# Patient Record
Sex: Female | Born: 1935 | Race: Black or African American | Hispanic: No | Smoking: Never smoker
Health system: Southern US, Community
[De-identification: ages and names within clinical notes are randomized; demographics above are authoritative.]

## PROBLEM LIST (undated history)

## (undated) ENCOUNTER — Encounter

## (undated) ENCOUNTER — Ambulatory Visit: Payer: MEDICARE

## (undated) ENCOUNTER — Telehealth: Attending: Critical Care Medicine | Primary: Critical Care Medicine

## (undated) ENCOUNTER — Ambulatory Visit: Payer: MEDICARE | Attending: Audiologist | Primary: Audiologist

## (undated) ENCOUNTER — Ambulatory Visit

## (undated) ENCOUNTER — Encounter: Attending: Family Medicine | Primary: Family Medicine

## (undated) ENCOUNTER — Telehealth: Attending: Hematology & Oncology | Primary: Hematology & Oncology

## (undated) ENCOUNTER — Encounter
Attending: Pharmacist Clinician (PhC)/ Clinical Pharmacy Specialist | Primary: Pharmacist Clinician (PhC)/ Clinical Pharmacy Specialist

## (undated) ENCOUNTER — Ambulatory Visit: Payer: MEDICARE | Attending: Hematology & Oncology | Primary: Hematology & Oncology

## (undated) ENCOUNTER — Ambulatory Visit: Payer: MEDICARE | Attending: Clinical | Primary: Clinical

## (undated) ENCOUNTER — Telehealth: Attending: Geriatric Medicine | Primary: Geriatric Medicine

## (undated) ENCOUNTER — Telehealth: Attending: Research Study | Primary: Research Study

## (undated) ENCOUNTER — Ambulatory Visit: Payer: MEDICARE | Attending: Geriatric Medicine | Primary: Geriatric Medicine

## (undated) ENCOUNTER — Telehealth

## (undated) ENCOUNTER — Ambulatory Visit: Payer: MEDICARE | Attending: Family Medicine | Primary: Family Medicine

## (undated) ENCOUNTER — Telehealth
Attending: Pharmacist Clinician (PhC)/ Clinical Pharmacy Specialist | Primary: Pharmacist Clinician (PhC)/ Clinical Pharmacy Specialist

## (undated) ENCOUNTER — Ambulatory Visit: Payer: MEDICARE | Attending: Speech-Language Pathologist | Primary: Speech-Language Pathologist

## (undated) ENCOUNTER — Encounter: Attending: Hematology & Oncology | Primary: Hematology & Oncology

## (undated) ENCOUNTER — Encounter: Attending: Pulmonary Disease | Primary: Pulmonary Disease

## (undated) ENCOUNTER — Encounter: Attending: Geriatric Medicine | Primary: Geriatric Medicine

## (undated) ENCOUNTER — Ambulatory Visit: Payer: MEDICARE | Attending: Otolaryngology | Primary: Otolaryngology

## (undated) ENCOUNTER — Ambulatory Visit: Payer: MEDICARE | Attending: Registered" | Primary: Registered"

## (undated) ENCOUNTER — Telehealth: Attending: Family Medicine | Primary: Family Medicine

## (undated) ENCOUNTER — Telehealth: Attending: Internal Medicine | Primary: Internal Medicine

## (undated) ENCOUNTER — Ambulatory Visit: Attending: Clinical | Primary: Clinical

## (undated) ENCOUNTER — Ambulatory Visit: Payer: MEDICARE | Attending: Nurse Practitioner | Primary: Nurse Practitioner

## (undated) ENCOUNTER — Telehealth
Attending: Student in an Organized Health Care Education/Training Program | Primary: Student in an Organized Health Care Education/Training Program

## (undated) ENCOUNTER — Encounter: Attending: Critical Care Medicine | Primary: Critical Care Medicine

## (undated) ENCOUNTER — Inpatient Hospital Stay

## (undated) ENCOUNTER — Ambulatory Visit
Payer: MEDICARE | Attending: Student in an Organized Health Care Education/Training Program | Primary: Student in an Organized Health Care Education/Training Program

## (undated) ENCOUNTER — Telehealth: Attending: Urology | Primary: Urology

## (undated) ENCOUNTER — Telehealth: Attending: Pulmonary Disease | Primary: Pulmonary Disease

## (undated) DIAGNOSIS — E119 Type 2 diabetes mellitus without complications: Secondary | ICD-10-CM

## (undated) DIAGNOSIS — I10 Essential (primary) hypertension: Secondary | ICD-10-CM

## (undated) DIAGNOSIS — N63 Unspecified lump in unspecified breast: Secondary | ICD-10-CM

## (undated) DIAGNOSIS — E538 Deficiency of other specified B group vitamins: Secondary | ICD-10-CM

## (undated) HISTORY — PX: CYST REMOVAL NECK: SHX6281

## (undated) HISTORY — PX: CHOLECYSTECTOMY: SHX55

## (undated) HISTORY — DX: Deficiency of other specified B group vitamins: E53.8

## (undated) HISTORY — DX: Unspecified lump in unspecified breast: N63.0

## (undated) HISTORY — DX: Essential (primary) hypertension: I10

## (undated) HISTORY — DX: Type 2 diabetes mellitus without complications: E11.9

## (undated) HISTORY — PX: ABDOMINAL HYSTERECTOMY: SHX81

---
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
## 2011-10-17 DIAGNOSIS — E119 Type 2 diabetes mellitus without complications: Secondary | ICD-10-CM | POA: Diagnosis not present

## 2011-10-17 DIAGNOSIS — I10 Essential (primary) hypertension: Secondary | ICD-10-CM | POA: Diagnosis not present

## 2011-11-14 DIAGNOSIS — E119 Type 2 diabetes mellitus without complications: Secondary | ICD-10-CM | POA: Diagnosis not present

## 2011-11-14 DIAGNOSIS — I10 Essential (primary) hypertension: Secondary | ICD-10-CM | POA: Diagnosis not present

## 2011-12-20 DIAGNOSIS — R05 Cough: Secondary | ICD-10-CM | POA: Diagnosis not present

## 2012-05-09 DIAGNOSIS — R002 Palpitations: Secondary | ICD-10-CM | POA: Diagnosis not present

## 2012-05-09 LAB — BASIC METABOLIC PANEL
BUN: 18 mg/dL (ref 4–21)
CREATININE: 0.7 mg/dL (ref <=1.1)

## 2012-08-05 ENCOUNTER — Emergency Department: Payer: Self-pay | Admitting: Emergency Medicine

## 2012-08-05 DIAGNOSIS — I10 Essential (primary) hypertension: Secondary | ICD-10-CM | POA: Diagnosis not present

## 2012-08-05 DIAGNOSIS — E119 Type 2 diabetes mellitus without complications: Secondary | ICD-10-CM | POA: Diagnosis not present

## 2012-08-05 DIAGNOSIS — S6980XA Other specified injuries of unspecified wrist, hand and finger(s), initial encounter: Secondary | ICD-10-CM | POA: Diagnosis not present

## 2012-08-05 DIAGNOSIS — L089 Local infection of the skin and subcutaneous tissue, unspecified: Secondary | ICD-10-CM | POA: Diagnosis not present

## 2012-08-05 DIAGNOSIS — M79609 Pain in unspecified limb: Secondary | ICD-10-CM | POA: Diagnosis not present

## 2012-12-05 DIAGNOSIS — R634 Abnormal weight loss: Secondary | ICD-10-CM | POA: Diagnosis not present

## 2012-12-05 DIAGNOSIS — E119 Type 2 diabetes mellitus without complications: Secondary | ICD-10-CM | POA: Diagnosis not present

## 2012-12-05 DIAGNOSIS — I10 Essential (primary) hypertension: Secondary | ICD-10-CM | POA: Diagnosis not present

## 2012-12-11 DIAGNOSIS — Z23 Encounter for immunization: Secondary | ICD-10-CM | POA: Diagnosis not present

## 2012-12-21 DIAGNOSIS — L509 Urticaria, unspecified: Secondary | ICD-10-CM | POA: Diagnosis not present

## 2013-01-30 DIAGNOSIS — T148 Other injury of unspecified body region: Secondary | ICD-10-CM | POA: Diagnosis not present

## 2013-04-01 DIAGNOSIS — IMO0001 Reserved for inherently not codable concepts without codable children: Secondary | ICD-10-CM | POA: Diagnosis not present

## 2013-07-08 DIAGNOSIS — I10 Essential (primary) hypertension: Secondary | ICD-10-CM | POA: Diagnosis not present

## 2013-07-08 DIAGNOSIS — R634 Abnormal weight loss: Secondary | ICD-10-CM | POA: Diagnosis not present

## 2013-07-08 DIAGNOSIS — IMO0001 Reserved for inherently not codable concepts without codable children: Secondary | ICD-10-CM | POA: Diagnosis not present

## 2013-07-08 LAB — HEMOGLOBIN A1C: Hgb A1c MFr Bld: 8.1 % — AB (ref 4.0–6.0)

## 2013-12-03 DIAGNOSIS — N63 Unspecified lump in breast: Secondary | ICD-10-CM | POA: Diagnosis not present

## 2013-12-03 DIAGNOSIS — D171 Benign lipomatous neoplasm of skin and subcutaneous tissue of trunk: Secondary | ICD-10-CM | POA: Diagnosis not present

## 2013-12-03 DIAGNOSIS — Z0000 Encounter for general adult medical examination without abnormal findings: Secondary | ICD-10-CM | POA: Diagnosis not present

## 2013-12-03 DIAGNOSIS — L729 Follicular cyst of the skin and subcutaneous tissue, unspecified: Secondary | ICD-10-CM | POA: Diagnosis not present

## 2013-12-03 DIAGNOSIS — D510 Vitamin B12 deficiency anemia due to intrinsic factor deficiency: Secondary | ICD-10-CM | POA: Diagnosis not present

## 2013-12-03 DIAGNOSIS — E1165 Type 2 diabetes mellitus with hyperglycemia: Secondary | ICD-10-CM | POA: Diagnosis not present

## 2013-12-03 DIAGNOSIS — I10 Essential (primary) hypertension: Secondary | ICD-10-CM | POA: Diagnosis not present

## 2013-12-03 DIAGNOSIS — Z79899 Other long term (current) drug therapy: Secondary | ICD-10-CM | POA: Diagnosis not present

## 2014-01-03 DIAGNOSIS — E1165 Type 2 diabetes mellitus with hyperglycemia: Secondary | ICD-10-CM | POA: Diagnosis not present

## 2014-01-03 DIAGNOSIS — R634 Abnormal weight loss: Secondary | ICD-10-CM | POA: Diagnosis not present

## 2014-01-03 DIAGNOSIS — E119 Type 2 diabetes mellitus without complications: Secondary | ICD-10-CM | POA: Insufficient documentation

## 2014-01-31 DIAGNOSIS — E119 Type 2 diabetes mellitus without complications: Secondary | ICD-10-CM | POA: Diagnosis not present

## 2014-01-31 DIAGNOSIS — E11649 Type 2 diabetes mellitus with hypoglycemia without coma: Secondary | ICD-10-CM | POA: Diagnosis not present

## 2014-01-31 DIAGNOSIS — E876 Hypokalemia: Secondary | ICD-10-CM | POA: Diagnosis not present

## 2014-01-31 DIAGNOSIS — R634 Abnormal weight loss: Secondary | ICD-10-CM | POA: Diagnosis not present

## 2014-03-27 DIAGNOSIS — Z794 Long term (current) use of insulin: Secondary | ICD-10-CM | POA: Diagnosis not present

## 2014-03-27 DIAGNOSIS — E119 Type 2 diabetes mellitus without complications: Secondary | ICD-10-CM | POA: Diagnosis not present

## 2014-04-04 DIAGNOSIS — E11649 Type 2 diabetes mellitus with hypoglycemia without coma: Secondary | ICD-10-CM | POA: Diagnosis not present

## 2014-04-04 DIAGNOSIS — R634 Abnormal weight loss: Secondary | ICD-10-CM | POA: Diagnosis not present

## 2014-04-04 DIAGNOSIS — E119 Type 2 diabetes mellitus without complications: Secondary | ICD-10-CM | POA: Diagnosis not present

## 2014-04-04 DIAGNOSIS — E876 Hypokalemia: Secondary | ICD-10-CM | POA: Diagnosis not present

## 2014-04-17 DIAGNOSIS — M25561 Pain in right knee: Secondary | ICD-10-CM | POA: Diagnosis not present

## 2014-04-17 DIAGNOSIS — M1711 Unilateral primary osteoarthritis, right knee: Secondary | ICD-10-CM | POA: Diagnosis not present

## 2014-05-05 ENCOUNTER — Ambulatory Visit: Payer: Self-pay | Admitting: Internal Medicine

## 2014-05-05 DIAGNOSIS — N63 Unspecified lump in breast: Secondary | ICD-10-CM | POA: Diagnosis not present

## 2014-05-08 DIAGNOSIS — Z794 Long term (current) use of insulin: Secondary | ICD-10-CM | POA: Diagnosis not present

## 2014-05-08 DIAGNOSIS — E876 Hypokalemia: Secondary | ICD-10-CM | POA: Diagnosis not present

## 2014-05-08 DIAGNOSIS — R634 Abnormal weight loss: Secondary | ICD-10-CM | POA: Diagnosis not present

## 2014-05-08 DIAGNOSIS — E1165 Type 2 diabetes mellitus with hyperglycemia: Secondary | ICD-10-CM | POA: Diagnosis not present

## 2014-06-12 DIAGNOSIS — E1165 Type 2 diabetes mellitus with hyperglycemia: Secondary | ICD-10-CM | POA: Diagnosis not present

## 2014-07-09 DIAGNOSIS — E1165 Type 2 diabetes mellitus with hyperglycemia: Secondary | ICD-10-CM | POA: Diagnosis not present

## 2014-07-09 DIAGNOSIS — Z794 Long term (current) use of insulin: Secondary | ICD-10-CM | POA: Diagnosis not present

## 2014-07-09 DIAGNOSIS — E876 Hypokalemia: Secondary | ICD-10-CM | POA: Diagnosis not present

## 2014-07-09 DIAGNOSIS — Z79899 Other long term (current) drug therapy: Secondary | ICD-10-CM | POA: Diagnosis not present

## 2014-09-29 ENCOUNTER — Other Ambulatory Visit: Payer: Self-pay | Admitting: Internal Medicine

## 2014-10-02 DIAGNOSIS — E1165 Type 2 diabetes mellitus with hyperglycemia: Secondary | ICD-10-CM | POA: Diagnosis not present

## 2014-10-02 DIAGNOSIS — R634 Abnormal weight loss: Secondary | ICD-10-CM | POA: Diagnosis not present

## 2014-10-07 DIAGNOSIS — I10 Essential (primary) hypertension: Secondary | ICD-10-CM | POA: Diagnosis not present

## 2014-10-07 DIAGNOSIS — R634 Abnormal weight loss: Secondary | ICD-10-CM | POA: Diagnosis not present

## 2014-10-07 DIAGNOSIS — E1165 Type 2 diabetes mellitus with hyperglycemia: Secondary | ICD-10-CM | POA: Diagnosis not present

## 2014-10-07 DIAGNOSIS — Z794 Long term (current) use of insulin: Secondary | ICD-10-CM | POA: Diagnosis not present

## 2014-11-25 DIAGNOSIS — I10 Essential (primary) hypertension: Secondary | ICD-10-CM | POA: Diagnosis not present

## 2014-11-25 DIAGNOSIS — E1165 Type 2 diabetes mellitus with hyperglycemia: Secondary | ICD-10-CM | POA: Diagnosis not present

## 2014-11-25 DIAGNOSIS — R634 Abnormal weight loss: Secondary | ICD-10-CM | POA: Diagnosis not present

## 2014-11-25 DIAGNOSIS — Z794 Long term (current) use of insulin: Secondary | ICD-10-CM | POA: Diagnosis not present

## 2014-11-29 IMAGING — CR RIGHT THUMB 2+V
1 series · 3 of 3 positions shown · non-contrast
Comparison: none

REASON FOR EXAM: pain in thumb; possible Kethellen Cocco in thumb 2 weeks
ago
COMMENTS:

PROCEDURE:     DXR - DXR THUMB RIGHT HAND (1ST DIGIT)  - August 05, 2012 [DATE]
RESULT:     Comparison:  None

[Series 1: x finger pa right · 0.14mm/px · 3 of 3 slices shown]
[im 1/3]
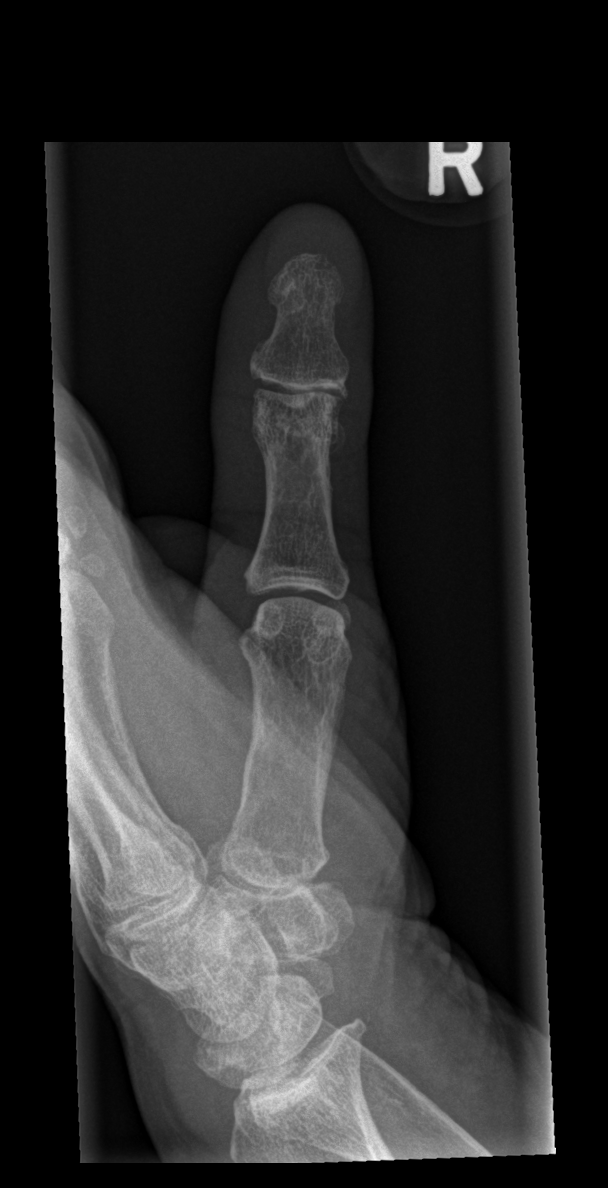
[im 2/3]
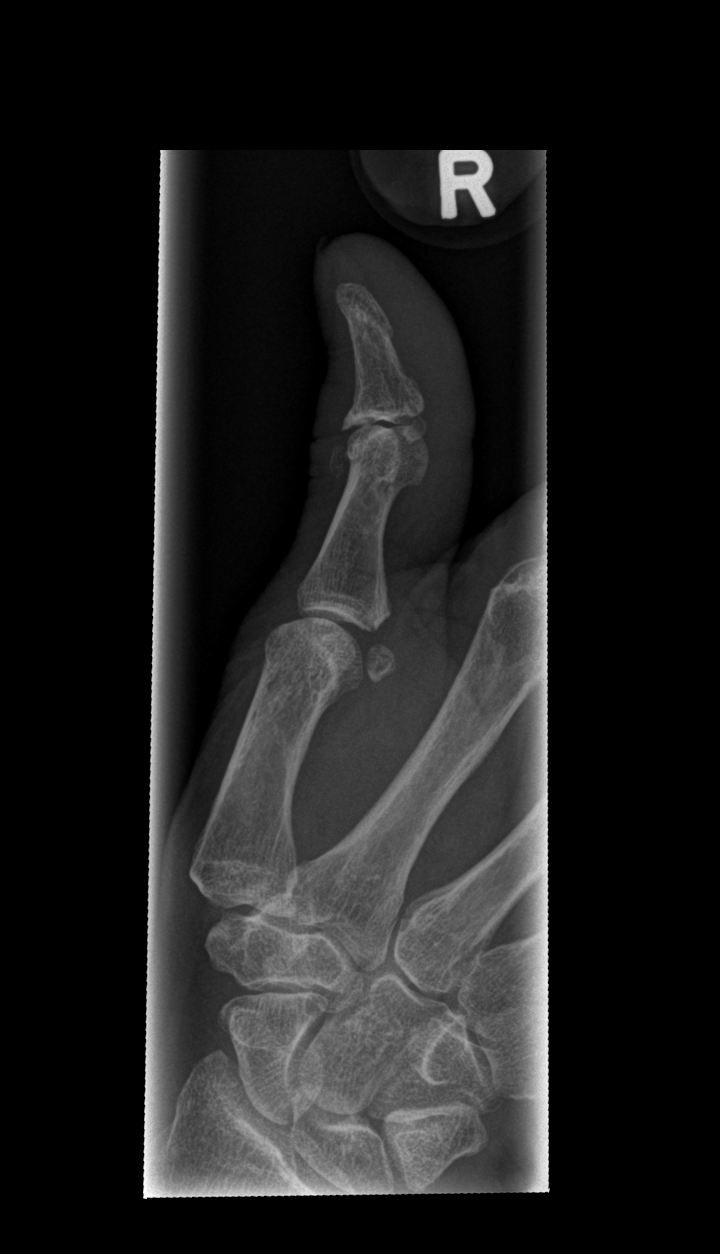
[im 3/3]
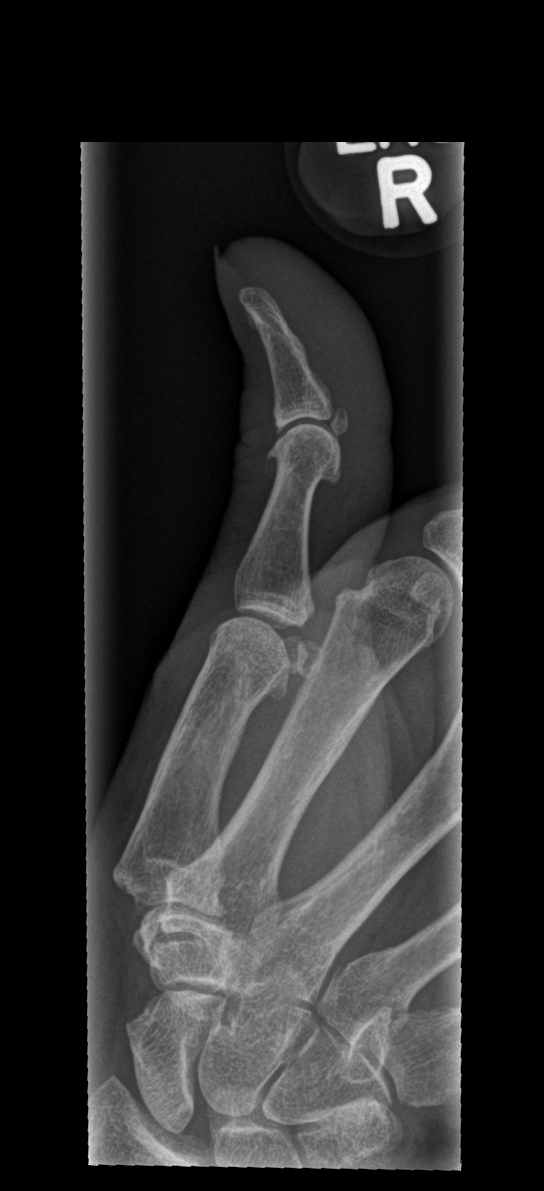

[3 of 3 positions shown; findings below may reference images not displayed]

FINDINGS: Three coned-down views of the right thumb demonstrates no fracture or
dislocation. The soft tissues are normal. There are degenerative changes of
the IP joint.
IMPRESSION: No acute osseous injury of the right.

[REDACTED]

## 2014-12-16 DIAGNOSIS — E1165 Type 2 diabetes mellitus with hyperglycemia: Secondary | ICD-10-CM | POA: Diagnosis not present

## 2014-12-16 DIAGNOSIS — I10 Essential (primary) hypertension: Secondary | ICD-10-CM | POA: Diagnosis not present

## 2014-12-16 DIAGNOSIS — E876 Hypokalemia: Secondary | ICD-10-CM | POA: Diagnosis not present

## 2014-12-16 DIAGNOSIS — Z794 Long term (current) use of insulin: Secondary | ICD-10-CM | POA: Diagnosis not present

## 2014-12-16 DIAGNOSIS — Z79899 Other long term (current) drug therapy: Secondary | ICD-10-CM | POA: Diagnosis not present

## 2014-12-16 DIAGNOSIS — R634 Abnormal weight loss: Secondary | ICD-10-CM | POA: Diagnosis not present

## 2014-12-22 ENCOUNTER — Other Ambulatory Visit: Payer: Self-pay | Admitting: Internal Medicine

## 2014-12-22 ENCOUNTER — Encounter: Payer: Self-pay | Admitting: Internal Medicine

## 2014-12-22 DIAGNOSIS — I10 Essential (primary) hypertension: Secondary | ICD-10-CM | POA: Insufficient documentation

## 2014-12-22 DIAGNOSIS — D510 Vitamin B12 deficiency anemia due to intrinsic factor deficiency: Secondary | ICD-10-CM | POA: Insufficient documentation

## 2014-12-22 DIAGNOSIS — D171 Benign lipomatous neoplasm of skin and subcutaneous tissue of trunk: Secondary | ICD-10-CM | POA: Insufficient documentation

## 2014-12-22 DIAGNOSIS — E1165 Type 2 diabetes mellitus with hyperglycemia: Secondary | ICD-10-CM | POA: Insufficient documentation

## 2014-12-22 DIAGNOSIS — N6310 Unspecified lump in the right breast, unspecified quadrant: Secondary | ICD-10-CM | POA: Insufficient documentation

## 2014-12-26 ENCOUNTER — Other Ambulatory Visit: Payer: Self-pay

## 2014-12-31 ENCOUNTER — Ambulatory Visit (INDEPENDENT_AMBULATORY_CARE_PROVIDER_SITE_OTHER): Payer: Medicare Other | Admitting: Internal Medicine

## 2014-12-31 ENCOUNTER — Encounter: Payer: Self-pay | Admitting: Internal Medicine

## 2014-12-31 ENCOUNTER — Other Ambulatory Visit: Payer: Self-pay

## 2014-12-31 VITALS — BP 158/80 | HR 80 | Ht 64.0 in | Wt 131.6 lb

## 2014-12-31 DIAGNOSIS — E1165 Type 2 diabetes mellitus with hyperglycemia: Secondary | ICD-10-CM | POA: Diagnosis not present

## 2014-12-31 DIAGNOSIS — Z794 Long term (current) use of insulin: Secondary | ICD-10-CM

## 2014-12-31 DIAGNOSIS — N63 Unspecified lump in unspecified breast: Secondary | ICD-10-CM

## 2014-12-31 DIAGNOSIS — I10 Essential (primary) hypertension: Secondary | ICD-10-CM

## 2014-12-31 MED ORDER — LOSARTAN POTASSIUM-HCTZ 100-25 MG PO TABS: 1.0000 | ORAL_TABLET | Freq: Every day | ORAL | Status: DC

## 2014-12-31 NOTE — Progress Notes (Signed)
Date:  12/31/2014   Name:  Victoria Alexander   DOB:  [birth year]   MRN:  [id number]   Chief Complaint: Diabetes and Hypertension Diabetes She presents for her follow-up (Patient is being followed by endocrinology Dr. Eddie Dibbles.) diabetic visit. She has type 2 diabetes mellitus. Her disease course has been fluctuating. Pertinent negatives for hypoglycemia include no confusion (family states that her mind is sharp) or headaches. Pertinent negatives for diabetes include no chest pain and no fatigue. There are no hypoglycemic complications. Current diabetic treatments: Insulin dose changed to 70/30 twice a day with no oral agents. She is compliant with treatment most of the time (Patient is slightly confused about her therapy. When asked if she takes insulin twice a day she says yes.). Her weight is stable. Her home blood glucose trend is increasing steadily. An ACE inhibitor/angiotensin II receptor blocker is being taken.  Hypertension This is a chronic problem. The current episode started more than 1 year ago. The problem is unchanged. The problem is controlled. Pertinent negatives include no chest pain, headaches, palpitations or shortness of breath. Risk factors for coronary artery disease include diabetes mellitus and dyslipidemia. Past treatments include angiotensin blockers, diuretics and calcium channel blockers. The current treatment provides moderate improvement. Compliance problems: Patient is uncertain what medication she takes. Several family members help monitor her medications so it's unclear if there is a consistent person following up.    Breast mass -Patient was noted to have a right breast mass last year. She finally had a mammogram in March 2016. At that time they will requested additional views with ultrasound and possible biopsy. However the patient left without having the procedure performed. She states that she is not worried about the mass. She states that the mass has not changed. She  denies pain, skin change or other problems in that area. She is adamant that she does not want further evaluation at this time.    Review of Systems  Constitutional: Negative for fever and fatigue.  HENT: Negative for hearing loss, tinnitus and trouble swallowing.   Eyes: Negative for visual disturbance.  Respiratory: Negative for cough, chest tightness and shortness of breath.   Cardiovascular: Negative for chest pain, palpitations and leg swelling.  Gastrointestinal: Negative for abdominal pain, diarrhea and constipation.  Genitourinary: Negative for dysuria and hematuria.       Persistent mass in the right upper breast reportedly unchanged  Musculoskeletal: Negative for arthralgias.  Neurological: Negative for headaches.  Hematological: Negative for adenopathy.  Psychiatric/Behavioral: Negative for confusion (family states that her mind is sharp) and dysphoric mood.    Patient Active Problem List   Diagnosis Date Noted  . Addison anemia 12/22/2014  . Breast lump 12/22/2014  . Type 2 diabetes mellitus with hyperglycemia ([place]) 12/22/2014  . Essential (primary) hypertension 12/22/2014  . Lipoma of back 12/22/2014    Prior to Admission medications   Medication Sig Start Date End Date Taking? Authorizing Provider  amLODipine (NORVASC) 2.5 MG tablet TAKE 1 TABLET (2.5 MG TOTAL) BY MOUTH ONCE DAILY. 12/17/14  Yes Historical Provider, MD  B-D ULTRAFINE III SHORT PEN 31G X 8 MM MISC USE AS DIRECTED. USE 4 NEEDLES A DAY WITH DIABETIC MEDICATION PENS AS DIRECTED 10/07/14  Yes Historical Provider, MD  glimepiride (AMARYL) 2 MG tablet Take 1 tablet by mouth 2 (two) times daily. 12/03/13  Yes Historical Provider, MD  GLUCOSE BLOOD VI  03/01/14  Yes Historical Provider, MD  Insulin Detemir (LEVEMIR FLEXTOUCH) 100 UNIT/ML Pen Inject 12  Units into the skin daily. 07/16/14  Yes Historical Provider, MD  aspirin 81 MG chewable tablet Chew 1 tablet by mouth daily.    Historical Provider, MD   losartan-hydrochlorothiazide (HYZAAR) 100-25 MG tablet TAKE 1 TABLET EVERY DAY 12/22/14   Glean Hess, MD  NOVOLOG MIX 70/30 FLEXPEN (70-30) 100 UNIT/ML FlexPen TAKE 10 UNITS 30 MINUTES BEFORE BREAKFAST AND 8 UNITS 30 MINUTES BEFORE SUPPER. 12/16/14   Historical Provider, MD  potassium chloride (K-DUR,KLOR-CON) 10 MEQ tablet Take 2 tablets by mouth daily. 05/09/12   Historical Provider, MD    Allergies  Allergen Reactions  . Sulfa Antibiotics Nausea Only    Past Surgical History  Procedure Laterality Date  . Abdominal hysterectomy    . Cholecystectomy      Social History  Substance Use Topics  . Smoking status: Never Smoker   . Smokeless tobacco: None  . Alcohol Use: No     Medication list has been reviewed and updated.   Physical Exam  Constitutional: She is oriented to person, place, and time. She appears well-developed and well-nourished.  Neck: Normal range of motion. Neck supple. No thyromegaly present.  Cardiovascular: Normal rate, regular rhythm and normal heart sounds.   Pulmonary/Chest: Effort normal and breath sounds normal. She has no wheezes. She has no rales.  Right breast mass at 11:00. Firm, well-circumscribed and nontender. Proximately 1.5 cm in diameter.   Abdominal: Soft. Bowel sounds are normal. She exhibits no mass. There is no tenderness.  Musculoskeletal: She exhibits no edema or tenderness.  Neurological: She is alert and oriented to person, place, and time.  Psychiatric: She has a normal mood and affect.  Nursing note and vitals reviewed.   BP 176/76 mmHg  Pulse 80  Ht '5\' 4"'$  (1.626 m)  Wt 131 lb 9.6 oz (59.693 kg)  BMI 22.58 kg/m2  Assessment and Plan: 1. Essential (primary) hypertension Only fair control but question medication compliance Enforced need to take both medications every day - TSH - CBC with Differential/Platelet  2. Breast lump Patient continues to decline further evaluation of her breast mass  3. Type 2 diabetes  mellitus with hyperglycemia, with long-term current use of insulin ([place]) Followed by endocrinology Continue insulin twice a day as recommended - Comprehensive metabolic panel   Halina Maidens, MD [facility]  12/31/2014

## 2015-01-01 ENCOUNTER — Other Ambulatory Visit: Payer: Self-pay | Admitting: Internal Medicine

## 2015-01-01 LAB — COMPREHENSIVE METABOLIC PANEL
A/G RATIO: 1.6 (ref 1.1–2.5)
ALT: 7 IU/L (ref 0–32)
AST: 14 IU/L (ref 0–40)
Albumin: 4.2 g/dL (ref 3.5–4.7)
Alkaline Phosphatase: 71 IU/L (ref 39–117)
BUN/Creatinine Ratio: 21 (ref 11–26)
BUN: 15 mg/dL (ref 8–27)
Bilirubin Total: 1.1 mg/dL (ref 0.0–1.2)
CALCIUM: 9.9 mg/dL (ref 8.7–10.3)
CO2: 32 mmol/L — AB (ref 18–29)
CREATININE: 0.72 mg/dL (ref 0.57–1.00)
Chloride: 95 mmol/L — ABNORMAL LOW (ref 97–106)
GFR, EST AFRICAN AMERICAN: 91 mL/min/{1.73_m2} (ref >59)
GFR, EST NON AFRICAN AMERICAN: 79 mL/min/{1.73_m2} (ref >59)
GLUCOSE: 347 mg/dL — AB (ref 65–99)
Globulin, Total: 2.6 g/dL (ref 1.5–4.5)
Potassium: 3.1 mmol/L — ABNORMAL LOW (ref 3.5–5.2)
Sodium: 140 mmol/L (ref 136–144)
TOTAL PROTEIN: 6.8 g/dL (ref 6.0–8.5)

## 2015-01-01 LAB — CBC WITH DIFFERENTIAL/PLATELET
BASOS: 1 %
Basophils Absolute: 0 10*3/uL (ref 0.0–0.2)
EOS (ABSOLUTE): 0.2 10*3/uL (ref 0.0–0.4)
Eos: 3 %
Hematocrit: 33.8 % — ABNORMAL LOW (ref 34.0–46.6)
Hemoglobin: 11.5 g/dL (ref 11.1–15.9)
IMMATURE GRANS (ABS): 0 10*3/uL (ref 0.0–0.1)
IMMATURE GRANULOCYTES: 0 %
LYMPHS: 28 %
Lymphocytes Absolute: 1.8 10*3/uL (ref 0.7–3.1)
MCH: 23.1 pg — ABNORMAL LOW (ref 26.6–33.0)
MCHC: 34 g/dL (ref 31.5–35.7)
MCV: 68 fL — AB (ref 79–97)
Monocytes Absolute: 0.1 10*3/uL (ref 0.1–0.9)
Monocytes: 1 %
NEUTROS PCT: 67 %
Neutrophils Absolute: 4.4 10*3/uL (ref 1.4–7.0)
PLATELETS: 165 10*3/uL (ref 150–379)
RBC: 4.97 x10E6/uL (ref 3.77–5.28)
RDW: 18.6 % — AB (ref 12.3–15.4)
WBC: 6.4 10*3/uL (ref 3.4–10.8)

## 2015-01-01 LAB — TSH: TSH: 3.65 u[IU]/mL (ref 0.450–4.500)

## 2015-01-01 MED ORDER — POTASSIUM CHLORIDE CRYS ER 20 MEQ PO TBCR: 20.0000 meq | EXTENDED_RELEASE_TABLET | Freq: Every day | ORAL | Status: DC

## 2015-01-12 ENCOUNTER — Other Ambulatory Visit: Payer: Self-pay | Admitting: Internal Medicine

## 2015-02-05 DIAGNOSIS — Z794 Long term (current) use of insulin: Secondary | ICD-10-CM | POA: Diagnosis not present

## 2015-02-05 DIAGNOSIS — E1165 Type 2 diabetes mellitus with hyperglycemia: Secondary | ICD-10-CM | POA: Diagnosis not present

## 2015-02-13 DIAGNOSIS — E119 Type 2 diabetes mellitus without complications: Secondary | ICD-10-CM | POA: Diagnosis not present

## 2015-02-24 ENCOUNTER — Encounter: Payer: Self-pay | Admitting: Internal Medicine

## 2015-04-02 DIAGNOSIS — E876 Hypokalemia: Secondary | ICD-10-CM | POA: Diagnosis not present

## 2015-04-02 DIAGNOSIS — E1165 Type 2 diabetes mellitus with hyperglycemia: Secondary | ICD-10-CM | POA: Diagnosis not present

## 2015-04-02 DIAGNOSIS — I10 Essential (primary) hypertension: Secondary | ICD-10-CM | POA: Diagnosis not present

## 2015-04-02 DIAGNOSIS — Z794 Long term (current) use of insulin: Secondary | ICD-10-CM | POA: Diagnosis not present

## 2015-04-02 DIAGNOSIS — R634 Abnormal weight loss: Secondary | ICD-10-CM | POA: Diagnosis not present

## 2015-04-10 DIAGNOSIS — E1165 Type 2 diabetes mellitus with hyperglycemia: Secondary | ICD-10-CM | POA: Diagnosis not present

## 2015-04-10 DIAGNOSIS — R634 Abnormal weight loss: Secondary | ICD-10-CM | POA: Diagnosis not present

## 2015-04-10 DIAGNOSIS — I10 Essential (primary) hypertension: Secondary | ICD-10-CM | POA: Diagnosis not present

## 2015-04-10 DIAGNOSIS — Z794 Long term (current) use of insulin: Secondary | ICD-10-CM | POA: Diagnosis not present

## 2015-04-14 ENCOUNTER — Encounter: Payer: Medicare Other | Admitting: Internal Medicine

## 2015-04-21 DIAGNOSIS — Z794 Long term (current) use of insulin: Secondary | ICD-10-CM | POA: Diagnosis not present

## 2015-04-21 DIAGNOSIS — E1165 Type 2 diabetes mellitus with hyperglycemia: Secondary | ICD-10-CM | POA: Diagnosis not present

## 2015-04-21 DIAGNOSIS — I10 Essential (primary) hypertension: Secondary | ICD-10-CM | POA: Diagnosis not present

## 2015-05-08 ENCOUNTER — Encounter: Payer: Self-pay | Admitting: *Deleted

## 2015-05-28 ENCOUNTER — Other Ambulatory Visit: Payer: Self-pay

## 2015-05-28 MED ORDER — POTASSIUM CHLORIDE CRYS ER 20 MEQ PO TBCR: 20.0000 meq | EXTENDED_RELEASE_TABLET | Freq: Every day | ORAL | Status: DC

## 2015-05-28 MED ORDER — LOSARTAN POTASSIUM-HCTZ 100-25 MG PO TABS: 1.0000 | ORAL_TABLET | Freq: Every day | ORAL | Status: DC

## 2015-05-28 NOTE — Telephone Encounter (Signed)
Received fax from pharmacy for 90 day supply.

## 2015-05-28 NOTE — Telephone Encounter (Signed)
Pts coming in on 7/5 for cpe

## 2015-06-03 ENCOUNTER — Other Ambulatory Visit: Payer: Self-pay

## 2015-06-03 MED ORDER — LOSARTAN POTASSIUM-HCTZ 100-25 MG PO TABS
1.0000 | ORAL_TABLET | Freq: Every day | ORAL | Status: DC
Start: 2015-06-03 — End: 2015-09-01

## 2015-06-03 MED ORDER — POTASSIUM CHLORIDE CRYS ER 20 MEQ PO TBCR: 20.0000 meq | EXTENDED_RELEASE_TABLET | Freq: Every day | ORAL | Status: DC

## 2015-06-03 NOTE — Telephone Encounter (Signed)
Received fax from pharmacy requesting medication for 90 day supply.

## 2015-07-14 DIAGNOSIS — Z794 Long term (current) use of insulin: Secondary | ICD-10-CM | POA: Diagnosis not present

## 2015-07-14 DIAGNOSIS — E1165 Type 2 diabetes mellitus with hyperglycemia: Secondary | ICD-10-CM | POA: Diagnosis not present

## 2015-07-21 DIAGNOSIS — Z794 Long term (current) use of insulin: Secondary | ICD-10-CM | POA: Diagnosis not present

## 2015-07-21 DIAGNOSIS — E1165 Type 2 diabetes mellitus with hyperglycemia: Secondary | ICD-10-CM | POA: Diagnosis not present

## 2015-07-21 DIAGNOSIS — I10 Essential (primary) hypertension: Secondary | ICD-10-CM | POA: Diagnosis not present

## 2015-07-21 DIAGNOSIS — E876 Hypokalemia: Secondary | ICD-10-CM | POA: Diagnosis not present

## 2015-07-21 DIAGNOSIS — R634 Abnormal weight loss: Secondary | ICD-10-CM | POA: Diagnosis not present

## 2015-07-28 DIAGNOSIS — E876 Hypokalemia: Secondary | ICD-10-CM | POA: Diagnosis not present

## 2015-07-28 DIAGNOSIS — E1165 Type 2 diabetes mellitus with hyperglycemia: Secondary | ICD-10-CM | POA: Diagnosis not present

## 2015-07-28 DIAGNOSIS — E11641 Type 2 diabetes mellitus with hypoglycemia with coma: Secondary | ICD-10-CM | POA: Diagnosis not present

## 2015-07-28 DIAGNOSIS — Z79899 Other long term (current) drug therapy: Secondary | ICD-10-CM | POA: Diagnosis not present

## 2015-07-28 DIAGNOSIS — Z794 Long term (current) use of insulin: Secondary | ICD-10-CM | POA: Diagnosis not present

## 2015-07-28 DIAGNOSIS — I10 Essential (primary) hypertension: Secondary | ICD-10-CM | POA: Diagnosis not present

## 2015-07-28 DIAGNOSIS — N390 Urinary tract infection, site not specified: Secondary | ICD-10-CM | POA: Diagnosis not present

## 2015-07-28 DIAGNOSIS — I4510 Unspecified right bundle-branch block: Secondary | ICD-10-CM | POA: Diagnosis not present

## 2015-07-28 DIAGNOSIS — E162 Hypoglycemia, unspecified: Secondary | ICD-10-CM | POA: Diagnosis not present

## 2015-08-18 DIAGNOSIS — E1165 Type 2 diabetes mellitus with hyperglycemia: Secondary | ICD-10-CM | POA: Diagnosis not present

## 2015-08-18 DIAGNOSIS — I10 Essential (primary) hypertension: Secondary | ICD-10-CM | POA: Diagnosis not present

## 2015-08-18 DIAGNOSIS — E876 Hypokalemia: Secondary | ICD-10-CM | POA: Diagnosis not present

## 2015-08-18 DIAGNOSIS — E11641 Type 2 diabetes mellitus with hypoglycemia with coma: Secondary | ICD-10-CM | POA: Diagnosis not present

## 2015-08-18 DIAGNOSIS — Z794 Long term (current) use of insulin: Secondary | ICD-10-CM | POA: Diagnosis not present

## 2015-09-01 ENCOUNTER — Other Ambulatory Visit: Payer: Self-pay | Admitting: Internal Medicine

## 2015-09-02 ENCOUNTER — Encounter: Payer: Medicare Other | Admitting: Internal Medicine

## 2015-09-08 DIAGNOSIS — E1165 Type 2 diabetes mellitus with hyperglycemia: Secondary | ICD-10-CM | POA: Diagnosis not present

## 2015-09-08 DIAGNOSIS — I10 Essential (primary) hypertension: Secondary | ICD-10-CM | POA: Diagnosis not present

## 2015-09-08 DIAGNOSIS — E876 Hypokalemia: Secondary | ICD-10-CM | POA: Diagnosis not present

## 2015-09-08 DIAGNOSIS — Z794 Long term (current) use of insulin: Secondary | ICD-10-CM | POA: Diagnosis not present

## 2015-09-08 DIAGNOSIS — R634 Abnormal weight loss: Secondary | ICD-10-CM | POA: Diagnosis not present

## 2015-09-08 DIAGNOSIS — E11641 Type 2 diabetes mellitus with hypoglycemia with coma: Secondary | ICD-10-CM | POA: Diagnosis not present

## 2015-09-08 DIAGNOSIS — E46 Unspecified protein-calorie malnutrition: Secondary | ICD-10-CM | POA: Diagnosis not present

## 2015-10-16 ENCOUNTER — Ambulatory Visit: Payer: Medicare Other | Admitting: Internal Medicine

## 2015-12-09 DIAGNOSIS — E1165 Type 2 diabetes mellitus with hyperglycemia: Secondary | ICD-10-CM | POA: Diagnosis not present

## 2015-12-09 DIAGNOSIS — Z794 Long term (current) use of insulin: Secondary | ICD-10-CM | POA: Diagnosis not present

## 2015-12-15 DIAGNOSIS — E876 Hypokalemia: Secondary | ICD-10-CM | POA: Diagnosis not present

## 2015-12-15 DIAGNOSIS — Z794 Long term (current) use of insulin: Secondary | ICD-10-CM | POA: Diagnosis not present

## 2015-12-15 DIAGNOSIS — R634 Abnormal weight loss: Secondary | ICD-10-CM | POA: Diagnosis not present

## 2015-12-15 DIAGNOSIS — I10 Essential (primary) hypertension: Secondary | ICD-10-CM | POA: Diagnosis not present

## 2015-12-15 DIAGNOSIS — E1165 Type 2 diabetes mellitus with hyperglycemia: Secondary | ICD-10-CM | POA: Diagnosis not present

## 2016-02-08 DIAGNOSIS — I10 Essential (primary) hypertension: Secondary | ICD-10-CM | POA: Diagnosis not present

## 2016-02-08 DIAGNOSIS — T18108A Unspecified foreign body in esophagus causing other injury, initial encounter: Secondary | ICD-10-CM | POA: Diagnosis not present

## 2016-02-08 DIAGNOSIS — R110 Nausea: Secondary | ICD-10-CM | POA: Diagnosis not present

## 2016-02-08 DIAGNOSIS — E1065 Type 1 diabetes mellitus with hyperglycemia: Secondary | ICD-10-CM | POA: Diagnosis not present

## 2016-02-08 DIAGNOSIS — T18128A Food in esophagus causing other injury, initial encounter: Secondary | ICD-10-CM | POA: Diagnosis not present

## 2016-03-07 ENCOUNTER — Ambulatory Visit (INDEPENDENT_AMBULATORY_CARE_PROVIDER_SITE_OTHER): Payer: Medicare Other | Admitting: Internal Medicine

## 2016-03-07 ENCOUNTER — Encounter: Payer: Self-pay | Admitting: Internal Medicine

## 2016-03-07 ENCOUNTER — Other Ambulatory Visit: Payer: Self-pay | Admitting: Internal Medicine

## 2016-03-07 VITALS — BP 138/88 | HR 76 | Temp 97.3°F | Ht 64.0 in | Wt 122.0 lb

## 2016-03-07 DIAGNOSIS — N63 Unspecified lump in unspecified breast: Secondary | ICD-10-CM

## 2016-03-07 DIAGNOSIS — Z0001 Encounter for general adult medical examination with abnormal findings: Secondary | ICD-10-CM | POA: Diagnosis not present

## 2016-03-07 DIAGNOSIS — I10 Essential (primary) hypertension: Secondary | ICD-10-CM

## 2016-03-07 DIAGNOSIS — N6310 Unspecified lump in the right breast, unspecified quadrant: Secondary | ICD-10-CM | POA: Diagnosis not present

## 2016-03-07 DIAGNOSIS — E119 Type 2 diabetes mellitus without complications: Secondary | ICD-10-CM

## 2016-03-07 DIAGNOSIS — D171 Benign lipomatous neoplasm of skin and subcutaneous tissue of trunk: Secondary | ICD-10-CM | POA: Diagnosis not present

## 2016-03-07 DIAGNOSIS — Z794 Long term (current) use of insulin: Secondary | ICD-10-CM

## 2016-03-07 DIAGNOSIS — Z0000 Encounter for general adult medical examination without abnormal findings: Secondary | ICD-10-CM

## 2016-03-07 LAB — POCT URINALYSIS DIPSTICK
Bilirubin, UA: NEGATIVE
Glucose, UA: 2000
Ketones, UA: 40
Leukocytes, UA: NEGATIVE
Nitrite, UA: NEGATIVE
PROTEIN UA: NEGATIVE
RBC UA: NEGATIVE
Urobilinogen, UA: 0.2
pH, UA: 5

## 2016-03-07 NOTE — Progress Notes (Signed)
Patient: Victoria Alexander, Female    DOB: 1930/10/30, 83 y.o.   MRN: [id number] Visit Date: 03/07/2016  Today's Provider: Halina Maidens, MD   Chief Complaint  Patient presents with  . Medicare Wellness   Subjective:    Annual wellness visit Victoria Alexander is a 83 y.o. female who presents today for her Subsequent Annual Wellness Visit. She feels well. She reports exercising none. She reports she is sleeping well. No breast problems.  Mammogram in 04/2014 showed a density on the right but she declined to have [place] and Bx.  ----------------------------------------------------------- Hypertension  This is a chronic problem. The current episode started more than 1 year ago. The problem is unchanged. Pertinent negatives include no chest pain, headaches, palpitations or shortness of breath.  Diabetes  She presents for her follow-up diabetic visit. She has type 2 diabetes mellitus. Her disease course has been improving. Pertinent negatives for hypoglycemia include no dizziness, headaches, nervousness/anxiousness or tremors. Pertinent negatives for diabetes include no chest pain, no fatigue, no polydipsia and no polyuria. Symptoms are stable. Current diabetic treatment includes insulin injections. She is compliant with treatment most of the time. Home blood sugar record trend: last A1C 7.8 at Endocrinology.  Lipoma/cyst of back - mass is getting larger and she is planning to try to see general surgery once her daughter moves to [facility] in February.  Breast lump on right - this was noted over 2 years ago.  Mammogram showed a density and US/bx was recommended but patient never went.  She does not think there is a problem but admits that the lump may be more prominent/larger than previously.  She wants to wait for her daughter to move here before she has it addressed.   Review of Systems  Constitutional: Negative for chills, fatigue and fever.  HENT: Negative for congestion, hearing loss, tinnitus,  trouble swallowing and voice change.   Eyes: Negative for visual disturbance.  Respiratory: Negative for cough, chest tightness, shortness of breath and wheezing.   Cardiovascular: Negative for chest pain, palpitations and leg swelling.  Gastrointestinal: Positive for constipation. Negative for abdominal pain, diarrhea and vomiting.  Endocrine: Negative for polydipsia and polyuria.  Genitourinary: Negative for dysuria, frequency, genital sores, vaginal bleeding and vaginal discharge.  Musculoskeletal: Negative for arthralgias, gait problem and joint swelling.  Skin: Negative for color change and rash.       Large cyst on upper back Hard mass in right breast  Neurological: Negative for dizziness, tremors, light-headedness and headaches.  Hematological: Negative for adenopathy. Does not bruise/bleed easily.  Psychiatric/Behavioral: Negative for dysphoric mood and sleep disturbance. The patient is not nervous/anxious.     Social History   Social History  . Marital status: Widowed    Spouse name: N/A  . Number of children: N/A  . Years of education: N/A   Occupational History  . Not on file.   Social History Main Topics  . Smoking status: Never Smoker  . Smokeless tobacco: Never Used  . Alcohol use No  . Drug use: No  . Sexual activity: Not on file   Other Topics Concern  . Not on file   Social History Narrative  . No narrative on file    Patient Active Problem List   Diagnosis Date Noted  . Addison anemia 12/22/2014  . Breast lump 12/22/2014  . Type 2 diabetes mellitus with hyperglycemia ([place]) 12/22/2014  . Essential (primary) hypertension 12/22/2014  . Lipoma of back 12/22/2014  . Type 2 diabetes mellitus (  [place]) 01/03/2014    Past Surgical History:  Procedure Laterality Date  . ABDOMINAL HYSTERECTOMY    . CHOLECYSTECTOMY      Her family history includes Diabetes in her mother and sister.     Previous Medications   AMLODIPINE (NORVASC) 10 MG TABLET    Take 1  tablet by mouth daily.   ASPIRIN 81 MG CHEWABLE TABLET    Chew 1 tablet by mouth daily.   B-D ULTRAFINE III SHORT PEN 31G X 8 MM MISC    USE AS DIRECTED. USE 4 NEEDLES A DAY WITH DIABETIC MEDICATION PENS AS DIRECTED   GLUCOSE BLOOD VI       LOSARTAN (COZAAR) 100 MG TABLET    Take 1 tablet by mouth daily.   NOVOLOG MIX 70/30 FLEXPEN (70-30) 100 UNIT/ML FLEXPEN    TAKE 10 UNITS 30 MINUTES BEFORE BREAKFAST AND 8 UNITS 30 MINUTES BEFORE SUPPER.    Patient Care Team: Glean Hess, MD as PCP - General (Family Medicine)      Objective:   Vitals: BP 138/88   Pulse 76   Temp 97.3 F (36.3 C)   Ht '5\' 4"'$  (1.626 m)   Wt 122 lb (55.3 kg)   SpO2 99%   BMI 20.94 kg/m   Physical Exam  Constitutional: She is oriented to person, place, and time. She appears well-developed and well-nourished. No distress.  HENT:  Head: Normocephalic and atraumatic.  Right Ear: Tympanic membrane and ear canal normal.  Left Ear: Tympanic membrane and ear canal normal.  Nose: Right sinus exhibits no maxillary sinus tenderness. Left sinus exhibits no maxillary sinus tenderness.  Mouth/Throat: Uvula is midline and oropharynx is clear and moist.  Eyes: Conjunctivae and EOM are normal. Right eye exhibits no discharge. Left eye exhibits no discharge. No scleral icterus.  Neck: Normal range of motion. Carotid bruit is not present. No erythema present. No thyromegaly present.  Cardiovascular: Normal rate, regular rhythm, normal heart sounds and normal pulses.   Pulmonary/Chest: Effort normal. No respiratory distress. She has no wheezes. Right breast exhibits no mass, no nipple discharge, no skin change and no tenderness. Left breast exhibits no mass, no nipple discharge, no skin change and no tenderness.  Abdominal: Soft. Bowel sounds are normal. There is no hepatosplenomegaly. There is no tenderness. There is no CVA tenderness.  Musculoskeletal: Normal range of motion.  Lymphadenopathy:    She has no cervical  adenopathy.    She has no axillary adenopathy.  Neurological: She is alert and oriented to person, place, and time. She has normal reflexes. No cranial nerve deficit or sensory deficit.  Skin: Skin is warm, dry and intact. No rash noted.     Psychiatric: She has a normal mood and affect. Her speech is normal and behavior is normal. Thought content normal. Cognition and memory are normal.  Nursing note and vitals reviewed.   Activities of Daily Living In your present state of health, do you have any difficulty performing the following activities: 03/07/2016  Hearing? N  Vision? N  Difficulty concentrating or making decisions? N  Walking or climbing stairs? N  Dressing or bathing? N  Doing errands, shopping? N  Preparing Food and eating ? Y  Using the Toilet? Y  In the past six months, have you accidently leaked urine? N  Do you have problems with loss of bowel control? N  Managing your Medications? Y  Managing your Finances? Y  Housekeeping or managing your Housekeeping? Y  Some recent data might  be hidden    Fall Risk Assessment Fall Risk  03/07/2016 12/31/2014  Falls in the past year? No No      Depression Screen PHQ 2/9 Scores 03/07/2016 12/31/2014  PHQ - 2 Score 0 0   6CIT Screen 03/07/2016  What Year? 0 points  What month? 0 points  What time? 0 points  Count back from 20 0 points  Months in reverse 0 points  Repeat phrase 0 points  Total Score 0     Medicare Annual Wellness Visit Summary:  Reviewed patient's Family Medical History Reviewed and updated list of patient's medical providers Assessment of cognitive impairment was done Assessed patient's functional ability Established a written schedule for health screening [place] Completed and Reviewed  Exercise Activities and Dietary recommendations Goals    None      Immunization History  Administered Date(s) Administered  . Pneumococcal Polysaccharide-23 12/29/2010    Health  Maintenance  Topic Date Due  . FOOT EXAM  01/12/1943  . OPHTHALMOLOGY EXAM  01/12/1943  . TETANUS/TDAP  01/12/1952  . ZOSTAVAX  01/11/1993  . DEXA SCAN  01/11/1998  . PNA vac Low Risk Adult (2 of 2 - PCV13) 12/29/2011  . INFLUENZA VACCINE  09/29/2015  . HEMOGLOBIN A1C  01/14/2016    Discussed health benefits of physical activity, and encouraged her to engage in regular exercise appropriate for her age and condition.    ------------------------------------------------------------------------------------------------------------  Assessment & Plan:  1. Medicare annual wellness visit, subsequent Measures satisfied Patient declines all vaccinations but might consider Tdap  2. Type 2 diabetes mellitus without complication, with long-term current use of insulin (H[facility]) Continue insulin bid and Endocrinology follow up - Comprehensive metabolic panel - Lipid panel - POCT urinalysis dipstick - Microalbumin / creatinine urine ratio  3. Breast lump Encourage follow up in the next few months  4. Lipoma of back Benign but large - consult General surgery if desired  5. Essential (primary) hypertension controlled - CBC with Differential/Platelet - TSH   Halina Maidens, MD [facility]  03/07/2016

## 2016-03-07 NOTE — Patient Instructions (Addendum)
Health Maintenance  Topic Date Due  . OPHTHALMOLOGY EXAM  01/12/1943  . TETANUS/TDAP  01/12/1952  . DEXA SCAN  01/11/1998  . HEMOGLOBIN A1C  01/14/2016  . INFLUENZA VACCINE  03/07/2017 (Originally 09/29/2015)  . PNA vac Low Risk Adult (2 of 2 - PCV13) 03/07/2017 (Originally 12/29/2011)  . ZOSTAVAX  03/07/2021 (Originally 01/11/1993)  . FOOT EXAM  03/07/2017   I will call you in February to discuss evaluation of right breast lump.  Get eye exam every year due to diabetes.  I recommend tetanus booster this spring.

## 2016-03-08 LAB — MICROALBUMIN / CREATININE URINE RATIO
Creatinine, Urine: 42.3 mg/dL
MICROALB/CREAT RATIO: 49.4 mg/g{creat} — AB (ref 0.0–30.0)
MICROALBUM., U, RANDOM: 20.9 ug/mL

## 2016-03-21 DIAGNOSIS — Z794 Long term (current) use of insulin: Secondary | ICD-10-CM | POA: Diagnosis not present

## 2016-03-21 DIAGNOSIS — E1165 Type 2 diabetes mellitus with hyperglycemia: Secondary | ICD-10-CM | POA: Diagnosis not present

## 2016-03-21 DIAGNOSIS — E876 Hypokalemia: Secondary | ICD-10-CM | POA: Diagnosis not present

## 2016-03-21 DIAGNOSIS — E11641 Type 2 diabetes mellitus with hypoglycemia with coma: Secondary | ICD-10-CM | POA: Diagnosis not present

## 2016-03-24 DIAGNOSIS — I10 Essential (primary) hypertension: Secondary | ICD-10-CM | POA: Diagnosis not present

## 2016-03-24 DIAGNOSIS — Z794 Long term (current) use of insulin: Secondary | ICD-10-CM | POA: Diagnosis not present

## 2016-03-24 DIAGNOSIS — E1165 Type 2 diabetes mellitus with hyperglycemia: Secondary | ICD-10-CM | POA: Diagnosis not present

## 2016-03-24 DIAGNOSIS — E46 Unspecified protein-calorie malnutrition: Secondary | ICD-10-CM | POA: Diagnosis not present

## 2016-03-24 DIAGNOSIS — R634 Abnormal weight loss: Secondary | ICD-10-CM | POA: Diagnosis not present

## 2016-04-14 DIAGNOSIS — Z79899 Other long term (current) drug therapy: Secondary | ICD-10-CM | POA: Diagnosis not present

## 2016-04-14 DIAGNOSIS — E11641 Type 2 diabetes mellitus with hypoglycemia with coma: Secondary | ICD-10-CM | POA: Diagnosis not present

## 2016-04-14 DIAGNOSIS — R634 Abnormal weight loss: Secondary | ICD-10-CM | POA: Diagnosis not present

## 2016-04-14 DIAGNOSIS — Z794 Long term (current) use of insulin: Secondary | ICD-10-CM | POA: Diagnosis not present

## 2016-04-14 DIAGNOSIS — E46 Unspecified protein-calorie malnutrition: Secondary | ICD-10-CM | POA: Diagnosis not present

## 2016-04-14 DIAGNOSIS — I10 Essential (primary) hypertension: Secondary | ICD-10-CM | POA: Diagnosis not present

## 2016-04-14 DIAGNOSIS — E1165 Type 2 diabetes mellitus with hyperglycemia: Secondary | ICD-10-CM | POA: Diagnosis not present

## 2016-04-25 ENCOUNTER — Telehealth: Payer: Self-pay | Admitting: Internal Medicine

## 2016-04-25 NOTE — Telephone Encounter (Signed)
-----   Message from Glean Hess, MD sent at 03/07/2016 11:56 AM EST ----- Regarding: breast mass Call patient and daughter to arrange follow up of right breast mass.  Patient requested delay until daughter moves to [place] around the end of February.

## 2016-04-25 NOTE — Telephone Encounter (Signed)
Called patient to see if her daughter has arrived in [place].  Her daughter, Tarri Glenn, has not yet moved here.  Victoria Alexander says that she has no one else she can ask to take her for mammogram or biopsy. She reports feeling fine and believes that her daughter will be here at the end of next month.  She requests that I call back at that time.

## 2016-06-02 ENCOUNTER — Other Ambulatory Visit: Payer: Self-pay | Admitting: Internal Medicine

## 2016-06-02 DIAGNOSIS — D493 Neoplasm of unspecified behavior of breast: Secondary | ICD-10-CM

## 2016-06-13 ENCOUNTER — Ambulatory Visit: Payer: Self-pay | Attending: Internal Medicine

## 2016-06-13 ENCOUNTER — Other Ambulatory Visit: Payer: Self-pay

## 2016-08-08 DIAGNOSIS — E1165 Type 2 diabetes mellitus with hyperglycemia: Secondary | ICD-10-CM | POA: Diagnosis not present

## 2016-08-08 DIAGNOSIS — Z794 Long term (current) use of insulin: Secondary | ICD-10-CM | POA: Diagnosis not present

## 2016-09-13 DIAGNOSIS — E1165 Type 2 diabetes mellitus with hyperglycemia: Secondary | ICD-10-CM | POA: Diagnosis not present

## 2016-09-13 DIAGNOSIS — I10 Essential (primary) hypertension: Secondary | ICD-10-CM | POA: Diagnosis not present

## 2016-09-13 DIAGNOSIS — R634 Abnormal weight loss: Secondary | ICD-10-CM | POA: Diagnosis not present

## 2016-09-13 DIAGNOSIS — Z794 Long term (current) use of insulin: Secondary | ICD-10-CM | POA: Diagnosis not present

## 2016-09-13 DIAGNOSIS — E46 Unspecified protein-calorie malnutrition: Secondary | ICD-10-CM | POA: Diagnosis not present

## 2016-10-05 ENCOUNTER — Ambulatory Visit: Payer: Medicare Other | Admitting: Internal Medicine

## 2016-11-01 ENCOUNTER — Ambulatory Visit
Admission: RE | Admit: 2016-11-01 | Discharge: 2016-11-01 | Payer: MEDICARE | Attending: Dermatology | Admitting: Dermatology

## 2016-11-01 DIAGNOSIS — D179 Benign lipomatous neoplasm, unspecified: Principal | ICD-10-CM

## 2016-11-15 DIAGNOSIS — I10 Essential (primary) hypertension: Secondary | ICD-10-CM | POA: Diagnosis not present

## 2016-11-15 DIAGNOSIS — H52223 Regular astigmatism, bilateral: Secondary | ICD-10-CM | POA: Diagnosis not present

## 2016-11-15 DIAGNOSIS — H5203 Hypermetropia, bilateral: Secondary | ICD-10-CM | POA: Diagnosis not present

## 2016-11-15 DIAGNOSIS — H25813 Combined forms of age-related cataract, bilateral: Secondary | ICD-10-CM | POA: Diagnosis not present

## 2016-11-15 DIAGNOSIS — E119 Type 2 diabetes mellitus without complications: Secondary | ICD-10-CM | POA: Diagnosis not present

## 2016-11-15 DIAGNOSIS — H524 Presbyopia: Secondary | ICD-10-CM | POA: Diagnosis not present

## 2016-11-22 ENCOUNTER — Ambulatory Visit
Admission: RE | Admit: 2016-11-22 | Discharge: 2016-11-22 | Payer: MEDICARE | Attending: Dermatology | Admitting: Dermatology

## 2016-11-22 DIAGNOSIS — D485 Neoplasm of uncertain behavior of skin: Principal | ICD-10-CM

## 2016-11-22 DIAGNOSIS — D2112 Benign neoplasm of connective and other soft tissue of left upper limb, including shoulder: Secondary | ICD-10-CM | POA: Diagnosis not present

## 2016-11-22 DIAGNOSIS — D1722 Benign lipomatous neoplasm of skin and subcutaneous tissue of left arm: Secondary | ICD-10-CM | POA: Diagnosis not present

## 2016-11-29 ENCOUNTER — Ambulatory Visit
Admission: RE | Admit: 2016-11-29 | Discharge: 2016-11-29 | Payer: MEDICARE | Attending: Dermatology | Admitting: Dermatology

## 2016-11-29 DIAGNOSIS — B999 Unspecified infectious disease: Principal | ICD-10-CM

## 2016-11-29 MED ORDER — CEPHALEXIN 500 MG CAPSULE
ORAL_CAPSULE | Freq: Three times a day (TID) | ORAL | 0 refills | 0.00000 days | Status: CP
Start: 2016-11-29 — End: 2017-05-01

## 2016-11-30 ENCOUNTER — Ambulatory Visit
Admission: RE | Admit: 2016-11-30 | Discharge: 2016-11-30 | Payer: MEDICARE | Attending: Dermatology | Admitting: Dermatology

## 2016-11-30 DIAGNOSIS — T148XXA Other injury of unspecified body region, initial encounter: Principal | ICD-10-CM

## 2016-12-07 ENCOUNTER — Ambulatory Visit
Admission: RE | Admit: 2016-12-07 | Discharge: 2016-12-07 | Payer: MEDICARE | Attending: Dermatology | Admitting: Dermatology

## 2016-12-07 DIAGNOSIS — T148XXA Other injury of unspecified body region, initial encounter: Principal | ICD-10-CM

## 2016-12-16 ENCOUNTER — Ambulatory Visit: Admission: RE | Admit: 2016-12-16 | Discharge: 2016-12-16 | Payer: MEDICARE | Attending: Dermatology

## 2016-12-16 DIAGNOSIS — Z4802 Encounter for removal of sutures: Principal | ICD-10-CM

## 2016-12-22 DIAGNOSIS — E1165 Type 2 diabetes mellitus with hyperglycemia: Secondary | ICD-10-CM | POA: Diagnosis not present

## 2016-12-22 DIAGNOSIS — Z794 Long term (current) use of insulin: Secondary | ICD-10-CM | POA: Diagnosis not present

## 2016-12-22 DIAGNOSIS — E46 Unspecified protein-calorie malnutrition: Secondary | ICD-10-CM | POA: Diagnosis not present

## 2016-12-22 DIAGNOSIS — R634 Abnormal weight loss: Secondary | ICD-10-CM | POA: Diagnosis not present

## 2016-12-22 DIAGNOSIS — E11641 Type 2 diabetes mellitus with hypoglycemia with coma: Secondary | ICD-10-CM | POA: Diagnosis not present

## 2016-12-27 ENCOUNTER — Ambulatory Visit (INDEPENDENT_AMBULATORY_CARE_PROVIDER_SITE_OTHER): Payer: Medicare Other

## 2016-12-27 DIAGNOSIS — Z23 Encounter for immunization: Secondary | ICD-10-CM

## 2017-03-09 ENCOUNTER — Ambulatory Visit: Payer: Medicare Other

## 2017-03-13 ENCOUNTER — Ambulatory Visit: Payer: Medicare Other

## 2017-03-15 ENCOUNTER — Ambulatory Visit (INDEPENDENT_AMBULATORY_CARE_PROVIDER_SITE_OTHER): Payer: Medicare Other

## 2017-03-15 VITALS — BP 128/60 | HR 94 | Temp 97.7°F | Resp 16 | Ht 64.0 in | Wt 119.8 lb

## 2017-03-15 DIAGNOSIS — Z0000 Encounter for general adult medical examination without abnormal findings: Secondary | ICD-10-CM | POA: Diagnosis not present

## 2017-03-15 DIAGNOSIS — Z23 Encounter for immunization: Secondary | ICD-10-CM | POA: Diagnosis not present

## 2017-03-15 DIAGNOSIS — S61214A Laceration without foreign body of right ring finger without damage to nail, initial encounter: Secondary | ICD-10-CM | POA: Diagnosis not present

## 2017-03-15 NOTE — Progress Notes (Signed)
Subjective:   Victoria Alexander is a 84 y.o. female who presents for Medicare Annual (Subsequent) preventive examination. Upon calling pt back to begin visit, pt son approached and reported pt to have a lac to R ring finger and asked for a band aid to cover the wound. Further states that his mother went to the bathroom outside the office and cut her finger on the door handle of the bathroom door. Pt was taken to exam room for further evaluation. Pt confirmed she was exiting the bathroom and when opening the exterior door, cut her finger on the door handle.  Noted 0.5cm lac to R ring finger near first joint. Minimal bleeding. Called Dr. Army Melia to exam room for further evaluation. Wound cleansed and a band aid was applied by Dr. Army Melia. Per Dr. Army Melia, no further treatment to R ring finger required at this time. Verbal order received to administer Td vaccine today. Pt advised to cleanse wound daily and cover with band aid until healed. Further advised, if wound becomes red, swollen or warm to touch, to call office for further instructions or proceed to ED for further treatment. Verbalized acceptance and understanding.  Review of Systems:  N/A Cardiac Risk Factors include: sedentary lifestyle;advanced age (>23men, >66 women);diabetes mellitus;hypertension     Objective:     Vitals: BP 128/60 (BP Location: Right Arm, Patient Position: Sitting, Cuff Size: Normal)   Pulse 94   Temp 97.7 F (36.5 C) (Oral)   Resp 16   Ht 5\' 4"  (1.626 m)   Wt 119 lb 12.8 oz (54.3 kg)   BMI 20.56 kg/m   Body mass index is 20.56 kg/m.  Advanced Directives 03/15/2017 03/07/2016  Does Patient Have a Medical Advance Directive? No No  Would patient like information on creating a medical advance directive? Yes (MAU/Ambulatory/Procedural Areas - Information given) No - Patient declined    Tobacco Social History   Tobacco Use  Smoking Status Never Smoker  Smokeless Tobacco Never Used  Tobacco Comment   smoking cessation materials not required     Counseling given: No Comment: smoking cessation materials not required   Clinical Intake:  Pre-visit preparation completed: Yes  Pain : No/denies pain     BMI - recorded: 20.56 Nutritional Status: BMI of 19-24  Normal Nutritional Risks: None Diabetes: Yes CBG done?: No Did pt. bring in CBG monitor from home?: No  How often do you need to have someone help you when you read instructions, pamphlets, or other written materials from your doctor or pharmacy?: 1 - Never  Interpreter Needed?: No  Information entered by :: AEversole, LPN  Past Medical History:  Diagnosis Date  . B12 deficiency   . Diabetes mellitus without complication ([place])   . Hypertension   . Lump, breast    Right   Past Surgical History:  Procedure Laterality Date  . ABDOMINAL HYSTERECTOMY    . CHOLECYSTECTOMY    . CYST REMOVAL NECK     Family History  Problem Relation Age of Onset  . Diabetes Mother   . Diabetes Sister   . Cancer Brother   . Cancer Sister    Social History   Socioeconomic History  . Marital status: Widowed    Spouse name: None  . Number of children: 6  . Years of education: None  . Highest education level: 9th grade  Social Needs  . Financial resource strain: Not hard at all  . Food insecurity - worry: Never true  . Food insecurity -  inability: Never true  . Transportation needs - medical: No  . Transportation needs - non-medical: No  Occupational History  . Occupation: Retired  Tobacco Use  . Smoking status: Never Smoker  . Smokeless tobacco: Never Used  . Tobacco comment: smoking cessation materials not required  Substance and Sexual Activity  . Alcohol use: No    Alcohol/week: 0.0 oz  . Drug use: No  . Sexual activity: No  Other Topics Concern  . None  Social History Narrative  . None    Outpatient Encounter Medications as of 03/15/2017  Medication Sig  . amLODipine (NORVASC) 10 MG tablet Take 1 tablet by  mouth daily.  Marland Kitchen aspirin 81 MG chewable tablet Chew 1 tablet by mouth daily.  . B-D ULTRAFINE III SHORT PEN 31G X 8 MM MISC USE AS DIRECTED. USE 4 NEEDLES A DAY WITH DIABETIC MEDICATION PENS AS DIRECTED  . GLUCOSE BLOOD VI   . NOVOLOG MIX 70/30 FLEXPEN (70-30) 100 UNIT/ML FlexPen TAKE 10 UNITS 30 MINUTES BEFORE BREAKFAST AND 8 UNITS 30 MINUTES BEFORE SUPPER.  Marland Kitchen losartan (COZAAR) 100 MG tablet Take 1 tablet by mouth daily.   No facility-administered encounter medications on file as of 03/15/2017.     Activities of Daily Living In your present state of health, do you have any difficulty performing the following activities: 03/15/2017  Hearing? N  Comment denies use of hearing aids  Vision? N  Comment wears eyeglasses  Difficulty concentrating or making decisions? N  Walking or climbing stairs? N  Dressing or bathing? N  Doing errands, shopping? Y  Comment son or daughter transports to [employer] and eating ? N  Comment full set of upper and lower dentures  Using the Toilet? N  In the past six months, have you accidently leaked urine? Y  Comment urgency, wears pads  Do you have problems with loss of bowel control? N  Managing your Medications? N  Managing your Finances? Y  Comment son Holiday representative or managing your Housekeeping? N  Some recent data might be hidden    Patient Care Team: Glean Hess, MD as PCP - General (Family Medicine)    Assessment:   This is a routine wellness examination for Victoria Alexander.  Exercise Activities and Dietary recommendations Current Exercise Habits: The patient does not participate in regular exercise at present, Exercise limited by: None identified  Goals    . DIET - INCREASE WATER INTAKE     Recommend to drink at least 6-8 8oz glasses of water per day.        Fall Risk Fall Risk  03/15/2017 03/07/2016 12/31/2014  Falls in the past year? Yes No No  Comment tripped over a scooter - -  Number falls in past yr: 1 -  -  Injury with Fall? No - -  Risk for fall due to : Medication side effect;Impaired vision - -  Risk for fall due to: Comment wears eyeglasses - -  Follow up Education provided;Falls evaluation completed;Falls prevention discussed - -   Is the patient's home free of loose throw rugs in walkways, pet beds, electrical cords, etc?   Yes Does the patient have any grab bars in the bathroom? Yes  Does the patient use a shower chair when bathing? No Does the patient have any stairs in or around the home? No If so, are there any handrails?  N/A Does the patient have adequate lighting?  Yes Does the patient use a cane, walker  or w/c? No Does the patient use of an elevated toilet seat? No  Timed Get Up and Go Performed: Yes/No. Pt ambulated 10 feet within 20 sec. Gait slow, steady and without the use of an assistive device. No intervention required at this time. Fall risk prevention has been discussed.  Depression Screen PHQ 2/9 Scores 03/15/2017 03/07/2016 12/31/2014  PHQ - 2 Score 0 0 0     Cognitive Function     6CIT Screen 03/15/2017 03/07/2016  What Year? 4 points 0 points  What month? 0 points 0 points  What time? 0 points 0 points  Count back from 20 0 points 0 points  Months in reverse 4 points 0 points  Repeat phrase 8 points 0 points  Total Score 16 0    Immunization History  Administered Date(s) Administered  . Influenza, High Dose Seasonal PF 12/27/2016  . Pneumococcal Conjugate-13 03/15/2017  . Pneumococcal Polysaccharide-23 12/29/2010  . Td 03/15/2017    Qualifies for Shingles Vaccine? Yes. Due for Zostavax or Shingrix vaccine. Education has been provided regarding the importance of this vaccine. Pt has been advised to call her insurance company to determine her out of pocket expense. Advised she may also receive this vaccine at her local pharmacy or Health Dept. Verbalized acceptance and understanding.  Due for Tdap vaccine. Declined my offer to administer today. Education  has been provided regarding the importance of this vaccine but still declined. Pt has been advised to call her insurance company to determine her out of pocket expense. Advised she may also receive this vaccine at her local pharmacy or Health Dept. Verbalized acceptance and understanding.  Screening Tests Health Maintenance  Topic Date Due  . OPHTHALMOLOGY EXAM  01/12/1941  . HEMOGLOBIN A1C  01/14/2016  . FOOT EXAM  03/07/2017  . URINE MICROALBUMIN  03/07/2017  . TETANUS/TDAP  03/16/2027  . INFLUENZA VACCINE  Completed  . PNA vac Low Risk Adult  Completed  . DEXA SCAN  Discontinued    Cancer Screenings:  Lung: Low Dose CT Chest recommended if Age 66-80 years, 30 pack-year currently smoking OR have quit w/in 15years. Patient does not qualify. NON-SMOKER Breast:  Up to date on Mammogram? No  Ordered 06/02/16. Pt states she no longer wants to have this screening done. States, even if the results of mammogram were to show breast cancer, she would not undergo treatment. Up to date of Bone Density/Dexa? No. No longer required Colorectal: no longer required  Additional Screenings: Hepatitis B/HIV/Syphillis: does not qualify Hepatitis C Screening: does not qualify     Plan:  I have personally reviewed and addressed the Medicare Annual Wellness questionnaire and have noted the following in the patient's chart:  A. Medical and social history B. Use of alcohol, tobacco or illicit drugs  C. Current medications and supplements D. Functional ability and status E.  Nutritional status F.  Physical activity G. Advance directives H. List of other physicians I.  Hospitalizations, surgeries, and ER visits in previous 12 months J.  [place] such as hearing and vision if needed, cognitive and depression L. Referrals and appointments - none  In addition, I have reviewed and discussed with patient certain preventive protocols, quality metrics, and best practice recommendations. A written  personalized care plan for preventive services as well as general preventive health recommendations were provided to patient.  Signed,  Aleatha Borer, LPN Nurse Health Advisor  MD Recommendations: Due for Zostavax or Shingrix vaccine. Education has been provided regarding the importance of  this vaccine. Pt has been advised to call her insurance company to determine her out of pocket expense. Advised she may also receive this vaccine at her local pharmacy or Health Dept. Verbalized acceptance and understanding.  Overdue for diabetic eye exam. Pt has been advised about the importance in completing this exam. Advised to schedule an appt with ophthalmologist to complete this exam. Once completed, reminded pt to provide the office with a copy of his/her diabetic eye exam. Verbalized acceptance and understanding.  Overdue for diabetic foot exam. Last performed on 03/07/16. Pt has been advised about the importance in completing this exam. Advised to schedule an appt with Dr. Army Melia or podiatrist to complete this exam. Verbalized acceptance and understanding.  Due for mammogram Ordered 06/02/16 but not completed. Declined my offer to reorder this exam. Pt states she no longer wants to have this screening done. States, even if the results of mammogram were to show breast cancer, she would not undergo treatment.

## 2017-03-15 NOTE — Patient Instructions (Signed)
Ms. Victoria Alexander , Thank you for taking time to come for your Medicare Wellness Visit. I appreciate your ongoing commitment to your health goals. Please review the following plan we discussed and let me know if I can assist you in the future.   Screening recommendations/referrals: Colonoscopy: No longer required Mammogram: No longer required Bone Density: No longer required Recommended yearly ophthalmology/optometry visit for glaucoma screening and checkup Recommended yearly dental visit for hygiene and checkup  Vaccinations: Influenza vaccine: Up to date Pneumococcal vaccine: Completed series Tdap vaccine: Up to date Shingles vaccine: Declined. Please call your insurance company to determine your out of pocket expense. You may also receive this vaccine at your local pharmacy or Health Dept.  Advanced directives: Advance directive discussed with you today. I have provided a copy for you to complete at home and have notarized. Once this is complete please bring a copy in to our office so we can scan it into your chart.  Conditions/risks identified: Recommend to drink at least 6-8 8oz glasses of water per day.  Next appointment: Please schedule an appointment with Dr. Army Melia within the next 30 days for follow up after today's Annual Wellness Visit.  Please schedule your Annual Wellness Visit with your Nurse Health Advisor in one year.  Preventive Care 82 Years and Older, Female Preventive care refers to lifestyle choices and visits with your health care provider that can promote health and wellness. What does preventive care include?  A yearly physical exam. This is also called an annual well check.  Dental exams once or twice a year.  Routine eye exams. Ask your health care provider how often you should have your eyes checked.  Personal lifestyle choices, including:  Daily care of your teeth and gums.  Regular physical activity.  Eating a healthy diet.  Avoiding tobacco and  drug use.  Limiting alcohol use.  Practicing safe sex.  Taking low-dose aspirin every day.  Taking vitamin and mineral supplements as recommended by your health care provider. What happens during an annual well check? The services and screenings done by your health care provider during your annual well check will depend on your age, overall health, lifestyle risk factors, and family history of disease. Counseling  Your health care provider may ask you questions about your:  Alcohol use.  Tobacco use.  Drug use.  Emotional well-being.  Home and relationship well-being.  Sexual activity.  Eating habits.  History of falls.  Memory and ability to understand (cognition).  Work and work Statistician.  Reproductive health. Screening  You may have the following tests or measurements:  Height, weight, and BMI.  Blood pressure.  Lipid and cholesterol levels. These may be checked every 5 years, or more frequently if you are over 52 years old.  Skin check.  Lung cancer screening. You may have this screening every year starting at age 91 if you have a 30-pack-year history of smoking and currently smoke or have quit within the past 15 years.  Fecal occult blood test (FOBT) of the stool. You may have this test every year starting at age 56.  Flexible sigmoidoscopy or colonoscopy. You may have a sigmoidoscopy every 5 years or a colonoscopy every 10 years starting at age 20.  Hepatitis C blood test.  Hepatitis B blood test.  Sexually transmitted disease (STD) testing.  Diabetes screening. This is done by checking your blood sugar (glucose) after you have not eaten for a while (fasting). You may have this done every 1-3 years.  Bone density scan. This is done to screen for osteoporosis. You may have this done starting at age 32.  Mammogram. This may be done every 1-2 years. Talk to your health care provider about how often you should have regular mammograms. Talk with your  health care provider about your test results, treatment options, and if necessary, the need for more tests. Vaccines  Your health care provider may recommend certain vaccines, such as:  Influenza vaccine. This is recommended every year.  Tetanus, diphtheria, and acellular pertussis (Tdap, Td) vaccine. You may need a Td booster every 10 years.  Zoster vaccine. You may need this after age 101.  Pneumococcal 13-valent conjugate (PCV13) vaccine. One dose is recommended after age 31.  Pneumococcal polysaccharide (PPSV23) vaccine. One dose is recommended after age 73. Talk to your health care provider about which screenings and vaccines you need and how often you need them. This information is not intended to replace advice given to you by your health care provider. Make sure you discuss any questions you have with your health care provider. Document Released: 03/13/2015 Document Revised: 11/04/2015 Document Reviewed: 12/16/2014 Elsevier Interactive Patient Education  2017 [place] Prevention in the Home Falls can cause injuries. They can happen to people of all ages. There are many things you can do to make your home safe and to help prevent falls. What can I do on the outside of my home?  Regularly fix the edges of walkways and driveways and fix any cracks.  Remove anything that might make you trip as you walk through a door, such as a raised step or threshold.  Trim any bushes or trees on the path to your home.  Use bright outdoor lighting.  Clear any walking paths of anything that might make someone trip, such as rocks or tools.  Regularly check to see if handrails are loose or broken. Make sure that both sides of any steps have handrails.  Any raised decks and porches should have guardrails on the edges.  Have any leaves, snow, or ice cleared regularly.  Use sand or salt on walking paths during winter.  Clean up any spills in your garage right away. This includes oil  or grease spills. What can I do in the bathroom?  Use night lights.  Install grab bars by the toilet and in the tub and shower. Do not use towel bars as grab bars.  Use non-skid mats or decals in the tub or shower.  If you need to sit down in the shower, use a plastic, non-slip stool.  Keep the floor dry. Clean up any water that spills on the floor as soon as it happens.  Remove soap buildup in the tub or shower regularly.  Attach bath mats securely with double-sided non-slip rug tape.  Do not have throw rugs and other things on the floor that can make you trip. What can I do in the bedroom?  Use night lights.  Make sure that you have a light by your bed that is easy to reach.  Do not use any sheets or blankets that are too big for your bed. They should not hang down onto the floor.  Have a firm chair that has side arms. You can use this for support while you get dressed.  Do not have throw rugs and other things on the floor that can make you trip. What can I do in the kitchen?  Clean up any spills right away.  Avoid walking  on wet floors.  Keep items that you use a lot in easy-to-reach places.  If you need to reach something above you, use a strong step stool that has a grab bar.  Keep electrical cords out of the way.  Do not use floor polish or wax that makes floors slippery. If you must use wax, use non-skid floor wax.  Do not have throw rugs and other things on the floor that can make you trip. What can I do with my stairs?  Do not leave any items on the stairs.  Make sure that there are handrails on both sides of the stairs and use them. Fix handrails that are broken or loose. Make sure that handrails are as long as the stairways.  Check any carpeting to make sure that it is firmly attached to the stairs. Fix any carpet that is loose or worn.  Avoid having throw rugs at the top or bottom of the stairs. If you do have throw rugs, attach them to the floor with  carpet tape.  Make sure that you have a light switch at the top of the stairs and the bottom of the stairs. If you do not have them, ask someone to add them for you. What else can I do to help prevent falls?  Wear shoes that:  Do not have high heels.  Have rubber bottoms.  Are comfortable and fit you well.  Are closed at the toe. Do not wear sandals.  If you use a stepladder:  Make sure that it is fully opened. Do not climb a closed stepladder.  Make sure that both sides of the stepladder are locked into place.  Ask someone to hold it for you, if possible.  Clearly mark and make sure that you can see:  Any grab bars or handrails.  First and last steps.  Where the edge of each step is.  Use tools that help you move around (mobility aids) if they are needed. These include:  Canes.  Walkers.  Scooters.  Crutches.  Turn on the lights when you go into a dark area. Replace any light bulbs as soon as they burn out.  Set up your furniture so you have a clear path. Avoid moving your furniture around.  If any of your floors are uneven, fix them.  If there are any pets around you, be aware of where they are.  Review your medicines with your doctor. Some medicines can make you feel dizzy. This can increase your chance of falling. Ask your doctor what other things that you can do to help prevent falls. This information is not intended to replace advice given to you by your health care provider. Make sure you discuss any questions you have with your health care provider. Document Released: 12/11/2008 Document Revised: 07/23/2015 Document Reviewed: 03/21/2014 Elsevier Interactive Patient Education  2017 [employer].

## 2017-03-28 DIAGNOSIS — E11641 Type 2 diabetes mellitus with hypoglycemia with coma: Secondary | ICD-10-CM | POA: Diagnosis not present

## 2017-03-28 DIAGNOSIS — R634 Abnormal weight loss: Secondary | ICD-10-CM | POA: Diagnosis not present

## 2017-03-28 DIAGNOSIS — E1165 Type 2 diabetes mellitus with hyperglycemia: Secondary | ICD-10-CM | POA: Diagnosis not present

## 2017-03-28 DIAGNOSIS — E46 Unspecified protein-calorie malnutrition: Secondary | ICD-10-CM | POA: Diagnosis not present

## 2017-03-28 DIAGNOSIS — Z794 Long term (current) use of insulin: Secondary | ICD-10-CM | POA: Diagnosis not present

## 2017-03-28 LAB — HEMOGLOBIN A1C: Hemoglobin A1C: 10.9

## 2017-04-26 ENCOUNTER — Ambulatory Visit (INDEPENDENT_AMBULATORY_CARE_PROVIDER_SITE_OTHER): Payer: Medicare Other | Admitting: Internal Medicine

## 2017-04-26 ENCOUNTER — Encounter: Payer: Self-pay | Admitting: Internal Medicine

## 2017-04-26 ENCOUNTER — Ambulatory Visit: Payer: Medicare Other | Admitting: Internal Medicine

## 2017-04-26 VITALS — BP 102/62 | HR 102 | Temp 98.2°F | Ht 64.0 in | Wt 119.0 lb

## 2017-04-26 DIAGNOSIS — I10 Essential (primary) hypertension: Secondary | ICD-10-CM | POA: Diagnosis not present

## 2017-04-26 DIAGNOSIS — E1165 Type 2 diabetes mellitus with hyperglycemia: Secondary | ICD-10-CM | POA: Diagnosis not present

## 2017-04-26 DIAGNOSIS — J069 Acute upper respiratory infection, unspecified: Secondary | ICD-10-CM | POA: Diagnosis not present

## 2017-04-26 DIAGNOSIS — R0789 Other chest pain: Secondary | ICD-10-CM

## 2017-04-26 DIAGNOSIS — Z794 Long term (current) use of insulin: Secondary | ICD-10-CM

## 2017-04-26 NOTE — Patient Instructions (Signed)
Take Xyzal 5 mg once a day until gone for cold symptoms.

## 2017-04-26 NOTE — Progress Notes (Signed)
Date:  04/26/2017   Name:  Victoria Alexander   DOB:  Sep 05, 1930   MRN:  [id number]   Chief Complaint: Cough (X 2 weeks. Coughing with clear production. Chest congestion. Tickle in throat causes cough to be worse. No body aches or fever. )  Cough  This is a new problem. The current episode started in the past 7 days. The problem has been unchanged. The problem occurs every few minutes. The cough is non-productive. Pertinent negatives include no chest pain, chills, ear pain, fever, headaches, postnasal drip, rash, sore throat, shortness of breath, sweats or wheezing. There is no history of environmental allergies.  Hypertension  This is a chronic problem. The problem is controlled. Pertinent negatives include no chest pain, headaches, palpitations, shortness of breath or sweats. Past treatments include calcium channel blockers. The current treatment provides significant improvement.  Diabetes  She has type 2 diabetes mellitus. Pertinent negatives for hypoglycemia include no dizziness, headaches or sweats. Pertinent negatives for diabetes include no chest pain. Symptoms are stable (A1C remain elevated - followed by endocrinology).  Chest wall pain - pt tripped over a toy at home and hit her sternum on a chair back.  It has been tender, but not bruised. It does not hurt to take a deep breath or cough.    Review of Systems  Constitutional: Negative for chills and fever.  HENT: Positive for voice change. Negative for ear pain, postnasal drip, sinus pain, sore throat and trouble swallowing.   Eyes: Negative for visual disturbance.  Respiratory: Positive for cough. Negative for chest tightness, shortness of breath and wheezing.   Cardiovascular: Negative for chest pain and palpitations.  Gastrointestinal: Negative for abdominal pain.  Genitourinary: Negative for frequency.  Musculoskeletal: Negative for arthralgias, gait problem and joint swelling.  Skin: Negative for color change and rash.    Allergic/Immunologic: Negative for environmental allergies.  Neurological: Negative for dizziness and headaches.  Psychiatric/Behavioral: Negative for dysphoric mood.    Patient Active Problem List   Diagnosis Date Noted  . [place] anemia 12/22/2014  . Lump of breast, right 12/22/2014  . Type 2 diabetes mellitus with hyperglycemia ([place]) 12/22/2014  . Essential (primary) hypertension 12/22/2014  . Lipoma of back 12/22/2014  . Type 2 diabetes mellitus ([place]) 01/03/2014    Prior to Admission medications   Medication Sig Start Date End Date Taking? Authorizing Provider  amLODipine (NORVASC) 10 MG tablet Take 1 tablet by mouth daily. 07/28/15  Yes [provider]  aspirin 81 MG chewable tablet Chew 1 tablet by mouth daily.   Yes [provider]  B-D ULTRAFINE III SHORT PEN 31G X 8 MM MISC USE AS DIRECTED. USE 4 NEEDLES A DAY WITH DIABETIC MEDICATION PENS AS DIRECTED 10/07/14  Yes [provider]  GLUCOSE BLOOD VI  03/01/14  Yes [provider]  losartan (COZAAR) 100 MG tablet Take 1 tablet by mouth daily. 07/21/15 04/26/17 Yes [provider]  NOVOLOG MIX 70/30 FLEXPEN (70-30) 100 UNIT/ML FlexPen TAKE 10 UNITS 30 MINUTES BEFORE BREAKFAST AND 8 UNITS 30 MINUTES BEFORE SUPPER. 12/16/14  Yes [provider]    Allergies  Allergen Reactions  . Sulfa Antibiotics Nausea Only    Past Surgical History:  Procedure Laterality Date  . ABDOMINAL HYSTERECTOMY    . CHOLECYSTECTOMY    . CYST REMOVAL NECK      Social History   Tobacco Use  . Smoking status: Never Smoker  . Smokeless tobacco: Never Used  . Tobacco comment: smoking  cessation materials not required  Substance Use Topics  . Alcohol use: No    Alcohol/week: 0.0 oz  . Drug use: No     Medication list has been reviewed and updated.  PHQ 2/9 Scores 03/15/2017 03/07/2016 12/31/2014  PHQ - 2 Score 0 0 0    Physical Exam  Constitutional: She is oriented to person, place, and time.  She appears well-developed. No distress.  HENT:  Head: Normocephalic and atraumatic.  Right Ear: Tympanic membrane and ear canal normal.  Left Ear: Tympanic membrane and ear canal normal.  Nose: Right sinus exhibits no maxillary sinus tenderness and no frontal sinus tenderness. Left sinus exhibits no maxillary sinus tenderness and no frontal sinus tenderness.  Mouth/Throat: No posterior oropharyngeal erythema.  Neck: Normal range of motion.  Cardiovascular: Normal rate, regular rhythm and normal heart sounds.  Pulmonary/Chest: Effort normal and breath sounds normal. No respiratory distress. She has no wheezes.    Musculoskeletal: Normal range of motion.  Neurological: She is alert and oriented to person, place, and time.  Skin: Skin is warm and dry. No rash noted.  Psychiatric: She has a normal mood and affect. Her speech is normal and behavior is normal. Thought content normal.  Nursing note and vitals reviewed.   BP 102/62   Pulse (!) 102   Temp 98.2 F (36.8 C) (Oral)   Ht 5\' 4"  (1.626 m)   Wt 119 lb (54 kg)   SpO2 96%   BMI 20.43 kg/m   Assessment and Plan: 1. Viral URI Take Xyzal for symptom relief (sample given)  2. Chest pain, muscular Resolving - pt reassured  3. Type 2 diabetes mellitus with hyperglycemia, with long-term current use of insulin ([place]) Followed by endocrinology  4. Essential (primary) hypertension controlled   No orders of the defined types were placed in this encounter.   Partially dictated using Editor, commissioning. Any errors are unintentional.  Halina Maidens, MD [facility]  04/26/2017

## 2017-04-30 ENCOUNTER — Ambulatory Visit
Admit: 2017-04-30 | Discharge: 2017-05-01 | Disposition: A | Discharge status: Home / Self Care | Payer: MEDICARE | Admitting: Hospitalist

## 2017-04-30 DIAGNOSIS — R0602 Shortness of breath: Principal | ICD-10-CM

## 2017-04-30 DIAGNOSIS — I517 Cardiomegaly: Secondary | ICD-10-CM | POA: Diagnosis not present

## 2017-04-30 DIAGNOSIS — I340 Nonrheumatic mitral (valve) insufficiency: Secondary | ICD-10-CM | POA: Diagnosis not present

## 2017-04-30 DIAGNOSIS — I361 Nonrheumatic tricuspid (valve) insufficiency: Secondary | ICD-10-CM | POA: Diagnosis not present

## 2017-04-30 DIAGNOSIS — K769 Liver disease, unspecified: Secondary | ICD-10-CM | POA: Diagnosis not present

## 2017-04-30 DIAGNOSIS — E119 Type 2 diabetes mellitus without complications: Secondary | ICD-10-CM | POA: Diagnosis not present

## 2017-04-30 DIAGNOSIS — Z8509 Personal history of malignant neoplasm of other digestive organs: Secondary | ICD-10-CM | POA: Diagnosis not present

## 2017-04-30 DIAGNOSIS — Z794 Long term (current) use of insulin: Secondary | ICD-10-CM | POA: Diagnosis not present

## 2017-04-30 DIAGNOSIS — I351 Nonrheumatic aortic (valve) insufficiency: Secondary | ICD-10-CM | POA: Diagnosis not present

## 2017-04-30 DIAGNOSIS — I10 Essential (primary) hypertension: Secondary | ICD-10-CM | POA: Diagnosis not present

## 2017-04-30 DIAGNOSIS — I4891 Unspecified atrial fibrillation: Secondary | ICD-10-CM | POA: Diagnosis not present

## 2017-04-30 DIAGNOSIS — Z882 Allergy status to sulfonamides status: Secondary | ICD-10-CM | POA: Diagnosis not present

## 2017-04-30 DIAGNOSIS — C3412 Malignant neoplasm of upper lobe, left bronchus or lung: Secondary | ICD-10-CM | POA: Diagnosis not present

## 2017-04-30 DIAGNOSIS — Z7982 Long term (current) use of aspirin: Secondary | ICD-10-CM | POA: Diagnosis not present

## 2017-04-30 DIAGNOSIS — R599 Enlarged lymph nodes, unspecified: Secondary | ICD-10-CM | POA: Diagnosis present

## 2017-04-30 DIAGNOSIS — C799 Secondary malignant neoplasm of unspecified site: Secondary | ICD-10-CM | POA: Diagnosis present

## 2017-04-30 DIAGNOSIS — R51 Headache: Secondary | ICD-10-CM | POA: Diagnosis not present

## 2017-04-30 DIAGNOSIS — R918 Other nonspecific abnormal finding of lung field: Secondary | ICD-10-CM | POA: Diagnosis not present

## 2017-04-30 DIAGNOSIS — I519 Heart disease, unspecified: Secondary | ICD-10-CM | POA: Diagnosis not present

## 2017-04-30 DIAGNOSIS — J90 Pleural effusion, not elsewhere classified: Secondary | ICD-10-CM | POA: Diagnosis not present

## 2017-04-30 DIAGNOSIS — Z90710 Acquired absence of both cervix and uterus: Secondary | ICD-10-CM | POA: Diagnosis not present

## 2017-04-30 DIAGNOSIS — I313 Pericardial effusion (noninflammatory): Secondary | ICD-10-CM | POA: Diagnosis not present

## 2017-04-30 DIAGNOSIS — M7989 Other specified soft tissue disorders: Secondary | ICD-10-CM | POA: Diagnosis not present

## 2017-04-30 DIAGNOSIS — C3490 Malignant neoplasm of unspecified part of unspecified bronchus or lung: Secondary | ICD-10-CM | POA: Diagnosis present

## 2017-04-30 DIAGNOSIS — Z9049 Acquired absence of other specified parts of digestive tract: Secondary | ICD-10-CM | POA: Diagnosis not present

## 2017-04-30 DIAGNOSIS — I2699 Other pulmonary embolism without acute cor pulmonale: Secondary | ICD-10-CM | POA: Diagnosis not present

## 2017-04-30 DIAGNOSIS — J910 Malignant pleural effusion: Secondary | ICD-10-CM | POA: Diagnosis not present

## 2017-05-01 DIAGNOSIS — R0602 Shortness of breath: Principal | ICD-10-CM

## 2017-05-01 MED ORDER — LOSARTAN POTASSIUM 100 MG PO TABS
100.00 mg | ORAL_TABLET | ORAL | Status: DC
Start: 2017-05-02 — End: 2017-05-01

## 2017-05-01 MED ORDER — INSULIN LISPRO 100 UNIT/ML ~~LOC~~ SOLN
0.00 | SUBCUTANEOUS | Status: DC
Start: 2017-05-01 — End: 2017-05-01

## 2017-05-01 MED ORDER — DEXTROSE 50 % IV SOLN
12.50 | INTRAVENOUS | Status: DC
Start: ? — End: 2017-05-01

## 2017-05-01 MED ORDER — INSULIN NPH ISOPHANE & REGULAR (70-30) 100 UNIT/ML ~~LOC~~ SUSP
6.00 | SUBCUTANEOUS | Status: DC
Start: 2017-05-02 — End: 2017-05-01

## 2017-05-01 MED ORDER — ALBUTEROL SULFATE (2.5 MG/3ML) 0.083% IN NEBU
2.50 mg | INHALATION_SOLUTION | RESPIRATORY_TRACT | Status: DC
Start: ? — End: 2017-05-01

## 2017-05-01 MED ORDER — IPRATROPIUM BROMIDE 0.02 % IN SOLN
500.00 | RESPIRATORY_TRACT | Status: DC
Start: ? — End: 2017-05-01

## 2017-05-01 MED ORDER — AMLODIPINE BESYLATE 10 MG PO TABS
10.00 mg | ORAL_TABLET | ORAL | Status: DC
Start: 2017-05-02 — End: 2017-05-01

## 2017-05-01 MED ORDER — ACETAMINOPHEN 325 MG PO TABS
650.00 mg | ORAL_TABLET | ORAL | Status: DC
Start: ? — End: 2017-05-01

## 2017-05-01 MED ORDER — APIXABAN 5 MG TABLET
ORAL_TABLET | 1 refills | 0 days | Status: CP
Start: 2017-05-01 — End: 2017-07-05

## 2017-05-10 ENCOUNTER — Ambulatory Visit
Admit: 2017-05-10 | Discharge: 2017-05-19 | Disposition: A | Discharge status: Home Health | Payer: MEDICARE | Admitting: Pulmonary Disease

## 2017-05-10 DIAGNOSIS — R0902 Hypoxemia: Principal | ICD-10-CM

## 2017-05-10 DIAGNOSIS — C787 Secondary malignant neoplasm of liver and intrahepatic bile duct: Secondary | ICD-10-CM | POA: Diagnosis not present

## 2017-05-10 DIAGNOSIS — R079 Chest pain, unspecified: Secondary | ICD-10-CM | POA: Diagnosis not present

## 2017-05-10 DIAGNOSIS — R05 Cough: Secondary | ICD-10-CM | POA: Diagnosis not present

## 2017-05-10 DIAGNOSIS — R0602 Shortness of breath: Secondary | ICD-10-CM | POA: Diagnosis not present

## 2017-05-10 DIAGNOSIS — R918 Other nonspecific abnormal finding of lung field: Secondary | ICD-10-CM | POA: Diagnosis not present

## 2017-05-10 DIAGNOSIS — R000 Tachycardia, unspecified: Secondary | ICD-10-CM | POA: Diagnosis not present

## 2017-05-10 DIAGNOSIS — Z794 Long term (current) use of insulin: Secondary | ICD-10-CM | POA: Diagnosis not present

## 2017-05-10 DIAGNOSIS — J90 Pleural effusion, not elsewhere classified: Secondary | ICD-10-CM | POA: Diagnosis not present

## 2017-05-10 DIAGNOSIS — E119 Type 2 diabetes mellitus without complications: Secondary | ICD-10-CM | POA: Diagnosis not present

## 2017-05-10 DIAGNOSIS — I2782 Chronic pulmonary embolism: Secondary | ICD-10-CM | POA: Diagnosis not present

## 2017-05-10 DIAGNOSIS — I4510 Unspecified right bundle-branch block: Secondary | ICD-10-CM | POA: Diagnosis not present

## 2017-05-10 DIAGNOSIS — C779 Secondary and unspecified malignant neoplasm of lymph node, unspecified: Secondary | ICD-10-CM | POA: Diagnosis not present

## 2017-05-10 DIAGNOSIS — I10 Essential (primary) hypertension: Secondary | ICD-10-CM | POA: Diagnosis not present

## 2017-05-10 DIAGNOSIS — Z86711 Personal history of pulmonary embolism: Secondary | ICD-10-CM | POA: Diagnosis not present

## 2017-05-10 DIAGNOSIS — E440 Moderate protein-calorie malnutrition: Secondary | ICD-10-CM | POA: Diagnosis not present

## 2017-05-10 DIAGNOSIS — C3411 Malignant neoplasm of upper lobe, right bronchus or lung: Secondary | ICD-10-CM | POA: Diagnosis not present

## 2017-05-10 DIAGNOSIS — I2699 Other pulmonary embolism without acute cor pulmonale: Secondary | ICD-10-CM | POA: Diagnosis not present

## 2017-05-11 DIAGNOSIS — R0902 Hypoxemia: Principal | ICD-10-CM

## 2017-05-11 DIAGNOSIS — E1165 Type 2 diabetes mellitus with hyperglycemia: Secondary | ICD-10-CM | POA: Diagnosis not present

## 2017-05-11 DIAGNOSIS — I2699 Other pulmonary embolism without acute cor pulmonale: Secondary | ICD-10-CM | POA: Diagnosis not present

## 2017-05-11 DIAGNOSIS — R0609 Other forms of dyspnea: Secondary | ICD-10-CM | POA: Diagnosis not present

## 2017-05-11 DIAGNOSIS — I313 Pericardial effusion (noninflammatory): Secondary | ICD-10-CM | POA: Diagnosis not present

## 2017-05-11 DIAGNOSIS — Z794 Long term (current) use of insulin: Secondary | ICD-10-CM | POA: Diagnosis not present

## 2017-05-11 DIAGNOSIS — I10 Essential (primary) hypertension: Secondary | ICD-10-CM | POA: Diagnosis not present

## 2017-05-11 DIAGNOSIS — J90 Pleural effusion, not elsewhere classified: Secondary | ICD-10-CM | POA: Diagnosis not present

## 2017-05-11 DIAGNOSIS — R591 Generalized enlarged lymph nodes: Secondary | ICD-10-CM | POA: Diagnosis not present

## 2017-05-11 DIAGNOSIS — D491 Neoplasm of unspecified behavior of respiratory system: Secondary | ICD-10-CM | POA: Diagnosis not present

## 2017-05-11 DIAGNOSIS — C3490 Malignant neoplasm of unspecified part of unspecified bronchus or lung: Secondary | ICD-10-CM | POA: Diagnosis not present

## 2017-05-12 DIAGNOSIS — R0902 Hypoxemia: Principal | ICD-10-CM

## 2017-05-12 DIAGNOSIS — Z4682 Encounter for fitting and adjustment of non-vascular catheter: Secondary | ICD-10-CM | POA: Diagnosis not present

## 2017-05-12 DIAGNOSIS — C3411 Malignant neoplasm of upper lobe, right bronchus or lung: Secondary | ICD-10-CM | POA: Diagnosis present

## 2017-05-12 DIAGNOSIS — E440 Moderate protein-calorie malnutrition: Secondary | ICD-10-CM | POA: Diagnosis present

## 2017-05-12 DIAGNOSIS — Z6821 Body mass index (BMI) 21.0-21.9, adult: Secondary | ICD-10-CM | POA: Diagnosis not present

## 2017-05-12 DIAGNOSIS — C787 Secondary malignant neoplasm of liver and intrahepatic bile duct: Secondary | ICD-10-CM | POA: Diagnosis present

## 2017-05-12 DIAGNOSIS — Z7901 Long term (current) use of anticoagulants: Secondary | ICD-10-CM | POA: Diagnosis not present

## 2017-05-12 DIAGNOSIS — I10 Essential (primary) hypertension: Secondary | ICD-10-CM | POA: Diagnosis not present

## 2017-05-12 DIAGNOSIS — Z9049 Acquired absence of other specified parts of digestive tract: Secondary | ICD-10-CM | POA: Diagnosis not present

## 2017-05-12 DIAGNOSIS — R160 Hepatomegaly, not elsewhere classified: Secondary | ICD-10-CM | POA: Diagnosis not present

## 2017-05-12 DIAGNOSIS — E1165 Type 2 diabetes mellitus with hyperglycemia: Secondary | ICD-10-CM | POA: Diagnosis not present

## 2017-05-12 DIAGNOSIS — C3491 Malignant neoplasm of unspecified part of right bronchus or lung: Secondary | ICD-10-CM | POA: Diagnosis not present

## 2017-05-12 DIAGNOSIS — R911 Solitary pulmonary nodule: Secondary | ICD-10-CM | POA: Diagnosis not present

## 2017-05-12 DIAGNOSIS — I2699 Other pulmonary embolism without acute cor pulmonale: Secondary | ICD-10-CM | POA: Diagnosis not present

## 2017-05-12 DIAGNOSIS — Z794 Long term (current) use of insulin: Secondary | ICD-10-CM | POA: Diagnosis not present

## 2017-05-12 DIAGNOSIS — R918 Other nonspecific abnormal finding of lung field: Secondary | ICD-10-CM | POA: Diagnosis not present

## 2017-05-12 DIAGNOSIS — J90 Pleural effusion, not elsewhere classified: Secondary | ICD-10-CM | POA: Diagnosis not present

## 2017-05-12 DIAGNOSIS — C801 Malignant (primary) neoplasm, unspecified: Secondary | ICD-10-CM | POA: Diagnosis not present

## 2017-05-12 DIAGNOSIS — J9811 Atelectasis: Secondary | ICD-10-CM | POA: Diagnosis not present

## 2017-05-12 DIAGNOSIS — I4891 Unspecified atrial fibrillation: Secondary | ICD-10-CM | POA: Diagnosis present

## 2017-05-12 DIAGNOSIS — R32 Unspecified urinary incontinence: Secondary | ICD-10-CM | POA: Diagnosis present

## 2017-05-12 DIAGNOSIS — F09 Unspecified mental disorder due to known physiological condition: Secondary | ICD-10-CM | POA: Diagnosis present

## 2017-05-12 DIAGNOSIS — C3492 Malignant neoplasm of unspecified part of left bronchus or lung: Secondary | ICD-10-CM | POA: Diagnosis not present

## 2017-05-12 DIAGNOSIS — I2729 Other secondary pulmonary hypertension: Secondary | ICD-10-CM | POA: Diagnosis present

## 2017-05-12 DIAGNOSIS — C779 Secondary and unspecified malignant neoplasm of lymph node, unspecified: Secondary | ICD-10-CM | POA: Diagnosis present

## 2017-05-12 DIAGNOSIS — D491 Neoplasm of unspecified behavior of respiratory system: Secondary | ICD-10-CM | POA: Diagnosis not present

## 2017-05-12 DIAGNOSIS — E119 Type 2 diabetes mellitus without complications: Secondary | ICD-10-CM | POA: Diagnosis present

## 2017-05-12 DIAGNOSIS — C3490 Malignant neoplasm of unspecified part of unspecified bronchus or lung: Secondary | ICD-10-CM | POA: Diagnosis not present

## 2017-05-12 DIAGNOSIS — I2782 Chronic pulmonary embolism: Secondary | ICD-10-CM | POA: Diagnosis present

## 2017-05-12 DIAGNOSIS — Z90710 Acquired absence of both cervix and uterus: Secondary | ICD-10-CM | POA: Diagnosis not present

## 2017-05-13 DIAGNOSIS — R0902 Hypoxemia: Principal | ICD-10-CM

## 2017-05-14 DIAGNOSIS — R0902 Hypoxemia: Principal | ICD-10-CM

## 2017-05-15 DIAGNOSIS — R0902 Hypoxemia: Principal | ICD-10-CM

## 2017-05-17 DIAGNOSIS — R0902 Hypoxemia: Principal | ICD-10-CM

## 2017-05-18 DIAGNOSIS — R0902 Hypoxemia: Principal | ICD-10-CM

## 2017-05-19 DIAGNOSIS — Z7901 Long term (current) use of anticoagulants: Secondary | ICD-10-CM

## 2017-05-19 DIAGNOSIS — R160 Hepatomegaly, not elsewhere classified: Secondary | ICD-10-CM

## 2017-05-19 DIAGNOSIS — I2699 Other pulmonary embolism without acute cor pulmonale: Principal | ICD-10-CM

## 2017-05-19 DIAGNOSIS — R0902 Hypoxemia: Principal | ICD-10-CM

## 2017-05-19 DIAGNOSIS — I4891 Unspecified atrial fibrillation: Secondary | ICD-10-CM

## 2017-05-19 DIAGNOSIS — E119 Type 2 diabetes mellitus without complications: Secondary | ICD-10-CM

## 2017-05-19 DIAGNOSIS — R918 Other nonspecific abnormal finding of lung field: Secondary | ICD-10-CM

## 2017-05-19 DIAGNOSIS — I10 Essential (primary) hypertension: Secondary | ICD-10-CM

## 2017-05-19 DIAGNOSIS — E440 Moderate protein-calorie malnutrition: Secondary | ICD-10-CM

## 2017-05-19 DIAGNOSIS — Z794 Long term (current) use of insulin: Secondary | ICD-10-CM

## 2017-05-19 MED ORDER — DEXTROSE 50 % IV SOLN
12.50 | INTRAVENOUS | Status: DC
Start: ? — End: 2017-05-19

## 2017-05-19 MED ORDER — LIDOCAINE 5 % EX PTCH
1.00 | MEDICATED_PATCH | CUTANEOUS | Status: DC
Start: 2017-05-20 — End: 2017-05-19

## 2017-05-19 MED ORDER — ACETAMINOPHEN 325 MG PO TABS
650.00 mg | ORAL_TABLET | ORAL | Status: DC
Start: ? — End: 2017-05-19

## 2017-05-19 MED ORDER — LOSARTAN POTASSIUM 100 MG PO TABS
100.00 mg | ORAL_TABLET | ORAL | Status: DC
Start: 2017-05-20 — End: 2017-05-19

## 2017-05-19 MED ORDER — INSULIN LISPRO 100 UNIT/ML ~~LOC~~ SOLN
.00 | SUBCUTANEOUS | Status: DC
Start: 2017-05-19 — End: 2017-05-19

## 2017-05-19 MED ORDER — INSULIN LISPRO 100 UNIT/ML ~~LOC~~ SOLN
5.00 | SUBCUTANEOUS | Status: DC
Start: 2017-05-20 — End: 2017-05-19

## 2017-05-19 MED ORDER — POLYETHYLENE GLYCOL 3350 17 G PO PACK
17.00 | PACK | ORAL | Status: DC
Start: 2017-05-19 — End: 2017-05-19

## 2017-05-19 MED ORDER — INSULIN NPH (HUMAN) (ISOPHANE) 100 UNIT/ML ~~LOC~~ SUSP
8.00 | SUBCUTANEOUS | Status: DC
Start: 2017-05-19 — End: 2017-05-19

## 2017-05-19 MED ORDER — SENNOSIDES 8.6 MG PO TABS
2.00 | ORAL_TABLET | ORAL | Status: DC
Start: 2017-05-19 — End: 2017-05-19

## 2017-05-19 MED ORDER — HEPARIN SODIUM (PORCINE) 1000 UNIT/ML IJ SOLN
5000.00 | INTRAMUSCULAR | Status: DC
Start: ? — End: 2017-05-19

## 2017-05-19 MED ORDER — MELATONIN 3 MG PO TABS
6.00 mg | ORAL_TABLET | ORAL | Status: DC
Start: ? — End: 2017-05-19

## 2017-05-19 MED ORDER — AMLODIPINE BESYLATE 10 MG PO TABS
10.00 mg | ORAL_TABLET | ORAL | Status: DC
Start: 2017-05-20 — End: 2017-05-19

## 2017-05-20 ENCOUNTER — Encounter: Admit: 2017-05-20 | Discharge: 2017-06-16 | Payer: MEDICARE

## 2017-05-20 ENCOUNTER — Inpatient Hospital Stay: Admit: 2017-05-20 | Discharge: 2017-06-16 | Payer: MEDICARE

## 2017-05-20 DIAGNOSIS — Z7901 Long term (current) use of anticoagulants: Secondary | ICD-10-CM

## 2017-05-20 DIAGNOSIS — I4891 Unspecified atrial fibrillation: Secondary | ICD-10-CM

## 2017-05-20 DIAGNOSIS — E440 Moderate protein-calorie malnutrition: Secondary | ICD-10-CM

## 2017-05-20 DIAGNOSIS — R160 Hepatomegaly, not elsewhere classified: Secondary | ICD-10-CM

## 2017-05-20 DIAGNOSIS — R918 Other nonspecific abnormal finding of lung field: Secondary | ICD-10-CM

## 2017-05-20 DIAGNOSIS — I2699 Other pulmonary embolism without acute cor pulmonale: Principal | ICD-10-CM

## 2017-05-20 DIAGNOSIS — E119 Type 2 diabetes mellitus without complications: Secondary | ICD-10-CM

## 2017-05-20 DIAGNOSIS — Z794 Long term (current) use of insulin: Secondary | ICD-10-CM

## 2017-05-20 DIAGNOSIS — I10 Essential (primary) hypertension: Secondary | ICD-10-CM

## 2017-05-20 MED ORDER — AMLODIPINE BESYLATE 10 MG PO TABS
10.00 mg | ORAL_TABLET | ORAL | Status: DC
Start: 2017-05-20 — End: 2017-05-20

## 2017-05-20 MED ORDER — DEXTROSE 50 % IV SOLN
12.50 | INTRAVENOUS | Status: DC
Start: ? — End: 2017-05-20

## 2017-05-20 MED ORDER — ACETAMINOPHEN 325 MG PO TABS
650.00 mg | ORAL_TABLET | ORAL | Status: DC
Start: ? — End: 2017-05-20

## 2017-05-20 MED ORDER — MELATONIN 3 MG PO TABS
6.00 mg | ORAL_TABLET | ORAL | Status: DC
Start: ? — End: 2017-05-20

## 2017-05-20 MED ORDER — LOSARTAN POTASSIUM 100 MG PO TABS
100.00 mg | ORAL_TABLET | ORAL | Status: DC
Start: 2017-05-20 — End: 2017-05-20

## 2017-05-22 DIAGNOSIS — I2699 Other pulmonary embolism without acute cor pulmonale: Principal | ICD-10-CM

## 2017-05-22 DIAGNOSIS — E440 Moderate protein-calorie malnutrition: Secondary | ICD-10-CM

## 2017-05-22 DIAGNOSIS — R160 Hepatomegaly, not elsewhere classified: Secondary | ICD-10-CM

## 2017-05-22 DIAGNOSIS — I4891 Unspecified atrial fibrillation: Secondary | ICD-10-CM

## 2017-05-22 DIAGNOSIS — E119 Type 2 diabetes mellitus without complications: Secondary | ICD-10-CM

## 2017-05-22 DIAGNOSIS — Z794 Long term (current) use of insulin: Secondary | ICD-10-CM

## 2017-05-22 DIAGNOSIS — Z7901 Long term (current) use of anticoagulants: Secondary | ICD-10-CM

## 2017-05-22 DIAGNOSIS — I10 Essential (primary) hypertension: Secondary | ICD-10-CM

## 2017-05-22 DIAGNOSIS — R918 Other nonspecific abnormal finding of lung field: Secondary | ICD-10-CM

## 2017-05-23 ENCOUNTER — Emergency Department: Admit: 2017-05-23 | Discharge: 2017-05-23 | Disposition: A | Discharge status: Home / Self Care | Payer: MEDICARE

## 2017-05-23 ENCOUNTER — Ambulatory Visit: Admit: 2017-05-23 | Discharge: 2017-05-23 | Disposition: A | Discharge status: Home / Self Care | Payer: MEDICARE

## 2017-05-23 DIAGNOSIS — R064 Hyperventilation: Secondary | ICD-10-CM

## 2017-05-23 DIAGNOSIS — R0789 Other chest pain: Principal | ICD-10-CM

## 2017-05-23 DIAGNOSIS — Z79899 Other long term (current) drug therapy: Secondary | ICD-10-CM | POA: Diagnosis not present

## 2017-05-23 DIAGNOSIS — Z794 Long term (current) use of insulin: Secondary | ICD-10-CM | POA: Diagnosis not present

## 2017-05-23 DIAGNOSIS — I4510 Unspecified right bundle-branch block: Secondary | ICD-10-CM | POA: Diagnosis not present

## 2017-05-23 DIAGNOSIS — I10 Essential (primary) hypertension: Secondary | ICD-10-CM | POA: Diagnosis not present

## 2017-05-23 DIAGNOSIS — Z9049 Acquired absence of other specified parts of digestive tract: Secondary | ICD-10-CM | POA: Diagnosis not present

## 2017-05-23 DIAGNOSIS — J90 Pleural effusion, not elsewhere classified: Secondary | ICD-10-CM | POA: Diagnosis not present

## 2017-05-23 DIAGNOSIS — M7989 Other specified soft tissue disorders: Secondary | ICD-10-CM | POA: Diagnosis not present

## 2017-05-23 DIAGNOSIS — R079 Chest pain, unspecified: Secondary | ICD-10-CM | POA: Diagnosis not present

## 2017-05-23 DIAGNOSIS — R9431 Abnormal electrocardiogram [ECG] [EKG]: Secondary | ICD-10-CM | POA: Diagnosis not present

## 2017-05-23 DIAGNOSIS — I4891 Unspecified atrial fibrillation: Secondary | ICD-10-CM | POA: Diagnosis not present

## 2017-05-23 DIAGNOSIS — R600 Localized edema: Secondary | ICD-10-CM | POA: Diagnosis not present

## 2017-05-23 DIAGNOSIS — Z7901 Long term (current) use of anticoagulants: Secondary | ICD-10-CM | POA: Diagnosis not present

## 2017-05-23 DIAGNOSIS — R918 Other nonspecific abnormal finding of lung field: Secondary | ICD-10-CM | POA: Diagnosis not present

## 2017-05-23 DIAGNOSIS — E119 Type 2 diabetes mellitus without complications: Secondary | ICD-10-CM | POA: Diagnosis not present

## 2017-05-23 DIAGNOSIS — Z882 Allergy status to sulfonamides status: Secondary | ICD-10-CM | POA: Diagnosis not present

## 2017-05-24 DIAGNOSIS — E440 Moderate protein-calorie malnutrition: Secondary | ICD-10-CM

## 2017-05-24 DIAGNOSIS — R918 Other nonspecific abnormal finding of lung field: Secondary | ICD-10-CM

## 2017-05-24 DIAGNOSIS — I2699 Other pulmonary embolism without acute cor pulmonale: Principal | ICD-10-CM

## 2017-05-24 DIAGNOSIS — Z794 Long term (current) use of insulin: Secondary | ICD-10-CM

## 2017-05-24 DIAGNOSIS — I4891 Unspecified atrial fibrillation: Secondary | ICD-10-CM

## 2017-05-24 DIAGNOSIS — I10 Essential (primary) hypertension: Secondary | ICD-10-CM

## 2017-05-24 DIAGNOSIS — Z7901 Long term (current) use of anticoagulants: Secondary | ICD-10-CM

## 2017-05-24 DIAGNOSIS — R160 Hepatomegaly, not elsewhere classified: Secondary | ICD-10-CM

## 2017-05-24 DIAGNOSIS — E119 Type 2 diabetes mellitus without complications: Secondary | ICD-10-CM

## 2017-05-25 DIAGNOSIS — Z794 Long term (current) use of insulin: Secondary | ICD-10-CM

## 2017-05-25 DIAGNOSIS — R918 Other nonspecific abnormal finding of lung field: Secondary | ICD-10-CM

## 2017-05-25 DIAGNOSIS — E440 Moderate protein-calorie malnutrition: Secondary | ICD-10-CM

## 2017-05-25 DIAGNOSIS — I4891 Unspecified atrial fibrillation: Secondary | ICD-10-CM

## 2017-05-25 DIAGNOSIS — R160 Hepatomegaly, not elsewhere classified: Secondary | ICD-10-CM

## 2017-05-25 DIAGNOSIS — Z7901 Long term (current) use of anticoagulants: Secondary | ICD-10-CM

## 2017-05-25 DIAGNOSIS — I10 Essential (primary) hypertension: Secondary | ICD-10-CM

## 2017-05-25 DIAGNOSIS — I2699 Other pulmonary embolism without acute cor pulmonale: Principal | ICD-10-CM

## 2017-05-25 DIAGNOSIS — E119 Type 2 diabetes mellitus without complications: Secondary | ICD-10-CM

## 2017-05-28 ENCOUNTER — Ambulatory Visit
Admit: 2017-05-28 | Discharge: 2017-05-28 | Disposition: A | Discharge status: Home / Self Care | Payer: MEDICARE | Attending: Emergency Medicine

## 2017-05-28 ENCOUNTER — Emergency Department
Admit: 2017-05-28 | Discharge: 2017-05-28 | Disposition: A | Discharge status: Home / Self Care | Payer: MEDICARE | Attending: Emergency Medicine

## 2017-05-28 DIAGNOSIS — R079 Chest pain, unspecified: Principal | ICD-10-CM

## 2017-05-28 DIAGNOSIS — R071 Chest pain on breathing: Secondary | ICD-10-CM | POA: Diagnosis not present

## 2017-05-28 DIAGNOSIS — I10 Essential (primary) hypertension: Secondary | ICD-10-CM | POA: Diagnosis not present

## 2017-05-28 DIAGNOSIS — Z9049 Acquired absence of other specified parts of digestive tract: Secondary | ICD-10-CM | POA: Diagnosis not present

## 2017-05-28 DIAGNOSIS — R918 Other nonspecific abnormal finding of lung field: Secondary | ICD-10-CM | POA: Diagnosis not present

## 2017-05-28 DIAGNOSIS — I4891 Unspecified atrial fibrillation: Secondary | ICD-10-CM | POA: Diagnosis not present

## 2017-05-28 DIAGNOSIS — Z86711 Personal history of pulmonary embolism: Secondary | ICD-10-CM | POA: Diagnosis not present

## 2017-05-28 DIAGNOSIS — J90 Pleural effusion, not elsewhere classified: Secondary | ICD-10-CM | POA: Diagnosis not present

## 2017-05-28 DIAGNOSIS — Z7901 Long term (current) use of anticoagulants: Secondary | ICD-10-CM | POA: Diagnosis not present

## 2017-05-28 DIAGNOSIS — E119 Type 2 diabetes mellitus without complications: Secondary | ICD-10-CM | POA: Diagnosis not present

## 2017-05-28 DIAGNOSIS — Z882 Allergy status to sulfonamides status: Secondary | ICD-10-CM | POA: Diagnosis not present

## 2017-05-28 DIAGNOSIS — R0602 Shortness of breath: Secondary | ICD-10-CM | POA: Diagnosis not present

## 2017-05-30 ENCOUNTER — Ambulatory Visit
Admit: 2017-05-30 | Discharge: 2017-05-31 | Payer: MEDICARE | Attending: Hematology & Oncology | Primary: Hematology & Oncology

## 2017-05-30 ENCOUNTER — Ambulatory Visit: Admit: 2017-05-30 | Discharge: 2017-05-31 | Payer: MEDICARE | Attending: Pulmonary Disease | Primary: Pulmonary Disease

## 2017-05-30 DIAGNOSIS — Z7901 Long term (current) use of anticoagulants: Secondary | ICD-10-CM

## 2017-05-30 DIAGNOSIS — E440 Moderate protein-calorie malnutrition: Secondary | ICD-10-CM

## 2017-05-30 DIAGNOSIS — I10 Essential (primary) hypertension: Secondary | ICD-10-CM

## 2017-05-30 DIAGNOSIS — I4891 Unspecified atrial fibrillation: Secondary | ICD-10-CM

## 2017-05-30 DIAGNOSIS — R160 Hepatomegaly, not elsewhere classified: Secondary | ICD-10-CM

## 2017-05-30 DIAGNOSIS — I2699 Other pulmonary embolism without acute cor pulmonale: Principal | ICD-10-CM

## 2017-05-30 DIAGNOSIS — Z794 Long term (current) use of insulin: Secondary | ICD-10-CM

## 2017-05-30 DIAGNOSIS — E119 Type 2 diabetes mellitus without complications: Secondary | ICD-10-CM

## 2017-05-30 DIAGNOSIS — R918 Other nonspecific abnormal finding of lung field: Secondary | ICD-10-CM

## 2017-05-30 DIAGNOSIS — Z09 Encounter for follow-up examination after completed treatment for conditions other than malignant neoplasm: Principal | ICD-10-CM

## 2017-05-30 DIAGNOSIS — C3490 Malignant neoplasm of unspecified part of unspecified bronchus or lung: Principal | ICD-10-CM

## 2017-05-30 MED ORDER — FUROSEMIDE 20 MG TABLET
ORAL_TABLET | Freq: Every day | ORAL | 0 refills | 0.00000 days | Status: CP
Start: 2017-05-30 — End: 2017-06-21

## 2017-06-01 DIAGNOSIS — R160 Hepatomegaly, not elsewhere classified: Secondary | ICD-10-CM

## 2017-06-01 DIAGNOSIS — R918 Other nonspecific abnormal finding of lung field: Secondary | ICD-10-CM

## 2017-06-01 DIAGNOSIS — E119 Type 2 diabetes mellitus without complications: Secondary | ICD-10-CM

## 2017-06-01 DIAGNOSIS — I10 Essential (primary) hypertension: Secondary | ICD-10-CM

## 2017-06-01 DIAGNOSIS — E440 Moderate protein-calorie malnutrition: Secondary | ICD-10-CM

## 2017-06-01 DIAGNOSIS — I4891 Unspecified atrial fibrillation: Secondary | ICD-10-CM

## 2017-06-01 DIAGNOSIS — Z7901 Long term (current) use of anticoagulants: Secondary | ICD-10-CM

## 2017-06-01 DIAGNOSIS — Z794 Long term (current) use of insulin: Secondary | ICD-10-CM

## 2017-06-01 DIAGNOSIS — I2699 Other pulmonary embolism without acute cor pulmonale: Principal | ICD-10-CM

## 2017-06-02 DIAGNOSIS — I10 Essential (primary) hypertension: Secondary | ICD-10-CM

## 2017-06-02 DIAGNOSIS — Z794 Long term (current) use of insulin: Secondary | ICD-10-CM

## 2017-06-02 DIAGNOSIS — I4891 Unspecified atrial fibrillation: Secondary | ICD-10-CM

## 2017-06-02 DIAGNOSIS — R918 Other nonspecific abnormal finding of lung field: Secondary | ICD-10-CM

## 2017-06-02 DIAGNOSIS — Z7901 Long term (current) use of anticoagulants: Secondary | ICD-10-CM

## 2017-06-02 DIAGNOSIS — I2699 Other pulmonary embolism without acute cor pulmonale: Principal | ICD-10-CM

## 2017-06-02 DIAGNOSIS — R160 Hepatomegaly, not elsewhere classified: Secondary | ICD-10-CM

## 2017-06-02 DIAGNOSIS — E440 Moderate protein-calorie malnutrition: Secondary | ICD-10-CM

## 2017-06-02 DIAGNOSIS — E119 Type 2 diabetes mellitus without complications: Secondary | ICD-10-CM

## 2017-06-03 ENCOUNTER — Ambulatory Visit: Admit: 2017-06-03 | Discharge: 2017-06-04 | Payer: MEDICARE

## 2017-06-03 DIAGNOSIS — R079 Chest pain, unspecified: Principal | ICD-10-CM

## 2017-06-03 DIAGNOSIS — R0602 Shortness of breath: Secondary | ICD-10-CM | POA: Diagnosis not present

## 2017-06-03 DIAGNOSIS — I2699 Other pulmonary embolism without acute cor pulmonale: Secondary | ICD-10-CM | POA: Diagnosis not present

## 2017-06-03 DIAGNOSIS — Z9049 Acquired absence of other specified parts of digestive tract: Secondary | ICD-10-CM | POA: Diagnosis not present

## 2017-06-03 DIAGNOSIS — E119 Type 2 diabetes mellitus without complications: Secondary | ICD-10-CM | POA: Diagnosis not present

## 2017-06-03 DIAGNOSIS — I4510 Unspecified right bundle-branch block: Secondary | ICD-10-CM | POA: Diagnosis not present

## 2017-06-03 DIAGNOSIS — R0609 Other forms of dyspnea: Secondary | ICD-10-CM | POA: Diagnosis not present

## 2017-06-03 DIAGNOSIS — Z794 Long term (current) use of insulin: Secondary | ICD-10-CM | POA: Diagnosis not present

## 2017-06-03 DIAGNOSIS — R000 Tachycardia, unspecified: Secondary | ICD-10-CM | POA: Diagnosis not present

## 2017-06-03 DIAGNOSIS — J90 Pleural effusion, not elsewhere classified: Secondary | ICD-10-CM | POA: Diagnosis not present

## 2017-06-03 DIAGNOSIS — Z882 Allergy status to sulfonamides status: Secondary | ICD-10-CM | POA: Diagnosis not present

## 2017-06-03 DIAGNOSIS — C787 Secondary malignant neoplasm of liver and intrahepatic bile duct: Secondary | ICD-10-CM | POA: Diagnosis not present

## 2017-06-03 DIAGNOSIS — M7989 Other specified soft tissue disorders: Secondary | ICD-10-CM | POA: Diagnosis not present

## 2017-06-03 DIAGNOSIS — R0789 Other chest pain: Secondary | ICD-10-CM | POA: Diagnosis not present

## 2017-06-03 DIAGNOSIS — I10 Essential (primary) hypertension: Secondary | ICD-10-CM | POA: Diagnosis not present

## 2017-06-03 DIAGNOSIS — R918 Other nonspecific abnormal finding of lung field: Secondary | ICD-10-CM | POA: Diagnosis not present

## 2017-06-03 DIAGNOSIS — C3490 Malignant neoplasm of unspecified part of unspecified bronchus or lung: Secondary | ICD-10-CM | POA: Diagnosis not present

## 2017-06-03 DIAGNOSIS — E876 Hypokalemia: Secondary | ICD-10-CM | POA: Diagnosis not present

## 2017-06-03 DIAGNOSIS — I4891 Unspecified atrial fibrillation: Secondary | ICD-10-CM | POA: Diagnosis not present

## 2017-06-03 DIAGNOSIS — Z7901 Long term (current) use of anticoagulants: Secondary | ICD-10-CM | POA: Diagnosis not present

## 2017-06-04 ENCOUNTER — Encounter: Payer: Self-pay | Admitting: Internal Medicine

## 2017-06-04 ENCOUNTER — Other Ambulatory Visit: Payer: Self-pay | Admitting: Internal Medicine

## 2017-06-04 DIAGNOSIS — R0609 Other forms of dyspnea: Secondary | ICD-10-CM | POA: Diagnosis not present

## 2017-06-04 DIAGNOSIS — C3490 Malignant neoplasm of unspecified part of unspecified bronchus or lung: Secondary | ICD-10-CM | POA: Diagnosis not present

## 2017-06-04 DIAGNOSIS — J90 Pleural effusion, not elsewhere classified: Secondary | ICD-10-CM | POA: Diagnosis not present

## 2017-06-04 DIAGNOSIS — E876 Hypokalemia: Secondary | ICD-10-CM | POA: Diagnosis not present

## 2017-06-04 MED ORDER — AMLODIPINE BESYLATE 10 MG PO TABS
10.00 | ORAL_TABLET | ORAL | Status: DC
Start: 2017-06-05 — End: 2017-06-04

## 2017-06-04 MED ORDER — INSULIN NPH (HUMAN) (ISOPHANE) 100 UNIT/ML ~~LOC~~ SUSP
7.00 | SUBCUTANEOUS | Status: DC
Start: 2017-06-05 — End: 2017-06-04

## 2017-06-04 MED ORDER — LOSARTAN POTASSIUM 25 MG PO TABS
100.00 | ORAL_TABLET | ORAL | Status: DC
Start: 2017-06-05 — End: 2017-06-04

## 2017-06-04 MED ORDER — INSULIN LISPRO 100 UNIT/ML ~~LOC~~ SOLN
2.00 | SUBCUTANEOUS | Status: DC
Start: 2017-06-04 — End: 2017-06-04

## 2017-06-04 MED ORDER — INSULIN NPH (HUMAN) (ISOPHANE) 100 UNIT/ML ~~LOC~~ SUSP
6.00 | SUBCUTANEOUS | Status: DC
Start: 2017-06-04 — End: 2017-06-04

## 2017-06-04 MED ORDER — DEXTROSE 50 % IV SOLN
12.50 | INTRAVENOUS | Status: DC
Start: ? — End: 2017-06-04

## 2017-06-04 MED ORDER — ACETAMINOPHEN 325 MG PO TABS
650.00 | ORAL_TABLET | ORAL | Status: DC
Start: ? — End: 2017-06-04

## 2017-06-04 MED ORDER — APIXABAN 5 MG PO TABS
5.00 | ORAL_TABLET | ORAL | Status: DC
Start: 2017-06-04 — End: 2017-06-04

## 2017-06-04 MED ORDER — FUROSEMIDE 20 MG PO TABS
20.00 | ORAL_TABLET | ORAL | Status: DC
Start: 2017-06-05 — End: 2017-06-04

## 2017-06-04 MED ORDER — INSULIN LISPRO 100 UNIT/ML ~~LOC~~ SOLN
3.00 | SUBCUTANEOUS | Status: DC
Start: 2017-06-05 — End: 2017-06-04

## 2017-06-04 MED ORDER — INSULIN LISPRO 100 UNIT/ML ~~LOC~~ SOLN
.00 | SUBCUTANEOUS | Status: DC
Start: 2017-06-04 — End: 2017-06-04

## 2017-06-04 MED ORDER — POTASSIUM CHLORIDE ER 20 MEQ TABLET,EXTENDED RELEASE(PART/CRYST)
ORAL_TABLET | Freq: Every day | ORAL | 3 refills | 0 days | Status: CP
Start: 2017-06-04 — End: 2017-08-01

## 2017-06-06 DIAGNOSIS — R160 Hepatomegaly, not elsewhere classified: Secondary | ICD-10-CM

## 2017-06-06 DIAGNOSIS — R918 Other nonspecific abnormal finding of lung field: Secondary | ICD-10-CM

## 2017-06-06 DIAGNOSIS — I10 Essential (primary) hypertension: Secondary | ICD-10-CM

## 2017-06-06 DIAGNOSIS — Z794 Long term (current) use of insulin: Secondary | ICD-10-CM

## 2017-06-06 DIAGNOSIS — I2699 Other pulmonary embolism without acute cor pulmonale: Principal | ICD-10-CM

## 2017-06-06 DIAGNOSIS — E440 Moderate protein-calorie malnutrition: Secondary | ICD-10-CM

## 2017-06-06 DIAGNOSIS — E119 Type 2 diabetes mellitus without complications: Secondary | ICD-10-CM

## 2017-06-06 DIAGNOSIS — Z7901 Long term (current) use of anticoagulants: Secondary | ICD-10-CM

## 2017-06-06 DIAGNOSIS — I4891 Unspecified atrial fibrillation: Secondary | ICD-10-CM

## 2017-06-07 ENCOUNTER — Ambulatory Visit: Admit: 2017-06-07 | Discharge: 2017-06-08 | Payer: MEDICARE

## 2017-06-07 DIAGNOSIS — C3490 Malignant neoplasm of unspecified part of unspecified bronchus or lung: Principal | ICD-10-CM

## 2017-06-07 DIAGNOSIS — C7931 Secondary malignant neoplasm of brain: Secondary | ICD-10-CM | POA: Diagnosis not present

## 2017-06-07 DIAGNOSIS — C7951 Secondary malignant neoplasm of bone: Secondary | ICD-10-CM | POA: Diagnosis not present

## 2017-06-07 DIAGNOSIS — R9402 Abnormal brain scan: Secondary | ICD-10-CM | POA: Diagnosis not present

## 2017-06-07 DIAGNOSIS — G939 Disorder of brain, unspecified: Secondary | ICD-10-CM | POA: Diagnosis not present

## 2017-06-07 DIAGNOSIS — I639 Cerebral infarction, unspecified: Secondary | ICD-10-CM | POA: Diagnosis not present

## 2017-06-08 DIAGNOSIS — R918 Other nonspecific abnormal finding of lung field: Secondary | ICD-10-CM

## 2017-06-08 DIAGNOSIS — E119 Type 2 diabetes mellitus without complications: Secondary | ICD-10-CM

## 2017-06-08 DIAGNOSIS — E440 Moderate protein-calorie malnutrition: Secondary | ICD-10-CM

## 2017-06-08 DIAGNOSIS — I10 Essential (primary) hypertension: Secondary | ICD-10-CM

## 2017-06-08 DIAGNOSIS — I2699 Other pulmonary embolism without acute cor pulmonale: Principal | ICD-10-CM

## 2017-06-08 DIAGNOSIS — Z7901 Long term (current) use of anticoagulants: Secondary | ICD-10-CM

## 2017-06-08 DIAGNOSIS — Z794 Long term (current) use of insulin: Secondary | ICD-10-CM

## 2017-06-08 DIAGNOSIS — R160 Hepatomegaly, not elsewhere classified: Secondary | ICD-10-CM

## 2017-06-08 DIAGNOSIS — I4891 Unspecified atrial fibrillation: Secondary | ICD-10-CM

## 2017-06-09 DIAGNOSIS — C3490 Malignant neoplasm of unspecified part of unspecified bronchus or lung: Secondary | ICD-10-CM | POA: Diagnosis not present

## 2017-06-12 ENCOUNTER — Ambulatory Visit: Admit: 2017-06-12 | Discharge: 2017-06-12 | Payer: MEDICARE

## 2017-06-12 DIAGNOSIS — I4891 Unspecified atrial fibrillation: Secondary | ICD-10-CM

## 2017-06-12 DIAGNOSIS — R160 Hepatomegaly, not elsewhere classified: Secondary | ICD-10-CM

## 2017-06-12 DIAGNOSIS — E119 Type 2 diabetes mellitus without complications: Secondary | ICD-10-CM

## 2017-06-12 DIAGNOSIS — E440 Moderate protein-calorie malnutrition: Secondary | ICD-10-CM

## 2017-06-12 DIAGNOSIS — Z794 Long term (current) use of insulin: Secondary | ICD-10-CM

## 2017-06-12 DIAGNOSIS — R918 Other nonspecific abnormal finding of lung field: Secondary | ICD-10-CM

## 2017-06-12 DIAGNOSIS — I2699 Other pulmonary embolism without acute cor pulmonale: Principal | ICD-10-CM

## 2017-06-12 DIAGNOSIS — I10 Essential (primary) hypertension: Secondary | ICD-10-CM

## 2017-06-12 DIAGNOSIS — Z7901 Long term (current) use of anticoagulants: Secondary | ICD-10-CM

## 2017-06-13 DIAGNOSIS — E119 Type 2 diabetes mellitus without complications: Secondary | ICD-10-CM

## 2017-06-13 DIAGNOSIS — R918 Other nonspecific abnormal finding of lung field: Secondary | ICD-10-CM

## 2017-06-13 DIAGNOSIS — Z794 Long term (current) use of insulin: Secondary | ICD-10-CM

## 2017-06-13 DIAGNOSIS — R160 Hepatomegaly, not elsewhere classified: Secondary | ICD-10-CM

## 2017-06-13 DIAGNOSIS — I10 Essential (primary) hypertension: Secondary | ICD-10-CM

## 2017-06-13 DIAGNOSIS — I4891 Unspecified atrial fibrillation: Secondary | ICD-10-CM

## 2017-06-13 DIAGNOSIS — E440 Moderate protein-calorie malnutrition: Secondary | ICD-10-CM

## 2017-06-13 DIAGNOSIS — Z7901 Long term (current) use of anticoagulants: Secondary | ICD-10-CM

## 2017-06-13 DIAGNOSIS — I2699 Other pulmonary embolism without acute cor pulmonale: Principal | ICD-10-CM

## 2017-06-13 MED ORDER — CRIZOTINIB 250 MG CAPSULE
Freq: Two times a day (BID) | ORAL | 3 refills | 0.00000 days | Status: CP
Start: 2017-06-13 — End: 2017-06-13

## 2017-06-13 MED ORDER — CRIZOTINIB 250 MG CAPSULE: 250 mg | each | 3 refills | 0 days

## 2017-06-13 MED ORDER — CRIZOTINIB 250 MG CAPSULE: 250 mg | capsule | Freq: Two times a day (BID) | 3 refills | 0 days | Status: AC

## 2017-06-14 ENCOUNTER — Ambulatory Visit: Admit: 2017-06-14 | Discharge: 2017-06-15 | Payer: MEDICARE | Attending: Family Medicine | Primary: Family Medicine

## 2017-06-14 DIAGNOSIS — I10 Essential (primary) hypertension: Principal | ICD-10-CM

## 2017-06-14 DIAGNOSIS — R918 Other nonspecific abnormal finding of lung field: Secondary | ICD-10-CM

## 2017-06-14 DIAGNOSIS — Z794 Long term (current) use of insulin: Secondary | ICD-10-CM

## 2017-06-14 DIAGNOSIS — R160 Hepatomegaly, not elsewhere classified: Secondary | ICD-10-CM

## 2017-06-14 DIAGNOSIS — I4891 Unspecified atrial fibrillation: Secondary | ICD-10-CM

## 2017-06-14 DIAGNOSIS — E119 Type 2 diabetes mellitus without complications: Secondary | ICD-10-CM

## 2017-06-14 DIAGNOSIS — D649 Anemia, unspecified: Secondary | ICD-10-CM

## 2017-06-14 DIAGNOSIS — E440 Moderate protein-calorie malnutrition: Secondary | ICD-10-CM

## 2017-06-14 DIAGNOSIS — C3490 Malignant neoplasm of unspecified part of unspecified bronchus or lung: Secondary | ICD-10-CM

## 2017-06-14 DIAGNOSIS — I2699 Other pulmonary embolism without acute cor pulmonale: Principal | ICD-10-CM

## 2017-06-14 DIAGNOSIS — Z23 Encounter for immunization: Secondary | ICD-10-CM

## 2017-06-14 DIAGNOSIS — Z7901 Long term (current) use of anticoagulants: Secondary | ICD-10-CM

## 2017-06-14 LAB — COMPREHENSIVE METABOLIC PANEL
ALKALINE PHOSPHATASE: 105 U/L (ref 38–126)
ALT (SGPT): <8 U/L — ABNORMAL LOW (ref 15–48)
ANION GAP: 11 mmol/L (ref 9–15)
AST (SGOT): 15 U/L (ref 14–38)
BILIRUBIN TOTAL: 0.8 mg/dL (ref 0.0–1.2)
BLOOD UREA NITROGEN: 20 mg/dL (ref 7–21)
BUN / CREAT RATIO: 30
CHLORIDE: 99 mmol/L (ref 98–107)
CO2: 30 mmol/L (ref 22.0–30.0)
CREATININE: 0.66 mg/dL (ref 0.60–1.00)
EGFR MDRD AF AMER: >=60 mL/min/{1.73_m2} (ref >=60)
EGFR MDRD NON AF AMER: >=60 mL/min/{1.73_m2} (ref >=60)
GLUCOSE RANDOM: 187 mg/dL — ABNORMAL HIGH (ref 65–179)
POTASSIUM: 3.6 mmol/L (ref 3.5–5.0)
PROTEIN TOTAL: 6.8 g/dL (ref 6.5–8.3)
SODIUM: 140 mmol/L (ref 135–145)

## 2017-06-14 LAB — CBC W/ AUTO DIFF
BASOPHILS ABSOLUTE COUNT: 0 10*9/L (ref 0.0–0.1)
BASOPHILS RELATIVE PERCENT: 0.4 %
EOSINOPHILS ABSOLUTE COUNT: 0.1 10*9/L (ref 0.0–0.4)
EOSINOPHILS RELATIVE PERCENT: 0.8 %
HEMATOCRIT: 29.9 % — ABNORMAL LOW (ref 36.0–46.0)
HEMOGLOBIN: 11.1 g/dL — ABNORMAL LOW (ref 12.0–16.0)
LARGE UNSTAINED CELLS: 1 % (ref 0–4)
LYMPHOCYTES RELATIVE PERCENT: 14.1 %
MEAN CORPUSCULAR HEMOGLOBIN: 23.7 pg — ABNORMAL LOW (ref 26.0–34.0)
MEAN CORPUSCULAR VOLUME: 63.7 fL — ABNORMAL LOW (ref 80.0–100.0)
MEAN PLATELET VOLUME: 7.4 fL (ref 7.0–10.0)
MONOCYTES ABSOLUTE COUNT: 0.4 10*9/L (ref 0.2–0.8)
MONOCYTES RELATIVE PERCENT: 4.7 %
NEUTROPHILS ABSOLUTE COUNT: 6.1 10*9/L (ref 2.0–7.5)
NEUTROPHILS RELATIVE PERCENT: 78.8 %
PLATELET COUNT: 315 10*9/L (ref 150–440)
RED BLOOD CELL COUNT: 4.69 10*12/L (ref 4.00–5.20)
RED CELL DISTRIBUTION WIDTH: 19.9 % — ABNORMAL HIGH (ref 12.0–15.0)
WBC ADJUSTED: 7.8 10*9/L (ref 4.5–11.0)

## 2017-06-14 LAB — CALCIUM: Calcium:MCnc:Pt:Ser/Plas:Qn:: 9.9

## 2017-06-14 LAB — LARGE UNSTAINED CELLS: Lab: 1

## 2017-06-14 LAB — IRON PANEL
IRON SATURATION (CALC): 22 % (ref 15–50)
TOTAL IRON BINDING CAPACITY (CALC): 175.8 mg/dL — ABNORMAL LOW (ref 252.0–479.0)
TRANSFERRIN: 139.5 mg/dL — ABNORMAL LOW (ref 200.0–380.0)

## 2017-06-14 LAB — TARGET CELLS

## 2017-06-14 LAB — FERRITIN: Ferritin:MCnc:Pt:Ser/Plas:Qn:: 357 — ABNORMAL HIGH

## 2017-06-14 LAB — TRANSFERRIN: Transferrin:MCnc:Pt:Ser/Plas:Qn:: 139.5 — ABNORMAL LOW

## 2017-06-14 NOTE — Unmapped (Signed)
Per test claim for [facility] at the [facility], patient needs Medication Assistance Program for Prior Authorization.

## 2017-06-14 NOTE — Unmapped (Signed)
Blood draw completed in 1 stick(s). Pt tolerated well with no observed signs of difficulty

## 2017-06-14 NOTE — Unmapped (Signed)
Controlled.   Continue amlodipine 10mg , lasix 20mg , losartan 100mg  and potassium supplement.   Labs: CMP  BP Readings from Last 3 Encounters:   06/14/17 113/71   06/06/17 120/60   06/04/17 123/70

## 2017-06-14 NOTE — Unmapped (Signed)
Emboli in right upper lobe segmental and subsegmental arteries found 04/30/17.   Continue eliquis 5mg  BID - instructed to stop 2 days prior to scheduled pleural catheter insertion.

## 2017-06-14 NOTE — Unmapped (Signed)
Suspected based on 04/30/17 CT, confirmed with biopsy on 3/21.   With metastasis to liver.   Oncology follow up scheduled 4/23.

## 2017-06-14 NOTE — Unmapped (Addendum)
Borderline controlled.   Continue novolog 70/30 at reduced dose: 5U QAM, 5U QPM. She is inconsistently taking insulin d/t hypoglycemia.  Continue checking blood sugars qachs.   Consider switch to oral medication in future.   RTC in 71mo  Lab Results   Component Value Date    A1C 8.1 (H) 05/19/2017

## 2017-06-14 NOTE — Unmapped (Signed)
Advised to get at pharmacy.

## 2017-06-14 NOTE — Unmapped (Signed)
Hx of anemia, per son. Not on iron supplement.   Labs: CBC, iron panel, ferritin.

## 2017-06-14 NOTE — Unmapped (Signed)
Assessment and Plan  [name] [name] is a 84 y.o. female with a PMH as above who presents to the clinic for the following:    Problem List Items Addressed This Visit     Need for shingles vaccine     Advised to get at pharmacy.         Metastatic lung cancer (metastasis from lung to other site), unspecified laterality (CMS-HCC)     Suspected based on 04/30/17 CT, confirmed with biopsy on 3/21.   With metastasis to liver.   Oncology follow up scheduled 4/23.            HTN (hypertension) - Primary     Controlled.   Continue amlodipine 10mg , lasix 20mg , losartan 100mg  and potassium supplement.   Labs: CMP  BP Readings from Last 3 Encounters:   06/14/17 113/71   06/06/17 120/60   06/04/17 123/70            Relevant Orders    Comprehensive Metabolic Panel (Completed)    Diabetes mellitus, type 2 (CMS-HCC)     Borderline controlled.   Continue novolog 70/30 at reduced dose: 5U QAM, 5U QPM. She is inconsistently taking insulin d/t hypoglycemia.  Continue checking blood sugars qachs.   Consider switch to oral medication in future.   RTC in 80mo  Lab Results   Component Value Date    A1C 8.1 (H) 05/19/2017            Anemia     Hx of anemia, per son. Not on iron supplement.   Labs: CBC, iron panel, ferritin.              Relevant Orders    CBC w/ Differential (Completed)    Iron Panel (Completed)    Ferritin (Completed)    CBC w/ Differential (Completed)    Morphology Review (Completed)    Acute pulmonary embolism (CMS-HCC)     Emboli in right upper lobe segmental and subsegmental arteries found 04/30/17.   Continue eliquis 5mg  BID - instructed to stop 2 days prior to scheduled pleural catheter insertion.               Return in about 1 month (around 07/12/2017) for DM2, labs.    [name]  06/25/2017    Chief Complaint  Chief Complaint   Patient presents with   ??? Establish Care       HPI  [name] [name] is a 84 y.o. female with a PMH as below who presents for the following:    Acute pulmonary embolism - emboli in right upper lobe segmental and subsegmental arteries 04/30/17, started on apixaban.     HTN - Currently taking amlodipine 5mg , losartan 100mg , lasix 20mg  and potassium supplement.     Metastatic lung cancer - left upper lung 10cm mass found 04/30/17 with 2.7cm right upper lobe nodule. With mediastinal lymphadenopathy and hepatic lobe mass. Liver lesion biopsied 3/21 confirmed adenocarcinoma of lung.   - upcoming f/u for pleural effusion. Stop eliquis as recommend.  - No cardiopulm complaints - admission sxs of CP/DOE have resolved    DM2 - Pt has been on insulin for 1-2 years. She has been having low blood sugars to the 40s, so son has been adjusting dose, giving 5-10U QAM and 4-5U QPM. Typically blood sugars are 130-220.   Lab Results   Component Value Date    A1C 8.1 (H) 05/19/2017     Anemia - Son reports pt  has hx of anemia; no iron studies on file.     Past Medical History:   Diagnosis Date   ??? Atrial fibrillation (CMS-HCC)    ??? Diabetes mellitus (CMS-HCC)    ??? Hypertension      Current Outpatient Medications on File Prior to Visit   Medication Sig Dispense Refill   ??? amLODIPine (NORVASC) 10 MG tablet Take 1 tablet by mouth.     ??? apixaban (ELIQUIS) 5 mg Tab 2 tabs (10 mg) twice a day for 7 days, then 1 tab (5 mg) twice a day (Patient taking differently: 5 mg Two (2) times a day. 2 tabs (10 mg) twice a day for 7 days, then 1 tab (5 mg) twice a day) 60 tablet 1   ??? blood sugar diagnostic (ACCU-CHEK AVIVA) Strp      ??? crizotinib (XALKORI) 250 mg capsule Take 1 capsule (250 mg total) by mouth Two (2) times a day. 60 capsule 3   ??? flash glucose scanning reader 1 each.     ??? flash glucose sensor kit 3 each.     ??? losartan (COZAAR) 100 MG tablet Take 1 tablet by mouth.     ??? pen needle, diabetic (BD ULTRA-FINE SHORT PEN NEEDLE) 31 gauge x 5/16 Ndle USE AS DIRECTED. USE 4 NEEDLES A DAY WITH DIABETIC MEDICATION PENS AS DIRECTED     ??? potassium chloride SA (K-DUR,KLOR-CON) 20 MEQ tablet Take 1 tablet (20 mEq total) by mouth daily. 30 tablet 3   ??? insulin aspart protamine-insulin aspart (NOVOLOG MIX 70-30FLEXPEN U-100) 100 unit/mL (70-30) injection pen TAKE 10 UNITS 30 MINUTES BEFORE BREAKFAST AND 8 UNITS 30 MINUTES BEFORE SUPPER.       No current facility-administered medications on file prior to visit.      Social History     Socioeconomic History   ??? Marital status: Widowed     Spouse name: Not on file   ??? Number of children: Not on file   ??? Years of education: Not on file   ??? Highest education level: Not on file   Occupational History   ??? Not on file   Social Needs   ??? Financial resource strain: Not on file   ??? Food insecurity:     Worry: Not on file     Inability: Not on file   ??? Transportation needs:     Medical: Not on file     Non-medical: Not on file   Tobacco Use   ??? Smoking status: Never Smoker   ??? Smokeless tobacco: Never Used   Substance and Sexual Activity   ??? Alcohol use: No   ??? Drug use: Never   ??? Sexual activity: Not on file   Lifestyle   ??? Physical activity:     Days per week: Not on file     Minutes per session: Not on file   ??? Stress: Not on file   Relationships   ??? Social connections:     Talks on phone: Not on file     Gets together: Not on file     Attends religious service: Not on file     Active member of club or organization: Not on file     Attends meetings of clubs or organizations: Not on file     Relationship status: Not on file   Other Topics Concern   ??? Do you use sunscreen? No   ??? Tanning bed use? No   ??? Are you easily burned? No   ???  Excessive sun exposure? No   ??? Blistering sunburns? No   Social History Narrative   ??? Not on file       The following portions of the patient's history were reviewed and updated as appropriate: allergies, current medications, past family history, past medical history, past social history, past surgical history and problem list.    Review of Systems   Comprehensive ROS negative except above    PHYSICAL EXAM  BP 113/71  - Pulse 99  - Temp 36.6 ??C (97.8 ??F) (Oral)  - Resp 16  - Ht 162.6 cm (5' 4.02)  - Wt 53.1 kg (117 lb 1.3 oz)  - BMI 20.09 kg/m??   General appearance: alert, cooperative, NAD   Head: normocephalic and atraumatic  Eyes: normal EOMs, PERRL, normal conjunctiva, no discharge  ENT: OP clear and moist, no tonsillar exudates or erythema, normal TMs and external ear canals, normal nasal passages  Neck: normal ROM, supple, no thyromegaly, no cervical/supraclavicular lymphadenopathy, no appreciable JVD  Lungs: normal work of breathing, good air entry and upper lobes and clear to auscultation bilaterally, decreased bibasilar breath sounds. no wheezes or crackles appreciated   Heart: regular rate and rhythm, S1, S2 normal, no murmur, click, rub or gallop   Abdomen: Abdomen soft, non-tender, non distended. No masses, no organomegaly. Bowel sounds present.  Extremities: extremities normal, atraumatic, no cyanosis or edema  NEURO: AAOx3, appropriately responds to questions, spontaneous movement of extremities  Psych: normal mood and affect    [facility] Components:     Barriers to goals identified and addressed. Pertinent handouts were given today and reviewed with the patient as indicated.  The Care Plan and Self-Management goals have been included on the AVS and the AVS has been printed. Any outside resources or referrals needed at this time are noted above. Patient's current medications have been reviewed. Any new medications prescribed have been discussed, and side effects have been addressed. Have assessed the patient's understanding, response, and barriers to adherence to medications. Patient voiced understanding and all questions have been answered to satisfaction.     I attest that I, Gwenlyn Fudge, personally documented this note while acting as scribe for Dr. Jarvis Newcomer B. Allena Katz, MD.   Gwenlyn Fudge, Scribe  06/25/2017 11:30 AM

## 2017-06-16 DIAGNOSIS — Z794 Long term (current) use of insulin: Secondary | ICD-10-CM

## 2017-06-16 DIAGNOSIS — Z7901 Long term (current) use of anticoagulants: Secondary | ICD-10-CM

## 2017-06-16 DIAGNOSIS — R918 Other nonspecific abnormal finding of lung field: Secondary | ICD-10-CM

## 2017-06-16 DIAGNOSIS — R160 Hepatomegaly, not elsewhere classified: Secondary | ICD-10-CM

## 2017-06-16 DIAGNOSIS — I10 Essential (primary) hypertension: Secondary | ICD-10-CM

## 2017-06-16 DIAGNOSIS — I2699 Other pulmonary embolism without acute cor pulmonale: Principal | ICD-10-CM

## 2017-06-16 DIAGNOSIS — E440 Moderate protein-calorie malnutrition: Secondary | ICD-10-CM

## 2017-06-16 DIAGNOSIS — I4891 Unspecified atrial fibrillation: Secondary | ICD-10-CM

## 2017-06-16 DIAGNOSIS — E119 Type 2 diabetes mellitus without complications: Secondary | ICD-10-CM

## 2017-06-19 ENCOUNTER — Ambulatory Visit: Admit: 2017-06-19 | Discharge: 2017-06-19 | Payer: MEDICARE

## 2017-06-19 DIAGNOSIS — J910 Malignant pleural effusion: Principal | ICD-10-CM

## 2017-06-19 DIAGNOSIS — C3490 Malignant neoplasm of unspecified part of unspecified bronchus or lung: Secondary | ICD-10-CM | POA: Diagnosis not present

## 2017-06-19 DIAGNOSIS — J90 Pleural effusion, not elsewhere classified: Secondary | ICD-10-CM | POA: Diagnosis not present

## 2017-06-19 DIAGNOSIS — J939 Pneumothorax, unspecified: Secondary | ICD-10-CM | POA: Diagnosis not present

## 2017-06-19 DIAGNOSIS — E119 Type 2 diabetes mellitus without complications: Secondary | ICD-10-CM | POA: Diagnosis not present

## 2017-06-19 DIAGNOSIS — Z9049 Acquired absence of other specified parts of digestive tract: Secondary | ICD-10-CM | POA: Diagnosis not present

## 2017-06-19 DIAGNOSIS — R918 Other nonspecific abnormal finding of lung field: Secondary | ICD-10-CM | POA: Diagnosis not present

## 2017-06-19 DIAGNOSIS — I4891 Unspecified atrial fibrillation: Secondary | ICD-10-CM | POA: Diagnosis not present

## 2017-06-19 DIAGNOSIS — Z602 Problems related to living alone: Secondary | ICD-10-CM | POA: Diagnosis not present

## 2017-06-19 DIAGNOSIS — C7800 Secondary malignant neoplasm of unspecified lung: Secondary | ICD-10-CM | POA: Diagnosis not present

## 2017-06-19 DIAGNOSIS — I10 Essential (primary) hypertension: Secondary | ICD-10-CM | POA: Diagnosis not present

## 2017-06-19 DIAGNOSIS — Z86711 Personal history of pulmonary embolism: Secondary | ICD-10-CM | POA: Diagnosis not present

## 2017-06-19 DIAGNOSIS — R600 Localized edema: Secondary | ICD-10-CM | POA: Diagnosis not present

## 2017-06-19 DIAGNOSIS — Z7901 Long term (current) use of anticoagulants: Secondary | ICD-10-CM | POA: Diagnosis not present

## 2017-06-19 NOTE — Unmapped (Signed)
[facility] Medication Referral: PA APPROVED    Medication (Brand/Generic): Xalkori 250mg  capsules    Initial FSI Test Claim completed with resulted information below:  No PA required  Patient ABLE to fill at [facility]:  [id number]  Anticipated Copay: $8.50  Is anticipated copay with a copay card or grant? No    As Co-pay is under $100 defined limit, per policy there will be no further investigation of need for financial assistance at this time unless patient requests. This referral has been communicated to the provider and handed off to the [facility] team for further processing and filling of prescribed medication.   ______________________________________________________________________  Please utilize this referral for viewing purposes as it will serve as the central location for all relevant documentation and updates.

## 2017-06-19 NOTE — Unmapped (Signed)
Michae Kava called regarding [name] [name]. They want to know when home health will be arranged for patient.    Please call [phone].    Thanks in advance,  Kelli Hope  [facility]  [phone]

## 2017-06-19 NOTE — Unmapped (Addendum)
INDICATIONS: Paramalignant L Pleural Effusion    MONITORING:  The patient was monitored continuously by heart rate, blood pressure, pulse oximetry, and EKG tracing.      CONSENT AND TIME OUT: After the risks benefits and alternatives of the procedure were thoroughly explained, informed consent was obtained including the risks of chest pain, cough, bleeding, infection, injury to the lung or orther organ and a pneumothorax requiring chest tube placement. Immediately prior to the procedure, the time out was executed including correct patient identification and agreement on the procedure to be performed.    PROCEDURE DETAILS: The left sided pleural effusion was demostrated by ultrasound, after positioning the patient in lateral decubitus position, the effusion was again demonstrated and an entry site was marked.  The patient was then prepped and draped in sterile fashion and 1% lidocaine was introduced subcutaneously then deep to fully anesthetize the pleura.  Fluid was encountered indicating proper site marking.  1% lidocaine was then instilled subcutaneously at the skin entry site then along a tunnel.  Once fully anesthetized, the seldinger needle was placed into the pleural space with confirmatory aspiration of fluid.  The needle was removed and a wire was passed into the space via a soft plastic catheter.  The IPC was then tunneled via an anterior incision to the site of wire placement.  The guide catheter was removed and the introducer/dilator was inserted.  The IPC was then inserted into the introducer sheath without difficulty.  The sheath was removed and the IPC was accessed and drained.  Once drainage was confirmed, the two incision sites were sutured, with a silk anchor suture anteriorly and a polysorb suture posteriorly.  The site was then prepped and dressed in sterile fashion.     FINDINGS: 700 mL of serous pleural fluid was removed.     ESTIMATED BLOOD LOSS:  Trace    COMPLICATIONS: No immediate post-procedure complications.    PLAN: A follow up chest x-ray is pending. Catheter drainage should be titrated to patient comfort in terms of both volume and frequency. We will arrange for clinic follow-up in 2 weeks.     ATTENDING ATTESTATION:    I was present for the entirety of the procedure(s). Azim Gillingham, MD. Moderate conscious sedation was administered at my direction by Glena Norfolk, RT who independently monitored the patients level of consciousness and vital signs.??Total procedure time was 20 min.

## 2017-06-19 NOTE — Unmapped (Signed)
See separate procedure note for procedure details.

## 2017-06-19 NOTE — Unmapped (Signed)
INDICATIONS: Symptomatic malignant right pleural effusion.    MONITORING:  The patient was monitored continuously by heart rate, blood pressure, pulse oximetry, and EKG tracing.      CONSENT AND TIME OUT: After the risks benefits and alternatives of the procedure were thoroughly explained, informed consent was obtained including the risks of chest pain, cough, bleeding, infection, injury to the lung or orther organ and a pneumothorax requiring chest tube placement. Immediately prior to the procedure, the time out was executed including correct patient identification and agreement on the procedure to be performed.    PROCEDURE DETAILS: The right sided pleural effusion was demostrated by ultrasound, after positioning the patient in lateral decubitus position, the effusion was again demonstrated and an entry site was marked.  The patient was then prepped and draped in sterile fashion and 1% lidocaine was introduced subcutaneously then deep to fully anesthetize the pleura.  Fluid was encountered indicating proper site marking.  1% lidocaine was then instilled subcutaneously at the skin entry site then along a tunnel.  Once fully anesthetized, the seldinger needle was placed into the pleural space with confirmatory aspiration of fluid.  The needle was removed and a wire was passed into the space via a soft plastic catheter.  The IPC was then tunneled via an anterior incision to the site of wire placement.  The guide catheter was removed and the introducer/dilator was inserted.  The IPC was then inserted into the introducer sheath without difficulty.  The sheath was removed and the IPC was accessed and drained.  Once drainage was confirmed, the two incision sites were sutured, with a silk anchor suture anteriorly and a polysorb suture posteriorly.  The site was then prepped and dressed in sterile fashion.     FINDINGS: 1,000 mL of serous pleural fluid was removed.     ESTIMATED BLOOD LOSS:  Minimal    COMPLICATIONS: No immediate post-procedure complications.     PLAN: A follow up chest x-ray is pending. Catheter drainage should be titrated to patient comfort in terms of both volume and frequency. We will arrange for clinic follow-up in 2 weeks.     ATTENDING ATTESTATION:    I was present for the entirety of the procedure(s). Barre Aydelott, MD. Moderate conscious sedation was administered at my direction by Glena Norfolk, RT who independently monitored the patients level of consciousness and vital signs. Total procedure time was 20 min.

## 2017-06-19 NOTE — Unmapped (Signed)
Pt's daughter, [name] [name], was returning your call reagrding home health changing a catheter. She can be reached at [phone].  Thanks in advance,  Deatra Ina  [facility]  [phone]

## 2017-06-19 NOTE — Unmapped (Signed)
Hi,    Please call patient regarding if they need to fast for lab work on tomorrow 06/20/17.      Thanks in advance,  Christell Faith  [facility]  [phone]

## 2017-06-20 ENCOUNTER — Other Ambulatory Visit: Admit: 2017-06-20 | Discharge: 2017-06-20 | Payer: MEDICARE

## 2017-06-20 ENCOUNTER — Encounter: Admit: 2017-06-20 | Discharge: 2017-07-12 | Payer: MEDICARE

## 2017-06-20 ENCOUNTER — Ambulatory Visit: Admit: 2017-06-20 | Discharge: 2017-06-20 | Payer: MEDICARE

## 2017-06-20 ENCOUNTER — Inpatient Hospital Stay: Admit: 2017-06-20 | Discharge: 2017-07-12 | Payer: MEDICARE

## 2017-06-20 ENCOUNTER — Ambulatory Visit
Admit: 2017-06-20 | Discharge: 2017-06-20 | Payer: MEDICARE | Attending: Hematology & Oncology | Primary: Hematology & Oncology

## 2017-06-20 DIAGNOSIS — Z79899 Other long term (current) drug therapy: Secondary | ICD-10-CM

## 2017-06-20 DIAGNOSIS — E119 Type 2 diabetes mellitus without complications: Secondary | ICD-10-CM

## 2017-06-20 DIAGNOSIS — C3490 Malignant neoplasm of unspecified part of unspecified bronchus or lung: Principal | ICD-10-CM

## 2017-06-20 DIAGNOSIS — Z4803 Encounter for change or removal of drains: Secondary | ICD-10-CM

## 2017-06-20 DIAGNOSIS — R918 Other nonspecific abnormal finding of lung field: Secondary | ICD-10-CM

## 2017-06-20 DIAGNOSIS — Z794 Long term (current) use of insulin: Secondary | ICD-10-CM

## 2017-06-20 DIAGNOSIS — R000 Tachycardia, unspecified: Principal | ICD-10-CM

## 2017-06-20 DIAGNOSIS — E440 Moderate protein-calorie malnutrition: Secondary | ICD-10-CM

## 2017-06-20 DIAGNOSIS — Z7901 Long term (current) use of anticoagulants: Secondary | ICD-10-CM

## 2017-06-20 DIAGNOSIS — R160 Hepatomegaly, not elsewhere classified: Secondary | ICD-10-CM

## 2017-06-20 DIAGNOSIS — I10 Essential (primary) hypertension: Secondary | ICD-10-CM

## 2017-06-20 DIAGNOSIS — I4891 Unspecified atrial fibrillation: Secondary | ICD-10-CM

## 2017-06-20 DIAGNOSIS — J910 Malignant pleural effusion: Secondary | ICD-10-CM

## 2017-06-20 DIAGNOSIS — I2699 Other pulmonary embolism without acute cor pulmonale: Secondary | ICD-10-CM

## 2017-06-20 DIAGNOSIS — D510 Vitamin B12 deficiency anemia due to intrinsic factor deficiency: Secondary | ICD-10-CM

## 2017-06-20 DIAGNOSIS — N6310 Unspecified lump in the right breast, unspecified quadrant: Secondary | ICD-10-CM

## 2017-06-20 DIAGNOSIS — C787 Secondary malignant neoplasm of liver and intrahepatic bile duct: Secondary | ICD-10-CM

## 2017-06-20 DIAGNOSIS — C7931 Secondary malignant neoplasm of brain: Secondary | ICD-10-CM

## 2017-06-20 DIAGNOSIS — C3492 Malignant neoplasm of unspecified part of left bronchus or lung: Principal | ICD-10-CM

## 2017-06-20 DIAGNOSIS — E46 Unspecified protein-calorie malnutrition: Secondary | ICD-10-CM

## 2017-06-20 DIAGNOSIS — I4510 Unspecified right bundle-branch block: Secondary | ICD-10-CM | POA: Diagnosis not present

## 2017-06-20 LAB — COMPREHENSIVE METABOLIC PANEL
ALBUMIN: 3 g/dL — ABNORMAL LOW (ref 3.5–5.0)
ALKALINE PHOSPHATASE: 90 U/L (ref 38–126)
ALT (SGPT): 20 U/L (ref 15–48)
ANION GAP: 6 mmol/L — ABNORMAL LOW (ref 9–15)
AST (SGOT): 12 U/L — ABNORMAL LOW (ref 14–38)
BILIRUBIN TOTAL: 0.9 mg/dL (ref 0.0–1.2)
BLOOD UREA NITROGEN: 14 mg/dL (ref 7–21)
BUN / CREAT RATIO: 23
CALCIUM: 9.3 mg/dL (ref 8.5–10.2)
CO2: 28 mmol/L (ref 22.0–30.0)
CREATININE: 0.61 mg/dL (ref 0.60–1.00)
EGFR MDRD AF AMER: >=60 mL/min/{1.73_m2} (ref >=60)
EGFR MDRD NON AF AMER: >=60 mL/min/{1.73_m2} (ref >=60)
GLUCOSE RANDOM: 113 mg/dL (ref 65–179)
PROTEIN TOTAL: 6.3 g/dL — ABNORMAL LOW (ref 6.5–8.3)
SODIUM: 136 mmol/L (ref 135–145)

## 2017-06-20 LAB — CBC W/ AUTO DIFF
BASOPHILS ABSOLUTE COUNT: 0 10*9/L (ref 0.0–0.1)
BASOPHILS RELATIVE PERCENT: 0.2 %
EOSINOPHILS ABSOLUTE COUNT: 0.1 10*9/L (ref 0.0–0.4)
EOSINOPHILS RELATIVE PERCENT: 1.4 %
HEMATOCRIT: 29.3 % — ABNORMAL LOW (ref 36.0–46.0)
HEMOGLOBIN: 10.8 g/dL — ABNORMAL LOW (ref 12.0–16.0)
LARGE UNSTAINED CELLS: 1 % (ref 0–4)
LYMPHOCYTES ABSOLUTE COUNT: 1 10*9/L — ABNORMAL LOW (ref 1.5–5.0)
LYMPHOCYTES RELATIVE PERCENT: 12 %
MEAN CORPUSCULAR HEMOGLOBIN CONC: 37.1 g/dL — ABNORMAL HIGH (ref 31.0–37.0)
MEAN CORPUSCULAR VOLUME: 61.8 fL — ABNORMAL LOW (ref 80.0–100.0)
MEAN PLATELET VOLUME: 7.3 fL (ref 7.0–10.0)
MONOCYTES ABSOLUTE COUNT: 0.4 10*9/L (ref 0.2–0.8)
NEUTROPHILS ABSOLUTE COUNT: 6.8 10*9/L (ref 2.0–7.5)
NEUTROPHILS RELATIVE PERCENT: 80.8 %
PLATELET COUNT: 300 10*9/L (ref 150–440)
RED BLOOD CELL COUNT: 4.73 10*12/L (ref 4.00–5.20)
RED CELL DISTRIBUTION WIDTH: 19.4 % — ABNORMAL HIGH (ref 12.0–15.0)
WBC ADJUSTED: 8.4 10*9/L (ref 4.5–11.0)

## 2017-06-20 LAB — SMEAR REVIEW

## 2017-06-20 LAB — ANISOCYTOSIS

## 2017-06-20 LAB — SLIDE REVIEW

## 2017-06-20 LAB — CREATININE: Creatinine:MCnc:Pt:Ser/Plas:Qn:: 0.61

## 2017-06-20 LAB — PATHOLOGIST SMEAR INTERPRETATION

## 2017-06-20 LAB — MAGNESIUM: Magnesium:MCnc:Pt:Ser/Plas:Qn:: 1.8

## 2017-06-20 NOTE — Unmapped (Signed)
Reason for visit: [name] [name] is seen at the request of Dr. Curt Bears  for a thoracic medical oncology opinion regarding new diagnosis of Stage IV NSCLC.      HPI:  [name] [name] is an 84 yo F, never smoker, who initially became symptomatic  with dyspnea and cough in late Feb 2019.   History as follows:  Oncology History    - 04/30/17: Presented to ED with dyspnea for over 2 weeks.  CTA with PEs, b/l pleural effusions, mediastinal soft tissue mass 9/5/7cm, invading LUL nd 8.2cm lesion inleft hepatic lobe  - 05/17/17: b/l thoracentesis with 2L removed each lung.  Cytology with no malignant cells but lymphocyte predominant exudative effusiions  - 05/18/17: Biopsy liver lesion c/w lung adenocarcinoma        She lives with her her 2 teenage grandsons and has attentive sons/daughter close by.  Her daughter-in-law has been sleeping overnight with her since diagnosis.  She dresses herself, showers, able to do ADLs but family does most of cooking, grocery shopping and cleaning.  Her son manages her medicines and finances. She is otherwise feeling reasonable well.  Has some fatigue. No falls. Does not use a walker or cane.  She is able to walk around [place] but after exertion like this, has some chest pressure and has to sit down to rest, with resolution usually by .  No stairs at home.  She has insulin dependent DM, HTN and Afib, controlled with meds.      Interval Hx  Doing well,  No new complaints. No f/c/n/v/d.  No dyspnea at rest or cough. Ambulates around the house and has been out multiple times to the mass and the stores with her son and family and well able to manage that.  No CP.  She had a PleurX catheter placed yesterday to drain pleural fluid.         ROS:  10 systems were reviewed and found to be within normal limits except as described in the HPI.    MEDICATIONS:  Current Outpatient Medications on File Prior to Visit   Medication Sig Dispense Refill   ??? amLODIPine (NORVASC) 10 MG tablet Take 1 tablet by mouth.     ??? apixaban (ELIQUIS) 5 mg Tab 2 tabs (10 mg) twice a day for 7 days, then 1 tab (5 mg) twice a day (Patient taking differently: 5 mg Two (2) times a day. 2 tabs (10 mg) twice a day for 7 days, then 1 tab (5 mg) twice a day) 60 tablet 1   ??? blood sugar diagnostic (ACCU-CHEK AVIVA) Strp      ??? crizotinib (XALKORI) 250 mg capsule Take 1 capsule (250 mg total) by mouth Two (2) times a day. 60 capsule 3   ??? flash glucose scanning reader 1 each.     ??? flash glucose sensor kit 3 each.     ??? furosemide (LASIX) 20 MG tablet Take 1 tablet (20 mg total) by mouth daily. 30 tablet 0   ??? insulin aspart protamine-insulin aspart (NOVOLOG MIX 70-30FLEXPEN U-100) 100 unit/mL (70-30) injection pen TAKE 10 UNITS 30 MINUTES BEFORE BREAKFAST AND 8 UNITS 30 MINUTES BEFORE SUPPER.     ??? losartan (COZAAR) 100 MG tablet Take 1 tablet by mouth.     ??? pen needle, diabetic (BD ULTRA-FINE SHORT PEN NEEDLE) 31 gauge x 5/16 Ndle USE AS DIRECTED. USE 4 NEEDLES A DAY WITH DIABETIC MEDICATION PENS AS DIRECTED     ??? potassium chloride SA (  K-DUR,KLOR-CON) 20 MEQ tablet Take 1 tablet (20 mEq total) by mouth daily. 30 tablet 3     Current Facility-Administered Medications on File Prior to Visit   Medication Dose Route Frequency Provider Last Rate Last Dose   ??? [COMPLETED] fentaNYL (PF) (SUBLIMAZE) injection 25 mcg  25 mcg Intravenous Once Sherwood Gambler, MD   25 mcg at 06/19/17 1001       ALLERGIES:   Allergies   Allergen Reactions   ??? Sulfasalazine Nausea Only       PMH:  Past Medical History:   Diagnosis Date   ??? Atrial fibrillation (CMS-HCC)    ??? Diabetes mellitus (CMS-HCC)    ??? Hypertension        SOCIAL HISTORY:  Lives with her 2 teenage grandsons. Her sons, daughter live nearby.    Never smoker  No alcohol    FAMILY HISTORY:  No hx of cancer in her parents.  Daughter has breast cancer.  Another daughter died of lung cancer and was a heavy smoker for many years.     PHYSICAL EXAMINATION:  BP 106/62  - Pulse 92  - Temp 36.3 ??C (97.4 ??F) (Oral)  - Resp 18  - Ht 162.6 cm (5' 4.02)  - Wt 53.3 kg (117 lb 9.6 oz)  - SpO2 98%  - BMI 20.18 kg/m??     ECOG 1  GENERAL: Thin, pleasant woman not in acute distress  MOUTH: Moist and clear without thrush  NECK: Supple  LYMPH: No cervical or supraclavicular lymphadenopathy  CHEST: CTAB  CV: Nl s1/s2  ABD: Soft, not tender, not distended, no palpable organomegaly  EXTREMITIES: Warm and well perfused x 4 without cyanosis, clubbing or edema  MUSKOLOSKELETAL: No spinal tenderness to palpation  NEURO: CN2-12 intact.  Speech and gait within normal limits  AFFECT: Varied and appropriate to situation  DERM: No lesions noted    LABS:  Results for orders placed or performed in visit on 06/20/17   Comprehensive Metabolic Panel   Result Value Ref Range    Sodium 136 135 - 145 mmol/L    Potassium 3.6 3.5 - 5.0 mmol/L    Chloride 102 98 - 107 mmol/L    CO2 28.0 22.0 - 30.0 mmol/L    BUN 14 7 - 21 mg/dL    Creatinine 1.61 0.96 - 1.00 mg/dL    BUN/Creatinine Ratio 23     EGFR MDRD Non Af Amer >=60 >=60 mL/min/1.38m2    EGFR MDRD Af Amer >=60 >=60 mL/min/1.72m2    Anion Gap 6 (L) 9 - 15 mmol/L    Glucose 113 65 - 179 mg/dL    Calcium 9.3 8.5 - 04.5 mg/dL    Albumin 3.0 (L) 3.5 - 5.0 g/dL    Total Protein 6.3 (L) 6.5 - 8.3 g/dL    Total Bilirubin 0.9 0.0 - 1.2 mg/dL    AST 12 (L) 14 - 38 U/L    ALT 20 15 - 48 U/L    Alkaline Phosphatase 90 38 - 126 U/L   Pathologist Smear Review   Result Value Ref Range    Pathologist Smear Interpretation Confirmed by Hemepath Specialist/Senior Tech    Magnesium Level   Result Value Ref Range    Magnesium 1.8 1.6 - 2.2 mg/dL   CBC w/ Differential   Result Value Ref Range    WBC 8.4 4.5 - 11.0 10*9/L    RBC 4.73 4.00 - 5.20 10*12/L    HGB 10.8 (L) 12.0 - 16.0 g/dL  HCT 29.3 (L) 36.0 - 46.0 %    MCV 61.8 (L) 80.0 - 100.0 fL    MCH 22.9 (L) 26.0 - 34.0 pg    MCHC 37.1 (H) 31.0 - 37.0 g/dL    RDW 16.1 (H) 09.6 - 15.0 %    MPV 7.3 7.0 - 10.0 fL    Platelet 300 150 - 440 10*9/L    Neutrophils % 80.8 %    Lymphocytes % 12.0 %    Monocytes % 4.5 %    Eosinophils % 1.4 %    Basophils % 0.2 %    Absolute Neutrophils 6.8 2.0 - 7.5 10*9/L    Absolute Lymphocytes 1.0 (L) 1.5 - 5.0 10*9/L    Absolute Monocytes 0.4 0.2 - 0.8 10*9/L    Absolute Eosinophils 0.1 0.0 - 0.4 10*9/L    Absolute Basophils 0.0 0.0 - 0.1 10*9/L    Large Unstained Cells 1 0 - 4 %    Microcytosis Marked (A) Not Present    Anisocytosis Moderate (A) Not Present    Hyperchromasia Marked (A) Not Present   Morphology Review   Result Value Ref Range    Smear Review Comments See Comment (A) Undefined    Target Cells Moderate (A) Not Present    Poikilocytosis Moderate (A) Not Present       RADIOLOGY:  MRI Brain 05/30/17  --Multiple small subacute acute/subacute right cerebral infarcts.  --Numerous small enhancing lesions in the right cerebral hemisphere, some of which are concerning for metastases, while some are more consistent with subacute infarct.  --Left frontal calvarial metastasis with epidural/dural extension.  --Small dural based lesion at the left paramedian vertex, meningioma or venous structure favored over metastasis.    CT-C 05/11/17  - Left upper lobe 10 cm mass and right upper lobe 2.7 cm nodular opacity; primary consideration is for malignancy, including primary lung cancer for one or both of these lesions.  - Mediastinal lymphadenopathy, left supraclavicular lymphadenopathy, and large left hepatic lobe mass; primary consideration is for metastatic disease.  - Moderate/large bilateral pleural effusions.  - Small pericardial effusion.    CTA 04/30/17  - Large soft tissue attenuation left paramediastinal/left upper lobe mass with obstruction/invasion of the left upper lobe bronchi concerning for malignancy versus metastatic disease.  - Pulmonary emboli in the right upper lobe segmental and subsegmental pulmonary arteries.  - Irregular consolidative opacity in the right upper lobe with associated segmental pulmonary embolus concerning for focus of infarct. Infection or malignancy additional considerations.  - Large bilateral pleural effusions.  - Large hypoattenuating lesion in the left hepatic lobe incompletely characterized on current study, concerning for mass lesion. Consider CT abdomen pelvis for further characterization.  - Filling defect in the intrahepatic IVC concerning for tumor invasion versus thrombus.      PATHOLOGY:  Final Diagnosis 05/19/17   A: Liver, lesion, needle core biopsy   - Metastatic poorly differentiated carcinoma, consistent with adenocarcinoma of lung origin (see ancillary studies)     NOTE: PDL-1 IHC on liver biopsy still pending    STRATA NGS Liver 05/31/17  MET exon 14  RB1 p.W681  TP53 p.L194F  MSS  TMB ??? Low; Mutations per MB: 7  PD-L1 ??? High; RNA expression score: 100        ASSESSMENT AND PLAN:  77 yoF with Stage IV NSCLC-adenocarcinoma, mets to liver. She is a never smoker.    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  We are still awaiting results of IHC PDL-1 but we reviewed that even if high expression, there is no clear data regarding sequence order of a METi vs IO monotherapy. She is adamant that she is not willing to consider chemotherapy or chemoIO combination as she wants to maintain her QoL preferentially. We discussed side effects of crizotinib including fatigue, diarrhea, bradycardia/prolonged QTc, vision changes, elevated LFTs, She consented to treatment with crizotinib.  Additional counseling was given by PharmD.     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  Baseline QTc today 495.  Will have her return in 1 week for a tox check and repeat ECG to monitor.    Brain Metastases: Discussed with RadOnc and the larger lesion is most c/w a meningioma and does not need intervention.  The small punctate ones are too small to treat currently and will monitor.  Given that crizotinib has lower brain penetration, will get repeat MRI brain a bit earlier at 2mos    Microcytic Anemia concerning for Thallassemia on blood smear today.  Sent Hg electrophoresis.    Supportive Care: Home Health organized and will assist with management of pleurx catheter.     Nutrition: No needs currently    Follow up: RTC in 1 week, repeat ECG for HR and QTc monitoring    The patients questions were answered to her satisfaction.    Pt. seen and discussed with Dr. Seward Speck MD  Hematology/Oncology Fellow

## 2017-06-20 NOTE — Unmapped (Signed)
[facility] Triage Note     Patient: [name] [name]     Reason for call:  home health and Plurex questions    Time call returned: 0810     Phone Assessment: Spoke with patient's son, they are currently at [facility] for appointments this AM. He wanted to know when home health would be set up and had questions about her Plurex drain dressing changes and care.      Triage Recommendations: Notified patient that home health order was placed and they should be contacted him soon to set up a time to come. Asked him to speak with his NN and Dr. Lelon Mast at appointment today regarding more details about home health and dressing changes.     Patient Response: Patient's son verbalized understanding     Outstanding tasks: Please touch base with patient and son if able to do so while in clinic. He would like to know when he may expect home health to start, what they will be doing for the patient and how often the Plurex dressing will be changed.     Patient Pharmacy has been verified and primary pharmacy has been marked as preferred

## 2017-06-20 NOTE — Unmapped (Signed)
0830:  Labs drawn and sent for analysis.  Care provided by  Eppie Gibson, RN

## 2017-06-20 NOTE — Unmapped (Addendum)
It was a pleasure to see you in clinic today.  - We discussed that the molecular test showed a mutation in a gene called MET. There is an oral drug that targets this mutation called crizotinib.  - Crizotinib is a taken as a pill twice a day  - you can get the shingles shot, called Shingrix which is given two parts    Please call our nurse navigator if you have any interval questions or concerns:  Nurse Navigator: Leisa Lenz, RN:  [email or web address]  Nurse Triage Line: [phone]  ??  For emergencies, evenings or weekends, please call [phone] and ask for oncology fellow on call.  ??  For appointments, please call [phone].  For [facility] pharmacy, please call [phone].

## 2017-06-20 NOTE — Unmapped (Signed)
Clinical Pharmacist Practitioner- Lung Cancer Clinic    ORAL CHEMOTHERAPY NEW START     Patient Information: [name] [name] is a 84 y.o. year-old female with NSCLC with a MET exon 14 skip mutation who I am counseling today on crizotinib.     Diagnosis and Appropriateness of therapy: [name] [name] has stage IV disease harboring a deletion in MET exon 14 (confirmed with STRATA and both DNA and RNA based testing). Given her age, we do not recommend cytotoxic chemotherapy if targeted therapy is an option. We do not have PDL1 expression at this time.     Pharmacy: [facility]  ?? Contact information - [facility] [phone] option 4 or [phone] option 4.  ?? A pharmacy representative will contact the patient monthly to remind the patient to set up their refill.  Refill reminder calls will be place 7-10 days prior to the patient???s subsequent needed refill.  ?? Emphasized the importance of answering phone calls from the [facility] to prevent delays in therapy.  The pharmacy must speak to the patient to schedule the refill.  ?? The pharmacy is open Monday through Friday 8:30am-4:30pm.  A pharmacist is available 24/7 via pager to answer any clinical questions they may have.  ?? Patient will receive medication through common mail carrier or courier service.      Medication Access issues: Copay $8.50    Confirmed Address and Phone number: Confirmed in EPIC/FSI    PMH/comorbidities: reviewed in Epic  - She has a history of prolonged QTc, with her last EKG demonstrating a QTc of and sinus tachycardia (obtained when patient presented to the ED on 4/6). We obtained a new baseline EKG today that had a QTc of with the patient being in normal sinus rhythm. We will closely follow EKGs in this patient and obtain a follow-up EKG shortly after initiating therapy.     Current medications: reviewed    Allergies: reviewed    Cultural assessment:   -Relevant cultural assessment and health literacy was completed.  Patient is able to store his/her medication as directed  -This patient does not have any cognitive or physical disabilities   -This patient does not speak any other languages.      Education points:  1. Adherence to oral chemotherapy was discussed. If the patient is having difficulty remembering to take her dose there are a number of strategies that we can use in order to help.     2. Food/drug Considerations: crizotinib can be taken with or without food.    3. The following adverse effects were discussed, including related supportive care recommendations and emergency situations:        A. Nausea   B. Fatigue   C. Diarrhea/Constipation   D. Edema   E. Neuropathy   F. Visual changes   G. Pneumonitis   H. QTc prolongation/bradycardia    4. Drug Drug Interactions:   None noted on profile     [name] [name] was provided a written information from [facility] at this visit. They will receive a Conservation officer, historic buildings from [facility] with their first shipment of medication.     5. Reviewed safe drug handling and disposal.    Delivery Date:   ?? Signature  IS NOT required    Plan: START crizotinib 250mg  PO BID. She will take her first dose with breakfast and the next dose prior to bedtime. Deliery scheduled for Thursday 4/25 with expected start date Friday 4/26.  F/u: 1 week with EKG    I spent 30 minutes with [name] [name] in direct patient care.    Leotis Shames, PharmD, BCOP, CPP

## 2017-06-20 NOTE — Unmapped (Signed)
NURSE NAVIGATION TRACKING TOOL    Name:  [name] [name]  MR#:  [id number]  DOB:  [birth year]  Encounter Date:  06/20/2017    Diagnosis:   NSCLC newly dx   Navigator:     Jantz Main, RN, OCN       Part I:    Location: []  NN Office   []  MD Office []   Hospital  [x]  Clinic  []   Home         []  Nursing Home  []  Community   [] Telephone     [] Email                    []  Other:            Reason for Call or Visit:  []  Initial Assessment (New)   [] Intervention (Repeat)         [x] Other:            []  High Risk Patient     ECOG:          Current NCCN Distress Thermometer (0-10)          []  Familiar History of Cancer          (breast, colon) [x]  > 30 Years Old []  Low Health Literacy   []  > 4 Medications []  > 2 Co-Morbidities []  Multiple ED Visits   []  Lack of Social Support []  Language Barrier []  Uninsured   [x]  Recent D/C from Hospital []  []      Part II:  Barriers to Care / Interventions:    []  (B) []  (I) Coordination of Care: []  Appointments, []  Medical Records, []  Provider   [x]  Other:  Home health referral - son stated he was called this a.m. And home health will come out when returns home today. Supplies ordered thru [facility] to come to home.        []  (B) []  (I) []  Patient, []  Provider(s):         []  (B) [x]  (I) Education: []  Disease, []  Symptom management, []  Medication Changes,   [x]  Other:  Pleurx catheter       []  (B) [x]  (I) Emotional Support / Family Counseling:   Support to patient and family       []  (B) [x]  (I) Assistance: []  Financial, []  Disability, []  Co-Pay Assistance,  []  Prescription Refills, X   Other:   Pleurx supplies ordered to be deliviered to home       []  (B) []  (I) Practical Needs, Family Problems, Treatment Decisions:         []  (B) []  (I) Survivorship, Surveillance:         []  (B) []  (I) Transportation:         []  (B) []  (I) Treatment Non-Compliance (missed appointments/medications):         []  (B) [x]  (I) Other:    Referral to [facility] health with request for SW assistance with home care with mother and respite for son and DIL             Part III:  Access to Support Services/Referrals    []  Advanced Directives   []   Lymphoma  []  Cancer Prevention/Screening   []   Nurse Navigator  []  Cancer Rehab/Wellness   []   Ostomy  []  Clergy / Spiritual Support   []   PT / OT / Speech  []  Clinical Trials    []   Primary Care Provider  []  Community Care Clinic   []   Psycho-Social Support  []  Dietician     []   Return From 2nd Opinion  []  Diagnostic Test; labs; Transfusions []   Second Opinion  []  Fertility / Sexuality    []   Smoking Cessation  []  Financial Counselors   []   Engineer, site / Public Health  []  Genetic Counselor    [x]   Social Worker  [x]  Home Care    []   Specialist  []  Hospice / Palliative Care   []   Survivorship / Support Program  []  Integrative Medicine   []   Transportation  []  Lodging     []   Other:            DATE FOR FOLLOW UP:        []  PCP   []  Surgeon   [x]   Medical Oncologist   []  Radiation Oncologist   []   Engineer, water given. Support given.  Reminder to call Writer if any questions or needs.

## 2017-06-20 NOTE — Unmapped (Signed)
Call from son- stated just returned home from procedure for pleurx catheter placement.  Questioning when home health would come out and about supplies.   Instructed son that home Health referral placed and they would call him to set up appt for interview and eval.  Son verified that they had called but wanted to know when to come out. Scared mother would need help with drainage. Verified referral.   Call to [facility] health - verified they would accept and would be going out for admission and teaching.   Aware requested social work intervention and evaluation for support in home.

## 2017-06-21 DIAGNOSIS — I10 Essential (primary) hypertension: Secondary | ICD-10-CM

## 2017-06-21 DIAGNOSIS — I2699 Other pulmonary embolism without acute cor pulmonale: Secondary | ICD-10-CM

## 2017-06-21 DIAGNOSIS — D510 Vitamin B12 deficiency anemia due to intrinsic factor deficiency: Secondary | ICD-10-CM

## 2017-06-21 DIAGNOSIS — R160 Hepatomegaly, not elsewhere classified: Secondary | ICD-10-CM

## 2017-06-21 DIAGNOSIS — Z79899 Other long term (current) drug therapy: Secondary | ICD-10-CM

## 2017-06-21 DIAGNOSIS — C3492 Malignant neoplasm of unspecified part of left bronchus or lung: Principal | ICD-10-CM

## 2017-06-21 DIAGNOSIS — Z4803 Encounter for change or removal of drains: Secondary | ICD-10-CM

## 2017-06-21 DIAGNOSIS — E119 Type 2 diabetes mellitus without complications: Secondary | ICD-10-CM

## 2017-06-21 DIAGNOSIS — N6310 Unspecified lump in the right breast, unspecified quadrant: Secondary | ICD-10-CM

## 2017-06-21 DIAGNOSIS — E46 Unspecified protein-calorie malnutrition: Secondary | ICD-10-CM

## 2017-06-21 DIAGNOSIS — J910 Malignant pleural effusion: Secondary | ICD-10-CM

## 2017-06-21 DIAGNOSIS — C7931 Secondary malignant neoplasm of brain: Secondary | ICD-10-CM

## 2017-06-21 DIAGNOSIS — C787 Secondary malignant neoplasm of liver and intrahepatic bile duct: Secondary | ICD-10-CM

## 2017-06-21 DIAGNOSIS — I4891 Unspecified atrial fibrillation: Secondary | ICD-10-CM

## 2017-06-21 DIAGNOSIS — R918 Other nonspecific abnormal finding of lung field: Secondary | ICD-10-CM

## 2017-06-21 DIAGNOSIS — Z7901 Long term (current) use of anticoagulants: Secondary | ICD-10-CM

## 2017-06-21 DIAGNOSIS — Z794 Long term (current) use of insulin: Secondary | ICD-10-CM

## 2017-06-21 DIAGNOSIS — E440 Moderate protein-calorie malnutrition: Secondary | ICD-10-CM

## 2017-06-21 MED FILL — XALKORI/250MG/CAPS: XALKORI/250MG/CAPS | 30 days supply | Qty: 60 | Fill #0

## 2017-06-21 NOTE — Unmapped (Signed)
Addended by: Theo Dills on: 06/21/2017 01:56 PM     Modules accepted: Orders

## 2017-06-21 NOTE — Unmapped (Signed)
Son called first thing and asking when nurse coming out.  Stated came yesterday and knew nothing about Pleurx and couldn't do it.  Call to [facility] and found that was the intake nurse only.  Nurse scheduled to go out today.  Patient had been on home health services and was discharged after admission.  Writer was not aware of home health being out.  Orders confirmed and to drain to tolerance QOD and prn - monitor amounts of drainage.  Spoke to nurse over case and stated that son was not coping well with mother not being strong and independent as she has been before.  Requesting Social worker to come out and eval situation - she spoke to son who wanted to wait until next week to figure out what he needed.  Reiterated to son that home health SW could help with figuring out needs.  Writer very concerned over son ability to cope with changes and needs.  Called son back explaining that she was the intake person and nurse would be out today.  Asked if needed anything else and son stated mother coughing and he was going to take to the ER.  Denied any distress just coughing and he was worried about fluid.  Begged son to not go to the ER and to let home health know so they could come out and help.  Son agreed.  Call to [facility] and made aware of situation and son wanting to take to the ER.  Nurse stated she would call Nettie Elm the nurse and make her aware and have her call the son now.  Encouraged son to use the phone number on the Home Health folder to call with any questions or concerns, that I could not assist in the time of the visit but they could have the nurse call.   Agreed and understood.  Support given that patient would be taken care of and to wait and take deep breath.  VERY anxious. Agreed with plan to wait for home health.   No other issues noted at this time.   Encouraged to call home health with any further questions about home care and to call writer with any further needs.   Will check on them later in day or in a.m. For update.

## 2017-06-22 MED ORDER — FUROSEMIDE 20 MG TABLET
ORAL_TABLET | 0 refills | 0 days | Status: CP
Start: 2017-06-22 — End: 2017-08-01

## 2017-06-23 DIAGNOSIS — C787 Secondary malignant neoplasm of liver and intrahepatic bile duct: Secondary | ICD-10-CM

## 2017-06-23 DIAGNOSIS — N6310 Unspecified lump in the right breast, unspecified quadrant: Secondary | ICD-10-CM

## 2017-06-23 DIAGNOSIS — Z7901 Long term (current) use of anticoagulants: Secondary | ICD-10-CM

## 2017-06-23 DIAGNOSIS — E119 Type 2 diabetes mellitus without complications: Secondary | ICD-10-CM

## 2017-06-23 DIAGNOSIS — Z79899 Other long term (current) drug therapy: Secondary | ICD-10-CM

## 2017-06-23 DIAGNOSIS — I10 Essential (primary) hypertension: Secondary | ICD-10-CM

## 2017-06-23 DIAGNOSIS — C7931 Secondary malignant neoplasm of brain: Secondary | ICD-10-CM

## 2017-06-23 DIAGNOSIS — E46 Unspecified protein-calorie malnutrition: Secondary | ICD-10-CM

## 2017-06-23 DIAGNOSIS — C3492 Malignant neoplasm of unspecified part of left bronchus or lung: Principal | ICD-10-CM

## 2017-06-23 DIAGNOSIS — I2699 Other pulmonary embolism without acute cor pulmonale: Secondary | ICD-10-CM

## 2017-06-23 DIAGNOSIS — J910 Malignant pleural effusion: Secondary | ICD-10-CM

## 2017-06-23 DIAGNOSIS — Z4803 Encounter for change or removal of drains: Secondary | ICD-10-CM

## 2017-06-23 DIAGNOSIS — I4891 Unspecified atrial fibrillation: Secondary | ICD-10-CM

## 2017-06-23 DIAGNOSIS — D510 Vitamin B12 deficiency anemia due to intrinsic factor deficiency: Secondary | ICD-10-CM

## 2017-06-23 LAB — ABNORMAL HEMOGLOBIN CONFIRM: Lab: ABNORMAL

## 2017-06-23 LAB — HEMOGLOBIN F QUANTITATION: Lab: <0.4

## 2017-06-23 LAB — HEMOGLOBIN/THALASSEMIA PROFILE
HEMOGLOBIN A2 QUANTITATION: 3.1 % (ref 1.5–3.5)
HEMOGLOBIN F QUANTITATION: <0.4 % (ref <=1.9)

## 2017-06-26 DIAGNOSIS — E46 Unspecified protein-calorie malnutrition: Secondary | ICD-10-CM

## 2017-06-26 DIAGNOSIS — I4891 Unspecified atrial fibrillation: Secondary | ICD-10-CM

## 2017-06-26 DIAGNOSIS — Z7901 Long term (current) use of anticoagulants: Secondary | ICD-10-CM

## 2017-06-26 DIAGNOSIS — N6310 Unspecified lump in the right breast, unspecified quadrant: Secondary | ICD-10-CM

## 2017-06-26 DIAGNOSIS — Z4803 Encounter for change or removal of drains: Secondary | ICD-10-CM

## 2017-06-26 DIAGNOSIS — I2699 Other pulmonary embolism without acute cor pulmonale: Secondary | ICD-10-CM

## 2017-06-26 DIAGNOSIS — E119 Type 2 diabetes mellitus without complications: Secondary | ICD-10-CM

## 2017-06-26 DIAGNOSIS — J910 Malignant pleural effusion: Secondary | ICD-10-CM

## 2017-06-26 DIAGNOSIS — C7931 Secondary malignant neoplasm of brain: Secondary | ICD-10-CM

## 2017-06-26 DIAGNOSIS — Z79899 Other long term (current) drug therapy: Secondary | ICD-10-CM

## 2017-06-26 DIAGNOSIS — C3492 Malignant neoplasm of unspecified part of left bronchus or lung: Principal | ICD-10-CM

## 2017-06-26 DIAGNOSIS — I10 Essential (primary) hypertension: Secondary | ICD-10-CM

## 2017-06-26 DIAGNOSIS — C787 Secondary malignant neoplasm of liver and intrahepatic bile duct: Secondary | ICD-10-CM

## 2017-06-26 DIAGNOSIS — D510 Vitamin B12 deficiency anemia due to intrinsic factor deficiency: Secondary | ICD-10-CM

## 2017-06-26 NOTE — Unmapped (Signed)
[facility] out to see patient for pleurx care eval.  Son had called prior to arrival stating mother was SOB and was going to take to ER.  Discouraged to take to ER.  Home Health RN sent to home. Patient and family in panic and very stressed.  Emotional support to all to calm down.  Care and education to pleurx catheters given by [facility] Nurse.    Supply request sent to [facility] to request supplies per MD request.  Supply request updated for bilateral pleurx catheters.   Will follow and continue to provide support to family and patient.

## 2017-06-26 NOTE — Unmapped (Signed)
Clinical Pharmacist Practitioner Lung Cancer [facility]: PHONE CONSULT    [name] [name] is a 84 y.o. 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 I called and spoke to [place]. I did not speak directly to Ms. Dinapoli on this call.     Current cancer therapy: crizotinib 250mg  PO once daily. Start Date:06/23/2017    HPI: Started crizotinib on Friday 4/26. [name] reports that patient was doing fine until yesterday afternoon, when she was more lethargic and had an episode where she was very weak and required support after getting up from her chair to walk to the bedroom. This prompted him to stop the crizotinib.     PLAN:   1. RESTART crizotinib 250mg  PO BID starting tonight at 8pm. She will also take her morning dose tomorrow around 8am so that we can obtain an EKG to verify QTc interval on crizotinib.     ASSESSMENT:   Son reports that his mom has been more lethargic lately and this is worrying to him. Notes that she was typically a go-getter but is requiring increasing support. He states that she was doing fine on the crizotinib until yesterday afternoon. Took her am dose. Was having chest tightness early in the morning so they drained fluid from PleurX catheters ( total, which is about what they have been draining). Grandchildren came over around noon and he notes that his mother was not engaged with them. After they left she got up to walk to her bedroom and became weak and asked for assistance. He was worried that she would have fallen if he had not been there to provide her with support. No other episodes of this. He states she laid down and was back to her normal self later in the evening, but he held her dose of crizotinib. He also held the dose this morning. Reports patient is eating and drinking as normal. She does have home health support. He was reading the information on crizotinib and saw that fatigue was a side effect so was concerned to continue the medication.    He is also concerned that his mother felt unable to burp yesterday for a while, but finally belched after eating lunch. He inquires about medication for indigestion. I advised that we should discuss the symptoms with his mother tomorrow during her clinica appointment, as we will need more information to determine if there is an appropriate medication to address her complaints.     We discussed that I cannot be sure that this episode was due to the medication, as his mother has many factors that could contribute to fatigue and weakness (advanced age, metastatic disease, pleural effusion requiring drainage, recent hospitalizations). Given the rapid onset and resolution of this event, and the need to treat her cancer, I recommended restarting the crizotinib with close monitoring. She has a scheduled appointment tomorrow, at which time we can to a physical exam and discuss further if dose decreases are warranted. I discussed that we need an EKG with her taking the medication to monitor closely for cardiac toxicity (I.e. Bradycardia or prolonged QTc).     [name] voiced his agreement with the plan and his understanding.     FOLLOW-UP: appt tomorrow with Terance Hart and we will obtain an EKG at this time.  A total of 20 minutes was spent on this phone call.     Leotis Shames, PharmD, BCOP, CPP

## 2017-06-27 ENCOUNTER — Other Ambulatory Visit: Admit: 2017-06-27 | Discharge: 2017-06-27 | Payer: MEDICARE

## 2017-06-27 ENCOUNTER — Ambulatory Visit
Admit: 2017-06-27 | Discharge: 2017-06-27 | Payer: MEDICARE | Attending: Physician Assistant | Primary: Physician Assistant

## 2017-06-27 ENCOUNTER — Ambulatory Visit: Admit: 2017-06-27 | Discharge: 2017-06-27 | Payer: MEDICARE

## 2017-06-27 DIAGNOSIS — R1310 Dysphagia, unspecified: Principal | ICD-10-CM

## 2017-06-27 DIAGNOSIS — C787 Secondary malignant neoplasm of liver and intrahepatic bile duct: Secondary | ICD-10-CM

## 2017-06-27 DIAGNOSIS — E119 Type 2 diabetes mellitus without complications: Secondary | ICD-10-CM

## 2017-06-27 DIAGNOSIS — C3490 Malignant neoplasm of unspecified part of unspecified bronchus or lung: Secondary | ICD-10-CM

## 2017-06-27 DIAGNOSIS — C3492 Malignant neoplasm of unspecified part of left bronchus or lung: Principal | ICD-10-CM

## 2017-06-27 DIAGNOSIS — Z4803 Encounter for change or removal of drains: Secondary | ICD-10-CM

## 2017-06-27 DIAGNOSIS — I2699 Other pulmonary embolism without acute cor pulmonale: Secondary | ICD-10-CM

## 2017-06-27 DIAGNOSIS — I10 Essential (primary) hypertension: Secondary | ICD-10-CM

## 2017-06-27 DIAGNOSIS — Z5181 Encounter for therapeutic drug level monitoring: Secondary | ICD-10-CM

## 2017-06-27 DIAGNOSIS — D510 Vitamin B12 deficiency anemia due to intrinsic factor deficiency: Secondary | ICD-10-CM

## 2017-06-27 DIAGNOSIS — C7931 Secondary malignant neoplasm of brain: Secondary | ICD-10-CM

## 2017-06-27 DIAGNOSIS — J910 Malignant pleural effusion: Secondary | ICD-10-CM

## 2017-06-27 DIAGNOSIS — N6310 Unspecified lump in the right breast, unspecified quadrant: Secondary | ICD-10-CM

## 2017-06-27 DIAGNOSIS — Z79899 Other long term (current) drug therapy: Secondary | ICD-10-CM

## 2017-06-27 DIAGNOSIS — Z7901 Long term (current) use of anticoagulants: Secondary | ICD-10-CM

## 2017-06-27 DIAGNOSIS — I4891 Unspecified atrial fibrillation: Secondary | ICD-10-CM

## 2017-06-27 DIAGNOSIS — E46 Unspecified protein-calorie malnutrition: Secondary | ICD-10-CM

## 2017-06-27 LAB — CBC W/ AUTO DIFF
BASOPHILS ABSOLUTE COUNT: 0 10*9/L (ref 0.0–0.1)
BASOPHILS RELATIVE PERCENT: 0.2 %
EOSINOPHILS ABSOLUTE COUNT: 0.1 10*9/L (ref 0.0–0.4)
EOSINOPHILS RELATIVE PERCENT: 0.9 %
HEMATOCRIT: 29.2 % — ABNORMAL LOW (ref 36.0–46.0)
HEMOGLOBIN: 10.9 g/dL — ABNORMAL LOW (ref 12.0–16.0)
LARGE UNSTAINED CELLS: 1 % (ref 0–4)
LYMPHOCYTES ABSOLUTE COUNT: 1.1 10*9/L — ABNORMAL LOW (ref 1.5–5.0)
LYMPHOCYTES RELATIVE PERCENT: 11 %
MEAN CORPUSCULAR HEMOGLOBIN CONC: 37.3 g/dL — ABNORMAL HIGH (ref 31.0–37.0)
MEAN CORPUSCULAR HEMOGLOBIN: 22.5 pg — ABNORMAL LOW (ref 26.0–34.0)
MEAN CORPUSCULAR VOLUME: 60.4 fL — ABNORMAL LOW (ref 80.0–100.0)
MEAN PLATELET VOLUME: 7.1 fL (ref 7.0–10.0)
MONOCYTES RELATIVE PERCENT: 3.4 %
NEUTROPHILS ABSOLUTE COUNT: 8.3 10*9/L — ABNORMAL HIGH (ref 2.0–7.5)
NEUTROPHILS RELATIVE PERCENT: 83.7 %
PLATELET COUNT: 351 10*9/L (ref 150–440)
RED CELL DISTRIBUTION WIDTH: 19.1 % — ABNORMAL HIGH (ref 12.0–15.0)
WBC ADJUSTED: 9.9 10*9/L (ref 4.5–11.0)

## 2017-06-27 LAB — COMPREHENSIVE METABOLIC PANEL
ALBUMIN: 2.9 g/dL — ABNORMAL LOW (ref 3.5–5.0)
ALKALINE PHOSPHATASE: 110 U/L (ref 38–126)
ALT (SGPT): 17 U/L (ref 15–48)
ANION GAP: 7 mmol/L — ABNORMAL LOW (ref 9–15)
AST (SGOT): 11 U/L — ABNORMAL LOW (ref 14–38)
BILIRUBIN TOTAL: 0.7 mg/dL (ref 0.0–1.2)
BLOOD UREA NITROGEN: 16 mg/dL (ref 7–21)
BUN / CREAT RATIO: 23
CALCIUM: 9 mg/dL (ref 8.5–10.2)
CO2: 30 mmol/L (ref 22.0–30.0)
CREATININE: 0.7 mg/dL (ref 0.60–1.00)
EGFR MDRD AF AMER: >=60 mL/min/{1.73_m2} (ref >=60)
EGFR MDRD NON AF AMER: >=60 mL/min/{1.73_m2} (ref >=60)
GLUCOSE RANDOM: 284 mg/dL — ABNORMAL HIGH (ref 65–179)
POTASSIUM: 3.7 mmol/L (ref 3.5–5.0)
PROTEIN TOTAL: 6 g/dL — ABNORMAL LOW (ref 6.5–8.3)
SODIUM: 134 mmol/L — ABNORMAL LOW (ref 135–145)

## 2017-06-27 LAB — SLIDE REVIEW

## 2017-06-27 LAB — SMEAR REVIEW

## 2017-06-27 LAB — CHLORIDE: Chloride:SCnc:Pt:Ser/Plas:Qn:: 97 — ABNORMAL LOW

## 2017-06-27 LAB — RED CELL DISTRIBUTION WIDTH: Lab: 19.1 — ABNORMAL HIGH

## 2017-06-27 MED ORDER — SIMETHICONE 80 MG CHEWABLE TABLET
ORAL_TABLET | Freq: Four times a day (QID) | ORAL | 2 refills | 0.00000 days | Status: SS | PRN
Start: 2017-06-27 — End: 2018-01-22

## 2017-06-27 MED ORDER — RANITIDINE 150 MG TABLET
ORAL_TABLET | Freq: Two times a day (BID) | ORAL | 1 refills | 0.00000 days | Status: CP
Start: 2017-06-27 — End: 2017-08-01

## 2017-06-27 NOTE — Unmapped (Signed)
Patient's son called [facility] to question if Cleotis Lema can cause high blood sugars. He reports fastings and post-breakfast readings in the 500s. He reports this is unusual for her. No differences in meal last night/ no overnight feeding. No new pain, and no changes in drainage fluid.    I called [facility], who will reach out to patient's son. They have appointment today, and will plan to draw labs to see what's going on.    Barbera Perritt A. Katrinka Blazing, PharmD - Pharmacist   [facility] : [facility]   [address], [place], [place] [address]   t [phone] - f [phone]

## 2017-06-27 NOTE — Unmapped (Signed)
Received call from [facility] pharmacy [facility]) that [name] called them this morning to report that Ms. Moccia's blood sugar was 500 this morning. Megan asked that I call the son to discuss further. They have a clinic appointment with [place] today at 1:30.     I called [name]. He says that his Mom's blood sugar has actually been low recently, and that her primary care doctor has recently decreased her insulin 70/30 to 5 units BID. This morning her fasting BG was 500 (he says really high), although it was difficult to get a clear history of the timing and what she ate. [name] said that since her blood sugar was so high he gave her 12 units of insulin. They rechecked after breakfast and it was still high. I advised that they come in to clinic and we will check labs today to ensure there are no other issues.     While hyperglycemia has been reported with other ALK inhibitors, it is very rarely reported with crizotinib and I would not expect these spikes after a single dose, which she restarted last night. Will discuss further the trend in her sugars during her clinic appointment.     I asked for a lab appointment to be added and entered orders for labs with her visit.     Leotis Shames, PharmD, BCOP, CPP  Pager [phone]

## 2017-06-27 NOTE — Unmapped (Signed)
It was a pleasure to see you today.      Take ranitidine (zantac) twice daily.  Also use Gas-X or simethicone as needed during the day for gas relief.   If your symptoms persist, we will plan to get a swallowing study.     Please call our nurse navigator if you have any interval questions or concerns:    For appointments, please call [phone].  For [facility] pharmacy, please call [phone].    Nurse Navigator:  Nurse Triage Line: [phone]  Leisa Lenz, RN:  [email or web address]    For emergencies, evenings or weekends, please call [phone] and ask for oncology fellow on call.    Reasons to call emergency line may include:  Fever of 100.5 or greater  Nausea and/or vomiting not relieved with nausea medicine  Diarrhea or constipation not relieved with bowel regimen  Severe pain not relieved with usual pain regimen      Terance Hart, PA  Medical Oncology  [facility]  [address]  [place], [place] [address]  Fax: [phone]      Lab on 06/27/2017   Component Date Value Ref Range Status   ??? Sodium 06/27/2017 134* 135 - 145 mmol/L Final   ??? Potassium 06/27/2017 3.7  3.5 - 5.0 mmol/L Final   ??? Chloride 06/27/2017 97* 98 - 107 mmol/L Final   ??? CO2 06/27/2017 30.0  22.0 - 30.0 mmol/L Final   ??? BUN 06/27/2017 16  7 - 21 mg/dL Final   ??? Creatinine 06/27/2017 0.70  0.60 - 1.00 mg/dL Final   ??? BUN/Creatinine Ratio 06/27/2017 23   Final   ??? EGFR MDRD Non Af Amer 06/27/2017 >=60  >=60 mL/min/1.34m2 Final   ??? EGFR MDRD Af Amer 06/27/2017 >=60  >=60 mL/min/1.13m2 Final   ??? Anion Gap 06/27/2017 7* 9 - 15 mmol/L Final   ??? Glucose 06/27/2017 284* 65 - 179 mg/dL Final   ??? Calcium 95/18/8416 9.0  8.5 - 10.2 mg/dL Final   ??? Albumin 60/63/0160 2.9* 3.5 - 5.0 g/dL Final   ??? Total Protein 06/27/2017 6.0* 6.5 - 8.3 g/dL Final   ??? Total Bilirubin 06/27/2017 0.7  0.0 - 1.2 mg/dL Final   ??? AST 10/93/2355 11* 14 - 38 U/L Final   ??? ALT 06/27/2017 17  15 - 48 U/L Final   ??? Alkaline Phosphatase 06/27/2017 110  38 - 126 U/L Final   ??? WBC 06/27/2017 9.9  4.5 - 11.0 10*9/L Final   ??? RBC 06/27/2017 4.84  4.00 - 5.20 10*12/L Final   ??? HGB 06/27/2017 10.9* 12.0 - 16.0 g/dL Final   ??? HCT 73/22/0254 29.2* 36.0 - 46.0 % Final   ??? MCV 06/27/2017 60.4* 80.0 - 100.0 fL Final   ??? MCH 06/27/2017 22.5* 26.0 - 34.0 pg Final   ??? MCHC 06/27/2017 37.3* 31.0 - 37.0 g/dL Final   ??? RDW 27/07/2374 19.1* 12.0 - 15.0 % Final   ??? MPV 06/27/2017 7.1  7.0 - 10.0 fL Final   ??? Platelet 06/27/2017 351  150 - 440 10*9/L Final   ??? Variable HGB Concentration 06/27/2017 Slight* Not Present Final   ??? Neutrophils % 06/27/2017 83.7  % Final   ??? Lymphocytes % 06/27/2017 11.0  % Final   ??? Monocytes % 06/27/2017 3.4  % Final   ??? Eosinophils % 06/27/2017 0.9  % Final   ??? Basophils % 06/27/2017 0.2  % Final   ??? Absolute Neutrophils 06/27/2017 8.3* 2.0 - 7.5 10*9/L Final   ???  Absolute Lymphocytes 06/27/2017 1.1* 1.5 - 5.0 10*9/L Final   ??? Absolute Monocytes 06/27/2017 0.3  0.2 - 0.8 10*9/L Final   ??? Absolute Eosinophils 06/27/2017 0.1  0.0 - 0.4 10*9/L Final   ??? Absolute Basophils 06/27/2017 0.0  0.0 - 0.1 10*9/L Final   ??? Large Unstained Cells 06/27/2017 1  0 - 4 % Final   ??? Microcytosis 06/27/2017 Marked* Not Present Final   ??? Anisocytosis 06/27/2017 Moderate* Not Present Final   ??? Hyperchromasia 06/27/2017 Marked* Not Present Final   ??? Smear Review Comments 06/27/2017 See Comment* Undefined Final   ??? Target Cells 06/27/2017 Moderate* Not Present Final   ??? Poikilocytosis 06/27/2017 Marked* Not Present Final

## 2017-06-27 NOTE — Unmapped (Signed)
Reason for visit: [name] [name] is seen at the request of Dr. Curt Bears  for a thoracic medical oncology opinion regarding new diagnosis of Stage IV NSCLC.      HPI:  [name] [name] is an 84 yo F, never smoker, who initially became symptomatic  with dyspnea and cough in late Feb 2019.   History as follows:  Oncology History    - 04/30/17: Presented to ED with dyspnea for over 2 weeks.  CTA with PEs, b/l pleural effusions, mediastinal soft tissue mass 9/5/7cm, invading LUL nd 8.2cm lesion inleft hepatic lobe  - 05/17/17: b/l thoracentesis with 2L removed each lung.  Cytology with no malignant cells but lymphocyte predominant exudative effusiions  - 05/18/17: Biopsy liver lesion c/w lung adenocarcinoma        She lives with her her 2 teenage grandsons and has attentive sons/daughter close by.  Her daughter-in-law has been sleeping overnight with her since diagnosis.  She dresses herself, showers, able to do ADLs but family does most of cooking, grocery shopping and cleaning.  Her son manages her medicines and finances. She is otherwise feeling reasonable well.  Has some fatigue. No falls. Does not use a walker or cane.  She is able to walk around [place] but after exertion like this, has some chest pressure and has to sit down to rest, with resolution usually by .  No stairs at home.  She has insulin dependent DM, HTN and Afib, controlled with meds.      Interval Hx  Initiated crizotinib on 06/23/17. Developed episode of severe weakness about 2 days after starting medication. She required assistance to get up from chair and walk to her bedroom. Her son became concerned and stopped the medication. Pt notes a new sensation of a bubble in her lower sternum. Feels like food is getting stuck or sitting her lower esophagus after eating and drinking. Having some temporary relief with belching. Pt's BG has also been high into the 500 range as recently as this AM based on her home monitor. She admits to spending more time reclined over the past week. Also recently discovered a love for tacos. Denies any acid reflux symptoms, D/C, or N/V. Bilateral pleurex catheters in place and draining without difficulty. No change in breathing or cough.            ROS:  10 systems were reviewed and found to be within normal limits except as described in the HPI.    MEDICATIONS:  Current Outpatient Medications on File Prior to Visit   Medication Sig Dispense Refill   ??? amLODIPine (NORVASC) 10 MG tablet Take 1 tablet by mouth.     ??? apixaban (ELIQUIS) 5 mg Tab 2 tabs (10 mg) twice a day for 7 days, then 1 tab (5 mg) twice a day (Patient taking differently: 5 mg Two (2) times a day. 2 tabs (10 mg) twice a day for 7 days, then 1 tab (5 mg) twice a day) 60 tablet 1   ??? crizotinib (XALKORI) 250 mg capsule Take 1 capsule (250 mg total) by mouth Two (2) times a day. 60 capsule 3   ??? furosemide (LASIX) 20 MG tablet TAKE 1 TABLET BY MOUTH EVERY DAY 30 tablet 0   ??? insulin aspart protamine-insulin aspart (NOVOLOG MIX 70-30FLEXPEN U-100) 100 unit/mL (70-30) injection pen TAKE 10 UNITS 30 MINUTES BEFORE BREAKFAST AND 8 UNITS 30 MINUTES BEFORE SUPPER.     ??? losartan (COZAAR) 100 MG tablet Take 1 tablet by mouth.     ???  potassium chloride SA (K-DUR,KLOR-CON) 20 MEQ tablet Take 1 tablet (20 mEq total) by mouth daily. 30 tablet 3   ??? blood sugar diagnostic (ACCU-CHEK AVIVA) Strp      ??? flash glucose scanning reader 1 each.     ??? flash glucose sensor kit 3 each.     ??? pen needle, diabetic (BD ULTRA-FINE SHORT PEN NEEDLE) 31 gauge x 5/16 Ndle USE AS DIRECTED. USE 4 NEEDLES A DAY WITH DIABETIC MEDICATION PENS AS DIRECTED       No current facility-administered medications on file prior to visit.        ALLERGIES:   Allergies   Allergen Reactions   ??? Sulfasalazine Nausea Only       PMH:  Past Medical History:   Diagnosis Date   ??? Atrial fibrillation (CMS-HCC)    ??? Diabetes mellitus (CMS-HCC)    ??? Hypertension        SOCIAL HISTORY:  Lives with her 2 teenage grandsons. Her sons, daughter live nearby.    Never smoker  No alcohol    FAMILY HISTORY:  No hx of cancer in her parents.  Daughter has breast cancer.  Another daughter died of lung cancer and was a heavy smoker for many years.     PHYSICAL EXAMINATION:  BP 117/82  - Pulse 94  - Temp 36.8 ??C (98.2 ??F) (Oral)  - Resp 16  - Ht 162.6 cm (5' 4)  - Wt 51.4 kg (113 lb 6.4 oz)  - SpO2 100%  - BMI 19.47 kg/m??     ECOG 1  GENERAL: Thin, pleasant woman not in acute distress  MOUTH: Moist and clear without thrush  NECK: Supple  LYMPH: No cervical or supraclavicular lymphadenopathy  CHEST: CTAB  CV: Nl s1/s2  ABD: Soft, not tender, not distended, no palpable organomegaly  EXTREMITIES: Warm and well perfused x 4 without cyanosis, clubbing or edema  MUSKOLOSKELETAL: No spinal tenderness to palpation  NEURO: CN2-12 intact.  Speech and gait within normal limits  AFFECT: Varied and appropriate to situation  DERM: No lesions noted    LABS:    RADIOLOGY:  MRI Brain 05/30/17  --Multiple small subacute acute/subacute right cerebral infarcts.  --Numerous small enhancing lesions in the right cerebral hemisphere, some of which are concerning for metastases, while some are more consistent with subacute infarct.  --Left frontal calvarial metastasis with epidural/dural extension.  --Small dural based lesion at the left paramedian vertex, meningioma or venous structure favored over metastasis.    CT-C 05/11/17  - Left upper lobe 10 cm mass and right upper lobe 2.7 cm nodular opacity; primary consideration is for malignancy, including primary lung cancer for one or both of these lesions.  - Mediastinal lymphadenopathy, left supraclavicular lymphadenopathy, and large left hepatic lobe mass; primary consideration is for metastatic disease.  - Moderate/large bilateral pleural effusions.  - Small pericardial effusion.    CTA 04/30/17  - Large soft tissue attenuation left paramediastinal/left upper lobe mass with obstruction/invasion of the left upper lobe bronchi concerning for malignancy versus metastatic disease.  - Pulmonary emboli in the right upper lobe segmental and subsegmental pulmonary arteries.  - Irregular consolidative opacity in the right upper lobe with associated segmental pulmonary embolus concerning for focus of infarct. Infection or malignancy additional considerations.  - Large bilateral pleural effusions.  - Large hypoattenuating lesion in the left hepatic lobe incompletely characterized on current study, concerning for mass lesion. Consider CT abdomen pelvis for further characterization.  - Filling defect in the  intrahepatic IVC concerning for tumor invasion versus thrombus.      PATHOLOGY:  Final Diagnosis 05/19/17   A: Liver, lesion, needle core biopsy   - Metastatic poorly differentiated carcinoma, consistent with adenocarcinoma of lung origin (see ancillary studies)     NOTE: PDL-1 IHC on liver biopsy still pending    STRATA NGS Liver 05/31/17  MET exon 14  RB1 p.W681  TP53 p.L194F  MSS  TMB ??? Low; Mutations per MB: 7  PD-L1 ??? High; RNA expression score: 100        ASSESSMENT AND PLAN:  75 yoF with Stage IV NSCLC-adenocarcinoma, mets to liver. She is a never smoker.    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 We are still awaiting results of IHC PDL-1 but we reviewed that even if high expression, there is no clear data regarding sequence order of a METi vs IO monotherapy. She is adamant that she is not willing to consider chemotherapy or chemoIO combination as she wants to maintain her QoL preferentially. We discussed side effects of crizotinib including fatigue, diarrhea, bradycardia/prolonged QTc, vision changes, elevated LFTs, She consented to treatment with crizotinib.  Additional counseling was given by PharmD.     Stage IV NSCLC-adenocarcinoma, MET exon 14 mutation: Advised pt to restart Crizotinib 250mg  BID as I do not believe that the medication is causing her current sx. QTc stable. Will have her return in 2-3 weeks for a tox check and repeat ECG to monitor.    Dysphagia/reflux: Discussed lifestyle modifications including sitting upright after eating. Will start pt on ranitidine BID and add simethicone prn. Will schedule for barium swallow in case her sx do not improve.     Brain Metastases: Discussed with RadOnc and the larger lesion is most c/w a meningioma and does not need intervention.  The small punctate ones are too small to treat currently and will monitor.  Given that crizotinib has lower brain penetration, will get repeat MRI brain a bit earlier at 2mos    Microcytic Anemia concerning for Thallassemia on blood smear today.  Sent Hg electrophoresis.    Supportive Care: Home Health organized and will assist with management of pleurx catheter.     Nutrition: No needs currently    Follow up: RTC in 2-3 weeks for toxicity check;. repeat ECG for HR and QTc monitoring    The patients questions were answered to her satisfaction.

## 2017-06-28 DIAGNOSIS — Z79899 Other long term (current) drug therapy: Secondary | ICD-10-CM

## 2017-06-28 DIAGNOSIS — I4891 Unspecified atrial fibrillation: Secondary | ICD-10-CM

## 2017-06-28 DIAGNOSIS — N6310 Unspecified lump in the right breast, unspecified quadrant: Secondary | ICD-10-CM

## 2017-06-28 DIAGNOSIS — D510 Vitamin B12 deficiency anemia due to intrinsic factor deficiency: Secondary | ICD-10-CM

## 2017-06-28 DIAGNOSIS — Z7901 Long term (current) use of anticoagulants: Secondary | ICD-10-CM

## 2017-06-28 DIAGNOSIS — J910 Malignant pleural effusion: Secondary | ICD-10-CM

## 2017-06-28 DIAGNOSIS — E119 Type 2 diabetes mellitus without complications: Secondary | ICD-10-CM

## 2017-06-28 DIAGNOSIS — C787 Secondary malignant neoplasm of liver and intrahepatic bile duct: Secondary | ICD-10-CM

## 2017-06-28 DIAGNOSIS — C7931 Secondary malignant neoplasm of brain: Secondary | ICD-10-CM

## 2017-06-28 DIAGNOSIS — I10 Essential (primary) hypertension: Secondary | ICD-10-CM

## 2017-06-28 DIAGNOSIS — Z4803 Encounter for change or removal of drains: Secondary | ICD-10-CM

## 2017-06-28 DIAGNOSIS — C3492 Malignant neoplasm of unspecified part of left bronchus or lung: Principal | ICD-10-CM

## 2017-06-28 DIAGNOSIS — E46 Unspecified protein-calorie malnutrition: Secondary | ICD-10-CM

## 2017-06-28 DIAGNOSIS — I2699 Other pulmonary embolism without acute cor pulmonale: Secondary | ICD-10-CM

## 2017-06-29 DIAGNOSIS — Z7901 Long term (current) use of anticoagulants: Secondary | ICD-10-CM

## 2017-06-29 DIAGNOSIS — D510 Vitamin B12 deficiency anemia due to intrinsic factor deficiency: Secondary | ICD-10-CM

## 2017-06-29 DIAGNOSIS — I10 Essential (primary) hypertension: Secondary | ICD-10-CM

## 2017-06-29 DIAGNOSIS — C7931 Secondary malignant neoplasm of brain: Secondary | ICD-10-CM

## 2017-06-29 DIAGNOSIS — E46 Unspecified protein-calorie malnutrition: Secondary | ICD-10-CM

## 2017-06-29 DIAGNOSIS — I4891 Unspecified atrial fibrillation: Secondary | ICD-10-CM

## 2017-06-29 DIAGNOSIS — E119 Type 2 diabetes mellitus without complications: Secondary | ICD-10-CM

## 2017-06-29 DIAGNOSIS — C3492 Malignant neoplasm of unspecified part of left bronchus or lung: Principal | ICD-10-CM

## 2017-06-29 DIAGNOSIS — C787 Secondary malignant neoplasm of liver and intrahepatic bile duct: Secondary | ICD-10-CM

## 2017-06-29 DIAGNOSIS — Z79899 Other long term (current) drug therapy: Secondary | ICD-10-CM

## 2017-06-29 DIAGNOSIS — I2699 Other pulmonary embolism without acute cor pulmonale: Secondary | ICD-10-CM

## 2017-06-29 DIAGNOSIS — N6310 Unspecified lump in the right breast, unspecified quadrant: Secondary | ICD-10-CM

## 2017-06-29 DIAGNOSIS — J910 Malignant pleural effusion: Secondary | ICD-10-CM

## 2017-06-29 DIAGNOSIS — Z4803 Encounter for change or removal of drains: Secondary | ICD-10-CM

## 2017-06-30 DIAGNOSIS — Z7901 Long term (current) use of anticoagulants: Secondary | ICD-10-CM

## 2017-06-30 DIAGNOSIS — N6310 Unspecified lump in the right breast, unspecified quadrant: Secondary | ICD-10-CM

## 2017-06-30 DIAGNOSIS — C3492 Malignant neoplasm of unspecified part of left bronchus or lung: Principal | ICD-10-CM

## 2017-06-30 DIAGNOSIS — D510 Vitamin B12 deficiency anemia due to intrinsic factor deficiency: Secondary | ICD-10-CM

## 2017-06-30 DIAGNOSIS — I4891 Unspecified atrial fibrillation: Secondary | ICD-10-CM

## 2017-06-30 DIAGNOSIS — E119 Type 2 diabetes mellitus without complications: Secondary | ICD-10-CM

## 2017-06-30 DIAGNOSIS — Z79899 Other long term (current) drug therapy: Secondary | ICD-10-CM

## 2017-06-30 DIAGNOSIS — I10 Essential (primary) hypertension: Secondary | ICD-10-CM

## 2017-06-30 DIAGNOSIS — I2699 Other pulmonary embolism without acute cor pulmonale: Secondary | ICD-10-CM

## 2017-06-30 DIAGNOSIS — Z4803 Encounter for change or removal of drains: Secondary | ICD-10-CM

## 2017-06-30 DIAGNOSIS — C787 Secondary malignant neoplasm of liver and intrahepatic bile duct: Secondary | ICD-10-CM

## 2017-06-30 DIAGNOSIS — E46 Unspecified protein-calorie malnutrition: Secondary | ICD-10-CM

## 2017-06-30 DIAGNOSIS — C7931 Secondary malignant neoplasm of brain: Secondary | ICD-10-CM

## 2017-06-30 DIAGNOSIS — J910 Malignant pleural effusion: Secondary | ICD-10-CM

## 2017-07-03 DIAGNOSIS — E119 Type 2 diabetes mellitus without complications: Secondary | ICD-10-CM | POA: Diagnosis not present

## 2017-07-03 DIAGNOSIS — B351 Tinea unguium: Secondary | ICD-10-CM | POA: Diagnosis not present

## 2017-07-03 DIAGNOSIS — M79674 Pain in right toe(s): Secondary | ICD-10-CM | POA: Diagnosis not present

## 2017-07-03 DIAGNOSIS — M79675 Pain in left toe(s): Secondary | ICD-10-CM | POA: Diagnosis not present

## 2017-07-03 NOTE — Unmapped (Signed)
Clinical Pharmacist Practitioner Lung Cancer Clinic: PHONE CONSULT    [name] [name] is a 84 y.o. year old with NSCLC who I am calling today for oral chemotherapy f/u.     Current cancer therapy: Crizotinib 250 mg BID    HPI: [name] [name] was seen in clinic last week and reported issues with lethargy, blood glucose and GI upset (indigestion) since starting crizotinib on 06/23/17. I am calling today for f/u regarding these complaints.    PLAN:   Recommend continue using simethicone PRN and ranitidine for GI upset. Continue crizotinib at current dose.    ASSESSMENT:   Today I spoke to [name] [name]'s son [name] [name]. He reported overall she is doing well. Since starting simethicone and ranitidine, her GI upset has resolved and her appetite has increased. Her energy level has returned and she is able to complete activities including visiting the farmer's market and cooking. He did not mention her BG and I did not inquire about BG readings. He had no other concerns at this time. We will follow up with her next week in clinic.     FOLLOW-UP: Next appointment 07/11/17    A total of 5 minutes was spent in direct patient care.     Jeannette Corpus, PharmD Candidate [phone]    I discussed the patient with the student and agree with the history and plan provided in the note. I was present and available during the phone call to [name].     Leotis Shames, PharmD, BCOP, CPP  Pager [phone]

## 2017-07-05 MED ORDER — APIXABAN 5 MG TABLET
ORAL_TABLET | Freq: Two times a day (BID) | ORAL | 0 refills | 0.00000 days | Status: CP
Start: 2017-07-05 — End: 2017-08-01

## 2017-07-05 NOTE — Unmapped (Signed)
Dr. Eliane Decree patient    Patient is requesting refill of Eliquis 5 mg.  Last visit was on 06/14/2017.  Script is ready for you approval.

## 2017-07-05 NOTE — Unmapped (Signed)
Approved 30d worth (future rfs to come from PCP) thx

## 2017-07-05 NOTE — Unmapped (Signed)
Patients son informed of medication refill being approved. Patient scheduled to see PCP on 08/01/2017. Son informed of the importance of keeping appointment.

## 2017-07-06 DIAGNOSIS — C3492 Malignant neoplasm of unspecified part of left bronchus or lung: Principal | ICD-10-CM

## 2017-07-06 DIAGNOSIS — Z4803 Encounter for change or removal of drains: Secondary | ICD-10-CM

## 2017-07-06 DIAGNOSIS — E119 Type 2 diabetes mellitus without complications: Secondary | ICD-10-CM

## 2017-07-06 DIAGNOSIS — C7931 Secondary malignant neoplasm of brain: Secondary | ICD-10-CM

## 2017-07-06 DIAGNOSIS — I4891 Unspecified atrial fibrillation: Secondary | ICD-10-CM

## 2017-07-06 DIAGNOSIS — Z79899 Other long term (current) drug therapy: Secondary | ICD-10-CM

## 2017-07-06 DIAGNOSIS — I10 Essential (primary) hypertension: Secondary | ICD-10-CM

## 2017-07-06 DIAGNOSIS — J910 Malignant pleural effusion: Secondary | ICD-10-CM

## 2017-07-06 DIAGNOSIS — I2699 Other pulmonary embolism without acute cor pulmonale: Secondary | ICD-10-CM

## 2017-07-06 DIAGNOSIS — D510 Vitamin B12 deficiency anemia due to intrinsic factor deficiency: Secondary | ICD-10-CM

## 2017-07-06 DIAGNOSIS — E46 Unspecified protein-calorie malnutrition: Secondary | ICD-10-CM

## 2017-07-06 DIAGNOSIS — Z7901 Long term (current) use of anticoagulants: Secondary | ICD-10-CM

## 2017-07-06 DIAGNOSIS — C787 Secondary malignant neoplasm of liver and intrahepatic bile duct: Secondary | ICD-10-CM

## 2017-07-06 DIAGNOSIS — N6310 Unspecified lump in the right breast, unspecified quadrant: Secondary | ICD-10-CM

## 2017-07-10 NOTE — Unmapped (Signed)
Patient AWV scheduled for 09/08/17 with Naaman Plummer, LCSW-A  Patient contact did not originate through [facility]front.

## 2017-07-10 NOTE — Unmapped (Signed)
Reason for visit: [name] [name] is seen at the request of Dr. Curt Bears  for a thoracic medical oncology opinion regarding new diagnosis of Stage IV NSCLC.      HPI:  [name] [name] is an 84 yo F, never smoker, who initially became symptomatic  with dyspnea and cough in late Feb 2019.   History as follows:  Oncology History    - 04/30/17: Presented to ED with dyspnea for over 2 weeks.  CTA with PEs, b/l pleural effusions, mediastinal soft tissue mass 9/5/7cm, invading LUL nd 8.2cm lesion inleft hepatic lobe  - 05/17/17: b/l thoracentesis with 2L removed each lung.  Cytology with no malignant cells but lymphocyte predominant exudative effusiions  - 05/18/17: Biopsy liver lesion c/w lung adenocarcinoma        She lives with her her 2 teenage grandsons and has attentive sons/daughter close by.  Her daughter-in-law has been sleeping overnight with her since diagnosis.  She dresses herself, showers, able to do ADLs but family does most of cooking, grocery shopping and cleaning.  Her son manages her medicines and finances. She is otherwise feeling reasonable well.  Has some fatigue. No falls. Does not use a walker or cane.  She is able to walk around [place] but after exertion like this, has some chest pressure and has to sit down to rest, with resolution usually by .  No stairs at home.  She has insulin dependent DM, HTN and Afib, controlled with meds.      Interval Hx  Initiated crizotinib on 06/23/17. She has improved in the interim - eating better, getting out and about to the store again. Her BGs have been higher, ranging 200-300 in the mornings now that she is eating better - her insulin dose had been adjusted down previously when her appetite was poor. GERD and bloating sx have resolved.  Pleurx catheters draining and . No f/c, cough.    ROS:  10 systems were reviewed and found to be within normal limits except as described in the HPI.    MEDICATIONS:  Current Outpatient Medications on File Prior to Visit   Medication Sig Dispense Refill   ??? amLODIPine (NORVASC) 10 MG tablet Take 1 tablet by mouth.     ??? apixaban (ELIQUIS) 5 mg Tab Take 1 tablet (5 mg total) by mouth Two (2) times a day. 2 tabs (10 mg) twice a day for 7 days, then 1 tab (5 mg) twice a day 60 tablet 0   ??? blood sugar diagnostic (ACCU-CHEK AVIVA) Strp      ??? crizotinib (XALKORI) 250 mg capsule Take 1 capsule (250 mg total) by mouth Two (2) times a day. 60 capsule 3   ??? flash glucose scanning reader 1 each.     ??? flash glucose sensor kit 3 each.     ??? furosemide (LASIX) 20 MG tablet TAKE 1 TABLET BY MOUTH EVERY DAY 30 tablet 0   ??? insulin aspart protamine-insulin aspart (NOVOLOG MIX 70-30FLEXPEN U-100) 100 unit/mL (70-30) injection pen TAKE 10 UNITS 30 MINUTES BEFORE BREAKFAST AND 8 UNITS 30 MINUTES BEFORE SUPPER.     ??? losartan (COZAAR) 100 MG tablet Take 1 tablet by mouth.     ??? pen needle, diabetic (BD ULTRA-FINE SHORT PEN NEEDLE) 31 gauge x 5/16 Ndle USE AS DIRECTED. USE 4 NEEDLES A DAY WITH DIABETIC MEDICATION PENS AS DIRECTED     ??? potassium chloride SA (K-DUR,KLOR-CON) 20 MEQ tablet Take 1 tablet (20 mEq total) by mouth  daily. 30 tablet 3   ??? ranitidine (ZANTAC) 150 MG tablet Take 1 tablet (150 mg total) by mouth Two (2) times a day. 60 tablet 1   ??? simethicone (GAS-X) 80 MG chewable tablet Chew 1 tablet (80 mg total) 4 (four) times a day as needed for flatulence (gas relief). 100 tablet 2     No current facility-administered medications on file prior to visit.        ALLERGIES:   Allergies   Allergen Reactions   ??? Sulfasalazine Nausea Only       PMH:  Past Medical History:   Diagnosis Date   ??? Atrial fibrillation (CMS-HCC)    ??? Diabetes mellitus (CMS-HCC)    ??? Hypertension        SOCIAL HISTORY:  Lives with her 2 teenage grandsons. Her sons, daughter live nearby.    Never smoker  No alcohol    FAMILY HISTORY:  No hx of cancer in her parents.  Daughter has breast cancer.  Another daughter died of lung cancer and was a heavy smoker for many years.     PHYSICAL EXAMINATION:  BP 107/55  - Pulse 76  - Temp 36.8 ??C (98.2 ??F) (Oral)  - Resp 18  - Ht 162.6 cm (5' 4)  - Wt 55 kg (121 lb 4.8 oz)  - SpO2 100%  - BMI 20.82 kg/m??     ECOG 1  GENERAL: Thin, pleasant woman not in acute distress  MOUTH: Moist and clear without thrush  NECK: Supple  LYMPH: No cervical or supraclavicular lymphadenopathy  CHEST: CTAB  CV: Nl s1/s2  ABD: Soft, not tender, not distended, no palpable organomegaly  EXTREMITIES: Warm and well perfused x 4 without cyanosis, clubbing or edema  MUSKOLOSKELETAL: No spinal tenderness to palpation  NEURO: CN2-12 intact.  Speech and gait within normal limits  AFFECT: Varied and appropriate to situation  DERM: No lesions noted    LABS:    RADIOLOGY:  MRI Brain 05/30/17  --Multiple small subacute acute/subacute right cerebral infarcts.  --Numerous small enhancing lesions in the right cerebral hemisphere, some of which are concerning for metastases, while some are more consistent with subacute infarct.  --Left frontal calvarial metastasis with epidural/dural extension.  --Small dural based lesion at the left paramedian vertex, meningioma or venous structure favored over metastasis.    CT-C 05/11/17  - Left upper lobe 10 cm mass and right upper lobe 2.7 cm nodular opacity; primary consideration is for malignancy, including primary lung cancer for one or both of these lesions.  - Mediastinal lymphadenopathy, left supraclavicular lymphadenopathy, and large left hepatic lobe mass; primary consideration is for metastatic disease.  - Moderate/large bilateral pleural effusions.  - Small pericardial effusion.    CTA 04/30/17  - Large soft tissue attenuation left paramediastinal/left upper lobe mass with obstruction/invasion of the left upper lobe bronchi concerning for malignancy versus metastatic disease.  - Pulmonary emboli in the right upper lobe segmental and subsegmental pulmonary arteries.  - Irregular consolidative opacity in the right upper lobe with associated segmental pulmonary embolus concerning for focus of infarct. Infection or malignancy additional considerations.  - Large bilateral pleural effusions.  - Large hypoattenuating lesion in the left hepatic lobe incompletely characterized on current study, concerning for mass lesion. Consider CT abdomen pelvis for further characterization.  - Filling defect in the intrahepatic IVC concerning for tumor invasion versus thrombus.      PATHOLOGY:  Final Diagnosis 05/19/17   A: Liver, lesion, needle core biopsy   -  Metastatic poorly differentiated carcinoma, consistent with adenocarcinoma of lung origin (see ancillary studies)     NOTE: PDL-1 IHC on liver biopsy still pending    STRATA NGS Liver 05/31/17  MET exon 14  RB1 p.W681  TP53 p.L194F  MSS  TMB ??? Low; Mutations per MB: 7  PD-L1 ??? High; RNA expression score: 100        ASSESSMENT AND PLAN:  82 yoF with Stage IV NSCLC-adenocarcinoma, mets to liver. She is a never smoker.    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 We are still awaiting results of IHC PDL-1 but we reviewed that even if high expression, there is no clear data regarding sequence order of a METi vs IO monotherapy. She is adamant that she is not willing to consider chemotherapy or chemoIO combination as she wants to maintain her QoL preferentially. 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  Tolerating it well.  QTc today 497    Brain Metastases: Discussed with RadOnc and the larger lesion is most c/w a meningioma and does not need intervention.  The small punctate ones are too small to treat currently and will monitor.  Given that crizotinib has lower brain penetration, will get repeat MRI brain a bit earlier at 2mos (due 08/08/17)    Dysphagia/reflux: Resolved and eating well again.    Hyperglycemia:  BG 435 in clinic today, she took her 70/30 insulin at 7:30am.  Gave 5U short acting lispro in the office. She will follow up with her PCP locally to adjust insulin dose now that she is consistently eating more.    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  Recommended folic acid daily. Will refer to Benign Hematology if anemia worsens.     Supportive Care: Home Health organized and will assist with management of pleurx catheter    Nutrition: No needs currently    Follow up: RTC in 4 weeks for toxicity check and MRI and CT-C prior;. repeat ECG for HR and QTc monitoring    The patients questions were answered to her satisfaction.     Pt. seen and discussed with Dr. Seward Speck MD  Hematology/Oncology Fellow

## 2017-07-11 ENCOUNTER — Ambulatory Visit: Admit: 2017-07-11 | Discharge: 2017-07-11 | Payer: MEDICARE | Attending: Pulmonary Disease | Primary: Pulmonary Disease

## 2017-07-11 ENCOUNTER — Ambulatory Visit: Admit: 2017-07-11 | Discharge: 2017-07-11 | Payer: MEDICARE

## 2017-07-11 ENCOUNTER — Ambulatory Visit
Admit: 2017-07-11 | Discharge: 2017-07-11 | Payer: MEDICARE | Attending: Hematology & Oncology | Primary: Hematology & Oncology

## 2017-07-11 DIAGNOSIS — C3490 Malignant neoplasm of unspecified part of unspecified bronchus or lung: Principal | ICD-10-CM

## 2017-07-11 DIAGNOSIS — J90 Pleural effusion, not elsewhere classified: Principal | ICD-10-CM

## 2017-07-11 DIAGNOSIS — D582 Other hemoglobinopathies: Principal | ICD-10-CM

## 2017-07-11 DIAGNOSIS — Z794 Long term (current) use of insulin: Secondary | ICD-10-CM | POA: Diagnosis not present

## 2017-07-11 DIAGNOSIS — I2699 Other pulmonary embolism without acute cor pulmonale: Secondary | ICD-10-CM | POA: Diagnosis not present

## 2017-07-11 DIAGNOSIS — Z7901 Long term (current) use of anticoagulants: Secondary | ICD-10-CM | POA: Diagnosis not present

## 2017-07-11 DIAGNOSIS — I4891 Unspecified atrial fibrillation: Secondary | ICD-10-CM | POA: Diagnosis not present

## 2017-07-11 DIAGNOSIS — Z90710 Acquired absence of both cervix and uterus: Secondary | ICD-10-CM | POA: Diagnosis not present

## 2017-07-11 DIAGNOSIS — Z95828 Presence of other vascular implants and grafts: Secondary | ICD-10-CM | POA: Diagnosis not present

## 2017-07-11 DIAGNOSIS — I10 Essential (primary) hypertension: Secondary | ICD-10-CM | POA: Diagnosis not present

## 2017-07-11 DIAGNOSIS — Z79899 Other long term (current) drug therapy: Secondary | ICD-10-CM | POA: Diagnosis not present

## 2017-07-11 DIAGNOSIS — E119 Type 2 diabetes mellitus without complications: Secondary | ICD-10-CM | POA: Diagnosis not present

## 2017-07-11 DIAGNOSIS — Z9049 Acquired absence of other specified parts of digestive tract: Secondary | ICD-10-CM | POA: Diagnosis not present

## 2017-07-11 LAB — COMPREHENSIVE METABOLIC PANEL
ALBUMIN: 2.7 g/dL — ABNORMAL LOW (ref 3.5–5.0)
ALT (SGPT): 34 U/L (ref 15–48)
ANION GAP: 6 mmol/L — ABNORMAL LOW (ref 9–15)
AST (SGOT): 16 U/L (ref 14–38)
BILIRUBIN TOTAL: 0.7 mg/dL (ref 0.0–1.2)
BLOOD UREA NITROGEN: 20 mg/dL (ref 7–21)
BUN / CREAT RATIO: 26
CALCIUM: 8.8 mg/dL (ref 8.5–10.2)
CHLORIDE: 99 mmol/L (ref 98–107)
CO2: 27 mmol/L (ref 22.0–30.0)
CREATININE: 0.76 mg/dL (ref 0.60–1.00)
EGFR MDRD AF AMER: >=60 mL/min/{1.73_m2} (ref >=60)
EGFR MDRD NON AF AMER: >=60 mL/min/{1.73_m2} (ref >=60)
GLUCOSE RANDOM: 435 mg/dL (ref 65–179)
POTASSIUM: 4.8 mmol/L (ref 3.5–5.0)
SODIUM: 132 mmol/L — ABNORMAL LOW (ref 135–145)

## 2017-07-11 LAB — CBC W/ AUTO DIFF
BASOPHILS ABSOLUTE COUNT: 0 10*9/L (ref 0.0–0.1)
BASOPHILS RELATIVE PERCENT: 0.4 %
EOSINOPHILS ABSOLUTE COUNT: 0.1 10*9/L (ref 0.0–0.4)
EOSINOPHILS RELATIVE PERCENT: 2.3 %
HEMATOCRIT: 30 % — ABNORMAL LOW (ref 36.0–46.0)
HEMOGLOBIN: 11.2 g/dL — ABNORMAL LOW (ref 12.0–16.0)
LARGE UNSTAINED CELLS: 3 % (ref 0–4)
LYMPHOCYTES ABSOLUTE COUNT: 0.9 10*9/L — ABNORMAL LOW (ref 1.5–5.0)
LYMPHOCYTES RELATIVE PERCENT: 15.3 %
MEAN CORPUSCULAR HEMOGLOBIN CONC: 37.4 g/dL — ABNORMAL HIGH (ref 31.0–37.0)
MEAN CORPUSCULAR HEMOGLOBIN: 23.2 pg — ABNORMAL LOW (ref 26.0–34.0)
MEAN PLATELET VOLUME: 7.5 fL (ref 7.0–10.0)
MONOCYTES ABSOLUTE COUNT: 0.3 10*9/L (ref 0.2–0.8)
MONOCYTES RELATIVE PERCENT: 4.4 %
NEUTROPHILS ABSOLUTE COUNT: 4.5 10*9/L (ref 2.0–7.5)
NEUTROPHILS RELATIVE PERCENT: 74.4 %
RED BLOOD CELL COUNT: 4.85 10*12/L (ref 4.00–5.20)
RED CELL DISTRIBUTION WIDTH: 19.2 % — ABNORMAL HIGH (ref 12.0–15.0)
WBC ADJUSTED: 6.1 10*9/L (ref 4.5–11.0)

## 2017-07-11 LAB — LARGE UNSTAINED CELLS: Lab: 3

## 2017-07-11 LAB — ALBUMIN: Albumin:MCnc:Pt:Ser/Plas:Qn:: 2.7 — ABNORMAL LOW

## 2017-07-11 LAB — HAPTOGLOBIN: Haptoglobin:MCnc:Pt:Ser/Plas:Qn:: 93

## 2017-07-11 LAB — SLIDE REVIEW

## 2017-07-11 LAB — LACTATE DEHYDROGENASE: Lactate dehydrogenase:CCnc:Pt:Ser/Plas:Qn:: 455

## 2017-07-11 LAB — SMEAR REVIEW

## 2017-07-11 LAB — BILIRUBIN DIRECT: Bilirubin.glucuronidated:MCnc:Pt:Ser/Plas:Qn:: 0.2

## 2017-07-11 NOTE — Unmapped (Signed)
Hi,     Patient's son, [name] contacted the Communication Center regarding the following:    Stated that he received a message saying that the patient pulmonology appointment was rescheduled to 10:30am instead of 3:20pm.   He wants to know if the appointment was switched or not.    Please feel free to contact [place] at [phone].    Thanks in advance,    Jodi Mourning  [facility]   [phone]

## 2017-07-11 NOTE — Unmapped (Signed)
You were seen today for follow up of your PleurX catheters.    Continue to drain every other day. If the drainage decreases to less than 75 mL for 3 times in a row, call us and we will discuss when to hold your blood thinners so we can remove the catheter.    Thank you for allowing me to participate in your care. Please do not hesitate to contact me with issues or questions.     Tiana Loft MD  Evalyn Casco, MD  Mercy Moore, MD    Interventional Pulmonary Fellow: Maxwell Caul, MD    Pulmonary and Critical Care Fellow: Catalina Gravel, DO    During business hours please contact me via Tammy Allred at [phone] or page her at [phone].    For urgent issues after hours please call [phone] [facility] Operator) and ask to speak with the Adult Pulmonary Fellow on call.

## 2017-07-11 NOTE — Unmapped (Signed)
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    [name] [name] has bilateral PleurX catheters and is improving functionally. Her drainage is decreasing so we asked them to call if the drainage is less than 75 mL for three consecutive times. She will otherwise follow up in one month. Of note, the left PleurX catheter has been pulled to the point where the cuff is barely visible at the incision site. We advised they call if the cuff becomes more visible and we would discuss holding anticoagulation for 48 hours then removing the catheter.     Plan:     1. Follow up in one month.    2. Monitor PleurX catheter drainage and call if drainage decreases to less than 75 mL for three consecutive drainages.    3. Monitor placement of cuff on the left side.    Patient seen and discussed with Dr. Salli Real.    Signora Zucco R. 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 She started treatment with crizotinib on 06/23/17.    She was first seen as an inpatient and had pigtail chest tubes placed for bilateral pleural effusons (chest tubes placed rather than thoracentesis due to patient being on systemic anticoagulation for PE). Pleural effusions decreased in size so pigtail catheters were removed and she was discharged home. She had reaccumulation of pleural fluid with symptoms so underwent bilatearl thoracentesis on 06/03/17 with opposite side sampled on 06/04/17. She had reaccumulation of her right malignant effusion and left paramalignant effusion so underwent bilateral PleurX catheter placement on 06/19/17.     Ms. Kassa is draining her PleurX catheters every other day and the output is decreasing. The right side drained 300 and the left side drained 150 mL. They have some questions about the catheter on the left side. She feels better after draining and has relief of her shortness of breath.  She has increased energy and has started cooking again and her appetite is betetr. She has gained 8 pounds since her last appointment.     They state the left catheter was pulled out about half a centimeter yesterday when [name] [name] was removing her shirt. The cuff is visible. She denies pain, redness, drainage, or bleeding at either catheter site and they are both draining well.     Review of Systems  A 12 point review of systems was negative except for pertinent items noted in the HPI.    Past Medical History:   Diagnosis Date   ??? Atrial fibrillation (CMS-HCC)    ??? Diabetes mellitus (CMS-HCC)    ??? Hypertension        Past Surgical History:   Procedure Laterality Date   ??? CHG RAD GUIDED,PERCUT DRAINAGE,W/CATH PLACE Right 06/19/2017    Procedure: RADIOLOGICAL GUIDANCE, FOR PERCUTANEOUS DRAINAGE, WITH  PLACEMENT OF CATHETER, RAD S&I;  Surgeon: Sherwood Gambler, MD;  Location: [facility] PROCEDURE LAB [facility];  Service: Pulmonary   ??? CHOLECYSTECTOMY     ??? HYSTERECTOMY     ??? neck surgey     ??? PR INSERTION INDWELLING TUNNELED PLEURAL CATHETER Bilateral 06/19/2017    Procedure: INSERTION OF INDWELLING TUNNELED PLEURAL CATHETER WITH CUFF WITH MODERATE SEDATION;  Surgeon: Sherwood Gambler, MD;  Location: [facility] PROCED[facility] LAB [facility];  Service: Pulmonary   ??? PR MOD SED SAME PHYS/QHP EACH ADDL 15 MINS  06/19/2017    Procedure: MODERATE SEDATION SERVICES PROVIDED BY SAME PHYSICIAN OR OTHER QUALIFIED HEALTH CARE PROFESSIONAL PERFORMING THE DIAGNOSTIC OR THERAPEUTIC SERVICE THAT SEDATION SUPPORTS, EACH ADDITIONAL 15 MINS;  Surgeon: Sherwood Gambler, MD;  Location: [facility]ON[facility] PROCEDURE LAB [facility];  Service: Pulmonary   ??? PR MOD SED SAME PHYS/QHP INITIAL 15 MINS 5/> YRS  06/19/2017    Procedure: MODERATE SEDATION SERVICES PROVIDED BY SAME PHYSICIAN OR OTHER QUALIFIED HC PROFESSIONAL PERFORMING THE DIAGNOSTIC OR THERAPEUTIC SERVICE THAT SEDATION SUPPORTS, INIT 15 MINS, PT AGE 44 YEARS OR OLDER;  Surgeon: Sherwood Gambler, MD;  Location: [facility] PROCEDURE LAB [facility];  Service: Pulmonary       Current Outpatient Medications   Medication Sig Dispense Refill   ??? amLODIPine (NORVASC) 10 MG tablet Take 1 tablet by mouth.     ??? apixaban (ELIQUIS) 5 mg Tab Take 1 tablet (5 mg total) by mouth Two (2) times a day. 2 tabs (10 mg) twice a day for 7 days, then 1 tab (5 mg) twice a day 60 tablet 0   ??? blood sugar diagnostic (ACCU-CHEK AVIVA) Strp      ??? crizotinib (XALKORI) 250 mg capsule Take 1 capsule (250 mg total) by mouth Two (2) times a day. 60 capsule 3   ??? flash glucose scanning reader 1 each.     ??? flash glucose sensor kit 3 each.     ??? furosemide (LASIX) 20 MG tablet TAKE 1 TABLET BY MOUTH EVERY DAY 30 tablet 0   ??? insulin aspart protamine-insulin aspart (NOVOLOG MIX 70-30FLEXPEN U-100) 100 unit/mL (70-30) injection pen TAKE 10 UNITS 30 MINUTES BEFORE BREAKFAST AND 8 UNITS 30 MINUTES BEFORE SUPPER.     ??? losartan (COZAAR) 100 MG tablet Take 1 tablet by mouth.     ??? pen needle, diabetic (BD ULTRA-FINE SHORT PEN NEEDLE) 31 gauge x 5/16 Ndle USE AS DIRECTED. USE 4 NEEDLES A DAY WITH DIABETIC MEDICATION PENS AS DIRECTED     ??? potassium chloride SA (K-DUR,KLOR-CON) 20 MEQ tablet Take 1 tablet (20 mEq total) by mouth daily. 30 tablet 3   ??? ranitidine (ZANTAC) 150 MG tablet Take 1 tablet (150 mg total) by mouth Two (2) times a day. 60 tablet 1   ??? simethicone (GAS-X) 80 MG chewable tablet Chew 1 tablet (80 mg total) 4 (four) times a day as needed for flatulence (gas relief). 100 tablet 2     No current facility-administered medications for this visit.        Allergies as of 07/11/2017 - Reviewed 07/11/2017   Allergen Reaction Noted   ??? Sulfasalazine Nausea Only 12/22/2014       Family History   Problem Relation Age of Onset   ??? Cancer Brother         lung   ??? Cancer Daughter         lung   ??? Cancer Daughter         breast  Social History     Socioeconomic History   ??? Marital status: Widowed     Spouse name: Not on file   ??? Number of children: Not on file   ??? Years of education: Not on file   ??? Highest education level: Not on file   Occupational History   ??? Not on file   Social Needs   ??? Financial resource strain: Not on file   ??? Food insecurity:     Worry: Not on file     Inability: Not on file   ??? Transportation needs:     Medical: Not on file     Non-medical: Not on file   Tobacco Use   ??? Smoking status: Never Smoker   ??? Smokeless tobacco: Never Used   Substance and Sexual Activity   ??? Alcohol use: No   ??? Drug use: Never   ??? Sexual activity: Not on file   Lifestyle   ??? Physical activity:     Days per week: Not on file     Minutes per session: Not on file   ??? Stress: Not on file   Relationships   ??? Social connections:     Talks on phone: Not on file     Gets together: Not on file     Attends religious service: Not on file     Active member of club or organization: Not on file     Attends meetings of clubs or organizations: Not on file     Relationship status: Not on file   Other Topics Concern   ??? Do you use sunscreen? No   ??? Tanning bed use? No   ??? Are you easily burned? No   ??? Excessive sun exposure? No   ??? Blistering sunburns? No   Social History Narrative   ??? Not on file       Objective:     There were no vitals filed for this visit.  General: Alert, well-appearing, and in no distress.  Eyes: Anicteric sclera, conjunctiva clear.  ENT:  Nares normal, septum midline, mucosa normal, no drainage or sinus tenderness.  Lymph: No cervical or supraclavicular adenopathy.  Lungs: Normal excursion, no dullness to percussion. Good air movement bilaterally, without wheezes or crackles. Normal upper airway sounds without evidence of stridor.  Cardiovascular: Regular rate and rhythm, S1, S2 normal, no murmur, click, rub or gallop appreciated.  Abdomen: bowel sounds are normal, liver is not enlarged, spleen is not enlarged  Musculoskeletal: No clubbing and no synovitis.  Skin: No rashes or lesions.  Neuro: No focal neurological deficits.    Left PleurX catheter dressing removed and the end of the cuff is visible at the incision site. Anchor suture is in place and there is no erythema or drainage noted. Catheter was re-dressed per the usual sterile procedures.    Diagnostic Review:

## 2017-07-11 NOTE — Unmapped (Addendum)
It was a pleasure to see you in clinic today.    - Continue with crizotinib twice a day  - We discussed that your labs show that you have Hemoglobin C disease which affects your hemoglobin levels.  These levels have been stable and this usually does not require treatment.  But take folic acid 1mg  tablet daily to help with replenishing red cells.  - Please get CT scan of your chest and MRI of your brain before your next visit  - We will see you back in clinic in a month, with labs and scans day of or a few days before the visit. However, please call if you have any new symptoms or concerns and we will see you sooner.  - Follow up with your PCP in the next week to adjust your insulin dose to better match your food intake      Please call our nurse navigator if you have any interval questions or concerns:  Nurse Navigator: Leisa Lenz, RN:  [email or web address]  Nurse Triage Line: [phone]  ??  For emergencies, evenings or weekends, please call [phone] and ask for oncology fellow on call.  ??  For appointments, please call [phone].  For [facility] pharmacy, please call [phone].

## 2017-07-11 NOTE — Unmapped (Signed)
Patient already arrived for appointment.

## 2017-07-12 DIAGNOSIS — C3492 Malignant neoplasm of unspecified part of left bronchus or lung: Principal | ICD-10-CM

## 2017-07-12 DIAGNOSIS — Z7901 Long term (current) use of anticoagulants: Secondary | ICD-10-CM

## 2017-07-12 DIAGNOSIS — E46 Unspecified protein-calorie malnutrition: Secondary | ICD-10-CM

## 2017-07-12 DIAGNOSIS — I10 Essential (primary) hypertension: Secondary | ICD-10-CM

## 2017-07-12 DIAGNOSIS — I2699 Other pulmonary embolism without acute cor pulmonale: Secondary | ICD-10-CM

## 2017-07-12 DIAGNOSIS — C787 Secondary malignant neoplasm of liver and intrahepatic bile duct: Secondary | ICD-10-CM

## 2017-07-12 DIAGNOSIS — D510 Vitamin B12 deficiency anemia due to intrinsic factor deficiency: Secondary | ICD-10-CM

## 2017-07-12 DIAGNOSIS — N6310 Unspecified lump in the right breast, unspecified quadrant: Secondary | ICD-10-CM

## 2017-07-12 DIAGNOSIS — I4891 Unspecified atrial fibrillation: Secondary | ICD-10-CM

## 2017-07-12 DIAGNOSIS — J910 Malignant pleural effusion: Secondary | ICD-10-CM

## 2017-07-12 DIAGNOSIS — E119 Type 2 diabetes mellitus without complications: Secondary | ICD-10-CM

## 2017-07-12 DIAGNOSIS — Z4803 Encounter for change or removal of drains: Secondary | ICD-10-CM

## 2017-07-12 DIAGNOSIS — Z79899 Other long term (current) drug therapy: Secondary | ICD-10-CM

## 2017-07-12 DIAGNOSIS — C7931 Secondary malignant neoplasm of brain: Secondary | ICD-10-CM

## 2017-07-14 NOTE — Unmapped (Signed)
[facility] Refill Coordination Note    Specialty Medication(s) to be Shipped:   XALKORI 250MG  CAPSULE    Other medication(s) to be shipped:       Bevely Palmer, DOB: [birth year]  Phone: [phone] (home)   Shipping Address: [address]  [place] [address]    All above HIPAA information was verified with patient.     Completed refill call assessment today to schedule patient's medication shipment from the [facility] [id number]).       Specialty medication(s) and dose(s) confirmed: Regimen is correct and unchanged.   Changes to medications: Peggyann reports starting the following medications: FOLIC ACID  Changes to insurance: No  Questions for the pharmacist: No    The patient will receive an [facility] print out for each medication shipped and additional FDA Medication Guides as required.  Patient education from [place] or Robet Leu may also be included in the shipment.    DISEASE-SPECIFIC INFORMATION        N/A    ADHERENCE              MEDICARE PART B DOCUMENTATION         SHIPPING     Shipping address confirmed in [facility]SI.     Delivery Scheduled: Yes, Expected medication delivery date: 052219 via UPS or courier.     Antonietta Barcelona   [facility] Specialty Technician

## 2017-07-15 ENCOUNTER — Ambulatory Visit: Admit: 2017-07-15 | Discharge: 2017-07-15 | Disposition: A | Discharge status: Home / Self Care | Payer: MEDICARE

## 2017-07-15 DIAGNOSIS — I4891 Unspecified atrial fibrillation: Secondary | ICD-10-CM | POA: Diagnosis not present

## 2017-07-15 DIAGNOSIS — Z79899 Other long term (current) drug therapy: Secondary | ICD-10-CM | POA: Diagnosis not present

## 2017-07-15 DIAGNOSIS — K209 Esophagitis, unspecified: Secondary | ICD-10-CM | POA: Diagnosis not present

## 2017-07-15 DIAGNOSIS — E119 Type 2 diabetes mellitus without complications: Secondary | ICD-10-CM | POA: Diagnosis not present

## 2017-07-15 DIAGNOSIS — Z7901 Long term (current) use of anticoagulants: Secondary | ICD-10-CM | POA: Diagnosis not present

## 2017-07-15 DIAGNOSIS — Z9221 Personal history of antineoplastic chemotherapy: Secondary | ICD-10-CM | POA: Diagnosis not present

## 2017-07-15 DIAGNOSIS — I10 Essential (primary) hypertension: Secondary | ICD-10-CM | POA: Diagnosis not present

## 2017-07-15 DIAGNOSIS — R1310 Dysphagia, unspecified: Secondary | ICD-10-CM | POA: Diagnosis not present

## 2017-07-15 DIAGNOSIS — Z794 Long term (current) use of insulin: Secondary | ICD-10-CM | POA: Diagnosis not present

## 2017-07-15 DIAGNOSIS — C3490 Malignant neoplasm of unspecified part of unspecified bronchus or lung: Secondary | ICD-10-CM | POA: Diagnosis not present

## 2017-07-15 LAB — CBC W/ AUTO DIFF
BASOPHILS ABSOLUTE COUNT: 0 10*9/L (ref 0.0–0.1)
BASOPHILS RELATIVE PERCENT: 0.2 %
EOSINOPHILS ABSOLUTE COUNT: 0.1 10*9/L (ref 0.0–0.4)
EOSINOPHILS RELATIVE PERCENT: 1.3 %
HEMATOCRIT: 27.2 % — ABNORMAL LOW (ref 36.0–46.0)
HEMOGLOBIN: 9.9 g/dL — ABNORMAL LOW (ref 13.5–16.0)
LYMPHOCYTES RELATIVE PERCENT: 13.7 %
MEAN CORPUSCULAR HEMOGLOBIN CONC: 36.5 g/dL (ref 31.0–37.0)
MEAN CORPUSCULAR HEMOGLOBIN: 22.6 pg — ABNORMAL LOW (ref 26.0–34.0)
MEAN CORPUSCULAR VOLUME: 61.9 fL — ABNORMAL LOW (ref 80.0–100.0)
MEAN PLATELET VOLUME: 7.4 fL (ref 7.0–10.0)
MONOCYTES ABSOLUTE COUNT: 0.4 10*9/L (ref 0.2–0.8)
MONOCYTES RELATIVE PERCENT: 5.3 %
NEUTROPHILS ABSOLUTE COUNT: 5.5 10*9/L (ref 2.0–7.5)
NEUTROPHILS RELATIVE PERCENT: 77.8 %
RED BLOOD CELL COUNT: 4.39 10*12/L (ref 4.00–5.20)
RED CELL DISTRIBUTION WIDTH: 20.4 % — ABNORMAL HIGH (ref 12.0–15.0)
WBC ADJUSTED: 7.1 10*9/L (ref 4.5–11.0)

## 2017-07-15 LAB — ANION GAP: Anion gap 3:SCnc:Pt:Ser/Plas:Qn:: 2 — ABNORMAL LOW

## 2017-07-15 LAB — BASIC METABOLIC PANEL
ANION GAP: 2 mmol/L — ABNORMAL LOW (ref 9–15)
BLOOD UREA NITROGEN: 16 mg/dL (ref 7–21)
BUN / CREAT RATIO: 24
CALCIUM: 8.2 mg/dL — ABNORMAL LOW (ref 8.5–10.2)
CHLORIDE: 102 mmol/L (ref 98–107)
CO2: 31 mmol/L — ABNORMAL HIGH (ref 22.0–30.0)
CREATININE: 0.66 mg/dL (ref 0.60–1.00)
EGFR MDRD AF AMER: >=60 mL/min/{1.73_m2} (ref >=60)
EGFR MDRD NON AF AMER: >=60 mL/min/{1.73_m2} (ref >=60)
GLUCOSE RANDOM: 307 mg/dL — ABNORMAL HIGH (ref 65–179)
SODIUM: 135 mmol/L (ref 135–145)

## 2017-07-15 LAB — SLIDE REVIEW

## 2017-07-15 LAB — SMEAR REVIEW

## 2017-07-15 LAB — NEUTROPHILS ABSOLUTE COUNT: Lab: 5.5

## 2017-07-15 MED ORDER — NYSTATIN 100,000 UNIT/ML ORAL SUSPENSION
Freq: Four times a day (QID) | ORAL | 0 refills | 0.00000 days | Status: CP
Start: 2017-07-15 — End: 2017-08-08

## 2017-07-15 MED ORDER — LIDOCAINE HCL 2 % MUCOSAL SOLUTION
OROMUCOSAL | 0 refills | 0.00000 days | Status: CP | PRN
Start: 2017-07-15 — End: 2017-08-01

## 2017-07-15 NOTE — Unmapped (Addendum)
Pt presents with difficulty swallowing since yesterday. Pt states she has the sensation that her food is not passing when she tries to swallow and pain with swallowing. Pt has recently been started on folic acid and family is concerned this may be contributing to her inability to swallow. Pt denies any SOB or CP.

## 2017-07-16 NOTE — Unmapped (Signed)
[name] Emergency Department Provider Note        CHIEF COMPLAINT: Difficulty Swallowing    HPI: [name] [name] is a 84 y.o. female with a history of A-fib, lung cancer on chemo, HTN, and DM who reports to the ED for difficulty swallowing.  [name] [name] reports an onset of difficulty swallowing with associated pain with swallowing yesterday.  Son further reports that she was able to eat breakfast today without any symptoms but then had difficulty with fluid and PO intake midday today due to her pain.  She has been able to tolerate small sips of water since then but reports if she took a large sip it would come back up.  Son reports that she had similar symptoms one month ago which was alleviated with antacid medication recommended by her Oncologist.  [name] [name] has taken her antacid this morning.  Denies history of yeast infection.  She is taking Xalcori for her lung cancer.  Denies shortness of breath or chest pain.         ROS: See HPI  Constitutional: no fever   Eyes: no new visual changes  ENT: positive for difficulty swallowing. no trouble breathing.  Cardiovascular:  no chest pain   Resp: no SOB   GI: no vomiting or dh  GU: no dysuria  Integumentary: no rash   Allergy: no hives   Musculoskeletal: no leg swelling   Neurological: no slurred speech  ROS otherwise negative except as in HPI    PAST MEDICAL HISTORY/PAST SURGICAL HISTORY:   Past Medical History:   Diagnosis Date   ??? Atrial fibrillation (CMS-HCC)    ??? Diabetes mellitus (CMS-HCC)    ??? Hypertension        MEDICATIONS:   Prior to Admission medications    Medication Sig Start Date End Date Taking? Authorizing Provider   amLODIPine (NORVASC) 10 MG tablet Take 1 tablet by mouth. 07/28/15   Historical Provider, MD   apixaban (ELIQUIS) 5 mg Tab Take 1 tablet (5 mg total) by mouth Two (2) times a day. 2 tabs (10 mg) twice a day for 7 days, then 1 tab (5 mg) twice a day 07/05/17 08/04/17  [name] Passy, MD   blood sugar diagnostic (ACCU-CHEK AVIVA) Strp  03/01/14   Historical Provider, MD   crizotinib Cleotis Lema) 250 mg capsule Take 1 capsule (250 mg total) by mouth Two (2) times a day. 06/13/17   Molli Barrows, MD   flash glucose scanning reader 1 each. 09/13/16   Historical Provider, MD   flash glucose sensor kit 3 each. 09/13/16   Historical Provider, MD   furosemide (LASIX) 20 MG tablet TAKE 1 TABLET BY MOUTH EVERY DAY 06/22/17   Maxwell Caul, MD   insulin aspart protamine-insulin aspart (NOVOLOG MIX 70-30FLEXPEN U-100) 100 unit/mL (70-30) injection pen TAKE 10 UNITS 30 MINUTES BEFORE BREAKFAST AND 8 UNITS 30 MINUTES BEFORE SUPPER. 12/16/14   Historical Provider, MD   losartan (COZAAR) 100 MG tablet Take 1 tablet by mouth. 07/21/15   Historical Provider, MD   pen needle, diabetic (BD ULTRA-FINE SHORT PEN NEEDLE) 31 gauge x 5/16 Ndle USE AS DIRECTED. USE 4 NEEDLES A DAY WITH DIABETIC MEDICATION PENS AS DIRECTED 10/07/14   Historical Provider, MD   potassium chloride SA (K-DUR,KLOR-CON) 20 MEQ tablet Take 1 tablet (20 mEq total) by mouth daily. 06/04/17   Edger House, MD   ranitidine (ZANTAC) 150 MG tablet Take 1 tablet (150 mg total) by mouth Two (2) times a  day. 06/27/17 06/27/18  Maren Reamer, PA   simethicone (GAS-X) 80 MG chewable tablet Chew 1 tablet (80 mg total) 4 (four) times a day as needed for flatulence (gas relief). 06/27/17 06/27/18  Maren Reamer, PA       ALLERGIES:   Allergies   Allergen Reactions   ??? Sulfasalazine Nausea Only       SOCIAL HISTORY:   Social History     Tobacco Use   ??? Smoking status: Never Smoker   ??? Smokeless tobacco: Never Used   Substance Use Topics   ??? Alcohol use: No       FAMILY HISTORY:  Family History   Problem Relation Age of Onset   ??? Cancer Brother         lung   ??? Cancer Daughter         lung   ??? Cancer Daughter         breast       EXAM:    CONSTITUTIONAL: Alert and oriented and responds appropriately to questions. Well-appearing; well-nourished  HEAD: Normocephalic  EYES: Conjunctivae clear ENT: normal nose; no rhinorrhea; moist mucous membranes; pharynx without lesions noted  NECK: Supple   CARD: RRR; no murmurs, no clicks, no rubs, no gallops  RESP: Normal chest excursion without splinting or tachypnea; decreased bs's in both bases  ABD/GI: Normal bowel sounds; non-distended; soft, non-tender, no rebound, no guarding  BACK:  The back appears normal and is non-tender to palpation, there is no CVA tenderness  EXT: Normal ROM in all joints; non-tender to palpation;  no edema     SKIN: Normal color for age and race; warm  NEURO: Moves all extremities. Speech normal.   PSYCH: The patient's mood and manner are appropriate. Grooming and personal hygiene are appropriate. Pt does not express HI or SI.    Documentation assistance was provided by Mickle Asper, Scribe, on Jul 15, 2017 at 5:11 PM for Elesa Hacker, MD.      ED PROGRESS:   I have reviewed the old records.  5:41 PM  [name] [name] feels better after the viscous lidocaine.  She was able to drink water without problems except for a little bit of discomfort after taking the viscous lidocaine.     Wendelyn Breslow, MD  07/15/17 [phone]

## 2017-07-16 NOTE — Unmapped (Signed)
Discharge instructions reviewed with patient. At time of discharge, patient A&Ox4, declines dizziness. Patient declines any additional questions or concerns at this time. VSS.

## 2017-07-17 NOTE — Unmapped (Signed)
AOC Triage Note     Patient: [name] [name]     Reason for call:  Xalkori dose and throat pain    Time call returned: 0915     Phone Assessment: Spoke with patient's son who would like to talk with provider regarding decreasing Xalkori dose due to the patient's throat pain. Patient was seen in the ER at [facility] for pain and difficulty swallowing. She was treated with Nystatin and viscous lidocaine for suspected infection. There was no improvement. Patient's son researched online that a side effect of the medication was sores in the throat and that reducing the dose can help. Patient skipped Saturday night dose  And Sunday morning dose. She took last nights dose but he has not given her a dose this morning, he would like to talk with a provider about it. Skipping the doses seemed to have helped the patient's throat pain and swallowing ability.      Triage Recommendations: Will notify care to and request to discuss Xalkori dosing     Patient Response: son verbalized understanding     Outstanding tasks: Please contact patient's son when able to do so to discuss dosing and throat pain     Patient Pharmacy has been verified and primary pharmacy has been marked as preferred

## 2017-07-17 NOTE — Unmapped (Signed)
Clinical Pharmacist Practitioner [facility]: PHONE CONSULT    [name] [name] is a 84 y.o. 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 I spoke with the patient's son and did not speak to the patient directly, as he was not with her at the time of the call. He and his wife handle [name] [name]'s medications.     Current cancer therapy: Crizotinib 250mg  PO BID    HPI: [name] [name] brought his mother to the [facility] ED over the weekend due to issues swallowing. His mom reported that food felt like it was getting stuck and that it was painful to swallow. Per ED notes, no thrush or mucositis noted on exam, but she was discharged with a prescription for Nystatin and lidocaine. [name] [name] read that 11% of patients in clinical trials had stomatitis, so he stopped the crizotinib on Saturday. He states that he gave his mom one dose of the drug last night. She is doing much better and feeling very well today. He says she is eating and drinking fine. However, he feels that the dose of the crizotinib is too high for her.     PLAN:   1. I had a balanced discussion that the side effects of the crizotinib can be managed with more aggressive supportive care. However, [name] [name] felt that the dose of the medication was too high for his mother and would like to try a dose reduction. We will initiate a trial of crizotinib 250mg  PO once daily to see if this helps with side effects, which are mainly related to flatulance/reflux/GI related.   2. I recommended that if she experiences the swallowing discomfort again, that she try to stay on scheduled ranitidine to prevent dyspepsia symptoms. Continue to use simethicone as needed for gas pains.     ASSESSMENT:   [name] [name] reports that [name] [name] was doing well until Friday evening, when she reported that it felt like food wasn't going down her esophagus and she had some pain with swallowing. In the ED they checked labs which were normal. She took one dose of lidocaine in the ED and then felt better. She has not needed to take any more doses of lidocaine at home. She took the Nystatin suspension once, but has not taken it again. After reading information about crizotinib, [name] [name] was concerned that her symptoms were due to the medication so he stopped it on Saturday. He did give the patient another dose on Sunday night as she seemed to be feeling better. She woke up Sunday morning and cooked and ate quite a bit.      I asked about the other medications that she had started for similar symptoms a few weeks ago (simethicone and ranitidine) that seemed to have been helping at her last visit. He states she is taking the simethicone as needed when she feels like she can't burp. She was taking the ranitidine twice daily but stopped 3-5 days ago. I asked if he felt like this timing was about when she started to have issues swallowing and he said maybe. She did take an antacid when she was having symptoms but it didn't help. We discussed that perhaps continuing the ranitidine could help with her symptoms and we could continue at the same dose. I also discussed that the half life of cirzotinib is long (42 hours), and if the patient had stomatitis  or thrush I would expect it to take longer than 24-48 hours for her symptoms to improve. However, [name] [name] is concerned that the dose of the crizotinib is too high for her.     From a cancer perspective, she has no new SOB, nausea, fevers. They are draining her bilateral pleurX catheters every other day, and the output has been decreasing (between 200-365mL per side).     I did offer to schedule an appointment for [name] [name] to be evaluated tomorrow by Dr. Janann August or Dr. Lelon Mast, but [name] [name] felt comfortable holding off if we were going to try a dose reduction.     FOLLOW-UP: Has appointment scheduled 08/08/2017 for labs and toxicity check with Dr. Lelon Mast.     A total of 20 minutes was spent in direct patient care.     Leotis Shames, PharmD, BCOP, CPP

## 2017-07-17 NOTE — Unmapped (Signed)
Devon from [facility] Shared services called and stated that patient's son called and requested to speak with Dr Janann August. Devon stated that the son wanted to talk to Dr Janann August about ED visit and a medication.    Medication : crizotinib Cleotis Lema) 250 mg     Thanks in advance,  Yolanda Manges.  [facility] Communication Center  [phone]

## 2017-07-18 MED FILL — XALKORI/250MG/CAPS: XALKORI/250MG/CAPS | 30 days supply | Qty: 60 | Fill #1

## 2017-07-18 NOTE — Unmapped (Signed)
AOC Triage Note     Patient: [name] [name]     Reason for call:  back pain    Time call returned: 1010     Phone Assessment: Patient's son called to report that the patient started experiencing some lower back pain lat night and this morning. He gave her Tylenol this morning and when he last spoke to her she was pain free. He says the pain occurred when she would turn from side to side. Denies any other new symptoms, fever, pain anywhere else, SOB, trouble breathing, N&V or diarrhea.      Triage Recommendations: I will notify care team but at this time informed him to continue to monitor and call back if there are any new symptoms or pain returns and is more severe.      Patient Response: Son verbalized understanding     Outstanding tasks: none at this time     Patient Pharmacy has been verified and primary pharmacy has been marked as preferred

## 2017-07-18 NOTE — Unmapped (Signed)
Hi,     Patient's son, [name] contacted the Communication Center regarding the following:    Requesting to speak with the patient's nursing team about the patient's status    Please feel free to contact [place] at [phone].    Thanks in advance,    Jodi Mourning  [facility] Communication Center   [phone]

## 2017-07-19 ENCOUNTER — Emergency Department
Admit: 2017-07-19 | Discharge: 2017-07-19 | Disposition: A | Discharge status: Home / Self Care | Payer: MEDICARE | Attending: Emergency Medicine

## 2017-07-19 ENCOUNTER — Ambulatory Visit
Admit: 2017-07-19 | Discharge: 2017-07-19 | Disposition: A | Discharge status: Home / Self Care | Payer: MEDICARE | Attending: Emergency Medicine

## 2017-07-19 DIAGNOSIS — R1110 Vomiting, unspecified: Secondary | ICD-10-CM

## 2017-07-19 DIAGNOSIS — M545 Low back pain: Principal | ICD-10-CM

## 2017-07-19 DIAGNOSIS — R600 Localized edema: Secondary | ICD-10-CM

## 2017-07-19 DIAGNOSIS — N3289 Other specified disorders of bladder: Secondary | ICD-10-CM | POA: Diagnosis not present

## 2017-07-19 DIAGNOSIS — I313 Pericardial effusion (noninflammatory): Secondary | ICD-10-CM | POA: Diagnosis not present

## 2017-07-19 DIAGNOSIS — M4854XA Collapsed vertebra, not elsewhere classified, thoracic region, initial encounter for fracture: Secondary | ICD-10-CM | POA: Diagnosis not present

## 2017-07-19 DIAGNOSIS — S22089A Unspecified fracture of T11-T12 vertebra, initial encounter for closed fracture: Secondary | ICD-10-CM | POA: Diagnosis not present

## 2017-07-19 DIAGNOSIS — R160 Hepatomegaly, not elsewhere classified: Secondary | ICD-10-CM | POA: Diagnosis not present

## 2017-07-19 DIAGNOSIS — I4891 Unspecified atrial fibrillation: Secondary | ICD-10-CM | POA: Diagnosis not present

## 2017-07-19 DIAGNOSIS — J90 Pleural effusion, not elsewhere classified: Secondary | ICD-10-CM | POA: Diagnosis not present

## 2017-07-19 DIAGNOSIS — R1310 Dysphagia, unspecified: Secondary | ICD-10-CM | POA: Diagnosis not present

## 2017-07-19 DIAGNOSIS — R4702 Dysphasia: Secondary | ICD-10-CM | POA: Diagnosis not present

## 2017-07-19 DIAGNOSIS — I639 Cerebral infarction, unspecified: Secondary | ICD-10-CM | POA: Diagnosis not present

## 2017-07-19 DIAGNOSIS — Z882 Allergy status to sulfonamides status: Secondary | ICD-10-CM | POA: Diagnosis not present

## 2017-07-19 DIAGNOSIS — Z9049 Acquired absence of other specified parts of digestive tract: Secondary | ICD-10-CM | POA: Diagnosis not present

## 2017-07-19 DIAGNOSIS — Z9689 Presence of other specified functional implants: Secondary | ICD-10-CM | POA: Diagnosis not present

## 2017-07-19 DIAGNOSIS — R112 Nausea with vomiting, unspecified: Secondary | ICD-10-CM | POA: Diagnosis not present

## 2017-07-19 DIAGNOSIS — C3490 Malignant neoplasm of unspecified part of unspecified bronchus or lung: Secondary | ICD-10-CM | POA: Diagnosis not present

## 2017-07-19 DIAGNOSIS — C799 Secondary malignant neoplasm of unspecified site: Secondary | ICD-10-CM | POA: Diagnosis not present

## 2017-07-19 DIAGNOSIS — I10 Essential (primary) hypertension: Secondary | ICD-10-CM | POA: Diagnosis not present

## 2017-07-19 DIAGNOSIS — M899 Disorder of bone, unspecified: Secondary | ICD-10-CM | POA: Diagnosis not present

## 2017-07-19 DIAGNOSIS — R188 Other ascites: Secondary | ICD-10-CM | POA: Diagnosis not present

## 2017-07-19 DIAGNOSIS — E119 Type 2 diabetes mellitus without complications: Secondary | ICD-10-CM | POA: Diagnosis not present

## 2017-07-19 DIAGNOSIS — M8580 Other specified disorders of bone density and structure, unspecified site: Secondary | ICD-10-CM | POA: Diagnosis not present

## 2017-07-19 DIAGNOSIS — Z8589 Personal history of malignant neoplasm of other organs and systems: Secondary | ICD-10-CM | POA: Diagnosis not present

## 2017-07-19 DIAGNOSIS — D329 Benign neoplasm of meninges, unspecified: Secondary | ICD-10-CM | POA: Diagnosis not present

## 2017-07-19 LAB — URINALYSIS WITH CULTURE REFLEX
BACTERIA: NONE SEEN /HPF
BILIRUBIN UA: NEGATIVE
BLOOD UA: NEGATIVE
GLUCOSE UA: >1000 — AB
KETONES UA: NEGATIVE
LEUKOCYTE ESTERASE UA: NEGATIVE
NITRITE UA: NEGATIVE
PH UA: 7 (ref 5.0–9.0)
PROTEIN UA: NEGATIVE
RBC UA: 1 /HPF (ref <4)
SPECIFIC GRAVITY UA: 1.015 (ref 1.005–1.040)
SQUAMOUS EPITHELIAL: 2 /HPF (ref 0–5)
UROBILINOGEN UA: 1
WBC UA: 2 /HPF (ref 0–5)

## 2017-07-19 LAB — CBC W/ AUTO DIFF
BASOPHILS RELATIVE PERCENT: 0.2 %
EOSINOPHILS RELATIVE PERCENT: 0.2 %
HEMATOCRIT: 30.4 % — ABNORMAL LOW (ref 36.0–46.0)
HEMOGLOBIN: 11.2 g/dL — ABNORMAL LOW (ref 13.5–16.0)
LARGE UNSTAINED CELLS: 1 % (ref 0–4)
LYMPHOCYTES ABSOLUTE COUNT: 0.9 10*9/L — ABNORMAL LOW (ref 1.5–5.0)
MEAN CORPUSCULAR HEMOGLOBIN CONC: 36.6 g/dL (ref 31.0–37.0)
MEAN CORPUSCULAR HEMOGLOBIN: 22.4 pg — ABNORMAL LOW (ref 26.0–34.0)
MEAN CORPUSCULAR VOLUME: 61.2 fL — ABNORMAL LOW (ref 80.0–100.0)
MEAN PLATELET VOLUME: 7.4 fL (ref 7.0–10.0)
MONOCYTES RELATIVE PERCENT: 4.3 %
NEUTROPHILS ABSOLUTE COUNT: 6.9 10*9/L (ref 2.0–7.5)
NEUTROPHILS RELATIVE PERCENT: 82.5 %
PLATELET COUNT: 288 10*9/L (ref 150–440)
RED BLOOD CELL COUNT: 4.97 10*12/L (ref 4.00–5.20)
RED CELL DISTRIBUTION WIDTH: 20.4 % — ABNORMAL HIGH (ref 12.0–15.0)
WBC ADJUSTED: 8.3 10*9/L (ref 4.5–11.0)

## 2017-07-19 LAB — BASIC METABOLIC PANEL
ANION GAP: 6 mmol/L — ABNORMAL LOW (ref 9–15)
BLOOD UREA NITROGEN: 15 mg/dL (ref 7–21)
CALCIUM: 8.7 mg/dL (ref 8.5–10.2)
CHLORIDE: 98 mmol/L (ref 98–107)
CO2: 33 mmol/L — ABNORMAL HIGH (ref 22.0–30.0)
CREATININE: 0.5 mg/dL — ABNORMAL LOW (ref 0.60–1.00)
EGFR MDRD AF AMER: >=60 mL/min/{1.73_m2} (ref >=60)
EGFR MDRD NON AF AMER: >=60 mL/min/{1.73_m2} (ref >=60)
GLUCOSE RANDOM: 254 mg/dL — ABNORMAL HIGH (ref 65–179)
POTASSIUM: 3.1 mmol/L — ABNORMAL LOW (ref 3.5–5.0)
SODIUM: 137 mmol/L (ref 135–145)

## 2017-07-19 LAB — NITRITE UA: Lab: NEGATIVE

## 2017-07-19 LAB — HYPERCHROMASIA

## 2017-07-19 LAB — CO2: Carbon dioxide:SCnc:Pt:Ser/Plas:Qn:: 33 — ABNORMAL HIGH

## 2017-07-19 MED ORDER — TRAMADOL 50 MG TABLET
ORAL_TABLET | Freq: Four times a day (QID) | ORAL | 0 refills | 0 days | Status: CP | PRN
Start: 2017-07-19 — End: 2017-09-12

## 2017-07-19 MED ORDER — LIDOCAINE 5 % TOPICAL PATCH
MEDICATED_PATCH | TRANSDERMAL | 0 refills | 0 days | Status: CP
Start: 2017-07-19 — End: 2017-08-08

## 2017-07-19 NOTE — Unmapped (Signed)
Pt rounds completed. Pt is resting in bed with family at bedside, poc updated, pt denies needs at this time. Will continue to monitor. Bed is low and locked, call light at side, personal belongings are within reach, curtain pulled for privacy.

## 2017-07-19 NOTE — Unmapped (Signed)
Pt rounds completed. Pt is resting in bed with eyes closed, respirations are even and unlabored, nad noted at this time, family at bs, will continue to monitor. Bed is low and locked, call light at side, belongings are within reach, curtain is pulled for privacy.

## 2017-07-19 NOTE — Unmapped (Signed)
[phone] [name] - pt's son

## 2017-07-19 NOTE — Unmapped (Signed)
Hem/Onc Phone Triage Note    Caller: Patient's son    Reason for Call:   [name] [name] is a 84 y.o. 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 Patient's son calls in noting she has developed new, progressive back pain over the past few weeks. Located to her low back right side of her spine. Notes she has also had bilateral lower extremities weakness in this timeframe where she has difficulty getting into and out of a seated position. Requires help now to get one or both legs up and into bed. Pain recurred last evening and she took a single acetaminophen around 530pm with some relief. Pain recurred overnight, her son believes she took another 2 acetaminophen around bedtime ~10-11pm without relief. He notes she was 'up all night' with the pain in her back and had emesis because of the pain x1. No bowel/bladder incontinence, no sensory deficits.    Assessment/Plan:   -While this may very well be non-oncologic MSK low back pain in an 84yo, in light of her known metastatic cancer on single agent small molecular inhibitor and patient personal avoidance of chemotherapy, this new low paraspinal pain does raise concern for possible metastatic lesion. We have limited imaging of her abd/pelvis/L-Spine. In light of the reports of new weakness, despite it being bilateral, I did ask they present to ED for evaluation and dedicated imaging. Her episode of emesis may represent CNS progression in general and she is due for repeat MRI Brain on 08/04/17. Will call ED in advance.     Please page Oncology Consults at [phone] if questions about care occur.     Fellow Taking Call:  Everrett Coombe Dahmir Epperly  Jul 19, 2017 6:57 AM

## 2017-07-19 NOTE — Unmapped (Signed)
Pt ambulated to restroom with minimal assitance by holding this na hand.  Pt given a menu and instructions on how to call this na when she is ready to order.

## 2017-07-19 NOTE — Unmapped (Signed)
Discharge instructions discussed with patient and discharge teaching complete. Pt verbalizes understanding and signed dc papers. All questions answered at this time. Patient discharged in no apparent distress and with all patient belongings. PIV removed with cath tip intact, bleeding controlled, gauze, and tape placed. Rx's and referral provided.

## 2017-07-19 NOTE — Unmapped (Signed)
Delirium Triage Screening (DTS) performed. Patient screened negative.

## 2017-07-19 NOTE — Unmapped (Signed)
PHYSICAL THERAPY  Evaluation (07/19/17 1340)     Patient Name:  [name] [name]       Medical Record Number: [id number]   Date of Birth: [birth year]  Sex: Female            Treatment Diagnosis: s/p compression fracture T12    ASSESSMENT    Patient is a 84 y.o. female with PMH of metastatic lung cancer (on small molecular inhibitor Crizotinib), PE, plaural effusion, a-fib (on Eliquis), DM, HTN who presents to the ED for 2 days of progressively worsening lower back pain.  Pt presents to PT with new T12 compression fracture and instructed in log roll technique for comfort, recommend continuation of baseline mobility with ambulation household and community distances, and RW if needed for comfort.  Pt A/O to self and situation and to date and location with cues.  Pt has supportive family with 24/7 care rotating between different family members.  Pt able to get up off gurney and ambulate on level surfaces in hallway of ED without assistive device.  Pt safe to return home and no need for additional PT services after discharge.       Based on the AM-PAC 6 item raw score of 23/24, the patient is considered to be 16.55% impaired with basic mobility.     Today's Interventions: Evaluation of back pain; instructed pt and family at bedside on log roll techniqe; recommend continuation of in home and community mobility without asisstive device.  Son at bedside asking about Rollator device and instructed that this would be appopriate for longer distances especially if pt needing seated rest for fatigue, but it was not necessary in order for patient to return home.    Activity Tolerance: Patient tolerated treatment well    PLAN  Planned Frequency of Treatment:  D/C Services for: D/C Services      Planned Interventions:      Post-Discharge Physical Therapy Recommendations:  PT services not indicated        PT DME Recommendations: None     Goals:   Patient and Family Goals: to go home       Prognosis:  Good  Barriers to Discharge: None  Positive Indicators: family support, independent at baseline    SUBJECTIVE  Patient reports: I am not hurting.  Current Functional Status: pt up walking to bathroom, returned to sitting EOB with family at bedside and meal tray.  Services patient receives: PT  Prior functional status: Amb without assistive device household and community distances; able to dress and bathe self in shower without seat, has a non-slip mat in tub.  Pain started overnight controlled with Tylenol  Equipment available at home: Levan Hurst    Past Medical History:   Diagnosis Date   ??? Atrial fibrillation (CMS-HCC)    ??? Cancer (CMS-HCC)    ??? Diabetes mellitus (CMS-HCC)    ??? Hypertension    ??? Metastatic lung cancer (metastasis from lung to other site) (CMS-HCC)    ??? Pulmonary embolism (CMS-HCC)     Social History     Tobacco Use   ??? Smoking status: Never Smoker   ??? Smokeless tobacco: Never Used   Substance Use Topics   ??? Alcohol use: No      Past Surgical History:   Procedure Laterality Date   ??? CHG RAD GUIDED,PERCUT DRAINAGE,W/CATH PLACE Right 06/19/2017    Procedure: RADIOLOGICAL GUIDANCE, FOR PERCUTANEOUS DRAINAGE, WITH PLACEMENT OF CATHETER, RAD S&I;  Surgeon: Sherwood Gambler, MD;  Location: [facility]  PROCEDURE LAB [facility];  Service: Pulmonary   ??? CHOLECYSTECTOMY     ??? HYSTERECTOMY     ??? neck surgey     ??? PR INSERTION INDWELLING TUNNELED PLEURAL CATHETER Bilateral 06/19/2017    Procedure: INSERTION OF INDWELLING TUNNELED PLEURAL CATHETER WITH CUFF WITH MODERATE SEDATION;  Surgeon: Sherwood Gambler, MD;  Location: [facility] PROCEDURE LAB [facility];  Service: Pulmonary   ??? PR MOD SED SAME PHYS/QHP EACH ADDL 15 MINS  06/19/2017    Procedure: MODERATE SEDATION SERVICES PROVIDED BY SAME PHYSICIAN OR OTHER QUALIFIED HEALTH CARE PROFESSIONAL PERFORMING THE DIAGNOSTIC OR THERAPEUTIC SERVICE THAT SEDATION SUPPORTS, EACH ADDITIONAL 15 MINS;  Surgeon: Sherwood Gambler, MD;  Location: [facility] PROCEDURE LAB [facility];  Service: Pulmonary ??? PR MOD SED SAME PHYS/QHP INITIAL 15 MINS 5/> YRS  06/19/2017    Procedure: MODERATE SEDATION SERVICES PROVIDED BY SAME PHYSICIAN OR OTHER QUALIFIED HC PROFESSIONAL PERFORMING THE DIAGNOSTIC OR THERAPEUTIC SERVICE THAT SEDATION SUPPORTS, INIT 15 MINS, PT AGE 21 YEARS OR OLDER;  Surgeon: Sherwood Gambler, MD;  Location: [facility] PROCEDURE LAB [facility];  Service: Pulmonary    Family History   Problem Relation Age of Onset   ??? Cancer Brother         lung   ??? Cancer Daughter         lung   ??? Cancer Daughter         breast        Allergies: Sulfasalazine      Objective Findings              Precautions: Non-applicable              Weight Bearing Status: Non- applicable              Required Braces or Orthoses: Non- applicable    Communication Preference: Verbal  Pain Comments: 0/10  Medical Tests / Procedures: reviewed in chart  Equipment / Environment: Vascular access (PIV, TLC, Port-a-cath, PICC)    At Rest: VSS per Epic, cleared by RN  With Activity: NAD   Orthostatics: asymptomatic  Airway Clearance: OOB mobility    Living environment: House  Lives With: Family  Home Living: One level home;Stairs to enter with rails  Rail placement (outside): Rail on right side     Number of Stairs: 3    Cognition: Alert and oriented to self and to place/date with cues; oriented to situation     Skin Inspection: visible areas c/d/i    UE ROM: WFL  UE Strength: WFL  LE ROM: WFL  LE Strength: 5/5                Coordination: gross motor movements intact      Sensation: intact to light touch B LE's  Balance: Sitting: indpendent with and wihtout feet supported and without UE support; Standing: Mod I-supervision during functional mobility.         Bed Mobility: Supine<>sit with CGA for log roll technique with verbal and tactile cues.  Transfers: Sit<>stand independently, rising on first attempt with good balance and body mechanics.   Gait: Pt amb 125' independently without device and overall steady step through gait pattern.   Stairs: not tested      Endurance: good for activities performed    Eval Duration(PT): 35 Min.    Medical Staff Made Aware: RN and MD     I attest that I have reviewed the above information.  Signed: Anders Grant, PT  Filed 07/19/2017

## 2017-07-19 NOTE — Unmapped (Signed)
[facility]  Emergency Department Provider Note  ??   ED Clinical Impression   ??  Final diagnoses:   T12 compression fracture (CMS-HCC) (Primary)   Dysphagia, unspecified type      Impression, ED Course, Assessment and Plan   ??  Impression:     Patient is a 84 y.o. female with PMH of metastatic lung cancer (on small molecular inhibitor Crizotinib), PE, plaural effusion, a-fib (on Eliquis), DM, HTN who presents to the ED for 2 days of progressively worsening lower back pain with 1 episode of associated emesis. No BLE weakness. Pt has had ongoing dysphagia for which she has close out-patient follow up already. Pt and son report this has improved following recent med changes but has not fully resolved.    On exam, the patient is elderly appearing but in NAD. VS are WNL. 1+ pitting edema to BLE half way up calves. Intact strength and sensation. LS clear, equal, unlabored bilaterally throughout. HRR normal. Remaining PE otherwise unremarkable.      Differential includes worsening metastatic disease, MSK back pain, pyelonephritis, less likely kidney stone.     Plan for MRI brain w wo contrast in the setting ofdysphasia and vomiting per oncology recommendations, CT lumbar spine, labs. Will give Tylenol and Voltaren gel then reassess.     12:35 PM  CT lumbar spine shows T12 inferior endplate compression fracture, new from 06/03/2017. Small bilateral pleural effusions with incompletely imaged chest tubes in place. Moderate pericardial effusion. Known liver mass and mesenteric mass redemonstrated. Small-volume ascites. Osteopenia. Scattered densely sclerotic bone lesions are clearly could reflect metastases, (possibly treated) or bone islands. Distention of the urinary bladder.   Discussed findings with oncology.  Pericardial effusion is noted on previous imaging.  This is less likely represent metastatic disease.  They will continue to follow.    MRI brain showed Evolving small right frontoparietal infarct. Decreased prominence of right cerebral enhancing lesions. These are favored to represent evolving subcortical infarcts. Metastases are felt less likely. Similar appearance of left frontal calvarial metastasis with extension into the epidural space. Unchanged left paramedian vertex lesion, favored to represent venous structure. Meningioma felt less likely. Spoke with neuro regarding infarct who states the area is at least a couple days old. It is not in a distrubution that would effect her swallowing. Neuro recommends continued Eliquis and follow up oncology.     1:00 PM  Will give Tramadol and lidocaine patch. Order PT evaluation.     2:14 PM   PT has evaluated the pt in the ED. safe for discharge home.  Patient feels improved after lidocaine and tramadol.  Patient, son and physical therapy feel the patient is Will discharge the patient home with Tramadol, lidocaine patches, symptomatic care instructions, and PCP follow up. Return precautions discussed. Patient states understanding and is agreeable to the plan.      Additional Medical Decision Making     I have reviewed the vital signs and the nursing notes. Labs and radiology results that were available during my care of the patient were independently reviewed by me and considered in my medical decision making. I reviewed the patient's prior medical records. I independently visualized the radiology images. I discussed the case with the oncology and neurology consultants.     Portions of this record have been created using Scientist, clinical (histocompatibility and immunogenetics). Dictation errors have been sought, but may not have been identified and corrected.  ____________________________________________       History   ??  Chief Complaint  Back Pain      HPI   [name] [name] is a 84 y.o. female with PMH of metastatic lung cancer (on small molecular inhibitor Crizotinib), PE, plaural effusion, a-fib (on Eliquis), DM, HTN who presents to the ED for 2 days of progressively worsening lower back pain. States the pain was so severe last night she could hardly turn over in bed and had one episode of emesis. Denies any recent injury or trauma. She currently rates her pain as 5/10, non-radiating, worsened with movement. She has been taking Tylenol which relieves her pain sometimes. Pt called her oncology team this AM and was instructed to come in to the ED for imaging 2/2 concern for potential worsening metastatic disease. Denies fevers, chills, urinary symptoms, BLE weakness or other focal weakness/numbness/tingling, chest pain, shortness of breath, abd pain, or other medical concerns at this time. Of note, pt has denied any BLE weakness at multiple times during our discussion.   Pt and son also complain of dysphasia, described as a feeling of food getting stuck in the throat.  Patient has been seen in the ED for this prior.  Treated with Diflucan without significant improvement.  Per oncology notes, medications were changed with some improvement in symptoms.  Patient and family report the patient is able to tolerate all p.o. but feels like things get stuck in her throat.  On review oncology telephone notes,  Oncology recommends MRI of the brain given patient's recent vomiting and dysphagia to evaluate for worsening metastatic disease.    Past Medical History:   Diagnosis Date   ??? Atrial fibrillation (CMS-HCC)    ??? Diabetes mellitus (CMS-HCC)    ??? Hypertension      Patient Active Problem List   Diagnosis   ??? SOB (shortness of breath)   ??? Hypoxemia   ??? Acute pulmonary embolism (CMS-HCC)   ??? HTN (hypertension)   ??? Diabetes mellitus, type 2 (CMS-HCC)   ??? Metastatic lung cancer (metastasis from lung to other site), unspecified laterality (CMS-HCC)   ??? Addison anemia   ??? Lipoma of back   ??? Weight loss   ??? Lump of breast, right   ??? Pernicious anemia   ??? Weight loss, abnormal   ??? Anemia   ??? Need for shingles vaccine   ??? Pleural effusion     Past Surgical History:   Procedure Laterality Date   ??? CHG RAD GUIDED,PERCUT DRAINAGE,W/CATH PLACE Right 06/19/2017    Procedure: RADIOLOGICAL GUIDANCE, FOR PERCUTANEOUS DRAINAGE, WITH PLACEMENT OF CATHETER, RAD S&I;  Surgeon: Sherwood Gambler, MD;  Location: [facility]ON[facility] PROCEDURE LAB [facility];  Service: Pulmonary   ??? CHOLECYSTECTOMY     ??? HYSTERECTOMY     ??? neck surgey     ??? PR INSERTION INDWELLING TUNNELED PLEURAL CATHETER Bilateral 06/19/2017    Procedure: INSERTION OF INDWELLING TUNNELED PLEURAL CATHETER WITH CUFF WITH MODERATE SEDATION;  Surgeon: Sherwood Gambler, MD;  Location: BRON[facility] PROCEDURE LAB [facility];  Service: Pulmonary   ??? PR MOD SED SAME PHYS/QHP EACH ADDL 15 MINS  06/19/2017    Procedure: MODERATE SEDATION SERVICES PROVIDED BY SAME PHYSICIAN OR OTHER QUALIFIED HEALTH CARE PROFESSIONAL PERFORMING THE DIAGNOSTIC OR THERAPEUTIC SERVICE THAT SEDATION SUPPORTS, EACH ADDITIONAL 15 MINS;  Surgeon: Sherwood Gambler, MD;  Location: BRONCH PROCEDURE LAB [facility];  Service: Pulmonary   ??? PR MOD SED SAME PHYS/QHP INITIAL 15 MINS 5/> YRS  06/19/2017    Procedure: MODERATE SEDATION SERVICES PROVIDED BY SAME PHYSICIAN OR OTHER QUALIFIED HC  PROFESSIONAL PERFORMING THE DIAGNOSTIC OR THERAPEUTIC SERVICE THAT SEDATION SUPPORTS, INIT 15 MINS, PT AGE 88 YEARS OR OLDER;  Surgeon: Sherwood Gambler, MD;  Location: [facility] PROCEDURE LAB [facility];  Service: Pulmonary     No current facility-administered medications for this encounter.     Current Outpatient Medications:   ???  amLODIPine (NORVASC) 10 MG tablet, Take 1 tablet by mouth., Disp: , Rfl:   ???  apixaban (ELIQUIS) 5 mg Tab, Take 1 tablet (5 mg total) by mouth Two (2) times a day. 2 tabs (10 mg) twice a day for 7 days, then 1 tab (5 mg) twice a day, Disp: 60 tablet, Rfl: 0  ???  blood sugar diagnostic (ACCU-CHEK AVIVA) Strp, , Disp: , Rfl:   ???  crizotinib (XALKORI) 250 mg capsule, Take 1 capsule (250 mg total) by mouth Two (2) times a day., Disp: 60 capsule, Rfl: 3  ???  flash glucose scanning reader, 1 each., Disp: , Rfl:   ???  flash glucose sensor kit, 3 each., Disp: , Rfl:   ???  furosemide (LASIX) 20 MG tablet, TAKE 1 TABLET BY MOUTH EVERY DAY, Disp: 30 tablet, Rfl: 0  ???  insulin aspart protamine-insulin aspart (NOVOLOG MIX 70-30FLEXPEN U-100) 100 unit/mL (70-30) injection pen, TAKE 10 UNITS 30 MINUTES BEFORE BREAKFAST AND 8 UNITS 30 MINUTES BEFORE SUPPER., Disp: , Rfl:   ???  lidocaine 2% viscous (XYLOCAINE) 2 % Soln, 15 mL by Mouth route every four (4) hours as needed., Disp: 200 mL, Rfl: 0  ???  losartan (COZAAR) 100 MG tablet, Take 1 tablet by mouth., Disp: , Rfl:   ???  nystatin (MYCOSTATIN) 100,000 unit/mL suspension, Take 5 mL (500,000 Units total) by mouth Four (4) times a day., Disp: 60 mL, Rfl: 0  ???  pen needle, diabetic (BD ULTRA-FINE SHORT PEN NEEDLE) 31 gauge x 5/16 Ndle, USE AS DIRECTED. USE 4 NEEDLES A DAY WITH DIABETIC MEDICATION PENS AS DIRECTED, Disp: , Rfl:   ???  potassium chloride SA (K-DUR,KLOR-CON) 20 MEQ tablet, Take 1 tablet (20 mEq total) by mouth daily., Disp: 30 tablet, Rfl: 3  ???  ranitidine (ZANTAC) 150 MG tablet, Take 1 tablet (150 mg total) by mouth Two (2) times a day., Disp: 60 tablet, Rfl: 1  ???  simethicone (GAS-X) 80 MG chewable tablet, Chew 1 tablet (80 mg total) 4 (four) times a day as needed for flatulence (gas relief)., Disp: 100 tablet, Rfl: 2    Allergies  Sulfasalazine    Family History   Problem Relation Age of Onset   ??? Cancer Brother         lung   ??? Cancer Daughter         lung   ??? Cancer Daughter         breast     Social History  Social History     Tobacco Use   ??? Smoking status: Never Smoker   ??? Smokeless tobacco: Never Used   Substance Use Topics   ??? Alcohol use: No   ??? Drug use: Never     Review of Systems    All 10 systems have been reviewed and are negative except as otherwise documented.     Physical Exam     ED Triage Vitals [07/19/17 0839]   Enc Vitals Group      BP 108/60      Pulse       SpO2 Pulse 91      Resp 14  Temp 36 ??C (96.8 ??F)      Temp Source Oral      SpO2 98 % Constitutional: Alert and oriented. Well appearing and in no distress.  Eyes: Conjunctivae are normal.  ENT       Head: Normocephalic and atraumatic.       Mouth/Throat: Mucous membranes are moist. No thrush noted.        Neck: No stridor.  Cardiovascular: Normal rate, regular rhythm. Normal and symmetric distal pulses are present in all extremities.  Respiratory: Normal respiratory effort. Breath sounds are normal.  Gastrointestinal: Soft and nontender.  Musculoskeletal: Normal range of motion in all extremities. 1+ pitting edema to BLE half way up calves.   Neurologic: Normal speech and language. No gross focal neurologic deficits are appreciated. Intact strength and sensation BLE. Gait stable.   Skin: Skin is warm, dry and intact. No rash noted.  Psychiatric: Mood and affect are normal. Speech and behavior are normal.     Radiology     CT lumbar spine WO contrast   Final Result   -- T12 inferior endplate compression fracture, new from 06/03/2017.   --Small bilateral pleural effusions with incompletely imaged chest tubes in place.   --Moderate pericardial effusion.   -- Known liver mass and mesenteric mass redemonstrated.   -- Small-volume ascites.   -- Osteopenia.   -- Scattered densely sclerotic bone lesions are clearly could reflect metastases, (possibly treated) or bone islands.   -- Distention of the urinary bladder      MRI Brain W Wo Contrast   Preliminary Result      Evolving small right frontoparietal infarct.      Decreased prominence of right cerebral enhancing lesions. These are favored to represent evolving subcortical infarcts. Metastases are felt less likely.      Similar appearance of left frontal calvarial metastasis with extension into the epidural space.      Unchanged left paramedian vertex lesion, favored to represent venous structure. Meningioma felt less likely.         _________________________________  Documentation assistance was provided by Marquis Lunch, Scribe, on Jul 19, 2017 at 8:53 AM for Shaune Leeks, MD.      Jul 19, 2017 7:38 PM. Documentation assistance provided by the scribe. I was present during the time the encounter was recorded. The information recorded by the scribe was done at my direction and has been reviewed and validated by me.                Sherryl Barters, MD  07/19/17 1946

## 2017-07-19 NOTE — Unmapped (Signed)
Pt ambulatory to the bathroom with a steady gait noted, PT at side.

## 2017-07-19 NOTE — Unmapped (Signed)
Pt presents for eval of lower back pain x 2 days that is progressively getting worse. Pt states the pain became so severe last night that she could hardly turn over in the bed. Pt denies any recent injury or urinary sx.

## 2017-07-19 NOTE — Unmapped (Signed)
Patient transported to CT Scan  Transported by Radiology  How tranported Stretcher  Cardiac Monitor no

## 2017-07-23 NOTE — Unmapped (Signed)
Outpatient Call Note    Date and Time: Jul 23, 2017 3:59 PM    Primary Oncologist: Dr. Janann August    Reason for Call: Out of pleurx supplies    Discussion:   Ms. [name] [name] calls today to report that they are out of supplies for his mother's plerux catheter which they are instructed to change every other day. They used the last of their supplies today and looked at their information from the supplying company and noted no additional supplies had been assigned to be delivered as they are waiting on approval from the physician's office. Both the clinic and the supply delivery company are closed tomorrow due to the holiday, and he is in need of additional supplies in time for the change on Tuesday.    Review of the orders shows a home health order from 5/22. Cannot find a place to renew this and am not sure that this would solve the problem soon enough through the supply company. Informed [name] that I will pass this information along to the clinical team to see how to best expedite this. He is able to come to [facility] on Tuesday to pick up additional supplies as well, however I am not sure whether we have what she needs in the hospital or clinic.    Message was sent to primary oncology team.    Fellow Triaging Call:  Lance Coon MD  Hem/Onc Fellow PGY4

## 2017-07-25 DIAGNOSIS — J910 Malignant pleural effusion: Secondary | ICD-10-CM | POA: Diagnosis not present

## 2017-07-25 NOTE — Unmapped (Signed)
AOC Triage Note     Patient: [name] [name]     Reason for call:  Out of PleurX bottles    Time call returned: 0845     Phone Assessment: Attempted to contact pt regarding PleurX bottles. Left vm to return call to schedule pick up.     Triage Recommendations: Provide one bottle and follow up on home health services.     Patient Response: N/A     Outstanding tasks: Awaiting return call.     Patient Pharmacy has been verified and primary pharmacy has been marked as preferred

## 2017-07-25 NOTE — Unmapped (Signed)
Pt's son [name], returned [facility] nurse triage call. He will come to clinic to pick up PleurX bottles to use today.    He said that pt's home health nurse signed off on her care two weeks ago and ordered supplies at that time. He attempted to call the company, [employer], directly last Thursday and made a payment. He was later told that he could not order supplies.    Tammy, please follow up or PleurX drainage supply order as pt is no longer receiving home health services.

## 2017-07-25 NOTE — Unmapped (Signed)
Hi,     Patients son, [name] contacted the Communication Center regarding the following:    Patients plurex bottles.  He states that he was informed by [employer] that patient should receive them via Home Health and he states that she doesnt have home health services anymore.  She is completely out as she shouldve been drained this morning.  He's paid the medicare and deductible of over $200 to [employer].  He would like to know how and when she should get her plurex bottles       Please feel free to contact Terry at [phone].    Thanks in advance,    Kathrene Bongo  [facility]   [phone]

## 2017-07-27 DIAGNOSIS — R079 Chest pain, unspecified: Principal | ICD-10-CM

## 2017-07-27 DIAGNOSIS — Z882 Allergy status to sulfonamides status: Secondary | ICD-10-CM | POA: Diagnosis not present

## 2017-07-27 DIAGNOSIS — Z794 Long term (current) use of insulin: Secondary | ICD-10-CM | POA: Diagnosis not present

## 2017-07-27 DIAGNOSIS — Z7901 Long term (current) use of anticoagulants: Secondary | ICD-10-CM | POA: Diagnosis not present

## 2017-07-27 DIAGNOSIS — Z9049 Acquired absence of other specified parts of digestive tract: Secondary | ICD-10-CM | POA: Diagnosis not present

## 2017-07-27 DIAGNOSIS — I2782 Chronic pulmonary embolism: Secondary | ICD-10-CM | POA: Diagnosis not present

## 2017-07-27 DIAGNOSIS — R599 Enlarged lymph nodes, unspecified: Secondary | ICD-10-CM | POA: Diagnosis not present

## 2017-07-27 DIAGNOSIS — E119 Type 2 diabetes mellitus without complications: Secondary | ICD-10-CM | POA: Diagnosis not present

## 2017-07-27 DIAGNOSIS — I4891 Unspecified atrial fibrillation: Secondary | ICD-10-CM | POA: Diagnosis not present

## 2017-07-27 DIAGNOSIS — R0789 Other chest pain: Secondary | ICD-10-CM | POA: Diagnosis not present

## 2017-07-27 DIAGNOSIS — Z79899 Other long term (current) drug therapy: Secondary | ICD-10-CM | POA: Diagnosis not present

## 2017-07-27 DIAGNOSIS — R918 Other nonspecific abnormal finding of lung field: Secondary | ICD-10-CM | POA: Diagnosis not present

## 2017-07-27 DIAGNOSIS — I2699 Other pulmonary embolism without acute cor pulmonale: Secondary | ICD-10-CM | POA: Diagnosis not present

## 2017-07-27 DIAGNOSIS — I10 Essential (primary) hypertension: Secondary | ICD-10-CM | POA: Diagnosis not present

## 2017-07-27 DIAGNOSIS — C799 Secondary malignant neoplasm of unspecified site: Secondary | ICD-10-CM | POA: Diagnosis not present

## 2017-07-27 DIAGNOSIS — C3490 Malignant neoplasm of unspecified part of unspecified bronchus or lung: Secondary | ICD-10-CM | POA: Diagnosis not present

## 2017-07-27 DIAGNOSIS — R160 Hepatomegaly, not elsewhere classified: Secondary | ICD-10-CM | POA: Diagnosis not present

## 2017-07-27 DIAGNOSIS — J90 Pleural effusion, not elsewhere classified: Secondary | ICD-10-CM | POA: Diagnosis not present

## 2017-07-27 DIAGNOSIS — Z90710 Acquired absence of both cervix and uterus: Secondary | ICD-10-CM | POA: Diagnosis not present

## 2017-07-27 LAB — COMPREHENSIVE METABOLIC PANEL
ALBUMIN: 2.4 g/dL — ABNORMAL LOW (ref 3.5–5.0)
ALKALINE PHOSPHATASE: 123 U/L (ref 38–126)
ALT (SGPT): 16 U/L (ref 15–48)
ANION GAP: 2 mmol/L — ABNORMAL LOW (ref 9–15)
AST (SGOT): 15 U/L (ref 14–38)
BILIRUBIN TOTAL: 0.4 mg/dL (ref 0.0–1.2)
BLOOD UREA NITROGEN: 13 mg/dL (ref 7–21)
BUN / CREAT RATIO: 19
CHLORIDE: 102 mmol/L (ref 98–107)
CO2: 33 mmol/L — ABNORMAL HIGH (ref 22.0–30.0)
CREATININE: 0.68 mg/dL (ref 0.60–1.00)
EGFR MDRD AF AMER: >=60 mL/min/{1.73_m2} (ref >=60)
EGFR MDRD NON AF AMER: >=60 mL/min/{1.73_m2} (ref >=60)
GLUCOSE RANDOM: 234 mg/dL — ABNORMAL HIGH (ref 65–179)
POTASSIUM: 3.8 mmol/L (ref 3.5–5.0)
PROTEIN TOTAL: 5.5 g/dL — ABNORMAL LOW (ref 6.5–8.3)
SODIUM: 137 mmol/L (ref 135–145)

## 2017-07-27 LAB — CBC W/ AUTO DIFF
BASOPHILS ABSOLUTE COUNT: 0 10*9/L (ref 0.0–0.1)
BASOPHILS RELATIVE PERCENT: 0.2 %
EOSINOPHILS ABSOLUTE COUNT: 0.1 10*9/L (ref 0.0–0.4)
EOSINOPHILS RELATIVE PERCENT: 2 %
HEMATOCRIT: 29.1 % — ABNORMAL LOW (ref 36.0–46.0)
HEMOGLOBIN: 10.5 g/dL — ABNORMAL LOW (ref 13.5–16.0)
LYMPHOCYTES RELATIVE PERCENT: 16.9 %
MEAN CORPUSCULAR HEMOGLOBIN CONC: 36.1 g/dL (ref 31.0–37.0)
MEAN CORPUSCULAR HEMOGLOBIN: 22.6 pg — ABNORMAL LOW (ref 26.0–34.0)
MEAN CORPUSCULAR VOLUME: 62.5 fL — ABNORMAL LOW (ref 80.0–100.0)
MEAN PLATELET VOLUME: 7.4 fL (ref 7.0–10.0)
MONOCYTES ABSOLUTE COUNT: 0.3 10*9/L (ref 0.2–0.8)
MONOCYTES RELATIVE PERCENT: 4.7 %
NEUTROPHILS ABSOLUTE COUNT: 5.4 10*9/L (ref 2.0–7.5)
NEUTROPHILS RELATIVE PERCENT: 74.9 %
PLATELET COUNT: 314 10*9/L (ref 150–440)
RED BLOOD CELL COUNT: 4.66 10*12/L (ref 4.00–5.20)
WBC ADJUSTED: 7.2 10*9/L (ref 4.5–11.0)

## 2017-07-27 LAB — PROTIME: Lab: 16.9 — ABNORMAL HIGH

## 2017-07-27 LAB — TROPONIN I: Troponin I.cardiac:MCnc:Pt:Ser/Plas:Qn:: <0.06

## 2017-07-27 LAB — APTT
Coagulation surface induced:Time:Pt:PPP:Qn:Coag: 41.7 — ABNORMAL HIGH
HEPARIN CORRELATION: 0.2

## 2017-07-27 LAB — ANION GAP: Anion gap 3:SCnc:Pt:Ser/Plas:Qn:: 2 — ABNORMAL LOW

## 2017-07-27 LAB — TARGET CELLS

## 2017-07-27 LAB — LYMPHOCYTES ABSOLUTE COUNT: Lab: 1.2 — ABNORMAL LOW

## 2017-07-28 ENCOUNTER — Emergency Department
Admit: 2017-07-28 | Discharge: 2017-07-28 | Disposition: A | Discharge status: Home / Self Care | Payer: MEDICARE | Attending: Emergency Medicine

## 2017-07-28 ENCOUNTER — Ambulatory Visit
Admit: 2017-07-28 | Discharge: 2017-07-28 | Disposition: A | Discharge status: Home / Self Care | Payer: MEDICARE | Attending: Emergency Medicine

## 2017-07-28 DIAGNOSIS — R079 Chest pain, unspecified: Principal | ICD-10-CM

## 2017-07-28 DIAGNOSIS — J90 Pleural effusion, not elsewhere classified: Secondary | ICD-10-CM | POA: Diagnosis not present

## 2017-07-28 DIAGNOSIS — I2699 Other pulmonary embolism without acute cor pulmonale: Secondary | ICD-10-CM | POA: Diagnosis not present

## 2017-07-28 DIAGNOSIS — J910 Malignant pleural effusion: Secondary | ICD-10-CM | POA: Diagnosis not present

## 2017-07-28 DIAGNOSIS — R918 Other nonspecific abnormal finding of lung field: Secondary | ICD-10-CM | POA: Diagnosis not present

## 2017-07-28 LAB — TROPONIN I: Troponin I.cardiac:MCnc:Pt:Ser/Plas:Qn:: <0.06

## 2017-07-28 NOTE — Unmapped (Signed)
[facility]  Emergency Department Attestation Note  Emergency Department Provider Note       ED Clinical Impression     Final diagnoses:   None        Impression, ED Course, Assessment and Plan     Impression: 84 y.o. female with PMH of metastatic lung cancer (on Crizotinib), bilateral pleural effusions with bilateral Pleurx catheters, DM, HTN, A-fib (on Eliquis), PE presents for evaluation of left-sided chest pain onset after her Pleurx catheter was drained this evening. Will obtain CXR, EKG, CBC, CMP, coags, and troponin x2.     1:25 AM  CXR shows decreased size of bilateral pleural effusions. Labs unremarkable. Will obtain CTA chest to evaluate for PE.     CT of the chest shows no acute abnormality.  In fact, some of her lesions are smaller.  I had a long discussion with the patient about further testing and observation, which I however, given her well appearance, lack of desaturation, no evidence of pleural effusions, and reasonable explanation of suction trauma from her Pleurx catheter as a cause of her pain, I believe it is reasonable to send her home.  However, I impressed upon the patient the importance of following up with her primary care physician or oncologist as soon as possible to discuss the symptoms.    Strict return precautions were reviewed with the patient. The patient was advised to return to the emergency department with any troubling symptoms. I advised the patient to follow up with her primary care doctor within one week for re-evaluation and to assure improvement in symptoms. The patient is comfortable with this plan and will be discharged.      ____________________________________________    Time seen: Jul 28, 2017 1:08 AM    I have reviewed the triage vital signs and the nursing notes.      History     Chief Complaint  Chest Pain      HPI   [name] [name] is a 84 y.o. female with PMH of metastatic lung cancer (on Crizotinib), DM, HTN, A-fib (on Eliquis), PE presents for evaluation of chest pain. Patient had bilateral thoracenteses on 3/20 and had Pleurx tubes placed on 06/19/17. Patient typically gets her Pleurx tubes drained in the morning. Today, family was not able to drain her catheters until 8 PM this evening. After family drained patient's left catheter, she had onset of pain in her left sided chest that is worsened with deep inspiration. She reports a painful area on her left chest wall, but also thinks she has chest pain located internally. She denies any shortness of breath. Patient had a PE diagnosed 2-3 months ago and is currently on Eliquis.       Past Medical History:   Diagnosis Date   ??? Atrial fibrillation (CMS-HCC)    ??? Cancer (CMS-HCC)    ??? Diabetes mellitus (CMS-HCC)    ??? Hypertension    ??? Metastatic lung cancer (metastasis from lung to other site) (CMS-HCC)    ??? Pulmonary embolism (CMS-HCC)        Past Surgical History:   Procedure Laterality Date   ??? CHG RAD GUIDED,PERCUT DRAINAGE,W/CATH PLACE Right 06/19/2017    Procedure: RADIOLOGICAL GUIDANCE, FOR PERCUTANEOUS DRAINAGE, WITH PLACEMENT OF CATHETER, RAD S&I;  Surgeon: Sherwood Gambler, MD;  Location: [facility] PROCED[facility] LAB [facility];  Service: Pulmonary   ??? CHOLECYSTECTOMY     ??? HYSTERECTOMY     ??? neck surgey     ??? PR  INSERTION INDWELLING TUNNELED PLEURAL CATHETER Bilateral 06/19/2017    Procedure: INSERTION OF INDWELLING TUNNELED PLEURAL CATHETER WITH CUFF WITH MODERATE SEDATION;  Surgeon: Sherwood Gambler, MD;  Location: [facility] PROCED[facility] LAB [facility];  Service: Pulmonary   ??? PR MOD SED SAME PHYS/QHP EACH ADDL 15 MINS  06/19/2017    Procedure: MODERATE SEDATION SERVICES PROVIDED BY SAME PHYSICIAN OR OTHER QUALIFIED HEALTH CARE PROFESSIONAL PERFORMING THE DIAGNOSTIC OR THERAPEUTIC SERVICE THAT SEDATION SUPPORTS, EACH ADDITIONAL 15 MINS;  Surgeon: Sherwood Gambler, MD;  Location: [facility]ON[facility] PROCEDURE LAB [facility];  Service: Pulmonary   ??? PR MOD SED SAME PHYS/QHP INITIAL 15 MINS 5/> YRS  06/19/2017 Procedure: MODERATE SEDATION SERVICES PROVIDED BY SAME PHYSICIAN OR OTHER QUALIFIED HC PROFESSIONAL PERFORMING THE DIAGNOSTIC OR THERAPEUTIC SERVICE THAT SEDATION SUPPORTS, INIT 15 MINS, PT AGE 17 YEARS OR OLDER;  Surgeon: Sherwood Gambler, MD;  Location: [facility]ON[facility] PROCEDURE LAB [facility];  Service: Pulmonary       No current facility-administered medications for this encounter.     Current Outpatient Medications:   ???  amLODIPine (NORVASC) 10 MG tablet, Take 1 tablet by mouth., Disp: , Rfl:   ???  apixaban (ELIQUIS) 5 mg Tab, Take 1 tablet (5 mg total) by mouth Two (2) times a day. 2 tabs (10 mg) twice a day for 7 days, then 1 tab (5 mg) twice a day, Disp: 60 tablet, Rfl: 0  ???  blood sugar diagnostic (ACCU-CHEK AVIVA) Strp, , Disp: , Rfl:   ???  crizotinib (XALKORI) 250 mg capsule, Take 1 capsule (250 mg total) by mouth Two (2) times a day., Disp: 60 capsule, Rfl: 3  ???  flash glucose scanning reader, 1 each., Disp: , Rfl:   ???  flash glucose sensor kit, 3 each., Disp: , Rfl:   ???  furosemide (LASIX) 20 MG tablet, TAKE 1 TABLET BY MOUTH EVERY DAY, Disp: 30 tablet, Rfl: 0  ???  insulin aspart protamine-insulin aspart (NOVOLOG MIX 70-30FLEXPEN U-100) 100 unit/mL (70-30) injection pen, TAKE 10 UNITS 30 MINUTES BEFORE BREAKFAST AND 8 UNITS 30 MINUTES BEFORE SUPPER., Disp: , Rfl:   ???  lidocaine (LIDODERM) 5 % patch, Place 1 patch on the skin daily. Apply to affected area for 12 hours only each day (then remove patch), Disp: 30 patch, Rfl: 0  ???  lidocaine 2% viscous (XYLOCAINE) 2 % Soln, 15 mL by Mouth route every four (4) hours as needed., Disp: 200 mL, Rfl: 0  ???  losartan (COZAAR) 100 MG tablet, Take 1 tablet by mouth., Disp: , Rfl:   ???  nystatin (MYCOSTATIN) 100,000 unit/mL suspension, Take 5 mL (500,000 Units total) by mouth Four (4) times a day., Disp: 60 mL, Rfl: 0  ???  pen needle, diabetic (BD ULTRA-FINE SHORT PEN NEEDLE) 31 gauge x 5/16 Ndle, USE AS DIRECTED. USE 4 NEEDLES A DAY WITH DIABETIC MEDICATION PENS AS DIRECTED, Disp: , Rfl:   ???  potassium chloride SA (K-DUR,KLOR-CON) 20 MEQ tablet, Take 1 tablet (20 mEq total) by mouth daily., Disp: 30 tablet, Rfl: 3  ???  ranitidine (ZANTAC) 150 MG tablet, Take 1 tablet (150 mg total) by mouth Two (2) times a day., Disp: 60 tablet, Rfl: 1  ???  simethicone (GAS-X) 80 MG chewable tablet, Chew 1 tablet (80 mg total) 4 (four) times a day as needed for flatulence (gas relief)., Disp: 100 tablet, Rfl: 2    Allergies  Sulfasalazine    Family History   Problem Relation Age of Onset   ??? Cancer Brother  lung   ??? Cancer Daughter         lung   ??? Cancer Daughter         breast       Social History  Social History     Tobacco Use   ??? Smoking status: Never Smoker   ??? Smokeless tobacco: Never Used   Substance Use Topics   ??? Alcohol use: No   ??? Drug use: Never         Review of Systems  All 10 systems have been reviewed and are negative except as otherwise documented.       Physical Exam     VITAL SIGNS:    ED Triage Vitals [07/27/17 2234]   Enc Vitals Group      BP 131/61      Heart Rate 77      SpO2 Pulse       Resp 16      Temp 37.1 ??C (98.7 ??F)      Temp Source Oral      SpO2 99 %     Constitutional: Alert and oriented. Well appearing and in no distress.  Eyes: Conjunctivae are normal.  ENT       Head: Normocephalic and atraumatic.       Nose: No congestion.       Mouth/Throat: Mucous membranes are moist.       Neck: No stridor.  Hematological/Lymphatic/Immunilogical: No cervical lymphadenopathy.  Cardiovascular: Normal rate, regular rhythm. Normal and symmetric distal pulses are present in all extremities.  Respiratory: Normal respiratory effort. Breath sounds are normal.  Gastrointestinal: Soft and nontender. There is no CVA tenderness.  Musculoskeletal: Nontender with normal range of motion in all extremities.       Right lower leg: No tenderness or edema.       Left lower leg: No tenderness or edema.  Neurologic: Normal speech and language. No gross focal neurologic deficits are appreciated.  Skin: Skin is warm, dry and intact. No rash noted.  Psychiatric: Mood and affect are normal. Speech and behavior are normal.       Radiology     XR Chest 2 views   Preliminary Result      No acute airspace disease.      Decreased size of bilateral pulmonary masses.             Laboratory Data     Lab Results   Component Value Date    WBC 7.2 07/27/2017    HGB 10.5 (L) 07/27/2017    HCT 29.1 (L) 07/27/2017    PLT 314 07/27/2017       Lab Results   Component Value Date    NA 137 07/27/2017    K 3.8 07/27/2017    CL 102 07/27/2017    CO2 33.0 (H) 07/27/2017    BUN 13 07/27/2017    CREATININE 0.68 07/27/2017    GLU 234 (H) 07/27/2017    CALCIUM 8.4 (L) 07/27/2017    MG 1.8 06/20/2017       Lab Results   Component Value Date    BILITOT 0.4 07/27/2017    BILIDIR 0.20 07/11/2017    PROT 5.5 (L) 07/27/2017    ALBUMIN 2.4 (L) 07/27/2017    ALT 16 07/27/2017    AST 15 07/27/2017    ALKPHOS 123 07/27/2017       Lab Results   Component Value Date    INR 1.48 07/27/2017    APTT 41.7 (H)  07/27/2017         Pertinent labs & imaging results that were available during my care of the patient were reviewed by me and considered in my medical decision making (see chart for details).    Documentation assistance was provided by Daine Gravel, Scribe, on Jul 28, 2017 at 1:08 AM for Phebe Colla, MD.     July 29, 2017 6:26 AM. Documentation assistance provided by the above mentioned scribe. I was present during the time the encounter was recorded. The information recorded by the scribe was done at my direction and has been reviewed and validated by me.          Alma Friendly, MD  07/29/17 [phone]

## 2017-07-28 NOTE — Unmapped (Signed)
Patient presents with chest pain.  Son states patient has lung CA and a pleurex drain on both sides of chest to drain fluid. Son state drainage is normal with progressive decrease in amount.  Patient reports pain localized to left chest directly after left cath was drained.  Worse with inspiration.

## 2017-07-28 NOTE — Unmapped (Signed)
Pt ambulated to bathroom w/little to no assistance, no distress noted at this time.

## 2017-07-28 NOTE — Unmapped (Signed)
Patient transported to CT Scan  Transported by Radiology  How tranported Stretcher  Cardiac Monitor no

## 2017-07-28 NOTE — Unmapped (Signed)
Teeter MD at bedside.

## 2017-08-01 ENCOUNTER — Ambulatory Visit: Admit: 2017-08-01 | Discharge: 2017-08-02 | Payer: MEDICARE | Attending: Family Medicine | Primary: Family Medicine

## 2017-08-01 DIAGNOSIS — R1013 Epigastric pain: Secondary | ICD-10-CM

## 2017-08-01 DIAGNOSIS — S22080A Wedge compression fracture of T11-T12 vertebra, initial encounter for closed fracture: Secondary | ICD-10-CM

## 2017-08-01 DIAGNOSIS — Z794 Long term (current) use of insulin: Secondary | ICD-10-CM

## 2017-08-01 DIAGNOSIS — I10 Essential (primary) hypertension: Secondary | ICD-10-CM

## 2017-08-01 DIAGNOSIS — E119 Type 2 diabetes mellitus without complications: Secondary | ICD-10-CM

## 2017-08-01 DIAGNOSIS — R071 Chest pain on breathing: Principal | ICD-10-CM

## 2017-08-01 DIAGNOSIS — I2699 Other pulmonary embolism without acute cor pulmonale: Secondary | ICD-10-CM

## 2017-08-01 MED ORDER — AMLODIPINE 10 MG TABLET
ORAL_TABLET | Freq: Every day | ORAL | 3 refills | 0.00000 days | Status: CP
Start: 2017-08-01 — End: 2017-08-22

## 2017-08-01 MED ORDER — RANITIDINE 150 MG TABLET
ORAL_TABLET | Freq: Two times a day (BID) | ORAL | 3 refills | 0.00000 days | Status: SS
Start: 2017-08-01 — End: 2018-01-01

## 2017-08-01 MED ORDER — APIXABAN 5 MG TABLET
ORAL_TABLET | Freq: Two times a day (BID) | ORAL | 3 refills | 0.00000 days | Status: CP
Start: 2017-08-01 — End: 2017-09-19

## 2017-08-01 MED ORDER — LOSARTAN 100 MG TABLET
ORAL_TABLET | Freq: Every day | ORAL | 3 refills | 0.00000 days | Status: CP
Start: 2017-08-01 — End: 2017-11-28

## 2017-08-01 MED ORDER — FUROSEMIDE 20 MG TABLET
ORAL_TABLET | Freq: Every day | ORAL | 3 refills | 0 days | Status: CP
Start: 2017-08-01 — End: 2017-08-08

## 2017-08-01 MED ORDER — METFORMIN ER 500 MG TABLET,EXTENDED RELEASE 24 HR
ORAL_TABLET | Freq: Two times a day (BID) | ORAL | 0 refills | 0 days | Status: CP
Start: 2017-08-01 — End: 2017-08-24

## 2017-08-01 MED ORDER — POTASSIUM CHLORIDE ER 20 MEQ TABLET,EXTENDED RELEASE(PART/CRYST)
ORAL_TABLET | Freq: Every day | ORAL | 1 refills | 0.00000 days | Status: SS
Start: 2017-08-01 — End: 2018-01-01

## 2017-08-01 NOTE — Unmapped (Addendum)
Stable/controlled   Continue amlodipine 10mg , losartan 100mg , lasix prn, and kdur  - Will look to wean regimen as she has had multiple soft pressures on various OVs  BP Readings from Last 3 Encounters:   08/01/17 107/55   07/28/17 149/61   07/19/17 111/58

## 2017-08-01 NOTE — Unmapped (Addendum)
Asymptomatic today. Recent discharge from ED on 07/28/2017. Related to PleurX catheter.  -CXR showed decreased size of bilateral pleural effusions. Labs unremarkable.  -CT of the chest showed no acute abnormality, in fact, some of her lesions are smaller.  -Will continue to monitor this

## 2017-08-01 NOTE — Unmapped (Addendum)
Borderline controlled. Home BGs running 120s-400s but also with diet indiscretion. Recommend limiting sugary snacks and limit fruits to recommended portions (watermelon and cantaloupe)  START Metformin XR 500mg  BID and increase to 1000mg  BID after 1-2 weeks.  Continue novolog 70/30 at dose: 5U QAM, 5U QPM  Continue checking blood sugars qachs and bring in log to f/u visit. Will consider titrating up insulin    Lab Results   Component Value Date    A1C 10.3 (A) 08/01/2017    A1C 8.1 (H) 05/19/2017

## 2017-08-01 NOTE — Unmapped (Addendum)
Noted on CT lumbar spine on 07/19/2017. Pt having persistent pan  - Continue otc Tylenol 1000mg  prn, lido patches prn, and tramadol prn from ED for severe pain.  - Good response to pain plan above so will hold off on ortho referral.  - Will consider bisphosphonates on next visit

## 2017-08-01 NOTE — Unmapped (Addendum)
Emboli in right upper lobe segmental and subsegmental arteries found 04/30/17.   Continue eliquis 5mg  BID  Metastatic lung cancer w/ pleural effusion S/p pleural catheter insertion on 06/19/2017

## 2017-08-01 NOTE — Unmapped (Signed)
Stable/well controlled on Zantac 150mg -continue this.

## 2017-08-01 NOTE — Unmapped (Signed)
Assessment and Plan  [name] [name] is a 84 y.o. female with a PMH as above who presents to the clinic for the following:    Problem List Items Addressed This Visit     Acute pulmonary embolism (CMS-HCC)     Emboli in right upper lobe segmental and subsegmental arteries found 04/30/17.   Continue eliquis 5mg  BID  Metastatic lung cancer w/ pleural effusion S/p pleural catheter insertion on 06/19/2017         Relevant Medications    apixaban (ELIQUIS) 5 mg Tab    HTN (hypertension)     Stable/controlled   Continue amlodipine 10mg , losartan 100mg , lasix prn, and kdur  - Will look to wean regimen as she has had multiple soft pressures on various OVs  BP Readings from Last 3 Encounters:   08/01/17 107/55   07/28/17 149/61   07/19/17 111/58            Relevant Medications    amLODIPine (NORVASC) 10 MG tablet    furosemide (LASIX) 20 MG tablet    losartan (COZAAR) 100 MG tablet    potassium chloride SA (K-DUR,KLOR-CON) 20 MEQ tablet    Diabetes mellitus, type 2 (CMS-HCC)     Borderline controlled. Home BGs running 120s-400s but also with diet indiscretion. Recommend limiting sugary snacks and limit fruits to recommended portions (watermelon and cantaloupe)  START Metformin XR 500mg  BID and increase to 1000mg  BID after 1-2 weeks.  Continue novolog 70/30 at dose: 5U QAM, 5U QPM  Continue checking blood sugars qachs and bring in log to f/u visit. Will consider titrating up insulin    Lab Results   Component Value Date    A1C 10.3 (A) 08/01/2017    A1C 8.1 (H) 05/19/2017               Relevant Medications    metFORMIN (GLUCOPHAGE-XR) 500 MG 24 hr tablet    Other Relevant Orders    POCT glycosylated hemoglobin (Hb A1C) (Completed)    Chest pain on breathing - Primary     Asymptomatic today. Recent discharge from ED on 07/28/2017. Related to PleurX catheter.  -CXR showed decreased size of bilateral pleural effusions. Labs unremarkable.  -CT of the chest showed no acute abnormality, in fact, some of her lesions are smaller. -Will continue to monitor this              T12 compression fracture (CMS-HCC)     Noted on CT lumbar spine on 07/19/2017. Pt having persistent pan  - Continue otc Tylenol 1000mg  prn, lido patches prn, and tramadol prn from ED for severe pain.  - Good response to pain plan above so will hold off on ortho referral.  - Will consider bisphosphonates on next visit         Dyspepsia     Stable/well controlled on Zantac 150mg -continue this.                Return in about 1 month (around 08/29/2017), or if symptoms worsen or fail to improve, for dm2.    [name]  08/01/2017    Chief Complaint  Chief Complaint   Patient presents with   ??? Follow-up       HPI  [name] [name] is a 84 y.o. female with a PMH as below who presents for the following:    Chest pain  Pt reports chest pain from ED visit has resolved.     Thoracic compression fracture  Pt reports having occassional back pain.She currently takes 1000mg  otc Tylenol and Salonpas for pain relief.     Acute pulmonary embolism  Emboli in right upper lobe segmental and subsegmental arteries 04/30/17, taking apixaban BID.     HTN  Currently taking amlodipine 5mg , losartan 100mg , lasix 20mg  prn and potassium supplement.     Metastatic lung cancer  Left upper lung 10cm mass found 04/30/17 with 2.7cm right upper lobe nodule. With mediastinal lymphadenopathy and hepatic lobe mass. Liver lesion biopsied 3/21 confirmed adenocarcinoma of lung.   - upcoming f/u for pleural effusion. Stop eliquis as recommend.  - No cardiopulm complaints - admission sxs of CP/DOE have resolved    DM2  -Pts was previously followed by endo at [facility].  -Has not been seen by endo in ~34mo due to her endocrinologist moving practices    -Son states pts daily BS have been running 120s-400s  -Pt has been on insulin for 1-2 years. She has been using insulin 5QAM and 5QPM.   Lab Results   Component Value Date    A1C 10.3 (A) 08/01/2017     Last OV  Anemia  Son reports pt has hx of anemia; no iron studies on file.     Past Medical History:   Diagnosis Date   ??? Atrial fibrillation (CMS-HCC)    ??? Cancer (CMS-HCC)    ??? Diabetes mellitus (CMS-HCC)    ??? Hypertension    ??? Metastatic lung cancer (metastasis from lung to other site) (CMS-HCC)    ??? Pulmonary embolism (CMS-HCC)      Current Outpatient Medications on File Prior to Visit   Medication Sig Dispense Refill   ??? blood sugar diagnostic (ACCU-CHEK AVIVA) Strp      ??? crizotinib (XALKORI) 250 mg capsule Take 1 capsule (250 mg total) by mouth Two (2) times a day. 60 capsule 3   ??? flash glucose scanning reader 1 each.     ??? flash glucose sensor kit 3 each.     ??? insulin aspart protamine-insulin aspart (NOVOLOG MIX 70-30FLEXPEN U-100) 100 unit/mL (70-30) injection pen TAKE 10 UNITS 30 MINUTES BEFORE BREAKFAST AND 8 UNITS 30 MINUTES BEFORE SUPPER.     ??? lidocaine (LIDODERM) 5 % patch Place 1 patch on the skin daily. Apply to affected area for 12 hours only each day (then remove patch) 30 patch 0   ??? nystatin (MYCOSTATIN) 100,000 unit/mL suspension Take 5 mL (500,000 Units total) by mouth Four (4) times a day. 60 mL 0   ??? pen needle, diabetic (BD ULTRA-FINE SHORT PEN NEEDLE) 31 gauge x 5/16 Ndle USE AS DIRECTED. USE 4 NEEDLES A DAY WITH DIABETIC MEDICATION PENS AS DIRECTED     ??? simethicone (GAS-X) 80 MG chewable tablet Chew 1 tablet (80 mg total) 4 (four) times a day as needed for flatulence (gas relief). 100 tablet 2   ??? [EXPIRED] traMADol (ULTRAM) 50 mg tablet Take 1 tablet (50 mg total) by mouth every six (6) hours as needed for pain. for up to 5 days 20 tablet 0     No current facility-administered medications on file prior to visit.      Social History     Socioeconomic History   ??? Marital status: Widowed     Spouse name: Not on file   ??? Number of children: Not on file   ??? Years of education: Not on file   ??? Highest education level: Not on file   Occupational History   ??? Not  on file   Social Needs   ??? Financial resource strain: Not on file   ??? Food insecurity:     Worry: Not on file     Inability: Not on file   ??? Transportation needs:     Medical: Not on file     Non-medical: Not on file   Tobacco Use   ??? Smoking status: Never Smoker   ??? Smokeless tobacco: Never Used   Substance and Sexual Activity   ??? Alcohol use: No   ??? Drug use: Never   ??? Sexual activity: Not on file   Lifestyle   ??? Physical activity:     Days per week: Not on file     Minutes per session: Not on file   ??? Stress: Not on file   Relationships   ??? Social connections:     Talks on phone: Not on file     Gets together: Not on file     Attends religious service: Not on file     Active member of club or organization: Not on file     Attends meetings of clubs or organizations: Not on file     Relationship status: Not on file   Other Topics Concern   ??? Do you use sunscreen? No   ??? Tanning bed use? No   ??? Are you easily burned? No   ??? Excessive sun exposure? No   ??? Blistering sunburns? No   Social History Narrative   ??? Not on file       The following portions of the patient's history were reviewed and updated as appropriate: allergies, current medications, past family history, past medical history, past social history, past surgical history and problem list.    Review of Systems   Comprehensive ROS negative except above    PHYSICAL EXAM  BP 107/55  - Pulse 83  - Temp 36.1 ??C (97 ??F) (Oral)  - Ht 162.6 cm (5' 4)  - Wt 51.3 kg (113 lb 1.9 oz)  - SpO2 97%  - BMI 19.42 kg/m??   General appearance: alert, cooperative, NAD   Head: normocephalic and atraumatic  Eyes: normal EOMs, PERRL, normal conjunctiva, no discharge  ENT: OP clear and moist, no tonsillar exudates or erythema, normal TMs and external ear canals, normal nasal passages  Neck: normal ROM, supple, no thyromegaly, no cervical/supraclavicular lymphadenopathy  Lungs: normal work of breathing, good air entry and upper lobes and clear to auscultation bilaterally, decreased bibasilar breath sounds. no wheezes or crackles appreciated   Heart: regular rate and rhythm, S1, S2 normal, no murmur, click, rub or gallop   Abdomen: Abdomen soft, non-tender, non distended. No masses, no organomegaly. Bowel sounds present.  Extremities: extremities normal, atraumatic, no cyanosis or edema  NEURO: AAOx3, appropriately responds to questions, spontaneous movement of extremities  Psych: normal mood and affect    [facility] Components:     Barriers to goals identified and addressed. Pertinent handouts were given today and reviewed with the patient as indicated.  The Care Plan and Self-Management goals have been included on the AVS and the AVS has been printed. Any outside resources or referrals needed at this time are noted above. Patient's current medications have been reviewed. Any new medications prescribed have been discussed, and side effects have been addressed. Have assessed the patient's understanding, response, and barriers to adherence to medications. Patient voiced understanding and all questions have been answered to satisfaction.     I attest that I, Ursula Beath,  personally documented this note while acting as scribe for Dr. Jarvis Newcomer B. Allena Katz, MD.   Ursula Beath, Scribe  08/01/2017 11:18 AM

## 2017-08-08 ENCOUNTER — Ambulatory Visit
Admit: 2017-08-08 | Discharge: 2017-08-09 | Payer: MEDICARE | Attending: Hematology & Oncology | Primary: Hematology & Oncology

## 2017-08-08 ENCOUNTER — Other Ambulatory Visit: Admit: 2017-08-08 | Discharge: 2017-08-09 | Payer: MEDICARE

## 2017-08-08 DIAGNOSIS — C3490 Malignant neoplasm of unspecified part of unspecified bronchus or lung: Principal | ICD-10-CM

## 2017-08-08 DIAGNOSIS — Z801 Family history of malignant neoplasm of trachea, bronchus and lung: Secondary | ICD-10-CM | POA: Diagnosis not present

## 2017-08-08 DIAGNOSIS — E1165 Type 2 diabetes mellitus with hyperglycemia: Secondary | ICD-10-CM | POA: Diagnosis not present

## 2017-08-08 DIAGNOSIS — Z888 Allergy status to other drugs, medicaments and biological substances status: Secondary | ICD-10-CM | POA: Diagnosis not present

## 2017-08-08 DIAGNOSIS — Z794 Long term (current) use of insulin: Secondary | ICD-10-CM | POA: Diagnosis not present

## 2017-08-08 DIAGNOSIS — Z7901 Long term (current) use of anticoagulants: Secondary | ICD-10-CM | POA: Diagnosis not present

## 2017-08-08 DIAGNOSIS — C7931 Secondary malignant neoplasm of brain: Secondary | ICD-10-CM | POA: Diagnosis not present

## 2017-08-08 DIAGNOSIS — Z79899 Other long term (current) drug therapy: Secondary | ICD-10-CM | POA: Diagnosis not present

## 2017-08-08 DIAGNOSIS — I4891 Unspecified atrial fibrillation: Secondary | ICD-10-CM | POA: Diagnosis not present

## 2017-08-08 DIAGNOSIS — C787 Secondary malignant neoplasm of liver and intrahepatic bile duct: Secondary | ICD-10-CM | POA: Diagnosis not present

## 2017-08-08 DIAGNOSIS — I10 Essential (primary) hypertension: Secondary | ICD-10-CM | POA: Diagnosis not present

## 2017-08-08 DIAGNOSIS — Z803 Family history of malignant neoplasm of breast: Secondary | ICD-10-CM | POA: Diagnosis not present

## 2017-08-08 DIAGNOSIS — R5383 Other fatigue: Secondary | ICD-10-CM | POA: Diagnosis not present

## 2017-08-08 LAB — CBC W/ AUTO DIFF
BASOPHILS ABSOLUTE COUNT: 0 10*9/L (ref 0.0–0.1)
BASOPHILS RELATIVE PERCENT: 0.2 %
EOSINOPHILS ABSOLUTE COUNT: 0 10*9/L (ref 0.0–0.4)
EOSINOPHILS RELATIVE PERCENT: 0.2 %
HEMATOCRIT: 31.9 % — ABNORMAL LOW (ref 36.0–46.0)
HEMOGLOBIN: 11.5 g/dL — ABNORMAL LOW (ref 12.0–16.0)
LARGE UNSTAINED CELLS: 0 % (ref 0–4)
LYMPHOCYTES ABSOLUTE COUNT: 1 10*9/L — ABNORMAL LOW (ref 1.5–5.0)
LYMPHOCYTES RELATIVE PERCENT: 12.2 %
MEAN CORPUSCULAR HEMOGLOBIN CONC: 36 g/dL (ref 31.0–37.0)
MEAN CORPUSCULAR HEMOGLOBIN: 22.9 pg — ABNORMAL LOW (ref 26.0–34.0)
MEAN CORPUSCULAR VOLUME: 63.6 fL — ABNORMAL LOW (ref 80.0–100.0)
MEAN PLATELET VOLUME: 7.8 fL (ref 7.0–10.0)
MONOCYTES ABSOLUTE COUNT: 0.3 10*9/L (ref 0.2–0.8)
MONOCYTES RELATIVE PERCENT: 3.5 %
NEUTROPHILS RELATIVE PERCENT: 83.4 %
PLATELET COUNT: 356 10*9/L (ref 150–440)
RED CELL DISTRIBUTION WIDTH: 18.6 % — ABNORMAL HIGH (ref 12.0–15.0)
WBC ADJUSTED: 8.3 10*9/L (ref 4.5–11.0)

## 2017-08-08 LAB — COMPREHENSIVE METABOLIC PANEL
ALBUMIN: 2.5 g/dL — ABNORMAL LOW (ref 3.5–5.0)
ALKALINE PHOSPHATASE: 144 U/L — ABNORMAL HIGH (ref 38–126)
ALT (SGPT): 24 U/L (ref 15–48)
ANION GAP: 13 mmol/L (ref 9–15)
AST (SGOT): 16 U/L (ref 14–38)
BILIRUBIN TOTAL: 0.7 mg/dL (ref 0.0–1.2)
BUN / CREAT RATIO: 27
CALCIUM: 8.4 mg/dL — ABNORMAL LOW (ref 8.5–10.2)
CHLORIDE: 99 mmol/L (ref 98–107)
CO2: 21 mmol/L — ABNORMAL LOW (ref 22.0–30.0)
CREATININE: 0.84 mg/dL (ref 0.60–1.00)
EGFR MDRD NON AF AMER: >=60 mL/min/{1.73_m2} (ref >=60)
GLUCOSE RANDOM: 508 mg/dL (ref 65–179)
POTASSIUM: 4.1 mmol/L (ref 3.5–5.0)
PROTEIN TOTAL: 5.7 g/dL — ABNORMAL LOW (ref 6.5–8.3)
SODIUM: 133 mmol/L — ABNORMAL LOW (ref 135–145)

## 2017-08-08 LAB — POIKILOCYTES

## 2017-08-08 LAB — RED BLOOD CELL COUNT: Lab: 5.02

## 2017-08-08 LAB — POTASSIUM: Potassium:SCnc:Pt:Ser/Plas:Qn:: 4.1

## 2017-08-08 LAB — SLIDE REVIEW

## 2017-08-08 MED ORDER — CALCIUM CARBONATE 600 MG(1,500 MG)-VITAMIN D3 800 UNIT CHEWABLE TABLET
Freq: Two times a day (BID) | ORAL | 0 days
Start: 2017-08-08 — End: ?

## 2017-08-08 NOTE — Unmapped (Addendum)
It was a pleasure to see you in clinic today.  - Congratulations! the CT scan they did in the ED shows that the lung and liver lesions have gotten smaller on Crizotinib  - Continue Crizotinib at full dose 250mg  twice a day  - Your BLOOD SUGAR IS 508 in clinic today.  We will give you some short acting insulin in clinic to bring this down right now. However, we STRONGLY recommend going back to your PCP with your blood glucose log and discuss increasing the insulin dosing to get better control   - Given the recent compression fracture, we recommend taking Calcium 600mg -vitamin D 400 IU  supplements TWICE a day to help strengthen bones. e.g CALTRATE chew tablet  - Continue to drain the fluid from the PleurX catheters.  When is drops below for 3 consecutive tries, contact interventional pulmonology, or if you have new pain or other challenges with the catheters  - We will see you back in a month for a checkup but please call us and we can see you sooner if needed.    Please call our nurse navigator if you have any interval questions or concerns:  Nurse Navigator: Leisa Lenz, RN:  [email or web address]  Nurse Triage Line: [phone]  ??  For emergencies, evenings or weekends, please call [phone] and ask for oncology fellow on call.  ??  For appointments, please call [phone].  For [facility] pharmacy, please call [phone].

## 2017-08-08 NOTE — Unmapped (Signed)
Clinical Pharmacy Practitioner- Thoracic Oncology Clinic    ORAL CHEMOTHERAPY- CLINICAL FOLLOW-UP    Patient Information: [name] [name] is a 84 y.o. year-old female with stage IV adenoCA NSCLC who I am seeing today for monitoring of critzotinib.     Pharmacy: [facility]    Medication access/copay: no issues reported by patient, COPAY $0, last fill 5.21.2019    PMH: reviewed    Current medications: a medication reconciliation was completed and medications were updated in EPIC    Allergies: reviewed    Cultural assessment:   -Relevant cultural assessment and health literacy was completed. Patient is able to store his/her medication as directed.  -This patient does not have any cognitive or physical disabilities   -This patient's primary spoken language is [place].      Adherence: reports no missed doses in the past month    Drug Interactions    Clinically relevant drug interactions identified:  no       Administration: Patient confirmed taking the appropriate doses of medication - 250 mg BID. Out of concern for side effects (ie, difficulty swallowing), the patient had decreased the dose to 250 mg daily for 1 week (starting ~5/20). After 1 week, she increased the dose back to 250 mg BID.    Adverse Effects: The following adverse effects were discussed, including related supportive care recommendations and emergency situations:       A. Fatigue- patient reporting that she is not able to do as much around the house, likely multifactorial due to cancer, hyperglycemia, and decreased mobility due to recent compression fracture. Crizotinib could be contributing, although feel it is minimal.   B. Swallowing difficulty- resolved, continues on ranitidine and simethicone. Crizotinib likely minimally contributory to this toxicity.         Quality of Life/Appropriateness of therapy: other than mild fatigue, Ms. Linhart reports that her QoL is good overall. Most recent CT scan provides evidence that tumor is shrinking, supporting the continued use of crizotinib at this time.    Other issues discussed:   ?? Patient's BG was 508 mg/dL on a CMP today. She is taking metformin ER 1000 mg BID (recently increased from 500 mg BID) and Novolog Mix 70/30 - 5 units BID. Her DM is managed by her PCP. Her most recent visit with them was on 6/4, where they added metformin. A1C that day was 10.3%.  ?? Patient presented to ED on 5/22 with back pain. Found to have T12 compression fracture. Unclear if this is due to pre-existing osteopenia or metastatic disease. For now started on calcium and vitamin D. Will hold off on zoledronic acid or denosumab for now given age/renal function/ and unclear etiology.     Plan:    1. Crizotinib monitoring and side effect management  - Patient is tolerating therapy well with minimal side effects (grade 1 fatigue only)  - Continue 250 mg BID    2. Diabetes mellitus, type 2  - Uncontrolled based on BG of 508 mg/dL in clinic today and Z6X of 10.3% on 6/4  - Give 10 units of insulin lispro in clinic today  - Talk with PCP about increasing home insulin regimen; also mention that you were previously followed by an endocrinologist in case they would like to make a referral    F/u: next visit with Eloise Harman, MD    I spent 15 minutes with [name] [name] in direct patient care.    Clement Husbands, PharmD, BCPS  PGY2 Oncology Resident  Pager: [phone]  I discussed the patient with the resident and agree with the history and plan provided in the note.     Leotis Shames, PharmD, BCOP, CPP  Pager [phone]

## 2017-08-08 NOTE — Unmapped (Signed)
Referral to Outpatient Oncology Palliative Care Clinic (OOPC)     08/08/17: NEW pt referral to Outpatient Palliative Care Clinic [facility]) from [place].      Reason for referral: Pt was screened for REACH study but declined but opted for palliative care engagement.       Summary: 84 yo F, never smoker, recently dx with stage 4 lung cancer in late March 2019.    Initiated crizotinib on 06/23/17 and dose reduced to 250mg  QD on 07/17/17 and then went back up again to BID on 07/24/17.  Has generalized fatigue.  No vision changes.   Had an ED visit 07/28/17  for chest/back pain     lives with her her 2 teenage grandsons and has attentive sons/daughter close by.  Her daughter-in-law has been sleeping overnight with her since diagnosis.  She dresses herself, showers, able to do ADLs but family does most of cooking, grocery shopping and cleaning.  Her son manages her medicines and finances. She is otherwise feeling reasonable well.  Has some fatigue. No falls. Does not use a walker or cane.  She is able to walk around [place] but after exertion like this, has some chest pressure and has to sit down to rest, with resolution usually by .  No stairs at home.  She has insulin dependent DM, HTN and Afib, controlled with meds.        Plan:Marland Kitchen Appointment request sent to [facility] scheduler for next, new patient available appointment.     Allegra Lai RN BSN OCN MS  RN Clinical Coordinator  Outpatient Oncology Palliative Care (OOPC)  [facility]  Pager: [phone]  Phone: [phone]

## 2017-08-08 NOTE — Unmapped (Signed)
Attending attestation: I saw the patient together with the fellow and agree with the plan as documented. [name] [name] for visit: [name] [name] is seen at the request of Dr. Curt Bears  for a thoracic medical oncology opinion regarding new diagnosis of Stage IV NSCLC.      HPI:  [name] [name] is an 84 yo F, never smoker, who initially became symptomatic  with dyspnea and cough in late Feb 2019.   History as follows:  Oncology History    - 04/30/17: Presented to ED with dyspnea for over 2 weeks.  CTA with PEs, b/l pleural effusions, mediastinal soft tissue mass 9/5/7cm, invading LUL nd 8.2cm lesion inleft hepatic lobe  - 05/17/17: b/l thoracentesis with 2L removed each lung.  Cytology with no malignant cells but lymphocyte predominant exudative effusiions  - 05/18/17: Biopsy liver lesion c/w lung adenocarcinoma        She lives with her her 2 teenage grandsons and has attentive sons/daughter close by.  Her daughter-in-law has been sleeping overnight with her since diagnosis.  She dresses herself, showers, able to do ADLs but family does most of cooking, grocery shopping and cleaning.  Her son manages her medicines and finances. She is otherwise feeling reasonable well.  Has some fatigue. No falls. Does not use a walker or cane.  She is able to walk around [place] but after exertion like this, has some chest pressure and has to sit down to rest, with resolution usually by .  No stairs at home.  She has insulin dependent DM, HTN and Afib, controlled with meds.      Interval Hx  Initiated crizotinib on 06/23/17 and dose reduced to 250mg  QD on 07/17/17 and then went back up again to BID on 07/24/17.  Has generalized fatigue.  No vision changes.   Had an ED visit 07/28/17  for chest/back pain after draining PleurX at home, no PE on CTA and did demonstrate reduction in size of LUL, RUL and hepatic lesions.  Pleurx catheters draining and 100-268mls. No f/c, cough.  Blood glucose poorly controlled. AIc recently was 10.3  Her PCP added metformin but BG in clinic today was 508 after just oatmeal for breakfast and taking her 5U 70/30 insulin.    ROS:  10 systems were reviewed and found to be within normal limits except as described in the HPI.    MEDICATIONS:  Current Outpatient Medications on File Prior to Visit   Medication Sig Dispense Refill   ??? amLODIPine (NORVASC) 10 MG tablet Take 1 tablet (10 mg total) by mouth daily. 90 tablet 3   ??? apixaban (ELIQUIS) 5 mg Tab Take 1 tablet (5 mg total) by mouth Two (2) times a day. 180 tablet 3   ??? blood sugar diagnostic (ACCU-CHEK AVIVA) Strp      ??? crizotinib (XALKORI) 250 mg capsule Take 1 capsule (250 mg total) by mouth Two (2) times a day. 60 capsule 3   ??? flash glucose scanning reader 1 each.     ??? flash glucose sensor kit 3 each.     ??? furosemide (LASIX) 20 MG tablet Take 1 tablet (20 mg total) by mouth daily. (Patient taking differently: Take 20 mg by mouth daily as needed. ) 90 tablet 3   ??? insulin aspart protamine-insulin aspart (NOVOLOG MIX 70-30FLEXPEN U-100) 100 unit/mL (70-30) injection pen TAKE 5 UNITS 30 MINUTES BEFORE BREAKFAST AND 5 UNITS 30 MINUTES BEFORE SUPPER.     ??? lidocaine 4 % patch Place 1  patch on the skin daily.     ??? losartan (COZAAR) 100 MG tablet Take 1 tablet (100 mg total) by mouth daily. 90 tablet 3   ??? metFORMIN (GLUCOPHAGE-XR) 500 MG 24 hr tablet Take 2 tablets (1,000 mg total) by mouth Two (2) times a day. 120 tablet 0   ??? pen needle, diabetic (BD ULTRA-FINE SHORT PEN NEEDLE) 31 gauge x 5/16 Ndle USE AS DIRECTED. USE 4 NEEDLES A DAY WITH DIABETIC MEDICATION PENS AS DIRECTED     ??? potassium chloride SA (K-DUR,KLOR-CON) 20 MEQ tablet Take 1 tablet (20 mEq total) by mouth daily. 90 tablet 1   ??? ranitidine (ZANTAC) 150 MG tablet Take 1 tablet (150 mg total) by mouth Two (2) times a day. 180 tablet 3   ??? simethicone (GAS-X) 80 MG chewable tablet Chew 1 tablet (80 mg total) 4 (four) times a day as needed for flatulence (gas relief). 100 tablet 2   ??? [EXPIRED] traMADol (ULTRAM) 50 mg tablet Take 1 tablet (50 mg total) by mouth every six (6) hours as needed for pain. for up to 5 days 20 tablet 0     No current facility-administered medications on file prior to visit.        ALLERGIES:   Allergies   Allergen Reactions   ??? Sulfasalazine Nausea Only       PMH:  Past Medical History:   Diagnosis Date   ??? Atrial fibrillation (CMS-HCC)    ??? Cancer (CMS-HCC)    ??? Diabetes mellitus (CMS-HCC)    ??? Hypertension    ??? Metastatic lung cancer (metastasis from lung to other site) (CMS-HCC)    ??? Pulmonary embolism (CMS-HCC)        SOCIAL HISTORY:  Lives with her 2 teenage grandsons. Her sons, daughter live nearby.    Never smoker  No alcohol    FAMILY HISTORY:  No hx of cancer in her parents.  Daughter has breast cancer.  Another daughter died of lung cancer and was a heavy smoker for many years.     PHYSICAL EXAMINATION:  BP 100/58  - Pulse 81  - Temp 36.6 ??C (97.9 ??F) (Oral)  - Resp 16  - Wt 51 kg (112 lb 8 oz)  - SpO2 99%  - BMI 19.31 kg/m??     ECOG 1  GENERAL: Thin, pleasant woman not in acute distress  MOUTH: Moist and clear without thrush  NECK: Supple  LYMPH: No cervical or supraclavicular lymphadenopathy  CHEST: CTAB  CV: Nl s1/s2  ABD: Soft, not tender, not distended, no palpable organomegaly  EXTREMITIES: Warm and well perfused x 4 without cyanosis, clubbing or edema  MUSKOLOSKELETAL: No spinal tenderness to palpation  NEURO: CN2-12 intact.  Speech and gait within normal limits  AFFECT: Varied and appropriate to situation  DERM: No lesions noted    LABS:    RADIOLOGY:  MRI Brain 07/19/17  - Evolving small right frontoparietal infarct.  ??- Decreased prominence of right cerebral enhancing lesions. These are favored to represent evolving subcortical infarcts. Metastases are felt less likely.  -  Similar appearance of left frontal calvarial metastasis with extension into the epidural space.  ??- Unchanged left paramedian vertex lesion, favored to represent venous structure. Meningioma felt less likely.    MRI Brain 05/30/17  --Multiple small subacute acute/subacute right cerebral infarcts.  --Numerous small enhancing lesions in the right cerebral hemisphere, some of which are concerning for metastases, while some are more consistent with subacute infarct.  --Left frontal calvarial  metastasis with epidural/dural extension.  --Small dural based lesion at the left paramedian vertex, meningioma or venous structure favored over metastasis.    CTA 07/28/17:  --Unchanged right upper lobe pulmonary emboli. No definite new emboli identified. Prominence of the main pulmonary artery, which can be seen with pulmonary arterial hypertension.  --Interval decreased size of the left upper lobe mass 6.5 x 3.2 cm, previously 9.7 x 4.6 cm . Slight interval decreased size of the right upper lobe opacity, which may reflect evolving infarct and/or pulmonary mass decreasing in size.  --Stable mediastinal lymphadenopathy.  -- Small bilateral pleural effusions, decreased in size from prior, with bilateral pleural drainage catheters in place.  -- Stable to slight interval increased small pericardial effusion.  -- Diffuse subcutaneous edema.  --  Interval decreased size of the left hepatic mass.      CT-C 05/11/17  - Left upper lobe 10 cm mass and right upper lobe 2.7 cm nodular opacity; primary consideration is for malignancy, including primary lung cancer for one or both of these lesions.  - Mediastinal lymphadenopathy, left supraclavicular lymphadenopathy, and large left hepatic lobe mass; primary consideration is for metastatic disease.  - Moderate/large bilateral pleural effusions.  - Small pericardial effusion.    CTA 04/30/17  - Large soft tissue attenuation left paramediastinal/left upper lobe mass with obstruction/invasion of the left upper lobe bronchi concerning for malignancy versus metastatic disease.  - Pulmonary emboli in the right upper lobe segmental and subsegmental pulmonary arteries.  - Irregular consolidative opacity in the right upper lobe with associated segmental pulmonary embolus concerning for focus of infarct. Infection or malignancy additional considerations.  - Large bilateral pleural effusions.  - Large hypoattenuating lesion in the left hepatic lobe incompletely characterized on current study, concerning for mass lesion. Consider CT abdomen pelvis for further characterization.  - Filling defect in the intrahepatic IVC concerning for tumor invasion versus thrombus.      PATHOLOGY:  Final Diagnosis 05/19/17   A: Liver, lesion, needle core biopsy   - Metastatic poorly differentiated carcinoma, consistent with adenocarcinoma of lung origin (see ancillary studies)     NOTE: PDL-1 IHC on liver biopsy still pending    STRATA NGS Liver 05/31/17  MET exon 14  RB1 p.W681  TP53 p.L194F  MSS  TMB ??? Low; Mutations per MB: 7  PD-L1 ??? High; RNA expression score: 100      ASSESSMENT AND PLAN:  44 yoF with Stage IV NSCLC-adenocarcinoma, mets to liver. She is a never smoker.    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 We are still awaiting results of IHC PDL-1 but we reviewed that even if high expression, there is no clear data regarding sequence order of a METi vs IO monotherapy. She is adamant that she is not willing to consider chemotherapy or chemoIO combination as she wants to maintain her QoL preferentially. Started Crizotinib 250mg  BID 06/23/17 and DR to 250mg  QD on 07/17/17 for one week and resumed on 07/24/17 at 250mg  BID again.    Future options would include LCCC trial with herpes virus into pleural space or one of the newer more potent METi (e.g capmatinib, tepotonib) either on trial or if approved at that time.      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  Tolerating it well.  QTc 502 ms on 5/31 is within her range.  Will repeat at next visit.  Brain Metastases: Discussed with RadOnc and the larger lesion is most c/w a meningioma and does not need intervention.  The small punctate ones are too small to treat currently and will monitor.  Given that crizotinib has lower brain penetration, repeat MRI brain 07/19/17 stable.    Hyperglycemia:  BG 508 in clinic today, she took her 70/30 insulin at 7:30am.  Gave 10U short acting lispro in the office. Strongly recommended that she will follow up with her PCP locally again to adjust insulin dose - A1c was 10.3 on 08/01/17    Dysphagia/reflux: Resolved and eating well again.    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  Recommended folic acid daily. Will refer to Benign Hematology if anemia worsens.     Supportive Care: Home Health organized and will assist with management of pleurx catheter.  Still draining 200-363mls bilaterally.    Nutrition: No needs currently    Follow up: RTC in 4 weeks for toxicity check, repeat ECG for HR and QTc monitoring    The patients questions were answered to her satisfaction.     Pt. seen and discussed with Dr. Seward Speck MD  Hematology/Oncology Fellow

## 2017-08-08 NOTE — Unmapped (Signed)
1610:  Labs drawn and sent for analysis.  Care provided by  Will Bonnet.

## 2017-08-10 ENCOUNTER — Ambulatory Visit: Admit: 2017-08-10 | Discharge: 2017-08-10 | Disposition: A | Discharge status: Home / Self Care | Payer: MEDICARE

## 2017-08-10 ENCOUNTER — Emergency Department: Admit: 2017-08-10 | Discharge: 2017-08-10 | Disposition: A | Discharge status: Home / Self Care | Payer: MEDICARE

## 2017-08-10 DIAGNOSIS — R0789 Other chest pain: Principal | ICD-10-CM

## 2017-08-10 DIAGNOSIS — R1310 Dysphagia, unspecified: Secondary | ICD-10-CM

## 2017-08-10 DIAGNOSIS — I10 Essential (primary) hypertension: Secondary | ICD-10-CM | POA: Diagnosis not present

## 2017-08-10 DIAGNOSIS — I4891 Unspecified atrial fibrillation: Secondary | ICD-10-CM | POA: Diagnosis not present

## 2017-08-10 DIAGNOSIS — R079 Chest pain, unspecified: Secondary | ICD-10-CM | POA: Diagnosis not present

## 2017-08-10 DIAGNOSIS — Z79899 Other long term (current) drug therapy: Secondary | ICD-10-CM | POA: Diagnosis not present

## 2017-08-10 DIAGNOSIS — I313 Pericardial effusion (noninflammatory): Secondary | ICD-10-CM | POA: Diagnosis not present

## 2017-08-10 DIAGNOSIS — C799 Secondary malignant neoplasm of unspecified site: Secondary | ICD-10-CM | POA: Diagnosis not present

## 2017-08-10 DIAGNOSIS — Z882 Allergy status to sulfonamides status: Secondary | ICD-10-CM | POA: Diagnosis not present

## 2017-08-10 DIAGNOSIS — E119 Type 2 diabetes mellitus without complications: Secondary | ICD-10-CM | POA: Diagnosis not present

## 2017-08-10 DIAGNOSIS — R110 Nausea: Secondary | ICD-10-CM | POA: Diagnosis not present

## 2017-08-10 DIAGNOSIS — J90 Pleural effusion, not elsewhere classified: Secondary | ICD-10-CM | POA: Diagnosis not present

## 2017-08-10 DIAGNOSIS — Z4682 Encounter for fitting and adjustment of non-vascular catheter: Secondary | ICD-10-CM | POA: Diagnosis not present

## 2017-08-10 DIAGNOSIS — Z9049 Acquired absence of other specified parts of digestive tract: Secondary | ICD-10-CM | POA: Diagnosis not present

## 2017-08-10 DIAGNOSIS — R072 Precordial pain: Secondary | ICD-10-CM | POA: Diagnosis not present

## 2017-08-10 DIAGNOSIS — C3490 Malignant neoplasm of unspecified part of unspecified bronchus or lung: Secondary | ICD-10-CM | POA: Diagnosis not present

## 2017-08-10 DIAGNOSIS — E042 Nontoxic multinodular goiter: Secondary | ICD-10-CM | POA: Diagnosis not present

## 2017-08-10 DIAGNOSIS — R918 Other nonspecific abnormal finding of lung field: Secondary | ICD-10-CM | POA: Diagnosis not present

## 2017-08-10 DIAGNOSIS — M439 Deforming dorsopathy, unspecified: Secondary | ICD-10-CM | POA: Diagnosis not present

## 2017-08-10 LAB — CBC W/ AUTO DIFF
BASOPHILS ABSOLUTE COUNT: 0 10*9/L (ref 0.0–0.1)
EOSINOPHILS ABSOLUTE COUNT: 0.2 10*9/L (ref 0.0–0.4)
HEMATOCRIT: 29 % — ABNORMAL LOW (ref 36.0–46.0)
HEMOGLOBIN: 10.4 g/dL — ABNORMAL LOW (ref 13.5–16.0)
LARGE UNSTAINED CELLS: 2 % (ref 0–4)
LYMPHOCYTES ABSOLUTE COUNT: 1 10*9/L — ABNORMAL LOW (ref 1.5–5.0)
LYMPHOCYTES RELATIVE PERCENT: 11.1 %
MEAN CORPUSCULAR HEMOGLOBIN CONC: 36 g/dL (ref 31.0–37.0)
MEAN CORPUSCULAR HEMOGLOBIN: 22.4 pg — ABNORMAL LOW (ref 26.0–34.0)
MEAN CORPUSCULAR VOLUME: 62.2 fL — ABNORMAL LOW (ref 80.0–100.0)
MEAN PLATELET VOLUME: 7.5 fL (ref 7.0–10.0)
MONOCYTES ABSOLUTE COUNT: 0.4 10*9/L (ref 0.2–0.8)
MONOCYTES RELATIVE PERCENT: 4.5 %
NEUTROPHILS ABSOLUTE COUNT: 6.9 10*9/L (ref 2.0–7.5)
NEUTROPHILS RELATIVE PERCENT: 80.6 %
PLATELET COUNT: 314 10*9/L (ref 150–440)
RED BLOOD CELL COUNT: 4.66 10*12/L (ref 4.00–5.20)
RED CELL DISTRIBUTION WIDTH: 19.8 % — ABNORMAL HIGH (ref 12.0–15.0)
WBC ADJUSTED: 8.6 10*9/L (ref 4.5–11.0)

## 2017-08-10 LAB — COMPREHENSIVE METABOLIC PANEL
ALBUMIN: 2.4 g/dL — ABNORMAL LOW (ref 3.5–5.0)
ALKALINE PHOSPHATASE: 146 U/L — ABNORMAL HIGH (ref 38–126)
ALT (SGPT): 30 U/L (ref 15–48)
AST (SGOT): 19 U/L (ref 14–38)
BLOOD UREA NITROGEN: 24 mg/dL — ABNORMAL HIGH (ref 7–21)
BUN / CREAT RATIO: 26
CALCIUM: 8.6 mg/dL (ref 8.5–10.2)
CHLORIDE: 102 mmol/L (ref 98–107)
CO2: 29 mmol/L (ref 22.0–30.0)
CREATININE: 0.91 mg/dL (ref 0.60–1.00)
EGFR MDRD AF AMER: >=60 mL/min/{1.73_m2} (ref >=60)
EGFR MDRD NON AF AMER: 59 mL/min/{1.73_m2} — ABNORMAL LOW (ref >=60)
GLUCOSE RANDOM: 238 mg/dL — ABNORMAL HIGH (ref 65–179)
POTASSIUM: 3.5 mmol/L (ref 3.5–5.0)
PROTEIN TOTAL: 5.5 g/dL — ABNORMAL LOW (ref 6.5–8.3)
SODIUM: 136 mmol/L (ref 135–145)

## 2017-08-10 LAB — BUN / CREAT RATIO: Urea nitrogen/Creatinine:MRto:Pt:Ser/Plas:Qn:: 26

## 2017-08-10 LAB — SLIDE REVIEW

## 2017-08-10 LAB — MEAN CORPUSCULAR VOLUME: Lab: 62.2 — ABNORMAL LOW

## 2017-08-10 LAB — PROTIME: Lab: 16.9 — ABNORMAL HIGH

## 2017-08-10 LAB — TARGET CELLS

## 2017-08-10 LAB — TROPONIN I
Troponin I.cardiac:MCnc:Pt:Ser/Plas:Qn:: <0.06
Troponin I.cardiac:MCnc:Pt:Ser/Plas:Qn:: <0.06

## 2017-08-10 LAB — PROTIME-INR: PROTIME: 16.9 s — ABNORMAL HIGH (ref 10.2–12.8)

## 2017-08-10 LAB — APTT: Coagulation surface induced:Time:Pt:PPP:Qn:Coag: 37.9

## 2017-08-10 NOTE — Unmapped (Signed)
No drainage noted to dressings.

## 2017-08-10 NOTE — Unmapped (Signed)
Son (at bedside) sts pt had oncology appt yesterday with reports of improved results stating tumor has gotten smaller.  Pt in NAD at this time, breathing even-nonlabored. Reports sudden on set of cp lastnight. Describes at heaviness midsternal. Associated with intermittent sob. Denies another other sx at this time.

## 2017-08-10 NOTE — Unmapped (Signed)
Pt presents to ER alert and in NAD. Pt reports she is having chest tightness that started at 10pm, reports intermittent SOB.

## 2017-08-10 NOTE — Unmapped (Signed)
Patient rounds completed. The following patient needs were addressed:  Toileting . Assisted patient to toilet.

## 2017-08-10 NOTE — Unmapped (Signed)
[facility]  Provider Note      ED Clinical Impression     Final diagnoses:   Chest pain, unspecified type (Primary)   Pleural effusion       Initial Impression, ED Course, Assessment and Plan     Impression: Chest tightness and SOB. Will r/o ACS, increasing Plueral effusion, pericardial effusion, potential recurrent PE. Plan for CXR, labs, bedside US, and possible CTA chest.     Patient was seen for similar complaint on 07/28/17, where she underwent a CTA chest and troponin x 2. These were overall reassuring/stable. Today, her chest tightness is a bit vague an also complains of some trouble swallowing (which she is seeing ENT in 1 week). Her chest pain would be quite atypical for ACS/angina and I think she has a much more likely cause of her recurrent chest pressure (lung cancer with PE, with bilateral pleural effusions with bilateral chest tubes).  Her repeat CT scan today shows stable PE, chest tubes in place, and interval increase in her bilateral pleural effusions. However, her son states that she is due for chest tube drainage in the morning and, in before her prior CT, she had just drained the pleural effusions. Tonight, we will repeat her troponin and give a GI cocktail. If the second troponin is negative, will dc to follo wup with her doctor. I discussed with her son that a cardiac stress test may be warranted, however, her symptoms are more likely caused by her pulmonary processes. Her vitals signs are stable- 02 sat is 97% on RA, EKG without specific changes from 5/30 and 5/14.       Additional Medical Decision Making     I have reviewed the vital signs and the nursing notes. Labs and radiology results that were available during my care of the patient were independently reviewed by me and considered in my medical decision making. I directly visualized and independently interpreted the EKG tracing. I reviewed the patient's prior medical records.     I independently visualized the radiology images.         History     Chief Complaint  Chest Tightness       HPI   [name] [name] is a 84 y.o. female with a PMH of metastatic lung cancer (on Crizotinib), bilateral pleural effusions with bilateral Pleurx catheters, DM, HTN, A-fib (on Eliquis), and PE who presents with chest tightness that began this afternoon. The patient endorses the sudden onset of substernal, non-radiating chest pressure, with associated nausea this afternoon. Her nausea has resolved, but her chest tightness has persisted. She took gas-x and tums without relief today. She denies any vomiting, diaphoresis, or BLE edema.     Past Medical History:   Diagnosis Date   ??? Atrial fibrillation (CMS-HCC)    ??? Cancer (CMS-HCC)    ??? Diabetes mellitus (CMS-HCC)    ??? Hypertension    ??? Metastatic lung cancer (metastasis from lung to other site) (CMS-HCC)    ??? Pulmonary embolism (CMS-HCC)        Patient Active Problem List   Diagnosis   ??? SOB (shortness of breath)   ??? Hypoxemia   ??? Acute pulmonary embolism (CMS-HCC)   ??? HTN (hypertension)   ??? Diabetes mellitus, type 2 (CMS-HCC)   ??? Metastatic lung cancer (metastasis from lung to other site), unspecified laterality (CMS-HCC)   ??? Addison anemia   ??? Lipoma of back   ??? Weight loss   ??? Lump of breast, right   ???  Pernicious anemia   ??? Weight loss, abnormal   ??? Anemia   ??? Need for shingles vaccine   ??? Pleural effusion   ??? Chest pain on breathing   ??? T12 compression fracture (CMS-HCC)   ??? Dyspepsia       Past Surgical History:   Procedure Laterality Date   ??? CHG RAD GUIDED,PERCUT DRAINAGE,W/CATH PLACE Right 06/19/2017    Procedure: RADIOLOGICAL GUIDANCE, FOR PERCUTANEOUS DRAINAGE, WITH PLACEMENT OF CATHETER, RAD S&I;  Surgeon: Sherwood Gambler, MD;  Location: BRON[facility] PROCEDURE LAB [facility];  Service: Pulmonary   ??? CHOLECYSTECTOMY     ??? HYSTERECTOMY     ??? neck surgey     ??? PR INSERTION INDWELLING TUNNELED PLEURAL CATHETER Bilateral 06/19/2017    Procedure: INSERTION OF INDWELLING TUNNELED PLEURAL CATHETER WITH CUFF WITH MODERATE SEDATION;  Surgeon: Sherwood Gambler, MD;  Location: [facility]ON[facility] PROCEDURE LAB [facility];  Service: Pulmonary   ??? PR MOD SED SAME PHYS/QHP EACH ADDL 15 MINS  06/19/2017    Procedure: MODERATE SEDATION SERVICES PROVIDED BY SAME PHYSICIAN OR OTHER QUALIFIED HEALTH CARE PROFESSIONAL PERFORMING THE DIAGNOSTIC OR THERAPEUTIC SERVICE THAT SEDATION SUPPORTS, EACH ADDITIONAL 15 MINS;  Surgeon: Sherwood Gambler, MD;  Location: BRON[facility] PROCEDURE LAB [facility];  Service: Pulmonary   ??? PR MOD SED SAME PHYS/QHP INITIAL 15 MINS 5/> YRS  06/19/2017    Procedure: MODERATE SEDATION SERVICES PROVIDED BY SAME PHYSICIAN OR OTHER QUALIFIED HC PROFESSIONAL PERFORMING THE DIAGNOSTIC OR THERAPEUTIC SERVICE THAT SEDATION SUPPORTS, INIT 15 MINS, PT AGE 35 YEARS OR OLDER;  Surgeon: Sherwood Gambler, MD;  Location: BRONCH PROCEDURE LAB [facility];  Service: Pulmonary       No current facility-administered medications for this encounter.     Current Outpatient Medications:   ???  amLODIPine (NORVASC) 10 MG tablet, Take 1 tablet (10 mg total) by mouth daily., Disp: 90 tablet, Rfl: 3  ???  apixaban (ELIQUIS) 5 mg Tab, Take 1 tablet (5 mg total) by mouth Two (2) times a day., Disp: 180 tablet, Rfl: 3  ???  blood sugar diagnostic (ACCU-CHEK AVIVA) Strp, , Disp: , Rfl:   ???  calcium carbonate-vitamin D3 (CALTRATE 600 + D) 600 mg (1,500 mg)-800 unit Chew, Chew 1 tablet Two (2) times a day., Disp: , Rfl:   ???  crizotinib (XALKORI) 250 mg capsule, Take 1 capsule (250 mg total) by mouth Two (2) times a day., Disp: 60 capsule, Rfl: 3  ???  flash glucose scanning reader, 1 each., Disp: , Rfl:   ???  flash glucose sensor kit, 3 each., Disp: , Rfl:   ???  furosemide (LASIX) 20 MG tablet, Take 20 mg by mouth daily as needed for swelling., Disp: , Rfl:   ???  insulin aspart prot/insuln asp (NOVOLOG MIX 70-30 FLEXPEN SUBQ), Inject 5 Units under the skin Two (2) times a day (30 minutes before a meal)., Disp: , Rfl:   ???  lidocaine 4 % patch, Place 1 patch on the skin daily., Disp: , Rfl:   ???  losartan (COZAAR) 100 MG tablet, Take 1 tablet (100 mg total) by mouth daily., Disp: 90 tablet, Rfl: 3  ???  metFORMIN (GLUCOPHAGE-XR) 500 MG 24 hr tablet, Take 2 tablets (1,000 mg total) by mouth Two (2) times a day., Disp: 120 tablet, Rfl: 0  ???  pen needle, diabetic (BD ULTRA-FINE SHORT PEN NEEDLE) 31 gauge x 5/16 Ndle, USE AS DIRECTED. USE 4 NEEDLES A DAY WITH DIABETIC MEDICATION PENS AS DIRECTED, Disp: , Rfl:   ???  potassium chloride SA (K-DUR,KLOR-CON) 20 MEQ tablet, Take 1 tablet (20 mEq total) by mouth daily., Disp: 90 tablet, Rfl: 1  ???  ranitidine (ZANTAC) 150 MG tablet, Take 1 tablet (150 mg total) by mouth Two (2) times a day., Disp: 180 tablet, Rfl: 3  ???  simethicone (GAS-X) 80 MG chewable tablet, Chew 1 tablet (80 mg total) 4 (four) times a day as needed for flatulence (gas relief)., Disp: 100 tablet, Rfl: 2    Allergies  Sulfasalazine    Family History   Problem Relation Age of Onset   ??? Cancer Brother         lung   ??? Cancer Daughter         lung   ??? Cancer Daughter         breast       Social History  Social History     Tobacco Use   ??? Smoking status: Never Smoker   ??? Smokeless tobacco: Never Used   Substance Use Topics   ??? Alcohol use: No   ??? Drug use: Never         Review of Systems   Constitutional: Negative for fever or chills.   Respiratory: Negative for cough or shortness of breath.   Cardiovascular: Positive for chest pain.   Gastrointestinal: Positive for nausea. Negative for vomiting or diarrhea.   Genitourinary: Negative for difficulty urinating.   Musculoskeletal: Negative for myalgias.   Skin: Negative for rash.   Neurological: Negative for headaches.   Hematological: Negative for bruising.    All other systems reviewed and are negative    Physical Exam     VITAL SIGNS:    Vitals:    08/10/17 0013   BP: 116/74   Pulse: 94   Resp: 15   Temp: 36.6 ??C (97.8 ??F)   SpO2: 92%       Physical Examination: General appearance - nontoxic appearance  Eyes - extraocular eye movements intact  Mouth - mucous membranes moist, pharynx normal without lesions  Neck - Supple, normal range of motion  Chest - Normal respiratory effort,clear to auscultation bilaterally, bilateral chest tubes in place   Heart - normal rate and regular rhythm, S1 and S2 normal, no murmurs noted  Abdomen - soft, nontender, nondistended, no peritoneal signs  Neurological - alert, oriented, normal speech, no focal motor findings  Extremities - peripheral pulses normal, no pedal edema  Skin - normal coloration and turgor, no rash      EKG     Sinus rhythm with RBBB    Radiology       CTA Chest W Contrast   Final Result   Addendum 1 of 1   ATTENDING ADDENDUM:      The presumed supraclavicular lymphadenopathy is heterogeneous left thyroid    nodule. Recommend dedicated ultrasound thyroid for further assessment.      Final      XR Chest 2 views   Final Result      No new consolidation. Unchanged appearance of bilateral masses.      Left chest tube has been adjusted and terminates at the apex. Unchanged right chest tube.      ADDENDUM:      Compression deformity in lower thoracic spine.      note: CTA addendums added after patient discharge.     Laboratory Data ordered/reviewed     Labs Reviewed   EXTRA TUBES    Narrative:     The following orders were created  for panel order ED Extra Tubes.  Procedure                               Abnormality         Status                     ---------                               -----------         ------                     LAVENDER EDTA EXTRA ZOXW[[id number]]                                                   LIGHT BLUE CITRATE EXTR.Marland KitchenMarland Kitchen[[id number]]                                                 ORANGE SST EXTRA OZHY[[id number]]                                                        Please view results for these tests on the individual orders.   LAVENDER EDTA EXTRA TUBE   LIGHT BLUE CITRATE EXTRA TUBE   ORANGE SST EXTRA TUBE       Documentation assistance was provided by Geroge Baseman, Scribe, on August 10, 2017 at 12:26 AM for Arliss Journey, DO.    A scribe was used when documenting this visit. I agree with the above documentation. Signed by  Marlan Palau on  August 12, 2017 at 5:23 PM                      Jones Skene, DO  08/12/17 1723

## 2017-08-10 NOTE — Unmapped (Signed)
Delirium Triage Screening (DTS) performed. Patient screened negative.

## 2017-08-10 NOTE — Unmapped (Signed)
MD at bedside. 

## 2017-08-10 NOTE — Unmapped (Signed)
Pt escorted to RM 7 via wheelchair. EKG complete, MD has seen. Pt vitals taken and normal. Pt changed into a gown and placed on cardiac monitor. Pt seems to be in-and-out of resting. Pt will call if any needs arise.

## 2017-08-13 NOTE — Unmapped (Signed)
ED Progress Note    Addendum over-read of CTA scan: thyroid nodules and reccs for ultrasound. I have messaged Dr. Allena Katz, [facility] to request follow up on this.

## 2017-08-14 NOTE — Unmapped (Signed)
[facility] Refill Coordination Note  Specialty Medication(s): Rise Mu  Additional Medications shipped: n/a    [name], DOB: 1931-02-02  Phone: [phone] (home) [phone] (work), Alternate phone contact: N/A  Phone or address changes today?: No  All above HIPAA information was verified with patient's family member.  Shipping Address: [address] [name]  [place] [address]   Insurance changes? No    Completed refill call assessment today to schedule patient's medication shipment from the [facility] [phone]).      Confirmed the medication and dosage are correct and have not changed: Yes, regimen is correct and unchanged.    Confirmed patient started or stopped the following medications in the past month:  No, there are no changes reported at this time.    Are you tolerating your medication?:  [name] reports tolerating the medication.    ADHERENCE    Is this medicine transplant or covered by Medicare Part B? Yes.    10 days left    Did you miss any doses in the past 4 weeks? No missed doses reported.    FINANCIAL/SHIPPING    Delivery Scheduled: Yes, Expected medication delivery date: 08/21/17     The patient will receive an FSI print out for each medication shipped and additional FDA Medication Guides as required.  Patient education from [facility] or Robet Leu may also be included in the shipment.    [name] did not have any additional questions at this time.    Delivery address validated in FSI scheduling system: Yes, address listed in FSI is correct.    We will follow up with patient monthly for standard refill processing and delivery.      Thank you,  Sweet Jarvis Vangie Bicker   [facility] Specialty Pharmacist

## 2017-08-17 NOTE — Unmapped (Signed)
Hem/Onc Phone Triage Note    Reason for Call:   [name] [name] is a 84 y.o. 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 Her son called to report that she was experiencing more weak spells. He describes these as getting weak at the legs whenever she stands up and collapses. These have been ongoing since may 2019 when they occurred once every 2 weeks. It appears they are more frequent now occurring several times per day. I explicit asked about symptoms of convulsions including ANY alteration of consciousness, jerking movement of extremities, abnormal posturing or eye rolling. [name] was clear that these episodes are not seizure - like and more like postural instability. Ddx favored include postural hypotension, hyperglycemia (or hypoglycemia). I observed that the patient recently had a BG of 500. [name] confirmed that hyper- and hypo-glycemia remain and issue. I advised that a) he ensures [name] is well hydrated with plain water goal 64 oz in 24 hours at the very least, b) check random blood glucose and if critically high ( > 450) or low (< 70) to take her to the ER and c) connect with the PCP in the AM for an urgent visit to triage this symptoms and glycemic control.     Red flag symptoms for acute intracranial or cardiopulmonary process were reviewed and [name] knows to take her mom to the ER if these occur.     Fellow Taking Call:  Kenyon Ana, MD  August 16, 2017 9:20 PM

## 2017-08-18 MED FILL — XALKORI/250MG/CAPS: XALKORI/250MG/CAPS | 30 days supply | Qty: 60 | Fill #2

## 2017-08-22 ENCOUNTER — Ambulatory Visit: Admit: 2017-08-22 | Discharge: 2017-08-23 | Payer: MEDICARE | Attending: Family Medicine | Primary: Family Medicine

## 2017-08-22 DIAGNOSIS — Z794 Long term (current) use of insulin: Secondary | ICD-10-CM

## 2017-08-22 DIAGNOSIS — E041 Nontoxic single thyroid nodule: Secondary | ICD-10-CM

## 2017-08-22 DIAGNOSIS — I10 Essential (primary) hypertension: Secondary | ICD-10-CM

## 2017-08-22 DIAGNOSIS — E119 Type 2 diabetes mellitus without complications: Principal | ICD-10-CM

## 2017-08-22 MED ORDER — AMLODIPINE 5 MG TABLET
ORAL_TABLET | Freq: Every day | ORAL | 0 refills | 0 days | Status: CP
Start: 2017-08-22 — End: 2017-09-26

## 2017-08-22 NOTE — Unmapped (Signed)
Stable/controlled.  Continue losartan 100mg , lasix prn, and kdur  DECREASE amlodipine to 5mg  daily due to weakness, LH, lethargy.    BP Readings from Last 3 Encounters:   08/22/17 105/65   08/10/17 112/66   08/08/17 100/58

## 2017-08-22 NOTE — Unmapped (Addendum)
Borderline controlled; improving. Home BGs running 150-200 in AM with spikes in afternoon.   Continue metformin 1000 mg BID; take with food.   INCREASE AM novolog from 5U to 10u QAM and continue 10U QPM which the pt increased on own; increase nightly dose by 2U weekly for AM BG >150; increase morning dose by 2U weekly for PM BG >150.  Continue checking blood sugars and bring in log to f/u visit.   Lab Results   Component Value Date    A1C 9.6 (A) 08/22/2017    A1C 10.3 (A) 08/01/2017    A1C 8.1 (H) 05/19/2017

## 2017-08-22 NOTE — Unmapped (Signed)
Assessment and Plan  [name] [name] is a 84 y.o. female with a PMH as above who presents to the clinic for the following:    Problem List Items Addressed This Visit     HTN (hypertension)     Stable/controlled.  Continue losartan 100mg , lasix prn, and kdur  DECREASE amlodipine to 5mg  daily due to weakness, LH, lethargy.    BP Readings from Last 3 Encounters:   08/22/17 105/65   08/10/17 112/66   08/08/17 100/58            Relevant Medications    amLODIPine (NORVASC) 5 MG tablet    Diabetes mellitus, type 2 (CMS-HCC) - Primary     Borderline controlled; improving. Home BGs running 150-200 in AM with spikes in afternoon.   Continue metformin 1000 mg BID; take with food.   INCREASE AM novolog from 5U to 10u QAM and continue 10U QPM which the pt increased on own; increase nightly dose by 2U weekly for AM BG >150; increase morning dose by 2U weekly for PM BG >150.  Continue checking blood sugars and bring in log to f/u visit.   Lab Results   Component Value Date    A1C 9.6 (A) 08/22/2017    A1C 10.3 (A) 08/01/2017    A1C 8.1 (H) 05/19/2017             Relevant Orders    POCT glycosylated hemoglobin (Hb A1C) (Completed)    Thyroid nodule     08/10/17 chest CT:  The presumed supraclavicular lymphadenopathy is heterogenous left thyroid nodule. Recommend dedicated ultrasound thyroid for further assessment.   Thyroid US ordered today.         Relevant Orders    US Thyroid          Return in about 1 month (around 09/19/2017), or if symptoms worsen or fail to improve, for DM2, htn.    [name]  08/22/2017    Chief Complaint  Chief Complaint   Patient presents with   ??? Medication Management     possible increase in novalog       HPI  [name] [name] is a 84 y.o. female with a PMH as below who presents for the following:    HTN  Currently taking amlodipine 10mg , losartan 100mg , lasix 20mg  prn and potassium supplement.     DM2  -Pts was previously followed by endo at [facility].  -Son states pts daily BS have been spiking in the afternoons.  - AM sugars now 150-200 with increase in PM insulin to 10U.   -Pt has been on insulin for 1-2 years. She has been using insulin 5QAM and 10QPM.  - Son reports some sporadic episodes of shaking, increased in frequency from once per month to once per week. Pt reports some lightheadedness, lethargy, leg weakness.  Lab Results   Component Value Date    A1C 9.6 (A) 08/22/2017       Chest pain  Pt reports chest pain from ED visit has resolved.     Thoracic compression fracture  Pt reports having occassional back pain.She currently takes 1000mg  otc Tylenol and Salonpas for pain relief.     Acute pulmonary embolism  Emboli in right upper lobe segmental and subsegmental arteries 04/30/17, taking apixaban BID.     Metastatic lung cancer  Left upper lung 10cm mass found 04/30/17 with 2.7cm right upper lobe nodule. With mediastinal lymphadenopathy and hepatic lobe mass. Liver lesion biopsied 3/21  confirmed adenocarcinoma of lung.   - upcoming f/u for pleural effusion. Stop eliquis as recommend.  - No cardiopulm complaints - admission sxs of CP/DOE have resolved      Last OV  Anemia  Son reports pt has hx of anemia; no iron studies on file.     Past Medical History:   Diagnosis Date   ??? Atrial fibrillation (CMS-HCC)    ??? Cancer (CMS-HCC)    ??? Diabetes mellitus (CMS-HCC)    ??? Hypertension    ??? Metastatic lung cancer (metastasis from lung to other site) (CMS-HCC)    ??? Pulmonary embolism (CMS-HCC)      Current Outpatient Medications on File Prior to Visit   Medication Sig Dispense Refill   ??? apixaban (ELIQUIS) 5 mg Tab Take 1 tablet (5 mg total) by mouth Two (2) times a day. 180 tablet 3   ??? blood sugar diagnostic (ACCU-CHEK AVIVA) Strp      ??? calcium carbonate-vitamin D3 (CALTRATE 600 + D) 600 mg (1,500 mg)-800 unit Chew Chew 1 tablet Two (2) times a day.     ??? crizotinib (XALKORI) 250 mg capsule Take 1 capsule (250 mg total) by mouth Two (2) times a day. 60 capsule 3   ??? flash glucose scanning reader 1 each.     ??? flash glucose sensor kit 3 each.     ??? furosemide (LASIX) 20 MG tablet Take 20 mg by mouth daily as needed for swelling.     ??? insulin aspart prot/insuln asp (NOVOLOG MIX 70-30 FLEXPEN SUBQ) Inject 10 Units under the skin Two (2) times a day (30 minutes before a meal).     ??? lidocaine 4 % patch Place 1 patch on the skin daily.     ??? losartan (COZAAR) 100 MG tablet Take 1 tablet (100 mg total) by mouth daily. 90 tablet 3   ??? metFORMIN (GLUCOPHAGE-XR) 500 MG 24 hr tablet Take 2 tablets (1,000 mg total) by mouth Two (2) times a day. 120 tablet 0   ??? pen needle, diabetic (BD ULTRA-FINE SHORT PEN NEEDLE) 31 gauge x 5/16 Ndle USE AS DIRECTED. USE 4 NEEDLES A DAY WITH DIABETIC MEDICATION PENS AS DIRECTED     ??? potassium chloride SA (K-DUR,KLOR-CON) 20 MEQ tablet Take 1 tablet (20 mEq total) by mouth daily. 90 tablet 1   ??? ranitidine (ZANTAC) 150 MG tablet Take 1 tablet (150 mg total) by mouth Two (2) times a day. 180 tablet 3   ??? simethicone (GAS-X) 80 MG chewable tablet Chew 1 tablet (80 mg total) 4 (four) times a day as needed for flatulence (gas relief). 100 tablet 2   ??? traMADol (ULTRAM) 50 mg tablet Take 1 tablet (50 mg total) by mouth every six (6) hours as needed for pain. for up to 5 days 20 tablet 0     No current facility-administered medications on file prior to visit.      Social History     Socioeconomic History   ??? Marital status: Widowed     Spouse name: Not on file   ??? Number of children: Not on file   ??? Years of education: Not on file   ??? Highest education level: Not on file   Occupational History   ??? Not on file   Social Needs   ??? Financial resource strain: Not on file   ??? Food insecurity:     Worry: Not on file     Inability: Not on file   ??? Transportation  needs:     Medical: Not on file     Non-medical: Not on file   Tobacco Use   ??? Smoking status: Never Smoker   ??? Smokeless tobacco: Never Used   Substance and Sexual Activity   ??? Alcohol use: No   ??? Drug use: Never ??? Sexual activity: Not on file   Lifestyle   ??? Physical activity:     Days per week: Not on file     Minutes per session: Not on file   ??? Stress: Not on file   Relationships   ??? Social connections:     Talks on phone: Not on file     Gets together: Not on file     Attends religious service: Not on file     Active member of club or organization: Not on file     Attends meetings of clubs or organizations: Not on file     Relationship status: Not on file   Other Topics Concern   ??? Do you use sunscreen? No   ??? Tanning bed use? No   ??? Are you easily burned? No   ??? Excessive sun exposure? No   ??? Blistering sunburns? No   Social History Narrative   ??? Not on file       The following portions of the patient's history were reviewed and updated as appropriate: allergies, current medications, past family history, past medical history, past social history, past surgical history and problem list.    Review of Systems   Comprehensive ROS negative except above    PHYSICAL EXAM  BP 105/65  - Pulse 90  - Temp 36.4 ??C (97.5 ??F) (Oral)  - Resp 18  - Ht 162.6 cm (5' 4.02)  - Wt 51.7 kg (114 lb)  - BMI 19.56 kg/m??   General appearance: alert, cooperative, NAD   Head: normocephalic and atraumatic  Eyes: normal EOMs, PERRL, normal conjunctiva, no discharge  ENT: OP clear and moist, no tonsillar exudates or erythema, normal TMs and external ear canals, normal nasal passages  Neck: normal ROM, supple, no thyromegaly, no cervical/supraclavicular lymphadenopathy  Lungs: normal work of breathing, good air entry and upper lobes and clear to auscultation bilaterally, decreased bibasilar breath sounds. no wheezes or crackles appreciated   Heart: regular rate and rhythm, S1, S2 normal, no murmur, click, rub or gallop   Abdomen: Abdomen soft, non-tender, non distended. No masses, no organomegaly. Bowel sounds present.  Extremities: extremities normal, atraumatic, no cyanosis or edema  NEURO: AAOx3, appropriately responds to questions, spontaneous movement of extremities  Psych: normal mood and affect    [facility] Components:     Barriers to goals identified and addressed. Pertinent handouts were given today and reviewed with the patient as indicated.  The Care Plan and Self-Management goals have been included on the AVS and the AVS has been printed. Any outside resources or referrals needed at this time are noted above. Patient's current medications have been reviewed. Any new medications prescribed have been discussed, and side effects have been addressed. Have assessed the patient's understanding, response, and barriers to adherence to medications. Patient voiced understanding and all questions have been answered to satisfaction.     I attest that I, Gwenlyn Fudge, personally documented this note while acting as scribe for Dr. Jarvis Newcomer B. Allena Katz, MD.   Gwenlyn Fudge, Scribe  08/22/2017 10:19 AM

## 2017-08-22 NOTE — Unmapped (Signed)
Foot Exam:   Monofilament: Diminished sensation?   LEFT:     RIGHT:   1. yes                         1. no            2. no   2. no    3. yes    3. no    4. yes   4. no       Pulses present:  Posterior tibial pulse - behind the ankle bone     LEFT:  yes RIGHT:yes     Dorsalis pedis pulse - on top of the foot, just below the ankle     LEFT: yes RIGHT: yes  Nails: Cracked, thickened, or discolored yes  Skin:Are there any areas of gross deformity, redness, blistering, callus, or broken skin areas noted?     LEFT:     RIGHT:    1. no                         1. no             2. no    2. no     3. no    3. no     4. no   4. no     5. no     5. no  6. Between the toesno 6. Between the toes no

## 2017-08-22 NOTE — Unmapped (Signed)
08/10/17 chest CT:  The presumed supraclavicular lymphadenopathy is heterogenous left thyroid nodule. Recommend dedicated ultrasound thyroid for further assessment.   Thyroid US ordered today.

## 2017-08-24 MED ORDER — METFORMIN ER 500 MG TABLET,EXTENDED RELEASE 24 HR
ORAL_TABLET | Freq: Two times a day (BID) | ORAL | 3 refills | 0.00000 days | Status: CP
Start: 2017-08-24 — End: 2018-08-24

## 2017-08-24 NOTE — Unmapped (Deleted)
HPI:  [name] [name] is an 84 yo F, never smoker, who initially became symptomatic  with dyspnea and cough in late Feb 2019.   History as follows:  Oncology History    - 04/30/17: Presented to ED with dyspnea for over 2 weeks.  CTA with PEs, b/l pleural effusions, mediastinal soft tissue mass 9/5/7cm, invading LUL nd 8.2cm lesion inleft hepatic lobe  - 05/17/17: b/l thoracentesis with 2L removed each lung.  Cytology with no malignant cells but lymphocyte predominant exudative effusiions  - 05/18/17: Biopsy liver lesion c/w lung adenocarcinoma        She lives with her her 2 teenage grandsons and has attentive sons/daughter close by.  Her daughter-in-law has been sleeping overnight with her since diagnosis.  She dresses herself, showers, able to do ADLs but family does most of cooking, grocery shopping and cleaning.  Her son manages her medicines and finances. She is otherwise feeling reasonable well.  Has some fatigue. No falls. Does not use a walker or cane.  She is able to walk around [place] but after exertion like this, has some chest pressure and has to sit down to rest, with resolution usually by .  No stairs at home.  She has insulin dependent DM, HTN and Afib, controlled with meds.      Interval Hx  Initiated crizotinib on 06/23/17 and dose reduced to 250mg  QD on 07/17/17 and then went back up again to BID on 07/24/17.  *** Has generalized fatigue.  No vision changes.   Had an ED visit 07/28/17  for chest/back pain after draining PleurX at home, no PE on CTA and did demonstrate reduction in size of LUL, RUL and hepatic lesions.  Pleurx catheters draining and 100-228mls. No f/c, cough.  Blood glucose poorly controlled. AIc recently was 10.3  Her PCP added metformin but BG in clinic today was 508 after just oatmeal for breakfast and taking her 5U 70/30 insulin.    ROS:  10 systems were reviewed and found to be within normal limits except as described in the HPI.    MEDICATIONS:  Current Outpatient Medications on File Prior to Visit   Medication Sig Dispense Refill   ??? amLODIPine (NORVASC) 5 MG tablet Take 1 tablet (5 mg total) by mouth daily. 90 tablet 0   ??? apixaban (ELIQUIS) 5 mg Tab Take 1 tablet (5 mg total) by mouth Two (2) times a day. 180 tablet 3   ??? blood sugar diagnostic (ACCU-CHEK AVIVA) Strp      ??? calcium carbonate-vitamin D3 (CALTRATE 600 + D) 600 mg (1,500 mg)-800 unit Chew Chew 1 tablet Two (2) times a day.     ??? crizotinib (XALKORI) 250 mg capsule Take 1 capsule (250 mg total) by mouth Two (2) times a day. 60 capsule 3   ??? flash glucose scanning reader 1 each.     ??? flash glucose sensor kit 3 each.     ??? furosemide (LASIX) 20 MG tablet Take 20 mg by mouth daily as needed for swelling.     ??? insulin aspart prot/insuln asp (NOVOLOG MIX 70-30 FLEXPEN SUBQ) Inject 10 Units under the skin Two (2) times a day (30 minutes before a meal).     ??? lidocaine 4 % patch Place 1 patch on the skin daily.     ??? losartan (COZAAR) 100 MG tablet Take 1 tablet (100 mg total) by mouth daily. 90 tablet 3   ??? metFORMIN (GLUCOPHAGE-XR) 500 MG 24 hr tablet Take  2 tablets (1,000 mg total) by mouth Two (2) times a day. 120 tablet 0   ??? pen needle, diabetic (BD ULTRA-FINE SHORT PEN NEEDLE) 31 gauge x 5/16 Ndle USE AS DIRECTED. USE 4 NEEDLES A DAY WITH DIABETIC MEDICATION PENS AS DIRECTED     ??? potassium chloride SA (K-DUR,KLOR-CON) 20 MEQ tablet Take 1 tablet (20 mEq total) by mouth daily. 90 tablet 1   ??? ranitidine (ZANTAC) 150 MG tablet Take 1 tablet (150 mg total) by mouth Two (2) times a day. 180 tablet 3   ??? simethicone (GAS-X) 80 MG chewable tablet Chew 1 tablet (80 mg total) 4 (four) times a day as needed for flatulence (gas relief). 100 tablet 2   ??? [EXPIRED] traMADol (ULTRAM) 50 mg tablet Take 1 tablet (50 mg total) by mouth every six (6) hours as needed for pain. for up to 5 days 20 tablet 0     No current facility-administered medications on file prior to visit.        ALLERGIES:   Allergies   Allergen Reactions   ??? Sulfasalazine Nausea Only       PMH:  Past Medical History:   Diagnosis Date   ??? Atrial fibrillation (CMS-HCC)    ??? Cancer (CMS-HCC)    ??? Diabetes mellitus (CMS-HCC)    ??? Hypertension    ??? Metastatic lung cancer (metastasis from lung to other site) (CMS-HCC)    ??? Pulmonary embolism (CMS-HCC)        SOCIAL HISTORY:  Lives with her 2 teenage grandsons. Her sons, daughter live nearby.    Never smoker  No alcohol    FAMILY HISTORY:  No hx of cancer in her parents.  Daughter has breast cancer.  Another daughter died of lung cancer and was a heavy smoker for many years.     PHYSICAL EXAMINATION:  There were no vitals taken for this visit. ***    ECOG 1  GENERAL: Thin, pleasant woman not in acute distress  MOUTH: Moist and clear without thrush  NECK: Supple  LYMPH: No cervical or supraclavicular lymphadenopathy  CHEST: CTAB  CV: Nl s1/s2  ABD: Soft, not tender, not distended, no palpable organomegaly  EXTREMITIES: Warm and well perfused x 4 without cyanosis, clubbing or edema  MUSKOLOSKELETAL: No spinal tenderness to palpation  NEURO: CN2-12 intact.  Speech and gait within normal limits  AFFECT: Varied and appropriate to situation  DERM: No lesions noted    LABS:  ***    RADIOLOGY:  ***    PATHOLOGY:  Final Diagnosis 05/19/17   A: Liver, lesion, needle core biopsy   - Metastatic poorly differentiated carcinoma, consistent with adenocarcinoma of lung origin (see ancillary studies)     NOTE: PDL-1 IHC on liver biopsy 56%    STRATA NGS Liver 05/31/17  MET exon 14  RB1 p.W681  TP53 p.L194F  MSS  TMB ??? Low; Mutations per MB: 7  PD-L1 ??? High; RNA expression score: 100      ASSESSMENT AND PLAN:  69 yoF with Stage IV NSCLC-adenocarcinoma, mets to liver, met del 14, on crizotinib.  ***    . She is a never smoker.    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 We are still awaiting results of IHC PDL-1 but we reviewed that even if high expression, there is no clear data regarding sequence order of a METi vs IO monotherapy. She is adamant that she is not willing  to consider chemotherapy or chemoIO combination as she wants to maintain her QoL preferentially. Started Crizotinib 250mg  BID 06/23/17 and DR to 250mg  QD on 07/17/17 for one week and resumed on 07/24/17 at 250mg  BID again. ***    Future options would include LCCC trial with herpes virus into pleural space or one of the newer more potent METi (e.g capmatinib, tepotonib) either on trial or if approved at that time.      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  Tolerating it well.  QTc 502 ms on 5/31 is within her range.  Will repeat at next visit.    Brain Metastases: Discussed with RadOnc and the larger lesion is most c/w a meningioma and does not need intervention.  The small punctate ones are too small to treat currently and will monitor.  Given that crizotinib has lower brain penetration, repeat MRI brain 07/19/17 stable.    Hyperglycemia:  BG 508 in clinic today, she took her 70/30 insulin at 7:30am.  Gave 10U short acting lispro in the office. Strongly recommended that she will follow up with her PCP locally again to adjust insulin dose - A1c was 10.3 on 08/01/17    Dysphagia/reflux: Resolved and eating well again.    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  Recommended folic acid daily. Will refer to Benign Hematology if anemia worsens.     Supportive Care: Home Health organized and will assist with management of pleurx catheter.  Still draining 200-327mls bilaterally.    Nutrition: No needs currently    Follow up: RTC in 4 weeks for toxicity check, repeat ECG for HR and QTc monitoring    The patients questions were answered to her satisfaction.

## 2017-08-25 NOTE — Unmapped (Signed)
Date of last visit:  08/22/2017  Medication refilled as per protocol.Metformin 1000 mg   LDL < 160 No components found for: LDLCALC,  LDL,  LDLDIRECT      Lab Results   Component Value Date    ALKPHOS 146 (H) 08/10/2017    BILITOT 0.4 08/10/2017    BILIDIR 0.20 07/11/2017    PROT 5.5 (L) 08/10/2017    ALBUMIN 2.4 (L) 08/10/2017    ALT 30 08/10/2017    AST 19 08/10/2017     HgbA1c < 9.0   Lab Results   Component Value Date    A1C 9.6 (A) 08/22/2017     TSH < 10 No results found for: TSH  Creatinine < 2.0   Lab Results   Component Value Date    NA 136 08/10/2017    K 3.5 08/10/2017    CL 102 08/10/2017    CO2 29.0 08/10/2017    BUN 24 (H) 08/10/2017    CREATININE 0.91 08/10/2017    GLU 238 (H) 08/10/2017    CALCIUM 8.6 08/10/2017    ALBUMIN 2.4 (L) 08/10/2017

## 2017-08-29 NOTE — Unmapped (Signed)
Hem/Onc Phone Triage Note    Caller: son [name]    Reason for Call:   [name] [name] is a 84 y.o. 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 Her son called today because she told him that when she peed this morning she wiped and saw a small amount of blood on the toilet paper. No other bleeding and is otherwise feeling well. She is on Eliquis and her son wants to know if he should continue it. He has already given her the dose for the morning.     I advised him that this is not a medial emergency but I will bring it to the primary team's attention.     Fellow Taking Call:  Renelda Mom  August 29, 2017 8:47 AM

## 2017-08-29 NOTE — Unmapped (Signed)
AOC Triage Note     Patient: [name] [name]     Reason for call:  scant blood after wiping     Time call returned: 1130     Phone Assessment: Spoke with patient's husband who previously spoke with on call resident. He reports that this morning she urinated and noticed a small amount of light blood on the toilet paper, pink in color. No blood visualized in the urine. Patient denies fevers, abdominal or back pain, pain or burning with urination or increased frequency of urination. Patient states that overall she feels pretty good.      Triage Recommendations: Advised patient to continue Eliquis as prescribed and monitor for any more episodes of bleeding with urination or UTI symptoms. Instructed to notify care team or PCP for any changes. Will message care team so they are aware and call back if there are any additional recommendations.      Patient Response: Husband verbalized understanding     Outstanding tasks: Any additional recommendations?     Patient Pharmacy has been verified and primary pharmacy has been marked as preferred

## 2017-08-29 NOTE — Unmapped (Signed)
Hi,     Patient's husband, [name] contacted the Communication Center regarding the following:    - Stated that the patient needs to get authorization to get her pleurx bottle.     Please contact [name] at [phone].    Thanks in advance,    Jodi Mourning  [facility]   [phone]

## 2017-08-29 NOTE — Unmapped (Signed)
Hi,     Verta Riedlinger contacted the [employer] regarding the following:    - Returning a missed call regarding patient [name] [name]. Patient's son had called earlier to report patient had a small amount of blood spotting after urinating.    Please contact Mr. Kocurek at [phone].    Thanks in advance,    Kelli Hope  [facility]   [phone]

## 2017-09-01 DIAGNOSIS — Z882 Allergy status to sulfonamides status: Secondary | ICD-10-CM | POA: Diagnosis not present

## 2017-09-01 DIAGNOSIS — I498 Other specified cardiac arrhythmias: Secondary | ICD-10-CM | POA: Diagnosis not present

## 2017-09-01 DIAGNOSIS — K219 Gastro-esophageal reflux disease without esophagitis: Secondary | ICD-10-CM | POA: Diagnosis not present

## 2017-09-01 DIAGNOSIS — D72829 Elevated white blood cell count, unspecified: Secondary | ICD-10-CM | POA: Diagnosis not present

## 2017-09-01 DIAGNOSIS — I4519 Other right bundle-branch block: Secondary | ICD-10-CM | POA: Diagnosis not present

## 2017-09-01 DIAGNOSIS — I10 Essential (primary) hypertension: Secondary | ICD-10-CM | POA: Diagnosis not present

## 2017-09-01 DIAGNOSIS — R0789 Other chest pain: Secondary | ICD-10-CM | POA: Diagnosis not present

## 2017-09-01 DIAGNOSIS — I493 Ventricular premature depolarization: Secondary | ICD-10-CM | POA: Diagnosis not present

## 2017-09-01 DIAGNOSIS — Z9049 Acquired absence of other specified parts of digestive tract: Secondary | ICD-10-CM | POA: Diagnosis not present

## 2017-09-01 DIAGNOSIS — E1165 Type 2 diabetes mellitus with hyperglycemia: Secondary | ICD-10-CM | POA: Diagnosis not present

## 2017-09-01 DIAGNOSIS — R079 Chest pain, unspecified: Secondary | ICD-10-CM | POA: Diagnosis not present

## 2017-09-01 DIAGNOSIS — R1013 Epigastric pain: Secondary | ICD-10-CM | POA: Diagnosis not present

## 2017-09-01 DIAGNOSIS — R0602 Shortness of breath: Secondary | ICD-10-CM | POA: Diagnosis not present

## 2017-09-01 DIAGNOSIS — Z794 Long term (current) use of insulin: Secondary | ICD-10-CM | POA: Diagnosis not present

## 2017-09-01 DIAGNOSIS — J910 Malignant pleural effusion: Secondary | ICD-10-CM | POA: Diagnosis not present

## 2017-09-01 DIAGNOSIS — I4891 Unspecified atrial fibrillation: Secondary | ICD-10-CM | POA: Diagnosis not present

## 2017-09-02 ENCOUNTER — Ambulatory Visit: Admit: 2017-09-02 | Discharge: 2017-09-02 | Disposition: A | Discharge status: Home / Self Care | Payer: MEDICARE

## 2017-09-02 ENCOUNTER — Emergency Department: Admit: 2017-09-02 | Discharge: 2017-09-02 | Disposition: A | Discharge status: Home / Self Care | Payer: MEDICARE

## 2017-09-02 DIAGNOSIS — R0789 Other chest pain: Principal | ICD-10-CM

## 2017-09-02 DIAGNOSIS — R079 Chest pain, unspecified: Secondary | ICD-10-CM | POA: Diagnosis not present

## 2017-09-02 DIAGNOSIS — K219 Gastro-esophageal reflux disease without esophagitis: Secondary | ICD-10-CM | POA: Diagnosis not present

## 2017-09-02 LAB — CBC W/ AUTO DIFF
BASOPHILS ABSOLUTE COUNT: 0 10*9/L (ref 0.0–0.1)
BASOPHILS RELATIVE PERCENT: 0.1 %
EOSINOPHILS ABSOLUTE COUNT: 0.1 10*9/L (ref 0.0–0.4)
EOSINOPHILS RELATIVE PERCENT: 0.8 %
HEMATOCRIT: 28.2 % — ABNORMAL LOW (ref 36.0–46.0)
LARGE UNSTAINED CELLS: 1 % (ref 0–4)
LYMPHOCYTES ABSOLUTE COUNT: 1.1 10*9/L — ABNORMAL LOW (ref 1.5–5.0)
LYMPHOCYTES RELATIVE PERCENT: 7.9 %
MEAN CORPUSCULAR HEMOGLOBIN CONC: 36.3 g/dL (ref 31.0–37.0)
MEAN CORPUSCULAR HEMOGLOBIN: 23.5 pg — ABNORMAL LOW (ref 26.0–34.0)
MEAN CORPUSCULAR VOLUME: 64.6 fL — ABNORMAL LOW (ref 80.0–100.0)
MEAN PLATELET VOLUME: 7.5 fL (ref 7.0–10.0)
MONOCYTES ABSOLUTE COUNT: 0.6 10*9/L (ref 0.2–0.8)
MONOCYTES RELATIVE PERCENT: 4.1 %
NEUTROPHILS ABSOLUTE COUNT: 11.7 10*9/L — ABNORMAL HIGH (ref 2.0–7.5)
NEUTROPHILS RELATIVE PERCENT: 86.3 %
PLATELET COUNT: 343 10*9/L (ref 150–440)
RED BLOOD CELL COUNT: 4.37 10*12/L (ref 4.00–5.20)
WBC ADJUSTED: 13.5 10*9/L — ABNORMAL HIGH (ref 4.5–11.0)

## 2017-09-02 LAB — COMPREHENSIVE METABOLIC PANEL
ALBUMIN: 2.4 g/dL — ABNORMAL LOW (ref 3.5–5.0)
ALKALINE PHOSPHATASE: 172 U/L — ABNORMAL HIGH (ref 38–126)
ALT (SGPT): 34 U/L (ref 15–48)
ANION GAP: 8 mmol/L — ABNORMAL LOW (ref 9–15)
AST (SGOT): 22 U/L (ref 14–38)
BILIRUBIN TOTAL: 0.7 mg/dL (ref 0.0–1.2)
BLOOD UREA NITROGEN: 21 mg/dL (ref 7–21)
CALCIUM: 8.5 mg/dL (ref 8.5–10.2)
CHLORIDE: 103 mmol/L (ref 98–107)
CO2: 25 mmol/L (ref 22.0–30.0)
CREATININE: 0.76 mg/dL (ref 0.60–1.00)
EGFR CKD-EPI AA FEMALE: 83 mL/min/{1.73_m2} (ref >=60)
EGFR CKD-EPI NON-AA FEMALE: 72 mL/min/{1.73_m2} (ref >=60)
GLUCOSE RANDOM: 330 mg/dL — ABNORMAL HIGH (ref 65–179)
POTASSIUM: 3.8 mmol/L (ref 3.5–5.0)
PROTEIN TOTAL: 5.8 g/dL — ABNORMAL LOW (ref 6.5–8.3)
SODIUM: 136 mmol/L (ref 135–145)

## 2017-09-02 LAB — POLYCHROMASIA

## 2017-09-02 LAB — TROPONIN I
Troponin I.cardiac:MCnc:Pt:Ser/Plas:Qn:: <0.06
Troponin I.cardiac:MCnc:Pt:Ser/Plas:Qn:: <0.06

## 2017-09-02 LAB — SLIDE REVIEW

## 2017-09-02 LAB — LIPASE: Triacylglycerol lipase:CCnc:Pt:Ser/Plas:Qn:: 86

## 2017-09-02 LAB — MEAN CORPUSCULAR HEMOGLOBIN CONC: Lab: 36.3

## 2017-09-02 LAB — EGFR CKD-EPI NON-AA FEMALE: Lab: 72

## 2017-09-02 MED ORDER — SUCRALFATE 1 GRAM TABLET: 1 g | tablet | Freq: Four times a day (QID) | 0 refills | 0 days | Status: AC

## 2017-09-02 MED ORDER — SUCRALFATE 1 GRAM TABLET
ORAL_TABLET | Freq: Four times a day (QID) | ORAL | 0 refills | 0.00000 days | Status: CP
Start: 2017-09-02 — End: 2017-09-02

## 2017-09-02 NOTE — Unmapped (Signed)
Pt with heavy feeling in her chest. Movement increased pain. Pt states if I could vomit, I believe I would feel better. Pain began at 2230.

## 2017-09-02 NOTE — Unmapped (Signed)
[name] Provider Note    ED Clinical Impression     Final diagnoses:   Gastroesophageal reflux disease, esophagitis presence not specified (Primary)   Chest pain, unspecified type   Hyperglycemia       Initial Impression, ED Course, Assessment and Plan     This is an 84 year old female with a history of lung cancer, A. fib on Eliquis, insulin-dependent diabetes, hypertension, GERD who presents with a burning epigastric pain after eating barbecue.  No fever, shortness of breath, back pain, nausea, vomiting, urinary symptoms, bloody stools.  No lower extremity edema or calf pain.  Symptoms similar to prior episodes of GERD.  Patient is well-appearing, lungs clear to auscultation bilaterally, irregularly irregular rhythm, no murmurs, gallops or rubs appreciated, abdomen soft non-stented with mild epigastric tenderness but no rebound or guarding.  No pulsatile masses.  No lower extremity edema or calf tenderness appreciated.  Most likely GERD but cannot exclude cardiac etiology.  Plan for labs, EKG, chest x-ray, GI cocktail.  Unlikely PE or dissection based on history and exam.    5:54 AM  Patient is asymptomatic after a GI cocktail.  Patient's labs significant for mild leukocytosis and elevated glucose.  She was given IV fluids, regular insulin and her morning NovoLog.  Troponin is negative.  Repeat troponin pending.  Chest x-ray is unchanged and EKG is significant for sinus arrhythmia with PVCs and incomplete right bundle branch block.  EKG looks similar to prior EKGs.  If repeat troponin is negative, plan for discharge with Carafate outpatient follow-up.    6:50 AM  Trop neg. Pt d/c with family.    I independently visualized the EKG tracing.   I independently visualized the radiology images.   I reviewed the patient's prior medical records.   I obtained additional history from her son.       ____________________________________________    Time seen: September 02, 2017 1:32 AM       History Chief Complaint  Chest Heaviness      HPI   [name] [name] is a 84 y.o. female with a history of metastatic lung cancer, atrial fibrillation on Eliquis, insulin-dependent diabetes, hypertension and GERD who presents with a burning epigastric pain after eating barbecue.  Pain is constant and moderate in severity.  It is nonradiating, non-pleuritic, nonexertional.  Is slightly worse with supine positioning.  There is no associated shortness of breath, fever, back pain, vomiting, bloody stools, lower extremity edema or calf pain.  Pain is similar to prior episodes of GERD.  No treatment prior to arrival.    Past Medical History:   Diagnosis Date   ??? Atrial fibrillation (CMS-HCC)    ??? Cancer (CMS-HCC)    ??? Diabetes mellitus (CMS-HCC)    ??? Hypertension    ??? Metastatic lung cancer (metastasis from lung to other site) (CMS-HCC)    ??? Pulmonary embolism (CMS-HCC)        Past Surgical History:   Procedure Laterality Date   ??? CHG RAD GUIDED,PERCUT DRAINAGE,W/CATH PLACE Right 06/19/2017    Procedure: RADIOLOGICAL GUIDANCE, FOR PERCUTANEOUS DRAINAGE, WITH PLACEMENT OF CATHETER, RAD S&I;  Surgeon: Sherwood Gambler, MD;  Location: [facility];  Service: Pulmonary   ??? CHOLECYSTECTOMY     ??? HYSTERECTOMY     ??? neck surgey     ??? PR INSERTION INDWELLING TUNNELED PLEURAL CATHETER Bilateral 06/19/2017    Procedure: INSERTION OF INDWELLING TUNNELED PLEURAL CATHETER WITH CUFF WITH MODERATE SEDATION;  Surgeon: Wendy Poet  Curt Bears, MD;  Location: [facility] PROCED[facility];  Service: Pulmonary   ??? PR MOD SED SAME PHYS/QHP EACH ADDL 15 MINS  06/19/2017    Procedure: MODERATE SEDATION SERVICES PROVIDED BY SAME PHYSICIAN OR OTHER QUALIFIED HEALTH CARE PROFESSIONAL PERFORMING THE DIAGNOSTIC OR THERAPEUTIC SERVICE THAT SEDATION SUPPORTS, EACH ADDITIONAL 15 MINS;  Surgeon: Sherwood Gambler, MD;  Location: [facility] PROCEDURE LAB [facility];  Service: Pulmonary   ??? PR MOD SED SAME PHYS/QHP INITIAL 15 MINS 5/> YRS  06/19/2017 Procedure: MODERATE SEDATION SERVICES PROVIDED BY SAME PHYSICIAN OR OTHER QUALIFIED HC PROFESSIONAL PERFORMING THE DIAGNOSTIC OR THERAPEUTIC SERVICE THAT SEDATION SUPPORTS, INIT 15 MINS, PT AGE 22 YEARS OR OLDER;  Surgeon: Sherwood Gambler, MD;  Location: [facility] PROCEDURE LAB [facility];  Service: Pulmonary       No current facility-administered medications for this encounter.     Current Outpatient Medications:   ???  amLODIPine (NORVASC) 5 MG tablet, Take 1 tablet (5 mg total) by mouth daily., Disp: 90 tablet, Rfl: 0  ???  blood sugar diagnostic (ACCU-CHEK AVIVA) Strp, , Disp: , Rfl:   ???  calcium carbonate-vitamin D3 (CALTRATE 600 + D) 600 mg (1,500 mg)-800 unit Chew, Chew 1 tablet Two (2) times a day., Disp: , Rfl:   ???  crizotinib (XALKORI) 250 mg capsule, Take 1 capsule (250 mg total) by mouth Two (2) times a day., Disp: 60 capsule, Rfl: 3  ???  flash glucose scanning reader, 1 each., Disp: , Rfl:   ???  flash glucose sensor kit, 3 each., Disp: , Rfl:   ???  furosemide (LASIX) 20 MG tablet, Take 20 mg by mouth daily as needed for swelling., Disp: , Rfl:   ???  insulin aspart prot/insuln asp (NOVOLOG MIX 70-30 FLEXPEN SUBQ), Inject 10 Units under the skin Two (2) times a day (30 minutes before a meal)., Disp: , Rfl:   ???  lidocaine 4 % patch, Place 1 patch on the skin daily., Disp: , Rfl:   ???  losartan (COZAAR) 100 MG tablet, Take 1 tablet (100 mg total) by mouth daily., Disp: 90 tablet, Rfl: 3  ???  metFORMIN (GLUCOPHAGE-XR) 500 MG 24 hr tablet, TAKE 2 TABLETS (1,000 MG TOTAL) BY MOUTH TWO (2) TIMES A DAY., Disp: 360 tablet, Rfl: 3  ???  pen needle, diabetic (BD ULTRA-FINE SHORT PEN NEEDLE) 31 gauge x 5/16 Ndle, USE AS DIRECTED. USE 4 NEEDLES A DAY WITH DIABETIC MEDICATION PENS AS DIRECTED, Disp: , Rfl:   ???  potassium chloride SA (K-DUR,KLOR-CON) 20 MEQ tablet, Take 1 tablet (20 mEq total) by mouth daily., Disp: 90 tablet, Rfl: 1  ???  ranitidine (ZANTAC) 150 MG tablet, Take 1 tablet (150 mg total) by mouth Two (2) times a day., Disp: 180 tablet, Rfl: 3  ???  simethicone (GAS-X) 80 MG chewable tablet, Chew 1 tablet (80 mg total) 4 (four) times a day as needed for flatulence (gas relief)., Disp: 100 tablet, Rfl: 2    Allergies  Sulfasalazine    Family History   Problem Relation Age of Onset   ??? Cancer Brother         lung   ??? Cancer Daughter         lung   ??? Cancer Daughter         breast       Social History  Social History     Tobacco Use   ??? Smoking status: Never Smoker   ??? Smokeless tobacco: Never Used   Substance Use  Topics   ??? Alcohol use: No   ??? Drug use: Never       Review of Systems  Constitutional: Negative for fever.  Eyes: Negative for visual changes.  ENT: Negative for sore throat.  Cardiovascular: Positive for chest pain.  Respiratory: Negative for shortness of breath.  Gastrointestinal: Positive for abdominal pain, negative for vomiting or diarrhea.  Genitourinary: Negative for dysuria or hematuria.  Musculoskeletal: Negative for back pain.  Negative for leg swelling or calf pain.  Skin: Negative for rash.  Neurological: Negative for headaches, weakness or numbness.      Physical Exam     VITAL SIGNS:    ED Triage Vitals [09/02/17 0005]   Enc Vitals Group      BP 151/69      Heart Rate 76      SpO2 Pulse       Resp 16      Temp 36.8 ??C (98.3 ??F)      Temp Source Oral      SpO2 97 %      Weight 51.7 kg (114 lb)      Height 1.626 m (5' 4)       Constitutional: Alert and oriented. Well appearing and in no distress.  Eyes: Conjunctivae are normal.  ENT       Head: Normocephalic and atraumatic.       Nose: No congestion.       Mouth/Throat: Mucous membranes are moist.       Neck: No stridor, neck is supple.  Hematological/Lymphatic/Immunilogical: No cervical lymphadenopathy.  Cardiovascular: Normal rate, regular rhythm. No m/g/r. Normal and symmetric distal pulses are present in all extremities.   Respiratory: Normal respiratory effort. Breath sounds are normal. No wheezes, rales, rhonchi.  Gastrointestinal: Soft and nondistended, mild epigastric tenderness without rebound, guarding or masses. There is no CVA tenderness.  Musculoskeletal: Nontender with normal range of motion in all extremities.       Right lower leg: No tenderness or edema.       Left lower leg: No tenderness or edema.  Neurologic: Normal speech and language. No gross focal neurologic deficits are appreciated.   Skin: Skin is warm, dry and intact. No rash noted.  Psychiatric: Mood and affect are normal. Speech and behavior are normal.      EKG      Adult ECG Report     Name: Bevely Palmer   Age: 84 y.o.   Gender: female       Rate: 64   Rhythm: sinus arrhythmia   Narrative Interpretation: No ST elevation, delta waves or Brugada, RSR prime in V1 through V3, low voltage, unchanged from prior EKG      Radiology     XR Chest 2 views   Preliminary Result      Interval retraction/migration of left-sided chest tube with no new pulmonary opacities.               Pertinent labs & imaging results that were available during my care of the patient were reviewed by me and considered in my medical decision making (see chart for details).       Jamas Lav, MD  09/02/17 [phone]

## 2017-09-02 NOTE — Unmapped (Addendum)
Patient in bathroom

## 2017-09-02 NOTE — Unmapped (Signed)
Patient rounds completed. No needs at this time, call bell in reach

## 2017-09-02 NOTE — Unmapped (Signed)
Pain reassessment after medication administered. The patient has had no adverse reaction. Patient reports complete resolution.  Patient's overall course: stable and basically asymptomatic and course of pain: well controlled. Will continue to monitor

## 2017-09-02 NOTE — Unmapped (Signed)
Delirium Triage Screening (DTS) performed. Patient screened negative.

## 2017-09-02 NOTE — Unmapped (Signed)
Patient transported to X-ray  Transported by Radiology  How tranported Wheelchair  Cardiac Monitor no

## 2017-09-02 NOTE — Unmapped (Signed)
FSBS 294.

## 2017-09-04 DIAGNOSIS — E119 Type 2 diabetes mellitus without complications: Secondary | ICD-10-CM | POA: Diagnosis not present

## 2017-09-04 DIAGNOSIS — R9431 Abnormal electrocardiogram [ECG] [EKG]: Secondary | ICD-10-CM | POA: Diagnosis not present

## 2017-09-04 DIAGNOSIS — I491 Atrial premature depolarization: Secondary | ICD-10-CM | POA: Diagnosis not present

## 2017-09-04 DIAGNOSIS — Z7901 Long term (current) use of anticoagulants: Secondary | ICD-10-CM | POA: Diagnosis not present

## 2017-09-04 DIAGNOSIS — R079 Chest pain, unspecified: Secondary | ICD-10-CM | POA: Diagnosis not present

## 2017-09-04 DIAGNOSIS — R0602 Shortness of breath: Secondary | ICD-10-CM | POA: Diagnosis not present

## 2017-09-04 DIAGNOSIS — J9811 Atelectasis: Secondary | ICD-10-CM | POA: Diagnosis not present

## 2017-09-04 DIAGNOSIS — I10 Essential (primary) hypertension: Secondary | ICD-10-CM | POA: Diagnosis not present

## 2017-09-04 DIAGNOSIS — I4891 Unspecified atrial fibrillation: Secondary | ICD-10-CM | POA: Diagnosis not present

## 2017-09-04 DIAGNOSIS — J90 Pleural effusion, not elsewhere classified: Secondary | ICD-10-CM | POA: Diagnosis not present

## 2017-09-04 DIAGNOSIS — K219 Gastro-esophageal reflux disease without esophagitis: Secondary | ICD-10-CM | POA: Diagnosis not present

## 2017-09-04 DIAGNOSIS — R918 Other nonspecific abnormal finding of lung field: Secondary | ICD-10-CM | POA: Diagnosis not present

## 2017-09-05 ENCOUNTER — Ambulatory Visit: Admit: 2017-09-05 | Discharge: 2017-09-05 | Disposition: A | Discharge status: Home / Self Care | Payer: MEDICARE

## 2017-09-05 ENCOUNTER — Emergency Department: Admit: 2017-09-05 | Discharge: 2017-09-05 | Disposition: A | Discharge status: Home / Self Care | Payer: MEDICARE

## 2017-09-05 DIAGNOSIS — R079 Chest pain, unspecified: Principal | ICD-10-CM

## 2017-09-05 DIAGNOSIS — J90 Pleural effusion, not elsewhere classified: Secondary | ICD-10-CM | POA: Diagnosis not present

## 2017-09-05 DIAGNOSIS — R9431 Abnormal electrocardiogram [ECG] [EKG]: Secondary | ICD-10-CM | POA: Diagnosis not present

## 2017-09-05 DIAGNOSIS — I491 Atrial premature depolarization: Secondary | ICD-10-CM | POA: Diagnosis not present

## 2017-09-05 DIAGNOSIS — J9811 Atelectasis: Secondary | ICD-10-CM | POA: Diagnosis not present

## 2017-09-05 LAB — CBC W/ AUTO DIFF
BASOPHILS ABSOLUTE COUNT: 0 10*9/L (ref 0.0–0.1)
BASOPHILS RELATIVE PERCENT: 0.4 %
EOSINOPHILS ABSOLUTE COUNT: 0.2 10*9/L (ref 0.0–0.4)
EOSINOPHILS RELATIVE PERCENT: 1.4 %
HEMATOCRIT: 27.9 % — ABNORMAL LOW (ref 36.0–46.0)
HEMOGLOBIN: 10.2 g/dL — ABNORMAL LOW (ref 13.5–16.0)
LYMPHOCYTES ABSOLUTE COUNT: 1.2 10*9/L — ABNORMAL LOW (ref 1.5–5.0)
LYMPHOCYTES RELATIVE PERCENT: 11.1 %
MEAN CORPUSCULAR HEMOGLOBIN CONC: 36.5 g/dL (ref 31.0–37.0)
MEAN CORPUSCULAR HEMOGLOBIN: 23.1 pg — ABNORMAL LOW (ref 26.0–34.0)
MEAN CORPUSCULAR VOLUME: 63.2 fL — ABNORMAL LOW (ref 80.0–100.0)
MEAN PLATELET VOLUME: 7.3 fL (ref 7.0–10.0)
MONOCYTES ABSOLUTE COUNT: 0.8 10*9/L (ref 0.2–0.8)
MONOCYTES RELATIVE PERCENT: 6.9 %
NEUTROPHILS ABSOLUTE COUNT: 8.6 10*9/L — ABNORMAL HIGH (ref 2.0–7.5)
NEUTROPHILS RELATIVE PERCENT: 79.2 %
PLATELET COUNT: 328 10*9/L (ref 150–440)
RED BLOOD CELL COUNT: 4.41 10*12/L (ref 4.00–5.20)
RED CELL DISTRIBUTION WIDTH: 20.4 % — ABNORMAL HIGH (ref 12.0–15.0)
WBC ADJUSTED: 10.8 10*9/L (ref 4.5–11.0)

## 2017-09-05 LAB — COMPREHENSIVE METABOLIC PANEL
ALBUMIN: 2.3 g/dL — ABNORMAL LOW (ref 3.5–5.0)
ALKALINE PHOSPHATASE: 146 U/L — ABNORMAL HIGH (ref 38–126)
ALT (SGPT): 30 U/L (ref 15–48)
AST (SGOT): 20 U/L (ref 14–38)
BILIRUBIN TOTAL: 0.4 mg/dL (ref 0.0–1.2)
BLOOD UREA NITROGEN: 18 mg/dL (ref 7–21)
BUN / CREAT RATIO: 23
CALCIUM: 8.4 mg/dL — ABNORMAL LOW (ref 8.5–10.2)
CHLORIDE: 103 mmol/L (ref 98–107)
CO2: 29 mmol/L (ref 22.0–30.0)
CREATININE: 0.79 mg/dL (ref 0.60–1.00)
EGFR CKD-EPI NON-AA FEMALE: 69 mL/min/{1.73_m2} (ref >=60)
GLUCOSE RANDOM: 188 mg/dL — ABNORMAL HIGH (ref 65–179)
POTASSIUM: 3.7 mmol/L (ref 3.5–5.0)
PROTEIN TOTAL: 5.3 g/dL — ABNORMAL LOW (ref 6.5–8.3)
SODIUM: 136 mmol/L (ref 135–145)

## 2017-09-05 LAB — LIPASE: Triacylglycerol lipase:CCnc:Pt:Ser/Plas:Qn:: 90

## 2017-09-05 LAB — TROPONIN I
Troponin I.cardiac:MCnc:Pt:Ser/Plas:Qn:: <0.06
Troponin I.cardiac:MCnc:Pt:Ser/Plas:Qn:: <0.06

## 2017-09-05 LAB — POLYCHROMASIA

## 2017-09-05 LAB — PROTEIN TOTAL: Protein:MCnc:Pt:Ser/Plas:Qn:: 5.3 — ABNORMAL LOW

## 2017-09-05 LAB — HYPERCHROMASIA

## 2017-09-05 NOTE — Unmapped (Signed)
Pt with approximately 3 hours of chest pain that began shortly after eating dinner. Pt said she felt good all day.

## 2017-09-05 NOTE — Unmapped (Signed)
Pt ready for dc, verbalize instructions. No further request at this time.

## 2017-09-05 NOTE — Unmapped (Signed)
Emergency Department Provider Note        ED Clinical Impression     Final diagnoses:   Chest pain, unspecified type (Primary)   Gastroesophageal reflux disease, esophagitis presence not specified       ED Assessment/Plan     Patient is a 84 y.o. female with PMH of HTN, A-fib (on Eliquis), stage 4 metastatic lung cancer (on crizotinib), and T2DM presenting for chest pain radiating to back with associated shortness of breath worse than baseline onset 2.5 hours ago after eating dinner this evening.    On exam, the patient appears chronically ill, but is in NAD. VS are WNL. Irregularly irregular rhythm. Normal rate. No murmurs. Diminished breath sounds bilaterally. No abdominal pain, but nausea with epigastric palpation. LLE edema 2+ vs RLE.    Differential includes GERD vs less likely ACS vs PE, dissection, pneumonia    Plan for EKG, CXR, and labs, including troponin and lipase. Will give GI cocktail.      2:47 AM  After GI cocktail pt has had resolution of symptoms. Will get 3 hour troponin    3:30 AM  Second troponin negative.  Patient has had no symptoms since having GI cocktail several hours ago.  As she has presented with similar presentation several times before, I feel she is stable for discharge home.  She has a follow-up with her oncologist in a few hours, and I feel it is important that she keep this appointment.    History     Chief Complaint   Patient presents with   ??? Chest Pain     HPI    [name] [name] is a 84 y.o. female with PMH of HTN, A-fib (on Eliquis), stage 4 metastatic lung cancer (on crizotinib), and T2DM presenting to the ED for chest pain. Patient reports chest pain radiating to back with associated shortness of breath worse than baseline onset 2.5 hours ago after eating dinner. She endorses nausea, although has not vomited. She has been having intermittent chest pain for the past couple months and states her pain is similar to the past. She has been evaluated in the ED several times although she states she has never had relief with the medications given in the past. Patient was discharged home with Carafate which she has been compliant with to no relief. She has an appointment scheduled with her oncologist in the morning. Additionally, patient's son notes patient's left leg has been more swollen than the right for the past day, although this is typical for her with increased activity levels.     Past Medical History:   Diagnosis Date   ??? Atrial fibrillation (CMS-HCC)    ??? Cancer (CMS-HCC)    ??? Diabetes mellitus (CMS-HCC)    ??? Hypertension    ??? Metastatic lung cancer (metastasis from lung to other site) (CMS-HCC)    ??? Pulmonary embolism (CMS-HCC)        Past Surgical History:   Procedure Laterality Date   ??? CHG RAD GUIDED,PERCUT DRAINAGE,W/CATH PLACE Right 06/19/2017    Procedure: RADIOLOGICAL GUIDANCE, FOR PERCUTANEOUS DRAINAGE, WITH PLACEMENT OF CATHETER, RAD S&I;  Surgeon: Sherwood Gambler, MD;  Location: [facility] PROCED[facility];  Service: Pulmonary   ??? CHOLECYSTECTOMY     ??? HYSTERECTOMY     ??? neck surgey     ??? PR INSERTION INDWELLING TUNNELED PLEURAL CATHETER Bilateral 06/19/2017    Procedure: INSERTION OF INDWELLING TUNNELED PLEURAL CATHETER WITH CUFF WITH MODERATE SEDATION;  Surgeon: Sherwood Gambler,  MD;  Location: [facility] PROCEDURE LAB [facility];  Service: Pulmonary   ??? PR MOD SED SAME PHYS/QHP EACH ADDL 15 MINS  06/19/2017    Procedure: MODERATE SEDATION SERVICES PROVIDED BY SAME PHYSICIAN OR OTHER QUALIFIED HEALTH CARE PROFESSIONAL PERFORMING THE DIAGNOSTIC OR THERAPEUTIC SERVICE THAT SEDATION SUPPORTS, EACH ADDITIONAL 15 MINS;  Surgeon: Sherwood Gambler, MD;  Location: BRON[facility] PROCEDURE LAB [facility];  Service: Pulmonary   ??? PR MOD SED SAME PHYS/QHP INITIAL 15 MINS 5/> YRS  06/19/2017    Procedure: MODERATE SEDATION SERVICES PROVIDED BY SAME PHYSICIAN OR OTHER QUALIFIED HC PROFESSIONAL PERFORMING THE DIAGNOSTIC OR THERAPEUTIC SERVICE THAT SEDATION SUPPORTS, INIT 15 MINS, PT AGE 35 YEARS OR OLDER;  Surgeon: Sherwood Gambler, MD;  Location: [facility] PROCEDURE LAB [facility];  Service: Pulmonary       Family History   Problem Relation Age of Onset   ??? Cancer Brother         lung   ??? Cancer Daughter         lung   ??? Cancer Daughter         breast       Social History     Socioeconomic History   ??? Marital status: Widowed     Spouse name: Not on file   ??? Number of children: Not on file   ??? Years of education: Not on file   ??? Highest education level: Not on file   Occupational History   ??? Not on file   Social Needs   ??? Financial resource strain: Not on file   ??? Food insecurity:     Worry: Not on file     Inability: Not on file   ??? Transportation needs:     Medical: Not on file     Non-medical: Not on file   Tobacco Use   ??? Smoking status: Never Smoker   ??? Smokeless tobacco: Never Used   Substance and Sexual Activity   ??? Alcohol use: No   ??? Drug use: Never   ??? Sexual activity: Not on file   Lifestyle   ??? Physical activity:     Days per week: Not on file     Minutes per session: Not on file   ??? Stress: Not on file   Relationships   ??? Social connections:     Talks on phone: Not on file     Gets together: Not on file     Attends religious service: Not on file     Active member of club or organization: Not on file     Attends meetings of clubs or organizations: Not on file     Relationship status: Not on file   Other Topics Concern   ??? Do you use sunscreen? No   ??? Tanning bed use? No   ??? Are you easily burned? No   ??? Excessive sun exposure? No   ??? Blistering sunburns? No   Social History Narrative   ??? Not on file       Review of Systems    Physical Exam     There were no vitals taken for this visit.    Physical Exam   Constitutional: She is oriented to person, place, and time. She appears well-developed and well-nourished. She appears ill.   HENT:   Head: Normocephalic and atraumatic.   Nose: Nose normal.   Mouth/Throat: Oropharynx is clear and moist. No oropharyngeal exudate.   Eyes: Pupils are equal, round, and reactive to light.  Conjunctivae and EOM are normal.   Neck: Normal range of motion. Neck supple.   Cardiovascular: Normal rate, normal heart sounds and intact distal pulses. An irregularly irregular rhythm present. Exam reveals no gallop and no friction rub.   No murmur heard.  Pulmonary/Chest: Effort normal. No stridor. No respiratory distress. She has no wheezes. She has no rales. She exhibits no tenderness.   Diminished breath sounds bilaterally.    Abdominal: Soft. Bowel sounds are normal. She exhibits no distension and no mass. There is no tenderness. There is no rebound and no guarding.   No abdominal pain, but nausea with epigastric palpation   Musculoskeletal: Normal range of motion. She exhibits no tenderness.   LLE edema 2+ vs RLE.     Neurological: She is alert and oriented to person, place, and time. She has normal reflexes. She exhibits normal muscle tone. Coordination normal.   Skin: Skin is warm and dry. No rash noted. No erythema. No pallor.   Psychiatric: She has a normal mood and affect. Her behavior is normal. Judgment and thought content normal.   Nursing note and vitals reviewed.        MDM  Reviewed: previous chart, nursing note and vitals  Reviewed previous: labs, ECG and x-ray  Interpretation: labs, ECG and x-ray         ___Xr Chest 2 Views    Result Date: 09/05/2017  EXAM: XR CHEST 2 VIEWS DATE: 09/05/2017 12:39 AM ACCESSION: [id number] UN DICTATED: 09/05/2017 1:15 AM INTERPRETATION LOCATION: [facility] CLINICAL INDICATION: 84 years old Female with CHEST PAIN  COMPARISON: Chest radiograph dated 09/02/2017 TECHNIQUE: PA and Lateral Chest Radiographs. FINDINGS: Interval repositioning of bilateral chest tubes. Lungs are hypoinflated. Small bilateral pleural effusions. Bibasilar opacities, likely atelectasis. No pneumothorax. Cardiolite central silhouette is partially obscured. Unchanged thoracic spine compression deformity.     - Interval repositioning bilateral chest tubes with small bilateral pleural effusions noted. - Hypoinflation with bibasilar atelectasis.    Labs Reviewed   COMPREHENSIVE METABOLIC PANEL - Abnormal; Notable for the following components:       Result Value    Anion Gap 4 (*)     Glucose 188 (*)     Calcium 8.4 (*)     Albumin 2.3 (*)     Total Protein 5.3 (*)     Alkaline Phosphatase 146 (*)     All other components within normal limits   CBC W/ AUTO DIFF - Abnormal; Notable for the following components:    HGB 10.2 (*)     HCT 27.9 (*)     MCV 63.2 (*)     MCH 23.1 (*)     RDW 20.4 (*)     Neutrophil Left Shift 1+ (*)     Absolute Neutrophils 8.6 (*)     Absolute Lymphocytes 1.2 (*)     Microcytosis Marked (*)     Anisocytosis Moderate (*)     Hyperchromasia Marked (*)     All other components within normal limits    Narrative:     Please use the Absolute Differential for reference ranges.    SLIDE REVIEW - Abnormal; Notable for the following components:    Polychromasia Slight (*)     All other components within normal limits    Narrative:     Irregularly contracted rbc's   TROPONIN I - Normal   LIPASE - Normal   TROPONIN I - Normal   CBC W/ DIFFERENTIAL    Narrative:  The following orders were created for panel order CBC w/ Differential.  Procedure                               Abnormality         Status                     ---------                               -----------         ------                     CBC w/ Differential[[id number]]         Abnormal            Final result               Morphology Review[[id number]]           Abnormal            Final result                 Please view results for these tests on the individual orders.     ___________________________________________________   Documentation assistance was provided by Eliezer Mccoy, Scribe, on September 05, 2017 at 12:27 AM for Marisa Severin, MD.    September 05, 2017 3:31 AM. Documentation assistance provided by the scribe. I was present during the time the encounter was recorded. The information recorded by the scribe was done at my direction and has been reviewed and validated by me.        Desma Mcgregor, MD  09/05/17 [phone]

## 2017-09-05 NOTE — Unmapped (Signed)
MD at bedside. 

## 2017-09-05 NOTE — Unmapped (Signed)
Patient transported to X-ray  Transported by Radiology  How tranported Stretcher  Cardiac Monitor yes

## 2017-09-05 NOTE — Unmapped (Signed)
Water given as request, tolerating well.

## 2017-09-05 NOTE — Unmapped (Signed)
Delirium Triage Screening (DTS) performed. Patient screened negative.

## 2017-09-05 NOTE — Unmapped (Signed)
Hi Dr. Janann August,    Patient [name] [name] contacted phone room to cancel their appointment for today.  The appointment has been cancelled.    Cancellation Reason: Sick    Thank Rolin Barry  [facility]   [phone]

## 2017-09-06 NOTE — Unmapped (Signed)
HPI:  [name] [name] is an 83 yo F, never smoker, who initially became symptomatic  with dyspnea and cough in late Feb 2019.   History as follows:  Oncology History    - 04/30/17: Presented to ED with dyspnea for over 2 weeks.  CTA with PEs, b/l pleural effusions, mediastinal soft tissue mass 9/5/7cm, invading LUL nd 8.2cm lesion inleft hepatic lobe  - 05/17/17: b/l thoracentesis with 2L removed each lung.  Cytology with no malignant cells but lymphocyte predominant exudative effusiions  - 05/18/17: Biopsy liver lesion c/w lung adenocarcinoma        She lives with her her 2 teenage grandsons and has attentive sons/daughter close by.  Her daughter-in-law has been sleeping overnight with her since diagnosis.  She dresses herself, showers, able to do ADLs but family does most of cooking, grocery shopping and cleaning.  Her son manages her medicines and finances. She is otherwise feeling reasonable well.  Has some fatigue. No falls. Does not use a walker or cane.  She is able to walk around [place] but after exertion like this, has some chest pressure and has to sit down to rest, with resolution usually by .  No stairs at home.  She has insulin dependent DM, HTN and Afib, controlled with meds.      Interval Hx  Initiated crizotinib on 06/23/17 and dose reduced to 250mg  QD on 07/17/17 and then went back up again to BID on 07/24/17.  Seen in ER 7/9 for CP and therefor missed her appt w/me; CP was relieved w/GI cocktail and CEs were negative.   Since, she notes that a large meal brings it on and that upright position helps.  Sometimes when she walks, she shakes; this is relieved by rest; this has been going on for months; she has some cloudy thinking x seconds after these episodes.  It only happens when she first stands up.  Pleurx drainage 450-500 on both sides.  It is getting darker on L.  BGs fluctuate, today was low.  Only a single episode of diarrhea; this was after a laxative.    ROS:  10 systems were reviewed and found to be within normal limits except as described in the HPI.    MEDICATIONS:  Current Outpatient Medications on File Prior to Visit   Medication Sig Dispense Refill   ??? amLODIPine (NORVASC) 5 MG tablet Take 1 tablet (5 mg total) by mouth daily. 90 tablet 0   ??? [EXPIRED] apixaban (ELIQUIS) 5 mg Tab Take 1 tablet (5 mg total) by mouth Two (2) times a day. 180 tablet 3   ??? blood sugar diagnostic (ACCU-CHEK AVIVA) Strp      ??? calcium carbonate-vitamin D3 (CALTRATE 600 + D) 600 mg (1,500 mg)-800 unit Chew Chew 1 tablet Two (2) times a day.     ??? crizotinib (XALKORI) 250 mg capsule Take 1 capsule (250 mg total) by mouth Two (2) times a day. 60 capsule 3   ??? flash glucose scanning reader 1 each.     ??? flash glucose sensor kit 3 each.     ??? furosemide (LASIX) 20 MG tablet Take 20 mg by mouth daily as needed for swelling.     ??? insulin aspart prot/insuln asp (NOVOLOG MIX 70-30 FLEXPEN SUBQ) Inject 10 Units under the skin Two (2) times a day (30 minutes before a meal).     ??? lidocaine 4 % patch Place 1 patch on the skin daily.     ??? losartan (  COZAAR) 100 MG tablet Take 1 tablet (100 mg total) by mouth daily. 90 tablet 3   ??? metFORMIN (GLUCOPHAGE-XR) 500 MG 24 hr tablet TAKE 2 TABLETS (1,000 MG TOTAL) BY MOUTH TWO (2) TIMES A DAY. 360 tablet 3   ??? pen needle, diabetic (BD ULTRA-FINE SHORT PEN NEEDLE) 31 gauge x 5/16 Ndle USE AS DIRECTED. USE 4 NEEDLES A DAY WITH DIABETIC MEDICATION PENS AS DIRECTED     ??? potassium chloride SA (K-DUR,KLOR-CON) 20 MEQ tablet Take 1 tablet (20 mEq total) by mouth daily. 90 tablet 1   ??? ranitidine (ZANTAC) 150 MG tablet Take 1 tablet (150 mg total) by mouth Two (2) times a day. 180 tablet 3   ??? simethicone (GAS-X) 80 MG chewable tablet Chew 1 tablet (80 mg total) 4 (four) times a day as needed for flatulence (gas relief). 100 tablet 2   ??? sucralfate (CARAFATE) 1 gram tablet Take 1 tablet (1 g total) by mouth Four (4) times a day. 120 tablet 0   ??? [EXPIRED] traMADol (ULTRAM) 50 mg tablet Take 1 tablet (50 mg total) by mouth every six (6) hours as needed for pain. for up to 5 days 20 tablet 0     No current facility-administered medications on file prior to visit.        ALLERGIES:   Allergies   Allergen Reactions   ??? Sulfasalazine Nausea Only       PMH:  Past Medical History:   Diagnosis Date   ??? Atrial fibrillation (CMS-HCC)    ??? Cancer (CMS-HCC)    ??? Diabetes mellitus (CMS-HCC)    ??? Hypertension    ??? Metastatic lung cancer (metastasis from lung to other site) (CMS-HCC)    ??? Pulmonary embolism (CMS-HCC)        SOCIAL HISTORY:  Lives with her 2 teenage grandsons. Her sons, daughter live nearby.    Never smoker  No alcohol    FAMILY HISTORY:  No hx of cancer in her parents.  Daughter has breast cancer.  Another daughter died of lung cancer and was a heavy smoker for many years.     PHYSICAL EXAMINATION:  BP 95/53  - Pulse 79  - Temp 36.8 ??C (98.2 ??F) (Oral)  - Resp 18  - Ht 162.6 cm (5' 4)  - Wt 56.4 kg (124 lb 6.4 oz)  - SpO2 96%  - BMI 21.35 kg/m??      ECOG 1  GENERAL: Thin, pleasant woman not in acute distress  MOUTH: Moist and clear without thrush  NECK: Supple  LYMPH: No cervical or supraclavicular lymphadenopathy  CHEST: CTAB  CV: Nl s1/s2  ABD: Soft, not tender, not distended, no palpable organomegaly  EXTREMITIES: Warm and well perfused x 4 without cyanosis, clubbing.  RLE unchanged (has previuosly had doppler US)  MUSKOLOSKELETAL: No spinal tenderness to palpation  NEURO: CN2-12 intact.  Speech and gait within normal limits  AFFECT: Varied and appropriate to situation  DERM: No lesions noted    LABS:  Results for [name] [name], [name] [name] (MRN [id number]) as of 09/12/2017 10:06   Ref. Range 09/05/2017 00:18   WBC Latest Ref Range: 4.5 - 11.0 10*9/L 10.8   RBC Latest Ref Range: 4.00 - 5.20 10*12/L 4.41   HGB Latest Ref Range: 13.5 - 16.0 g/dL 41.3 (L)   HCT Latest Ref Range: 36.0 - 46.0 % 27.9 (L)   MCV Latest Ref Range: 80.0 - 100.0 fL 63.2 (L)   MCH Latest Ref Range: 26.0 - 34.0  pg 23.1 (L) MCHC Latest Ref Range: 31.0 - 37.0 g/dL 16.1   RDW Latest Ref Range: 12.0 - 15.0 % 20.4 (H)   MPV Latest Ref Range: 7.0 - 10.0 fL 7.3   Platelet Latest Ref Range: 150 - 440 10*9/L 328   Neutrophils % Latest Units: % 79.2   Lymphocytes % Latest Units: % 11.1   Monocytes % Latest Units: % 6.9   Eosinophils % Latest Units: % 1.4   Basophils % Latest Units: % 0.4   Absolute Neutrophils Latest Ref Range: 2.0 - 7.5 10*9/L 8.6 (H)   Absolute Lymphocytes Latest Ref Range: 1.5 - 5.0 10*9/L 1.2 (L)   Absolute Monocytes  Latest Ref Range: 0.2 - 0.8 10*9/L 0.8   Absolute Eosinophils Latest Ref Range: 0.0 - 0.4 10*9/L 0.2   Absolute Basophils  Latest Ref Range: 0.0 - 0.1 10*9/L 0.0   Microcytosis Latest Ref Range: Not Present  Marked (A)   Anisocytosis Latest Ref Range: Not Present  Moderate (A)   Hyperchromasia Latest Ref Range: Not Present  Marked (A)   Polychromasia Latest Ref Range: Not Present  Slight (A)   Large Unstained Cells Latest Ref Range: 0 - 4 % 1   Neutrophil Left Shift Latest Ref Range: Not Present  1+ (A)   Sodium Latest Ref Range: 135 - 145 mmol/L 136   Potassium Latest Ref Range: 3.5 - 5.0 mmol/L 3.7   Chloride Latest Ref Range: 98 - 107 mmol/L 103   CO2 Latest Ref Range: 22.0 - 30.0 mmol/L 29.0   Bun Latest Ref Range: 7 - 21 mg/dL 18   Creatinine Latest Ref Range: 0.60 - 1.00 mg/dL 0.96   BUN/Creatinine Ratio Unknown 23   EGFR CKD-EPI African American, Female Latest Ref Range: >=60 mL/min/1.80m2 79   EGFR CKD-EPI Non-African American, Female Latest Ref Range: >=60 mL/min/1.71m2 69   Anion Gap Latest Ref Range: 9 - 15 mmol/L 4 (L)   Glucose Latest Ref Range: 65 - 179 mg/dL 045 (H)   Calcium Latest Ref Range: 8.5 - 10.2 mg/dL 8.4 (L)   Albumin Latest Ref Range: 3.5 - 5.0 g/dL 2.3 (L)   Total Protein Latest Ref Range: 6.5 - 8.3 g/dL 5.3 (L)   Total Bilirubin Latest Ref Range: 0.0 - 1.2 mg/dL 0.4   AST Latest Ref Range: 14 - 38 U/L 20   ALT Latest Ref Range: 15 - 48 U/L 30   Alkaline Phosphatase Latest Ref Range: 38 - 126 U/L 146 (H)   Lipase Latest Ref Range: 44 - 232 U/L 90   Troponin I Latest Ref Range: <0.060 ng/mL <0.060     EKG QTC Calculation: 422 ms          RADIOLOGY:  None new    PATHOLOGY:  Final Diagnosis 05/19/17   A: Liver, lesion, needle core biopsy   - Metastatic poorly differentiated carcinoma, consistent with adenocarcinoma of lung origin (see ancillary studies)     NOTE: PDL-1 IHC on liver biopsy 40%    STRATA NGS Liver 05/31/17  MET exon 14  RB1 p.W681  TP53 p.L194F  MSS  TMB ??? Low; Mutations per MB: 7  PD-L1 ??? High; RNA expression score: 100      ASSESSMENT AND PLAN:  56 yoF with Stage IV NSCLC-adenocarcinoma, mets to liver, met del 14, on crizotinib.      Presyncope- She does have known brain mets.  Repeat brain MRI ordered to evaluate this. I am concerned about some seizure-like character to what  is being described.  But, could also be simply hypovolemia, so also advised increased hydration    Increased pleural drainage- Could be PD, but I don't see that either effusion was ever proven malignant.  Has PAH but no LV dysfunction.  Has hypoalbuminemia.    NSCLC- I am concerned about the possibility of PD and have ordered repeat imaging.  Future options include pembro +/- chemo, or a clinical trial (would need to travel) of capmatinib or tepotinib. For now, cont criz.  QTc from ER unchanged.    Hypoalbuminemia- Advised increased protein intake.  Discussed both diet and supplements such as boost and ensure.    Hyperglycemia:  Deferred to PCP.    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    Follow up: RTC 1w.    The patients questions were answered to her satisfaction.

## 2017-09-07 NOTE — Unmapped (Signed)
[facility] Triage Note     Patient: [name] [name]     Reason for call: Pler-X output    Time call returned: 1327     Phone Assessment: Charlett Nose back to let him know that as long as the patient isn't having any other symptoms that the increase in output is something that can happen. I let him know to keep records and discuss with Dr. Janann August on 07/16. He also reported that the patient has episodes where she feels like her food/drinks aren't going all the way down, it will last for several days and then go away and come back. She is still able to eat and drink during these episodes. I let him know that this could probably be discussed on 07/16 as well but that I would pass the message on to the care team.    Triage Recommendations: None.     Patient Response: Verbalized understanding.     Outstanding tasks: None     Patient Pharmacy has been verified and primary pharmacy has been marked as preferred

## 2017-09-07 NOTE — Unmapped (Signed)
[facility] Triage Note     Patient: [name] [name]     Reason for call: Increased output    Time call returned: 1029     Phone Assessment: The pleur-X is drained every other day, they typically would get about off the left side and 300-375ml off the right side. The last two times she's been about 400-468ml off both sides. Patient reports no fevers, she's been having difficulties swallowing as well, no pain noted on inspiration, she has been coughing a little more than normal, she also hasn't had any trouble breathing, or increased shortness of breath. Next appointment with Dr. Janann August is on 07/16.    Triage Recommendations: Will reach out to care team for recommendations.      Patient Response: Verbalized understanding.     Outstanding tasks: Any recommendations?     Patient Pharmacy has been verified and primary pharmacy has been marked as preferred

## 2017-09-07 NOTE — Unmapped (Signed)
Hi,     Nastassia Bazaldua contacted the [employer] regarding the following:    - Patient's son [name] called to report that amount of fluid being drawn from patient [name] [name]'s PleurX.    Please contact Mr. Balbuena at [phone].    Thanks in advance,    Kelli Hope  [facility]   [phone]

## 2017-09-08 NOTE — Unmapped (Signed)
[facility] Triage Note     Patient: [name] [name]     Reason for call: Discharge    Time call returned: 1502     Phone Assessment: Called patient's son [name] to let him know that he should follow up with patient's PCP or a GYN doctor to workup the vaginal discharge, I let him know that if the bleeding becomes worse or she becomes symptomatic, she may need to go to the ED.     Triage Recommendations: See above     Patient Response: Verbalized understanding.     Outstanding tasks: None     Patient Pharmacy has been verified and primary pharmacy has been marked as preferred

## 2017-09-08 NOTE — Unmapped (Signed)
Hi,     Louise Rawson contacted the [employer] regarding the following:    - Calling to report that his mother, patient [name] [name], is experiencing bloody vaginal discharge.    Please contact Mr. Quintanilla at [phone].    Thanks in advance,    Kelli Hope  [facility]   [phone]

## 2017-09-08 NOTE — Unmapped (Signed)
AOC Triage Note     Patient: [name] [name]     Reason for call: Discharge    Time call returned: 1449     Phone Assessment: [name] reported that the discharge started a week ago, at that time it was light pink discharge and was off/on. Today she had discharge again, however this time it was a dark red color and still was not a large amount of blood. Patient hasn't had any dizziness or lightheadedness, but [name] noted that her balance has been off and she is weaker today than she has been.      Triage Recommendations: Will communicate with care team.     Patient Response: Verbalized understanding.     Outstanding tasks: Any recommendations?     Patient Pharmacy has been verified and primary pharmacy has been marked as preferred

## 2017-09-11 NOTE — Unmapped (Signed)
Hi,     Fatim Vanderschaaf contacted the Communication Center regarding the following:    - Patient [name] has blood around PleurX site and slightly darker,reddish discharge from wound.    Please contact Mr. Mandujano at [phone].    Thanks in advance,    Kelli Hope  [facility]   [phone]

## 2017-09-11 NOTE — Unmapped (Signed)
AOC Triage Note     Patient: Bevely Palmer     Reason for call:  PleurX drainage  Time call returned: 0855     Phone Assessment: Spoke with [name], reports Markia had small amount of rust colored drainage from the Left PleurX this morning.  Stated it was noticed ' at the tip of the drain'.  This first happened 2 days ago and again today.  The amount drained was about 450 - 500 ml.  Reports no SOB or difficulty breathing.  No redness/swelling, or signs of infection at site.  Other than being slightly more lethargic, Neela has been 'doing fine'.      Triage Recommendations: Jake Shark, the drainage most likely is not concerning.  Will message care team so they are aware.  Reviewed 7/16 appt for tomorrow with Dr. Janann August.  States they will be coming.   Patient Response: verbalized understanding   Outstanding tasks: reports small rust colored drainage from L PleurX, will be here for 7/16 appt.     Patient Pharmacy has been verified and primary pharmacy has been marked as preferred

## 2017-09-12 ENCOUNTER — Ambulatory Visit
Admit: 2017-09-12 | Discharge: 2017-09-13 | Payer: MEDICARE | Attending: Hematology & Oncology | Primary: Hematology & Oncology

## 2017-09-12 DIAGNOSIS — C3490 Malignant neoplasm of unspecified part of unspecified bronchus or lung: Principal | ICD-10-CM

## 2017-09-12 DIAGNOSIS — R9389 Abnormal findings on diagnostic imaging of other specified body structures: Secondary | ICD-10-CM

## 2017-09-13 NOTE — Unmapped (Signed)
[facility] Refill Coordination Note  Specialty Medication(s): Xalkori  Additional Medications shipped: n/a    [name] [name], DOB: September 27, 1930  Phone: [phone] (home) , Alternate phone contact: N/A  Phone or address changes today?: No  All above HIPAA information was verified with patient's family member.  Shipping Address: [address] [name]  [place] [address]   Insurance changes? No    Completed refill call assessment today to schedule patient's medication shipment from the [facility] [phone]).      Confirmed the medication and dosage are correct and have not changed: Yes, regimen is correct and unchanged.    Confirmed patient started or stopped the following medications in the past month:  No, there are no changes reported at this time.    Are you tolerating your medication?:  [name] [name] reports tolerating the medication.    ADHERENCE    Is this medicine transplant or covered by Medicare Part B?       Did you miss any doses in the past 4 weeks? Yes.  [name] [name] reports missing 1 days of medication therapy in the last 4 weeks.  [name] [name] reports forgetting as the cause of their non-adherance.    FINANCIAL/SHIPPING    Delivery Scheduled: Yes, Expected medication delivery date: 09/20/17     The patient will receive an FSI print out for each medication shipped and additional FDA Medication Guides as required.  Patient education from [facility] or Robet Leu may also be included in the shipment.    [name] [name] did not have any additional questions at this time.    Delivery address validated in FSI scheduling system: Yes, address listed in FSI is correct.    We will follow up with patient monthly for standard refill processing and delivery.      Thank you,  [name] Vangie Bicker   [facility] Specialty Pharmacist

## 2017-09-15 ENCOUNTER — Ambulatory Visit: Admit: 2017-09-15 | Discharge: 2017-09-16 | Payer: MEDICARE

## 2017-09-15 DIAGNOSIS — C3490 Malignant neoplasm of unspecified part of unspecified bronchus or lung: Principal | ICD-10-CM

## 2017-09-15 DIAGNOSIS — R930 Abnormal findings on diagnostic imaging of skull and head, not elsewhere classified: Secondary | ICD-10-CM | POA: Diagnosis not present

## 2017-09-15 DIAGNOSIS — R937 Abnormal findings on diagnostic imaging of other parts of musculoskeletal system: Secondary | ICD-10-CM | POA: Diagnosis not present

## 2017-09-15 DIAGNOSIS — G939 Disorder of brain, unspecified: Secondary | ICD-10-CM | POA: Diagnosis not present

## 2017-09-15 DIAGNOSIS — R918 Other nonspecific abnormal finding of lung field: Secondary | ICD-10-CM | POA: Diagnosis not present

## 2017-09-15 DIAGNOSIS — C3492 Malignant neoplasm of unspecified part of left bronchus or lung: Secondary | ICD-10-CM | POA: Diagnosis not present

## 2017-09-15 DIAGNOSIS — I313 Pericardial effusion (noninflammatory): Secondary | ICD-10-CM | POA: Diagnosis not present

## 2017-09-17 MED FILL — XALKORI/250MG/CAPS: XALKORI/250MG/CAPS | 30 days supply | Qty: 60 | Fill #3

## 2017-09-19 ENCOUNTER — Ambulatory Visit: Admit: 2017-09-19 | Discharge: 2017-09-19 | Payer: MEDICARE | Attending: Pulmonary Disease | Primary: Pulmonary Disease

## 2017-09-19 ENCOUNTER — Ambulatory Visit
Admit: 2017-09-19 | Discharge: 2017-09-19 | Payer: MEDICARE | Attending: Hematology & Oncology | Primary: Hematology & Oncology

## 2017-09-19 ENCOUNTER — Other Ambulatory Visit: Admit: 2017-09-19 | Discharge: 2017-09-19 | Payer: MEDICARE

## 2017-09-19 DIAGNOSIS — R601 Generalized edema: Secondary | ICD-10-CM

## 2017-09-19 DIAGNOSIS — C3490 Malignant neoplasm of unspecified part of unspecified bronchus or lung: Principal | ICD-10-CM

## 2017-09-19 DIAGNOSIS — R9389 Abnormal findings on diagnostic imaging of other specified body structures: Secondary | ICD-10-CM

## 2017-09-19 DIAGNOSIS — J90 Pleural effusion, not elsewhere classified: Principal | ICD-10-CM

## 2017-09-19 LAB — CBC W/ AUTO DIFF
BASOPHILS ABSOLUTE COUNT: 0 10*9/L (ref 0.0–0.1)
BASOPHILS RELATIVE PERCENT: 0.3 %
EOSINOPHILS ABSOLUTE COUNT: 0.2 10*9/L (ref 0.0–0.4)
EOSINOPHILS RELATIVE PERCENT: 3.3 %
HEMATOCRIT: 27.5 % — ABNORMAL LOW (ref 36.0–46.0)
HEMOGLOBIN: 9.9 g/dL — ABNORMAL LOW (ref 12.0–16.0)
LARGE UNSTAINED CELLS: 2 % (ref 0–4)
LYMPHOCYTES ABSOLUTE COUNT: 1.1 10*9/L — ABNORMAL LOW (ref 1.5–5.0)
LYMPHOCYTES RELATIVE PERCENT: 21.2 %
MEAN CORPUSCULAR HEMOGLOBIN CONC: 35.9 g/dL (ref 31.0–37.0)
MEAN CORPUSCULAR HEMOGLOBIN: 23.4 pg — ABNORMAL LOW (ref 26.0–34.0)
MEAN CORPUSCULAR VOLUME: 65.1 fL — ABNORMAL LOW (ref 80.0–100.0)
MEAN PLATELET VOLUME: 7.3 fL (ref 7.0–10.0)
MONOCYTES ABSOLUTE COUNT: 0.5 10*9/L (ref 0.2–0.8)
NEUTROPHILS ABSOLUTE COUNT: 3.4 10*9/L (ref 2.0–7.5)
NEUTROPHILS RELATIVE PERCENT: 64.2 %
PLATELET COUNT: 361 10*9/L (ref 150–440)
RED BLOOD CELL COUNT: 4.23 10*12/L (ref 4.00–5.20)
RED CELL DISTRIBUTION WIDTH: 20 % — ABNORMAL HIGH (ref 12.0–15.0)
WBC ADJUSTED: 5.2 10*9/L (ref 4.5–11.0)

## 2017-09-19 LAB — CO2: Carbon dioxide:SCnc:Pt:Ser/Plas:Qn:: 31 — ABNORMAL HIGH

## 2017-09-19 LAB — THYROID STIMULATING HORMONE: Thyrotropin:ACnc:Pt:Ser/Plas:Qn:: 3.96 — ABNORMAL HIGH

## 2017-09-19 LAB — COMPREHENSIVE METABOLIC PANEL
ALBUMIN: 2 g/dL — ABNORMAL LOW (ref 3.5–5.0)
ALKALINE PHOSPHATASE: 135 U/L — ABNORMAL HIGH (ref 38–126)
ALT (SGPT): 31 U/L (ref 15–48)
ANION GAP: 5 mmol/L — ABNORMAL LOW (ref 9–15)
AST (SGOT): 19 U/L (ref 14–38)
BILIRUBIN TOTAL: 0.2 mg/dL (ref 0.0–1.2)
BLOOD UREA NITROGEN: 16 mg/dL (ref 7–21)
CALCIUM: 8.2 mg/dL — ABNORMAL LOW (ref 8.5–10.2)
CO2: 31 mmol/L — ABNORMAL HIGH (ref 22.0–30.0)
CREATININE: 0.79 mg/dL (ref 0.60–1.00)
EGFR CKD-EPI AA FEMALE: 79 mL/min/{1.73_m2} (ref >=60)
EGFR CKD-EPI NON-AA FEMALE: 69 mL/min/{1.73_m2} (ref >=60)
GLUCOSE RANDOM: 159 mg/dL (ref 65–179)
PROTEIN TOTAL: 4.9 g/dL — ABNORMAL LOW (ref 6.5–8.3)
SODIUM: 138 mmol/L (ref 135–145)

## 2017-09-19 LAB — SLIDE REVIEW

## 2017-09-19 LAB — LYMPHOCYTES ABSOLUTE COUNT: Lab: 1.1 — ABNORMAL LOW

## 2017-09-19 LAB — PRO-BNP: Natriuretic peptide.B prohormone N-Terminal:MCnc:Pt:Ser/Plas:Qn:: 335

## 2017-09-19 LAB — TARGET CELLS

## 2017-09-19 NOTE — Unmapped (Signed)
You were seen today for follow-up of your PleurX catheters.    On ultrasound today, it shows that your fluid has completely drained on both sides.     If you drain less than 75 mL three times in a row, call us and we would see you again and would consider taking out the tube if all the fluid has drained.    Thank you for allowing me to participate in your care. Please do not hesitate to contact me with issues or questions.     Tiana Loft MD  Evalyn Casco, MD  Mercy Moore, MD    Interventional Pulmonary Fellow: Catalina Gravel, DO    During business hours please contact me via Tammy Allred at [phone] or page her at [phone].    For urgent issues after hours please call [phone] [facility] Operator) and ask to speak with the Adult Pulmonary Fellow on call.

## 2017-09-19 NOTE — Unmapped (Signed)
HPI:  [name] [name] is an 84 yo F, never smoker, who initially became symptomatic  with dyspnea and cough in late Feb 2019.   History as follows:  Oncology History    - 04/30/17: Presented to ED with dyspnea for over 2 weeks.  CTA with PEs, b/l pleural effusions, mediastinal soft tissue mass 9/5/7cm, invading LUL nd 8.2cm lesion inleft hepatic lobe  - 05/17/17: b/l thoracentesis with 2L removed each lung.  Cytology with no malignant cells but lymphocyte predominant exudative effusiions  - 05/18/17: Biopsy liver lesion c/w lung adenocarcinoma        She lives with her her 2 teenage grandsons and has attentive sons/daughter close by.  Her daughter-in-law has been sleeping overnight with her since diagnosis.  She dresses herself, showers, able to do ADLs but family does most of cooking, grocery shopping and cleaning.  Her son manages her medicines and finances. She is otherwise feeling reasonable well.  Has some fatigue. No falls. Does not use a walker or cane.  She is able to walk around [place] but after exertion like this, has some chest pressure and has to sit down to rest, with resolution usually by .  No stairs at home.  She has insulin dependent DM, HTN and Afib, controlled with meds.      Interval Hx  Initiated crizotinib on 06/23/17 and dose reduced to 250mg  QD on 07/17/17 and then went back up again to BID on 07/24/17.  Breathing well.  Pleurx draining a little less this morning.  Wants to know if her shaking could be seizure activity.    ROS:  10 systems were reviewed and found to be within normal limits except as described in the HPI.    MEDICATIONS:  Current Outpatient Medications on File Prior to Visit   Medication Sig Dispense Refill   ??? amLODIPine (NORVASC) 5 MG tablet Take 1 tablet (5 mg total) by mouth daily. 90 tablet 0   ??? [EXPIRED] apixaban (ELIQUIS) 5 mg Tab Take 1 tablet (5 mg total) by mouth Two (2) times a day. 180 tablet 3   ??? blood sugar diagnostic (ACCU-CHEK AVIVA) Strp      ??? calcium carbonate-vitamin D3 (CALTRATE 600 + D) 600 mg (1,500 mg)-800 unit Chew Chew 1 tablet Two (2) times a day.     ??? crizotinib (XALKORI) 250 mg capsule Take 1 capsule (250 mg total) by mouth Two (2) times a day. 60 capsule 3   ??? flash glucose scanning reader 1 each.     ??? flash glucose sensor kit 3 each.     ??? furosemide (LASIX) 20 MG tablet Take 20 mg by mouth daily as needed for swelling.     ??? insulin aspart prot/insuln asp (NOVOLOG MIX 70-30 FLEXPEN SUBQ) Inject 10 Units under the skin Two (2) times a day (30 minutes before a meal).     ??? lidocaine 4 % patch Place 1 patch on the skin daily.     ??? losartan (COZAAR) 100 MG tablet Take 1 tablet (100 mg total) by mouth daily. 90 tablet 3   ??? metFORMIN (GLUCOPHAGE-XR) 500 MG 24 hr tablet TAKE 2 TABLETS (1,000 MG TOTAL) BY MOUTH TWO (2) TIMES A DAY. 360 tablet 3   ??? pen needle, diabetic (BD ULTRA-FINE SHORT PEN NEEDLE) 31 gauge x 5/16 Ndle USE AS DIRECTED. USE 4 NEEDLES A DAY WITH DIABETIC MEDICATION PENS AS DIRECTED     ??? potassium chloride SA (K-DUR,KLOR-CON) 20 MEQ tablet Take 1 tablet (  20 mEq total) by mouth daily. 90 tablet 1   ??? ranitidine (ZANTAC) 150 MG tablet Take 1 tablet (150 mg total) by mouth Two (2) times a day. 180 tablet 3   ??? simethicone (GAS-X) 80 MG chewable tablet Chew 1 tablet (80 mg total) 4 (four) times a day as needed for flatulence (gas relief). 100 tablet 2   ??? sucralfate (CARAFATE) 1 gram tablet Take 1 tablet (1 g total) by mouth Four (4) times a day. 120 tablet 0   ??? [EXPIRED] traMADol (ULTRAM) 50 mg tablet Take 1 tablet (50 mg total) by mouth every six (6) hours as needed for pain. for up to 5 days 20 tablet 0     No current facility-administered medications on file prior to visit.        ALLERGIES:   Allergies   Allergen Reactions   ??? Sulfasalazine Nausea Only       PMH:  Past Medical History:   Diagnosis Date   ??? Atrial fibrillation (CMS-HCC)    ??? Cancer (CMS-HCC)    ??? Diabetes mellitus (CMS-HCC)    ??? Hypertension    ??? Metastatic lung cancer (metastasis from lung to other site) (CMS-HCC)    ??? Pulmonary embolism (CMS-HCC)        SOCIAL HISTORY:  Lives with her 2 teenage grandsons. Her sons, daughter live nearby.    Never smoker  No alcohol    FAMILY HISTORY:  No hx of cancer in her parents.  Daughter has breast cancer.  Another daughter died of lung cancer and was a heavy smoker for many years.     PHYSICAL EXAMINATION:  BP 91/53  - Pulse 81  - Temp 36.8 ??C (98.2 ??F) (Oral)  - Resp 18  - Ht 162.6 cm (5' 4)  - Wt 57.5 kg (126 lb 12.8 oz)  - SpO2 100%  - BMI 21.77 kg/m??      ECOG 1  GENERAL: Thin, pleasant woman not in acute distress  MOUTH: Moist and clear without thrush  NECK: Supple  LYMPH: No cervical or supraclavicular lymphadenopathy  CHEST: CTAB  CV: Nl s1/s2  ABD: Soft, not tender, not distended, no palpable organomegaly  EXTREMITIES: Warm and well perfused x 4 without cyanosis, clubbing.  RLE unchanged (has previuosly had doppler US)  MUSKOLOSKELETAL: No spinal tenderness to palpation  NEURO: CN2-12 intact.  Speech and gait within normal limits  AFFECT: Varied and appropriate to situation  DERM: No lesions noted    LABS:  Results for TYANNA, HACH (MRN [id number]) as of 09/19/2017 15:43   Ref. Range 09/19/2017 14:12   WBC Latest Ref Range: 4.5 - 11.0 10*9/L 5.2   RBC Latest Ref Range: 4.00 - 5.20 10*12/L 4.23   HGB Latest Ref Range: 12.0 - 16.0 g/dL 9.9 (L)   HCT Latest Ref Range: 36.0 - 46.0 % 27.5 (L)   MCV Latest Ref Range: 80.0 - 100.0 fL 65.1 (L)   MCH Latest Ref Range: 26.0 - 34.0 pg 23.4 (L)   MCHC Latest Ref Range: 31.0 - 37.0 g/dL 81.1   RDW Latest Ref Range: 12.0 - 15.0 % 20.0 (H)   MPV Latest Ref Range: 7.0 - 10.0 fL 7.3   Platelet Latest Ref Range: 150 - 440 10*9/L 361   Neutrophils % Latest Units: % 64.2   Lymphocytes % Latest Units: % 21.2   Monocytes % Latest Units: % 9.0   Eosinophils % Latest Units: % 3.3   Basophils % Latest Units: %  0.3   Absolute Neutrophils Latest Ref Range: 2.0 - 7.5 10*9/L 3.4   Absolute Lymphocytes Latest Ref Range: 1.5 - 5.0 10*9/L 1.1 (L)   Absolute Monocytes  Latest Ref Range: 0.2 - 0.8 10*9/L 0.5   Absolute Eosinophils Latest Ref Range: 0.0 - 0.4 10*9/L 0.2   Absolute Basophils  Latest Ref Range: 0.0 - 0.1 10*9/L 0.0   Microcytosis Latest Ref Range: Not Present  Marked (A)   Anisocytosis Latest Ref Range: Not Present  Moderate (A)   Hyperchromasia Latest Ref Range: Not Present  Moderate (A)   Smear Review Latest Ref Range: Undefined  See Comment (A)   Poikilocytosis Latest Ref Range: Not Present  Marked (A)   Target Cells Latest Ref Range: Not Present  Moderate (A)   Large Unstained Cells Latest Ref Range: 0 - 4 % 2   Sodium Latest Ref Range: 135 - 145 mmol/L 138   Potassium Latest Ref Range: 3.5 - 5.0 mmol/L 4.4   Chloride Latest Ref Range: 98 - 107 mmol/L 102   CO2 Latest Ref Range: 22.0 - 30.0 mmol/L 31.0 (H)   Bun Latest Ref Range: 7 - 21 mg/dL 16   Creatinine Latest Ref Range: 0.60 - 1.00 mg/dL 1.61   BUN/Creatinine Ratio Unknown 20   EGFR CKD-EPI African American, Female Latest Ref Range: >=60 mL/min/1.85m2 79   EGFR CKD-EPI Non-African American, Female Latest Ref Range: >=60 mL/min/1.40m2 69   Anion Gap Latest Ref Range: 9 - 15 mmol/L 5 (L)   Glucose Latest Ref Range: 65 - 179 mg/dL 096   Calcium Latest Ref Range: 8.5 - 10.2 mg/dL 8.2 (L)   Albumin Latest Ref Range: 3.5 - 5.0 g/dL 2.0 (L)   Total Protein Latest Ref Range: 6.5 - 8.3 g/dL 4.9 (L)   Total Bilirubin Latest Ref Range: 0.0 - 1.2 mg/dL 0.2   AST Latest Ref Range: 14 - 38 U/L 19   ALT Latest Ref Range: 15 - 48 U/L 31   Alkaline Phosphatase Latest Ref Range: 38 - 126 U/L 135 (H)   TSH Latest Ref Range: 0.600 - 3.300 uIU/mL 3.960 (H)         RADIOLOGY:  MRI:   - No new enhancing parenchymal lesions compared to 07/19/2017. Redemonstration of enhancing focus in the right frontal operculum. The right parietal lesion is no longer enhancing.  -Stable appearance of the left frontoparietal calvarial metastasis with extension into the epidural space.     CT:   Interval mild decrease in size of bilateral left upper lobe pulmonary masses, compatible with treatment response.  Interval increase in size of moderate pericardial effusion. Correlate with echocardiogram.  Interval development of vertebral plana of T12 vertebral body likely secondary to metastasis.    PATHOLOGY:  Final Diagnosis 05/19/17   A: Liver, lesion, needle core biopsy   - Metastatic poorly differentiated carcinoma, consistent with adenocarcinoma of lung origin (see ancillary studies)     NOTE: PDL-1 IHC on liver biopsy 04%    STRATA NGS Liver 05/31/17  MET exon 14  RB1 p.W681  TP53 p.L194F  MSS  TMB ??? Low; Mutations per MB: 7  PD-L1 ??? 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   Laboratory results were reviewed and found to be acceptable.  Images and imaging reports were reviewed and show SD.  54m FU imaging ordered.  Given absence of SOB, will  monitor pericardial effusion; in case of dyspnea, will get TTE.  Advised increased protein intake again for hypoalbuminemia.  The patients questions were answered to her satisfaction.

## 2017-09-19 NOTE — Unmapped (Signed)
INTERVENTIONAL PULMONARY CLINIC FOLLOW-UP PATIENT EVALUATION    Assessment:     Patient:Lesha V Demars (17-Dec-1930)  Reason for visit: Ms.Thorington is a 84 y.o.female who returns for PleurX evaluation.    Plan:     1. No pleural effusions on bedside ultrasound after drainage at home today. Continue to drain every other day and call us if output decreases below 75 mL for three consecutive drainages.    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    As reported by her son, Ms. Bowron's daughter in law noted that the right side PleurX catheter was sluggish and had 250-300 drained this morning. She is concerned about the decrease in drainage from 400 mL previously. Patient denies pain or chest tightness with drainage. She says draining her catheters makes her feel better.    Left side drained 400 this morning with no difficulty.    Ms. Fawley denies fevers, chills, night sweats, cough, sputum production, pain or redness or dainage at catheter sites on either side. She fells well overall.       Review of Systems  A 12 point review of systems was negative except for pertinent items noted in the HPI.    Past Medical History:   Diagnosis Date   ??? Atrial fibrillation (CMS-HCC)    ??? Cancer (CMS-HCC)    ??? Diabetes mellitus (CMS-HCC)    ??? Hypertension    ??? Metastatic lung cancer (metastasis from lung to other site) (CMS-HCC)    ??? Pulmonary embolism (CMS-HCC)        Past Surgical History:   Procedure Laterality Date   ??? CHG RAD GUIDED,PERCUT DRAINAGE,W/CATH PLACE Right 06/19/2017    Procedure: RADIOLOGICAL GUIDANCE, FOR PERCUTANEOUS DRAINAGE, WITH PLACEMENT OF CATHETER, RAD S&I;  Surgeon: Sherwood Gambler, MD;  Location: [facility]ON[facility] PROCEDURE LAB [facility];  Service: Pulmonary   ??? CHOLECYSTECTOMY     ??? HYSTERECTOMY     ??? neck surgey     ??? PR INSERTION INDWELLING TUNNELED PLEURAL CATHETER Bilateral 06/19/2017    Procedure: INSERTION OF INDWELLING TUNNELED PLEURAL CATHETER WITH CUFF WITH MODERATE SEDATION;  Surgeon: Sherwood Gambler, MD;  Location: [facility] PROCEDURE LAB [facility];  Service: Pulmonary   ??? PR MOD SED SAME PHYS/QHP EACH ADDL 15 MINS  06/19/2017    Procedure: MODERATE SEDATION SERVICES PROVIDED BY SAME PHYSICIAN OR OTHER QUALIFIED HEALTH CARE PROFESSIONAL PERFORMING THE DIAGNOSTIC OR THERAPEUTIC SERVICE THAT SEDATION SUPPORTS, EACH ADDITIONAL 15 MINS;  Surgeon: Sherwood Gambler, MD;  Location: BRONCH PROCEDURE LAB [facility];  Service: Pulmonary   ??? PR MOD SED SAME PHYS/QHP INITIAL 15 MINS 5/> YRS  06/19/2017    Procedure: MODERATE SEDATION SERVICES PROVIDED BY SAME PHYSICIAN OR OTHER QUALIFIED HC PROFESSIONAL PERFORMING THE DIAGNOSTIC OR THERAPEUTIC SERVICE THAT SEDATION SUPPORTS, INIT 15 MINS, PT AGE 51 YEARS OR OLDER;  Surgeon: Sherwood Gambler, MD;  Location: [facility]ON[facility] PROCEDURE LAB [facility];  Service: Pulmonary       Current Outpatient Medications   Medication Sig Dispense Refill   ??? amLODIPine (NORVASC) 5 MG tablet Take 1 tablet (5 mg total) by mouth daily. 90 tablet 0   ??? apixaban (ELIQUIS) 5 mg Tab Take 1 tablet (5 mg total) by  mouth Two (2) times a day. 180 tablet 3   ??? blood sugar diagnostic (ACCU-CHEK AVIVA) Strp      ??? calcium carbonate-vitamin D3 (CALTRATE 600 + D) 600 mg (1,500 mg)-800 unit Chew Chew 1 tablet Two (2) times a day.     ??? crizotinib (XALKORI) 250 mg capsule Take 1 capsule (250 mg total) by mouth Two (2) times a day. 60 capsule 3   ??? flash glucose scanning reader 1 each.     ??? flash glucose sensor kit 3 each.     ??? furosemide (LASIX) 20 MG tablet Take 20 mg by mouth daily as needed for swelling.     ??? insulin aspart prot/insuln asp (NOVOLOG MIX 70-30 FLEXPEN SUBQ) Inject 10 Units under the skin Two (2) times a day (30 minutes before a meal).     ??? lidocaine 4 % patch Place 1 patch on the skin daily.     ??? losartan (COZAAR) 100 MG tablet Take 1 tablet (100 mg total) by mouth daily. 90 tablet 3   ??? metFORMIN (GLUCOPHAGE-XR) 500 MG 24 hr tablet TAKE 2 TABLETS (1,000 MG TOTAL) BY MOUTH TWO (2) TIMES A DAY. 360 tablet 3   ??? pen needle, diabetic (BD ULTRA-FINE SHORT PEN NEEDLE) 31 gauge x 5/16 Ndle USE AS DIRECTED. USE 4 NEEDLES A DAY WITH DIABETIC MEDICATION PENS AS DIRECTED     ??? potassium chloride SA (K-DUR,KLOR-CON) 20 MEQ tablet Take 1 tablet (20 mEq total) by mouth daily. 90 tablet 1   ??? ranitidine (ZANTAC) 150 MG tablet Take 1 tablet (150 mg total) by mouth Two (2) times a day. 180 tablet 3   ??? simethicone (GAS-X) 80 MG chewable tablet Chew 1 tablet (80 mg total) 4 (four) times a day as needed for flatulence (gas relief). 100 tablet 2   ??? sucralfate (CARAFATE) 1 gram tablet Take 1 tablet (1 g total) by mouth Four (4) times a day. 120 tablet 0     No current facility-administered medications for this visit.        Allergies as of 09/19/2017 - Reviewed 09/19/2017   Allergen Reaction Noted   ??? Sulfasalazine Nausea Only 12/22/2014       Family History   Problem Relation Age of Onset   ??? Cancer Brother         lung   ??? Cancer Daughter         lung   ??? Cancer Daughter         breast       Social History     Socioeconomic History   ??? Marital status: Widowed     Spouse name: Not on file   ??? Number of children: Not on file   ??? Years of education: Not on file   ??? Highest education level: Not on file   Occupational History   ??? Not on file   Social Needs   ??? Financial resource strain: Not on file   ??? Food insecurity:     Worry: Not on file     Inability: Not on file   ??? Transportation needs:     Medical: Not on file     Non-medical: Not on file   Tobacco Use   ??? Smoking status: Never Smoker   ??? Smokeless tobacco: Never Used   Substance and Sexual Activity   ??? Alcohol use: No   ??? Drug use: Never   ??? Sexual activity: Not on file   Lifestyle   ???  Physical activity:     Days per week: Not on file     Minutes per session: Not on file   ??? Stress: Not on file   Relationships   ??? Social connections:     Talks on phone: Not on file     Gets together: Not on file     Attends religious service: Not on file     Active member of club or organization: Not on file     Attends meetings of clubs or organizations: Not on file     Relationship status: Not on file   Other Topics Concern   ??? Do you use sunscreen? No   ??? Tanning bed use? No   ??? Are you easily burned? No   ??? Excessive sun exposure? No   ??? Blistering sunburns? No   Social History Narrative   ??? Not on file       Objective:     There were no vitals filed for this visit.  General: Alert, well-appearing, and in no distress.  Eyes: Anicteric sclera, conjunctiva clear.  ENT:  Nares normal, septum midline, mucosa normal, no drainage or sinus tenderness.  Lymph: No cervical or supraclavicular adenopathy.  Lungs: Normal excursion, no dullness to percussion. Good air movement bilaterally, without wheezes or crackles. Normal upper airway sounds without evidence of stridor.  Cardiovascular: Regular rate and rhythm, S1, S2 normal, no murmur, click, rub or gallop appreciated.  Abdomen: bowel sounds are normal, liver is not enlarged, spleen is not enlarged  Musculoskeletal: No clubbing and no synovitis.  Skin: No rashes or lesions.  Neuro: No focal neurological deficits.      Diagnostic Review:     Bedside ultrasound reavealed no pleural effusion bilaterally.

## 2017-09-19 NOTE — Unmapped (Signed)
1410 Labs drawn and sent for analysis.  Care provided by [facility]

## 2017-09-21 DIAGNOSIS — J910 Malignant pleural effusion: Secondary | ICD-10-CM | POA: Diagnosis not present

## 2017-09-24 NOTE — Unmapped (Signed)
Heme/Onc Request Note    Ms. 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  Her son called because she told him that she is having scant blood on the toilet paper when she wipes.  This occurs with both urination and stool.  She thinks it is from skin irritation.  Denies any gross hematuria, clots in the urine, hematochezia, melena, lightheadedness, palpitations, or presyncopal symptoms.  She took her Eliquis this morning.  I told her son that this is not a medical emergency, that she should continue to take her Eliquis given the indication for PE, and that I would notify her primary oncologist.       Fellow Triaging Request:  Lucia Estelle

## 2017-09-26 ENCOUNTER — Ambulatory Visit: Admit: 2017-09-26 | Discharge: 2017-09-27 | Payer: MEDICARE | Attending: Family Medicine | Primary: Family Medicine

## 2017-09-26 DIAGNOSIS — I10 Essential (primary) hypertension: Secondary | ICD-10-CM

## 2017-09-26 DIAGNOSIS — R319 Hematuria, unspecified: Secondary | ICD-10-CM

## 2017-09-26 DIAGNOSIS — E119 Type 2 diabetes mellitus without complications: Principal | ICD-10-CM

## 2017-09-26 DIAGNOSIS — Z794 Long term (current) use of insulin: Secondary | ICD-10-CM

## 2017-09-26 LAB — URINALYSIS WITH CULTURE REFLEX
BILIRUBIN UA: NEGATIVE
BLOOD UA: NEGATIVE
HYALINE CASTS: 1 /LPF (ref 0–1)
KETONES UA: NEGATIVE
LEUKOCYTE ESTERASE UA: NEGATIVE
NITRITE UA: NEGATIVE
PH UA: 5.5 (ref 5.0–9.0)
PROTEIN UA: NEGATIVE
RBC UA: <1 /HPF (ref <=4)
SQUAMOUS EPITHELIAL: 3 /HPF (ref 0–5)
UROBILINOGEN UA: 0.2
WBC UA: 1 /HPF (ref 0–5)

## 2017-09-26 LAB — ALBUMIN / CREATININE URINE RATIO: CREATININE, URINE: 74.3 mg/dL

## 2017-09-26 LAB — ALBUMIN/CREATININE RATIO: Albumin/Creatinine:MRto:Pt:Urine:Qn:: 0

## 2017-09-26 LAB — SPECIFIC GRAVITY UA: Lab: 1.017

## 2017-09-26 NOTE — Unmapped (Signed)
Pt currently on eliquis.  Ordered UA w/ reflex UCx today

## 2017-09-26 NOTE — Unmapped (Signed)
A1c improved but with reported erratic readings at home.  No glucometer today. Asked to bring to f/u visit.  DECREASE Novolog 70-30 from 10 to 9u BID. Will consider longer-acting option if still having issues with erratic BGs.  CONTINUE metformin xr 500mg  BID d/t GI distress.  Referred to our nutritionist for insulin titration/education and CGM consideration.      Lab Results   Component Value Date    A1C 8.1 (A) 09/26/2017    A1C 9.6 (A) 08/22/2017    A1C 10.3 (A) 08/01/2017    A1C 8.1 (H) 05/19/2017

## 2017-09-26 NOTE — Unmapped (Signed)
Soft pressures  STOP amlodipine 5mg   Continue losartan 100mg  daily, lasix prn, and kdur  Will continue to decrease as tolerated    BP Readings from Last 3 Encounters:   09/26/17 91/56   09/19/17 91/53   09/12/17 95/53

## 2017-09-26 NOTE — Unmapped (Signed)
Assessment and Plan  [name] [name] is a 84 y.o. female with a PMH as above who presents to the clinic for the following:    Problem List Items Addressed This Visit     HTN (hypertension)     Soft pressures  STOP amlodipine 5mg   Continue losartan 100mg  daily, lasix prn, and kdur  Will continue to decrease as tolerated    BP Readings from Last 3 Encounters:   09/26/17 91/56   09/19/17 91/53   09/12/17 95/53              Diabetes mellitus, type 2 (CMS-HCC) - Primary     A1c improved but with reported erratic readings at home.  No glucometer today. Asked to bring to f/u visit.  DECREASE Novolog 70-30 from 10 to 9u BID. Will consider longer-acting option if still having issues with erratic BGs.  CONTINUE metformin xr 500mg  BID d/t GI distress.  Referred to our nutritionist for insulin titration/education and CGM consideration.      Lab Results   Component Value Date    A1C 8.1 (A) 09/26/2017    A1C 9.6 (A) 08/22/2017    A1C 10.3 (A) 08/01/2017    A1C 8.1 (H) 05/19/2017             Relevant Orders    HM DIABETES FOOT EXAM (Completed)    Albumin/creatinine urine ratio    POCT glycosylated hemoglobin (Hb A1C) (Completed)    Amb Referral to Diabetes Ed/Med Nutr The    Hematuria     Pt currently on eliquis.  Ordered UA w/ reflex UCx today         Relevant Orders    Urinalysis with Culture Reflex          Return in about 4 weeks (around 10/24/2017), or if symptoms worsen or fail to improve, for Next scheduled follow up.    [name]  Family Medicine  09/26/2017    Chief Complaint  Chief Complaint   Patient presents with   ??? Chronic Condition Follow-Up       HPI  [name] [name] is a 84 y.o. female with a PMH as below who presents for the following:    HTN  Currently taking amlodipine 5mg , losartan 100mg , lasix 20mg  prn and potassium supplement.   - BP continues to be soft. On the last visit we decreased amlodipine from 10 to 5mg  which has not helped.  BP Readings from Last 3 Encounters:   09/26/17 91/56 09/19/17 91/53   09/12/17 95/53     DM2  - Pts was previously followed by endo at [facility].  - Son reports BGs have been erratic over the past 3 days. Forgot glucometer at home. On last visit, novolog 70-30 was increased from 5u qAM and 10u qPM to 10u BID. He had 2 isolated low BGs which were effective treated with orange juice.  - Also taking metformin xr 500mg  BID. Was on 1000mg  BID which her son decreased to 500mg  BID.  Lab Results   Component Value Date    A1C 8.1 (A) 09/26/2017    A1C 9.6 (A) 08/22/2017    A1C 10.3 (A) 08/01/2017    A1C 8.1 (H) 05/19/2017      Son reports hematuria after wiping urine. Has noticed pink urine but none in the toilet bowl. No vaginal or GI bleeding. Denies fever, chills, abdominal/flank/pelvic pain, dysuria, vaginal itching/lesions.    Last OV  Chest pain  Pt reports chest pain from  ED visit has resolved.     Thoracic compression fracture  Pt reports having occassional back pain.She currently takes 1000mg  otc Tylenol and Salonpas for pain relief.     Acute pulmonary embolism  Emboli in right upper lobe segmental and subsegmental arteries 04/30/17, taking apixaban BID.     Metastatic lung cancer  Left upper lung 10cm mass found 04/30/17 with 2.7cm right upper lobe nodule. With mediastinal lymphadenopathy and hepatic lobe mass. Liver lesion biopsied 3/21 confirmed adenocarcinoma of lung.   - upcoming f/u for pleural effusion. Stop eliquis as recommend.  - No cardiopulm complaints - admission sxs of CP/DOE have resolved      Last OV  Anemia  Son reports pt has hx of anemia; no iron studies on file.     Past Medical History:   Diagnosis Date   ??? Atrial fibrillation (CMS-HCC)    ??? Cancer (CMS-HCC)    ??? Diabetes mellitus (CMS-HCC)    ??? Hypertension    ??? Metastatic lung cancer (metastasis from lung to other site) (CMS-HCC)    ??? Pulmonary embolism (CMS-HCC)      Current Outpatient Medications on File Prior to Visit   Medication Sig Dispense Refill   ??? blood sugar diagnostic (ACCU-CHEK AVIVA) Strp      ??? calcium carbonate-vitamin D3 (CALTRATE 600 + D) 600 mg (1,500 mg)-800 unit Chew Chew 1 tablet Two (2) times a day.     ??? crizotinib (XALKORI) 250 mg capsule Take 1 capsule (250 mg total) by mouth Two (2) times a day. 60 capsule 3   ??? furosemide (LASIX) 20 MG tablet Take 20 mg by mouth daily as needed for swelling.     ??? insulin aspart prot/insuln asp (NOVOLOG MIX 70-30 FLEXPEN SUBQ) Inject 9 Units under the skin Two (2) times a day (30 minutes before a meal).     ??? lidocaine 4 % patch Place 1 patch on the skin daily.     ??? losartan (COZAAR) 100 MG tablet Take 1 tablet (100 mg total) by mouth daily. 90 tablet 3   ??? metFORMIN (GLUCOPHAGE-XR) 500 MG 24 hr tablet TAKE 2 TABLETS (1,000 MG TOTAL) BY MOUTH TWO (2) TIMES A DAY. 360 tablet 3   ??? pen needle, diabetic (BD ULTRA-FINE SHORT PEN NEEDLE) 31 gauge x 5/16 Ndle USE AS DIRECTED. USE 4 NEEDLES A DAY WITH DIABETIC MEDICATION PENS AS DIRECTED     ??? potassium chloride SA (K-DUR,KLOR-CON) 20 MEQ tablet Take 1 tablet (20 mEq total) by mouth daily. 90 tablet 1   ??? ranitidine (ZANTAC) 150 MG tablet Take 1 tablet (150 mg total) by mouth Two (2) times a day. 180 tablet 3   ??? simethicone (GAS-X) 80 MG chewable tablet Chew 1 tablet (80 mg total) 4 (four) times a day as needed for flatulence (gas relief). 100 tablet 2   ??? sucralfate (CARAFATE) 1 gram tablet Take 1 tablet (1 g total) by mouth Four (4) times a day. 120 tablet 0   ??? [EXPIRED] apixaban (ELIQUIS) 5 mg Tab Take 1 tablet (5 mg total) by mouth Two (2) times a day. 180 tablet 3   ??? [EXPIRED] traMADol (ULTRAM) 50 mg tablet Take 1 tablet (50 mg total) by mouth every six (6) hours as needed for pain. for up to 5 days 20 tablet 0     No current facility-administered medications on file prior to visit.      Social History     Socioeconomic History   ??? Marital status:  Widowed     Spouse name: Not on file   ??? Number of children: Not on file   ??? Years of education: Not on file   ??? Highest education level: Not on file   Occupational History   ??? Not on file   Social Needs   ??? Financial resource strain: Not on file   ??? Food insecurity:     Worry: Not on file     Inability: Not on file   ??? Transportation needs:     Medical: Not on file     Non-medical: Not on file   Tobacco Use   ??? Smoking status: Never Smoker   ??? Smokeless tobacco: Never Used   Substance and Sexual Activity   ??? Alcohol use: No   ??? Drug use: Never   ??? Sexual activity: Not on file   Lifestyle   ??? Physical activity:     Days per week: Not on file     Minutes per session: Not on file   ??? Stress: Not on file   Relationships   ??? Social connections:     Talks on phone: Not on file     Gets together: Not on file     Attends religious service: Not on file     Active member of club or organization: Not on file     Attends meetings of clubs or organizations: Not on file     Relationship status: Not on file   Other Topics Concern   ??? Do you use sunscreen? No   ??? Tanning bed use? No   ??? Are you easily burned? No   ??? Excessive sun exposure? No   ??? Blistering sunburns? No   Social History Narrative   ??? Not on file       The following portions of the patient's history were reviewed and updated as appropriate: allergies, current medications, past family history, past medical history, past social history, past surgical history and problem list.    Review of Systems   Comprehensive ROS negative except above    PHYSICAL EXAM  BP 91/56  - Pulse 88  - Temp 37.2 ??C (98.9 ??F) (Oral)  - Ht 162.6 cm (5' 4)  - Wt 56.2 kg (124 lb)  - SpO2 92%  - BMI 21.28 kg/m??   General appearance: alert, cooperative, NAD   Head: normocephalic and atraumatic  Eyes: normal EOMs, PERRL, normal conjunctiva, no discharge  ENT: OP clear and moist, no tonsillar exudates or erythema, normal TMs and external ear canals, normal nasal passages  Neck: normal ROM, supple, no thyromegaly, no cervical/supraclavicular lymphadenopathy  Lungs: normal work of breathing, good air entry and upper lobes and clear to auscultation bilaterally, decreased bibasilar breath sounds. no wheezes or crackles appreciated   Heart: regular rate and rhythm, S1, S2 normal, no murmur, click, rub or gallop   Abdomen: Abdomen soft, non-tender, non distended. No masses, no organomegaly. Bowel sounds present.  Extremities: extremities normal, atraumatic, no cyanosis or edema  NEURO: AAOx3, appropriately responds to questions, spontaneous movement of extremities  Psych: normal mood and affect    [facility] Components:     Barriers to goals identified and addressed. Pertinent handouts were given today and reviewed with the patient as indicated.  The Care Plan and Self-Management goals have been included on the AVS and the AVS has been printed. Any outside resources or referrals needed at this time are noted above. Patient's current medications have been reviewed. Any new medications  prescribed have been discussed, and side effects have been addressed. Have assessed the patient's understanding, response, and barriers to adherence to medications. Patient voiced understanding and all questions have been answered to satisfaction.

## 2017-09-28 NOTE — Unmapped (Signed)
-----   Message from [name], MD sent at 09/27/2017  6:02 PM EDT -----  Please notify of negative UCx.

## 2017-09-28 NOTE — Unmapped (Signed)
Called and spoke with patient's son and advised him of the urine cx being negative. Son voiced understanding.

## 2017-10-04 NOTE — Unmapped (Signed)
Attempted to notify patient in change of upcoming appointment. Unable to reach the patient's son. Left message to return call. AWV has been canceled for 9/11 but will need to be rescheduled with [facility].

## 2017-10-06 NOTE — Unmapped (Signed)
[facility] Refill Coordination Note    Specialty Medication(s) to be Shipped:   [name] [name]    Other medication(s) to be shipped: [name] [name], DOB: [birth year]  Phone: [phone] (home)   Shipping Address: [address]  [place] [address]    All above HIPAA information was verified with patient's caregiver.     Completed refill call assessment today to schedule patient's medication shipment from the [facility] [phone]).       Specialty medication(s) and dose(s) confirmed: Regimen is correct and unchanged.   Changes to medications: [name] [name] Reports stopping the following medications: amlodipine  Changes to insurance: No  Questions for the pharmacist: No    The patient will receive a drug information handout for each medication shipped and additional FDA Medication Guides as required.      DISEASE/MEDICATION-SPECIFIC INFORMATION        N/A    ADHERENCE              MEDICARE PART B DOCUMENTATION     Not Applicable    SHIPPING     Shipping address confirmed in Epic.     Delivery Scheduled: Yes, Expected medication delivery date: 10/17/17 via UPS or courier. (via [employer] delivery)    [name] [name]   [facility] Specialty Technician

## 2017-10-09 MED ORDER — CRIZOTINIB 250 MG CAPSULE
ORAL_CAPSULE | Freq: Two times a day (BID) | ORAL | 3 refills | 0.00000 days | Status: CP
Start: 2017-10-09 — End: 2018-03-01
  Filled 2017-10-17: qty 60, 30d supply, fill #0

## 2017-10-09 NOTE — Unmapped (Signed)
AOC Triage Note     Patient: [name] [name]     Reason for call: Pleur-X    Time call returned: 1433     Phone Assessment: Patient's husband reported that when they started to drain the pluerX catheter on the left side, the fluid was a cranberry color, they were able to drain the right side normally. They are concerned about this and would like to know what to do.      Triage Recommendations: Will discuss with care team.     Patient Response: Verbalized understanding.     Outstanding tasks: Please Advise     Patient Pharmacy has been verified and primary pharmacy has been marked as preferred

## 2017-10-09 NOTE — Unmapped (Signed)
AOC Triage Note     Patient: [name] [name]     Reason for call: Pleur-X    Time call returned: 1509     Phone Assessment: Called patient's husband back to let him know that this can be normal, and that he should finish the drain that he started this morning.      Triage Recommendations: None     Patient Response: Verbalized understanding.     Outstanding tasks: None     Patient Pharmacy has been verified and primary pharmacy has been marked as preferred

## 2017-10-09 NOTE — Unmapped (Signed)
Hi,     Patient's husband, contacted the Communication Center regarding the following:    - stated that when the pleurx catheter was emptied this morning it was bloody.  He was like to discuss his findings with someone on the nursing team.     Please contact patient's husband at [phone].    Thanks in advance,    Tylene Fantasia  [facility] Communication [facility]   [phone]

## 2017-10-13 DIAGNOSIS — J910 Malignant pleural effusion: Secondary | ICD-10-CM | POA: Diagnosis not present

## 2017-10-16 NOTE — Unmapped (Signed)
Cameron Sprang, son of patient, contacted the Communication Center regarding the following:    Swelling near her cath with pain.    Please contact [name] at [phone].    Thanks in advance,    Drema Balzarine  [facility] Communication Center   [phone]

## 2017-10-17 MED FILL — XALKORI 250 MG CAPSULE: 30 days supply | Qty: 60 | Fill #0 | Status: AC

## 2017-10-18 NOTE — Unmapped (Signed)
AOC Triage Note     Patient: [name] [name]     Reason for call:  missed appointment, need to reschedule    Time call returned: 0845     Phone Assessment: Patient's son called regarding a missed appointment with IP yesterday. He is unsure if he will be able to bring her in tomorrow afternoon to clinic and says Friday would be better.      Triage Recommendations: Will contact care team regarding seeing patient either tomorrow or any other options.      Patient Response:  Verbalized understanding, awaiting call back     Outstanding tasks: Does IP see patient's anywhere outside of multi D? Patient's son would like her to be seen Friday, but if that is not an option will try to bring her to IP clinic tomorrow afternoon.      Patient Pharmacy has been verified and primary pharmacy has been marked as preferred

## 2017-10-18 NOTE — Unmapped (Signed)
[name] [name], son of the patient, contacted the Communication Center regarding the following:    They the missed the appointment that was setup for the patient. [name] [name] says the message was left on his home phone instead of his mobile and he didn't get the message until last night. He called back to ask if they could come in and asked if that's ok to let him know on his mobile phone line.    Please contact [name] [name] at [phone].    Thanks in advance,    Drema Balzarine  [facility] Communication [facility]   [phone]

## 2017-10-24 ENCOUNTER — Ambulatory Visit: Admit: 2017-10-24 | Discharge: 2017-10-24 | Payer: MEDICARE | Attending: Family Medicine | Primary: Family Medicine

## 2017-10-24 ENCOUNTER — Ambulatory Visit: Admit: 2017-10-24 | Discharge: 2017-10-24 | Payer: MEDICARE | Attending: Pulmonary Disease | Primary: Pulmonary Disease

## 2017-10-24 ENCOUNTER — Ambulatory Visit
Admit: 2017-10-24 | Discharge: 2017-10-24 | Payer: MEDICARE | Attending: Geriatric Medicine | Primary: Geriatric Medicine

## 2017-10-24 DIAGNOSIS — Z515 Encounter for palliative care: Secondary | ICD-10-CM

## 2017-10-24 DIAGNOSIS — E11649 Type 2 diabetes mellitus with hypoglycemia without coma: Secondary | ICD-10-CM

## 2017-10-24 DIAGNOSIS — Z794 Long term (current) use of insulin: Secondary | ICD-10-CM

## 2017-10-24 DIAGNOSIS — C3490 Malignant neoplasm of unspecified part of unspecified bronchus or lung: Secondary | ICD-10-CM

## 2017-10-24 DIAGNOSIS — J90 Pleural effusion, not elsewhere classified: Secondary | ICD-10-CM

## 2017-10-24 DIAGNOSIS — R530 Neoplastic (malignant) related fatigue: Principal | ICD-10-CM

## 2017-10-24 NOTE — Unmapped (Signed)
Assessment:     Patient:[name] [name] (02/14/1931)  Reason for visit: [name] is a 84 y.o.female who returns for bilateral maligant effusion s/p bilaterl PleurX     Plan:     1. Left pleurx has become dislodged with cuff outside of the skin and significant leaking.  Removed pleurx in clinic and sutured wound closed.    Plan to replace Left Pleurx on 8/29 at 8 am.  Instructed to hold eliquis starting now.  NPO after midnight Wednesday night.  Son will provide transportation to and from.    Continue to drain the Right pleurx every other day up to a liter.    Subjective:   HPI: [name] [name] returns for pleurx management.  1 week ago, it was noticed that the left tube cuff was emerging from the skin.  Yesterday, after draining of pink fluid, the dressing became saturated quickly.  The right side is without drainage or pain.    The right tube is draining every other day nearly each time, yellow fluid.  The left is draining every other day as well nearly and has become more pink.  No purulence.  No fevers, chills, redness, or pain at either site.      Review of Systems  A 12 point review of systems was negative except for pertinent items noted in the HPI.    Past Medical History:   Diagnosis Date   ??? Atrial fibrillation (CMS-HCC)    ??? Cancer (CMS-HCC)    ??? Diabetes mellitus (CMS-HCC)    ??? Hypertension    ??? Metastatic lung cancer (metastasis from lung to other site) (CMS-HCC)    ??? Pulmonary embolism (CMS-HCC)        Past Surgical History:   Procedure Laterality Date   ??? CHG RAD GUIDED,PERCUT DRAINAGE,W/CATH PLACE Right 06/19/2017    Procedure: RADIOLOGICAL GUIDANCE, FOR PERCUTANEOUS DRAINAGE, WITH PLACEMENT OF CATHETER, RAD S&I;  Surgeon: Sherwood Gambler, MD;  Location: BRON[facility] PROCEDURE LAB [facility];  Service: Pulmonary   ??? CHOLECYSTECTOMY     ??? HYSTERECTOMY     ??? neck surgey     ??? PR INSERTION INDWELLING TUNNELED PLEURAL CATHETER Bilateral 06/19/2017    Procedure: INSERTION OF INDWELLING TUNNELED PLEURAL CATHETER WITH CUFF WITH MODERATE SEDATION;  Surgeon: Sherwood Gambler, MD;  Location: BRON[facility] PROCEDURE LAB [facility];  Service: Pulmonary   ??? PR MOD SED SAME PHYS/QHP EACH ADDL 15 MINS  06/19/2017    Procedure: MODERATE SEDATION SERVICES PROVIDED BY SAME PHYSICIAN OR OTHER QUALIFIED HEALTH CARE PROFESSIONAL PERFORMING THE DIAGNOSTIC OR THERAPEUTIC SERVICE THAT SEDATION SUPPORTS, EACH ADDITIONAL 15 MINS;  Surgeon: Sherwood Gambler, MD;  Location: BRONCH PROCEDURE LAB [facility];  Service: Pulmonary   ??? PR MOD SED SAME PHYS/QHP INITIAL 15 MINS 5/> YRS  06/19/2017    Procedure: MODERATE SEDATION SERVICES PROVIDED BY SAME PHYSICIAN OR OTHER QUALIFIED HC PROFESSIONAL PERFORMING THE DIAGNOSTIC OR THERAPEUTIC SERVICE THAT SEDATION SUPPORTS, INIT 15 MINS, PT AGE 42 YEARS OR OLDER;  Surgeon: Sherwood Gambler, MD;  Location: BRONCH PROCEDURE LAB [facility];  Service: Pulmonary       Current Outpatient Medications   Medication Sig Dispense Refill   ??? blood sugar diagnostic (ACCU-CHEK AVIVA) Strp      ??? calcium carbonate-vitamin D3 (CALTRATE 600 + D) 600 mg (1,500 mg)-800 unit Chew Chew 1 tablet Two (2) times a day.     ??? crizotinib (XALKORI) 250 mg capsule Take 1 capsule (250 mg total) by mouth Two (2) times a  day. 60 capsule 3   ??? furosemide (LASIX) 20 MG tablet Take 20 mg by mouth daily as needed for swelling.     ??? insulin aspart prot/insuln asp (NOVOLOG MIX 70-30 FLEXPEN SUBQ) Inject 9 Units under the skin Two (2) times a day (30 minutes before a meal).     ??? lidocaine 4 % patch Place 1 patch on the skin daily.     ??? losartan (COZAAR) 100 MG tablet Take 1 tablet (100 mg total) by mouth daily. 90 tablet 3   ??? metFORMIN (GLUCOPHAGE-XR) 500 MG 24 hr tablet TAKE 2 TABLETS (1,000 MG TOTAL) BY MOUTH TWO (2) TIMES A DAY. 360 tablet 3   ??? pen needle, diabetic (BD ULTRA-FINE SHORT PEN NEEDLE) 31 gauge x 5/16 Ndle USE AS DIRECTED. USE 4 NEEDLES A DAY WITH DIABETIC MEDICATION PENS AS DIRECTED     ??? potassium chloride SA (K-DUR,KLOR-CON) 20 MEQ tablet Take 1 tablet (20 mEq total) by mouth daily. 90 tablet 1   ??? ranitidine (ZANTAC) 150 MG tablet Take 1 tablet (150 mg total) by mouth Two (2) times a day. 180 tablet 3   ??? simethicone (GAS-X) 80 MG chewable tablet Chew 1 tablet (80 mg total) 4 (four) times a day as needed for flatulence (gas relief). 100 tablet 2     No current facility-administered medications for this visit.        Allergies as of 10/24/2017 - Reviewed 10/24/2017   Allergen Reaction Noted   ??? Sulfasalazine Nausea Only 12/22/2014       Family History   Problem Relation Age of Onset   ??? Cancer Brother         lung   ??? Cancer Daughter         lung   ??? Cancer Daughter         breast       Social History     Socioeconomic History   ??? Marital status: Widowed     Spouse name: Not on file   ??? Number of children: Not on file   ??? Years of education: Not on file   ??? Highest education level: Not on file   Occupational History   ??? Not on file   Social Needs   ??? Financial resource strain: Not on file   ??? Food insecurity:     Worry: Not on file     Inability: Not on file   ??? Transportation needs:     Medical: Not on file     Non-medical: Not on file   Tobacco Use   ??? Smoking status: Never Smoker   ??? Smokeless tobacco: Never Used   Substance and Sexual Activity   ??? Alcohol use: No   ??? Drug use: Never   ??? Sexual activity: Not on file   Lifestyle   ??? Physical activity:     Days per week: Not on file     Minutes per session: Not on file   ??? Stress: Not on file   Relationships   ??? Social connections:     Talks on phone: Not on file     Gets together: Not on file     Attends religious service: Not on file     Active member of club or organization: Not on file     Attends meetings of clubs or organizations: Not on file     Relationship status: Not on file   Other Topics Concern   ??? Do you use  sunscreen? No   ??? Tanning bed use? No   ??? Are you easily burned? No   ??? Excessive sun exposure? No   ??? Blistering sunburns? No   Social History Narrative   ??? Not on file       Objective:     There were no vitals filed for this visit.  General: chronically ill appearing.  Eyes: Anicteric sclera, conjunctiva clear.  ENT:  Nares normal, septum midline, mucosa normal, no drainage or sinus tenderness.  Lymph: No cervical or supraclavicular adenopathy.  Lungs: Normal excursion, no dullness to percussion. Good air movement bilaterally, without wheezes or crackles. Normal upper airway sounds without evidence of stridor. Scant effusion on the right by ultrasound.  Small effusion on the left by ultrasound.  Cardiovascular: Regular rate and rhythm, S1, S2 normal, no murmur, click, rub or gallop appreciated.  Abdomen: bowel sounds are normal, liver is not enlarged, spleen is not enlarged  Musculoskeletal: No clubbing and no synovitis.  Skin: No rashes or lesions., No erythema, purulence.  Sanguinous fluid draining from left tube site.  Right side is clean and dry with cuff palpable 1cm from skin insertion site.  Left cuff is 95% out of the skin.  Neuro: No focal neurological deficits.      Diagnostic Review:     I have personally reviewed all of the diagnostic information associated with this encounter.

## 2017-10-24 NOTE — Unmapped (Signed)
OUTPATIENT ONCOLOGY PALLIATIVE CARE    Principal Diagnosis: [name] [name] is a 84 y.o. female with stage 4 NSCLC,  diagnosed in 2019.  Disease sites include brain, liver, s/p bilateral pleurx.     Assessment/Plan:   1.  Fatigue: Discussed that many factors may be contributing, including hypoglycemia.  Advised patient and her son to communicate with her primary care physician regarding further adjustments in her insulin dose???[name] [name] plans to communicate with her primary physician.  Malignancy is likely a contributing factor.  Her most recent labs from last month were all unremarkable, including mild anemia, electrolytes within normal limits and slightly elevated TSH.  Given that her fatigue does not appear to be significantly limiting her daily activities, will monitor without addition of other pharmacologic treatment.    2.  Lung cancer: Patient appears to be tolerating crizotinib.  She is also taking Eliquis for A. fib.    3.  Advanced care planning: Patient son demonstrates prognostic awareness that her cancer is incurable.  Her scans do not demonstrate significant treatment effect but will clarify with oncology.  She appears frail at this visit and concerned about her risk of both physical and functional decline in the coming weeks/months.    F/u: 1 month, in conjunction with next oncology visit.    ----------------------------------------  Referring Provider: Eloise Harman, MD  Oncology Team: Thoracic  PCP: [name], MD      HPI: 84 year old woman with metastatic non-small cell lung cancer diagnosed 6 months ago (liver, pleura, bone) who was seen for initial visit.  Her son [name] [name] was also present.  Patient lives in [place] with her 79 year old grandson.  She has custody.  She has 24/7 support from family members, including her son [name] [name] who works in IT and is in her home during the day and her daughter-in-law who stays with her at night.  Patient herself does some cleaning and like cooking.  She has stopped doing laundry.  Her son states that she has days where she does not feel up to being active.  On other days she estimates 3-4 times a week she is talkative and active.  She will express interest in going shopping for example.  He describes her overall is being lethargic and sleeping more.  Her past medical history includes diabetes.  Her son states that he has been decreasing her insulin dose and consultation with her primary physician.  He does note that she has had fluctuating fingerstick readings from 50 past week to over 500.    Patient denies pain or discomfort and shortness of breath.  Her son assists with draining her bilateral Pleurx catheters every other day and states that approximately 450 cc all removed.  He states that previously he had only been removing 100 cc.  Her appetite is good at times and she has gained 20 pounds after having lost weight in the context of her cancer diagnosis.    Patient states that she does not let cancer bother her.  She denies having any worries.  She will gain strength and seeing the rest of her family doing well.  She states believing in G-d and attending church sometimes.  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    Patient states that she did not know that she had cancer prior to diagnosis.  Her son states understanding that the goal is to shrink the cancer as it is not curable than treatable.  He states being informed that the cancer has decreased in size  and considerable amount and does note hope that it will may be disappear.    Patient states that her bowel function is improved and she denies constipation.  She also denies nausea and vomiting.    Patient states preference for her son [name] [name] to service her healthcare power of attorney.  She states that he knows her best.  She prefers for other family members not to have information about her condition.  [name] [name] states believes that patient would want to be at home if she were dying and not in a hospice facility.  Believes that she would be willing to receive hospital care if she had to be treated.    Current cancer-directed therapy: crizotinib    Symptom Review:  General: refer to hpi  Pain: denies  Fatigue: refer to hpi  Mobility: Uses wheelchair for long distances.  Sleep: refer to hpi  Appetite:refer to hpi  Nausea: denies  Bowel function: normal  Dyspnea: denies  Secretions: denies  Mood: in good spirits    Palliative Performance Scale: 60% - Ambulation: Reduced / unable to do hobby or some housework, significant disease / Self-Care:Occasional assist as necessary / Intake:Normal or reduced / Level of Conscious: Full or confusion    Coping/Support Issues: feels well supported by her family    Goals of Care: Currently receiving cancer directed treatment.    Social History:   Name of primary support son [name] [name]:   Occupation:  Hobbies:  Current residence / distance from [facility]: [employer]    Advance Care Planning: Expressive preference for her son [name] [name] to serve as her Social research officer, government.  HCPOA:  Natural surrogate decision maker:  Living Will:  ACP note:     Objective     Oncology History    - 04/30/17: Presented to ED with dyspnea for over 2 weeks.  CTA with PEs, b/l pleural effusions, mediastinal soft tissue mass 9/5/7cm, invading LUL nd 8.2cm lesion inleft hepatic lobe  - 05/17/17: b/l thoracentesis with 2L removed each lung.  Cytology with no malignant cells but lymphocyte predominant exudative effusiions  - 05/18/17: Biopsy liver lesion c/w lung adenocarcinoma          Patient Active Problem List   Diagnosis   ??? SOB (shortness of breath)   ??? Hypoxemia   ??? Acute pulmonary embolism (CMS-HCC)   ??? HTN (hypertension)   ??? Diabetes mellitus, type 2 (CMS-HCC)   ??? Metastatic lung cancer (metastasis from lung to other site), unspecified laterality (CMS-HCC)   ??? Addison anemia   ??? Lipoma of back   ??? Weight loss   ??? Lump of breast, right   ??? Pernicious anemia   ??? Weight loss, abnormal   ??? Anemia   ??? Need for shingles vaccine   ??? Pleural effusion   ??? Chest pain on breathing   ??? T12 compression fracture (CMS-HCC)   ??? Dyspepsia   ??? Thyroid nodule   ??? Hematuria       Past Medical History:   Diagnosis Date   ??? Atrial fibrillation (CMS-HCC)    ??? Cancer (CMS-HCC)    ??? Diabetes mellitus (CMS-HCC)    ??? Hypertension    ??? Metastatic lung cancer (metastasis from lung to other site) (CMS-HCC)    ??? Pulmonary embolism (CMS-HCC)        Past Surgical History:   Procedure Laterality Date   ??? CHG RAD GUIDED,PERCUT DRAINAGE,W/CATH PLACE Right 06/19/2017    Procedure: RADIOLOGICAL GUIDANCE, FOR PERCUTANEOUS DRAINAGE, WITH PLACEMENT OF CATHETER, RAD S&I;  Surgeon: Sherwood Gambler, MD;  Location: [facility] PROCEDURE LAB [facility];  Service: Pulmonary   ??? CHOLECYSTECTOMY     ??? HYSTERECTOMY     ??? neck surgey     ??? PR INSERTION INDWELLING TUNNELED PLEURAL CATHETER Bilateral 06/19/2017    Procedure: INSERTION OF INDWELLING TUNNELED PLEURAL CATHETER WITH CUFF WITH MODERATE SEDATION;  Surgeon: Sherwood Gambler, MD;  [facility]: [facility];  Service: Pulmonary   ??? PR MOD SED SAME PHYS/QHP EACH ADDL 15 MINS  06/19/2017    Procedure: MODERATE SEDATION SERVICES PROVIDED BY SAME PHYSICIAN OR OTHER QUALIFIED HEALTH CARE PROFESSIONAL PERFORMING THE DIAGNOSTIC OR THERAPEUTIC SERVICE THAT SEDATION SUPPORTS, EACH ADDITIONAL 15 MINS;  Surgeon: Sherwood Gambler, MD;  Location: [facility] PROCEDURE LAB [facility];  Service: Pulmonary   ??? PR MOD SED SAME PHYS/QHP INITIAL 15 MINS 5/> YRS  06/19/2017    Procedure: MODERATE SEDATION SERVICES PROVIDED BY SAME PHYSICIAN OR OTHER QUALIFIED HC PROFESSIONAL PERFORMING THE DIAGNOSTIC OR THERAPEUTIC SERVICE THAT SEDATION SUPPORTS, INIT 15 MINS, PT AGE 70 YEARS OR OLDER;  Surgeon: Sherwood Gambler, MD;  Location: [facility] PROCEDURE LAB [facility];  Service: Pulmonary       Current Outpatient Medications   Medication Sig Dispense Refill   ??? blood sugar diagnostic (ACCU-CHEK AVIVA) Strp      ??? calcium carbonate-vitamin D3 (CALTRATE 600 + D) 600 mg (1,500 mg)-800 unit Chew Chew 1 tablet Two (2) times a day.     ??? crizotinib (XALKORI) 250 mg capsule Take 1 capsule (250 mg total) by mouth Two (2) times a day. 60 capsule 3   ??? furosemide (LASIX) 20 MG tablet Take 20 mg by mouth daily as needed for swelling.     ??? insulin aspart prot/insuln asp (NOVOLOG MIX 70-30 FLEXPEN SUBQ) Inject 9 Units under the skin Two (2) times a day (30 minutes before a meal).     ??? lidocaine 4 % patch Place 1 patch on the skin daily.     ??? losartan (COZAAR) 100 MG tablet Take 1 tablet (100 mg total) by mouth daily. 90 tablet 3   ??? metFORMIN (GLUCOPHAGE-XR) 500 MG 24 hr tablet TAKE 2 TABLETS (1,000 MG TOTAL) BY MOUTH TWO (2) TIMES A DAY. 360 tablet 3   ??? pen needle, diabetic (BD ULTRA-FINE SHORT PEN NEEDLE) 31 gauge x 5/16 Ndle USE AS DIRECTED. USE 4 NEEDLES A DAY WITH DIABETIC MEDICATION PENS AS DIRECTED     ??? potassium chloride SA (K-DUR,KLOR-CON) 20 MEQ tablet Take 1 tablet (20 mEq total) by mouth daily. 90 tablet 1   ??? ranitidine (ZANTAC) 150 MG tablet Take 1 tablet (150 mg total) by mouth Two (2) times a day. 180 tablet 3   ??? simethicone (GAS-X) 80 MG chewable tablet Chew 1 tablet (80 mg total) 4 (four) times a day as needed for flatulence (gas relief). 100 tablet 2     No current facility-administered medications for this visit.        Allergies:   Allergies   Allergen Reactions   ??? Sulfasalazine Nausea Only     Family History:  Cancer-related family history includes Cancer in her brother, daughter, and daughter.  She indicated that her brother is deceased. She indicated that only one of her two daughters is alive.    REVIEW OF SYSTEMS:  A comprehensive review of 14 systems was negative except for pertinent positives noted in HPI.      PHYSICAL EXAM:   Vital signs for this encounter: VS reviewed  in EPIC.  GEN: frail appearing older woman sitting up  PSYCH: Alert and oriented to person, place and time. Euthymic.  HEENT: Pupils equally round without scleral icterus. No facial asymmetry.  CV: Regular rhythm with normal rate, no murmurs.  LUNGS: No increased work of breathing.  ABD: Soft and non-tender with no distention  SKIN: No rashes, petechiae or jaundice noted  EXT: No edema noted of the lower extremities  NEURO: Normal gait and coordination. attentive    Lab Results   Component Value Date    CREATININE 0.79 09/19/2017     Lab Results   Component Value Date    ALKPHOS 135 (H) 09/19/2017    BILITOT 0.2 09/19/2017    BILIDIR 0.20 07/11/2017    PROT 4.9 (L) 09/19/2017    ALBUMIN 2.0 (L) 09/19/2017    ALT 31 09/19/2017    AST 19 09/19/2017        I personally spent over half of a total 60 minutes in counseling and discussion with the patient as described above.    Doreatha Martin, MD  [facility] Outpatient Oncology Palliative Care

## 2017-10-24 NOTE — Unmapped (Signed)
Assessment:     Patient:[name] [name] (02/14/1931)  Reason for visit: JulieFord is a 84 y.o.female who returns for bilateral maligant effusion s/p bilaterl PleurX     Plan:     1. Left pleurx has become dislodged with cuff outside of the skin and significant leaking.  Removed pleurx in clinic and sutured wound closed.    Plan to replace Left Pleurx on 8/29 at 8 am.  Instructed to hold eliquis starting now.  NPO after midnight Wednesday night.  Son will provide transportation to and from.    Continue to drain the Right pleurx every other day up to a liter.    Subjective:   HPI: [name] [name] returns for pleurx management.  1 week ago, it was noticed that the left tube cuff was emerging from the skin.  Yesterday, after draining of pink fluid, the dressing became saturated quickly.  The right side is without drainage or pain.    The right tube is draining every other day nearly each time, yellow fluid.  The left is draining every other day as well nearly and has become more pink.  No purulence.  No fevers, chills, redness, or pain at either site.      Review of Systems  A 12 point review of systems was negative except for pertinent items noted in the HPI.    Past Medical History:   Diagnosis Date   ??? Atrial fibrillation (CMS-HCC)    ??? Cancer (CMS-HCC)    ??? Diabetes mellitus (CMS-HCC)    ??? Hypertension    ??? Metastatic lung cancer (metastasis from lung to other site) (CMS-HCC)    ??? Pulmonary embolism (CMS-HCC)        Past Surgical History:   Procedure Laterality Date   ??? CHG RAD GUIDED,PERCUT DRAINAGE,W/CATH PLACE Right 06/19/2017    Procedure: RADIOLOGICAL GUIDANCE, FOR PERCUTANEOUS DRAINAGE, WITH PLACEMENT OF CATHETER, RAD S&I;  Surgeon: Sherwood Gambler, MD;  Location: [facility] PROCED[facility] LAB [facility];  Service: Pulmonary   ??? CHOLECYSTECTOMY     ??? HYSTERECTOMY     ??? neck surgey     ??? PR INSERTION INDWELLING TUNNELED PLEURAL CATHETER Bilateral 06/19/2017    Procedure: INSERTION OF INDWELLING TUNNELED PLEURAL CATHETER WITH CUFF WITH MODERATE SEDATION;  Surgeon: Sherwood Gambler, MD;  Location: [facility] PROCEDURE LAB [facility];  Service: Pulmonary   ??? PR MOD SED SAME PHYS/QHP EACH ADDL 15 MINS  06/19/2017    Procedure: MODERATE SEDATION SERVICES PROVIDED BY SAME PHYSICIAN OR OTHER QUALIFIED HEALTH CARE PROFESSIONAL PERFORMING THE DIAGNOSTIC OR THERAPEUTIC SERVICE THAT SEDATION SUPPORTS, EACH ADDITIONAL 15 MINS;  Surgeon: Sherwood Gambler, MD;  Location: [facility] PROCEDURE LAB [facility];  Service: Pulmonary   ??? PR MOD SED SAME PHYS/QHP INITIAL 15 MINS 5/> YRS  06/19/2017    Procedure: MODERATE SEDATION SERVICES PROVIDED BY SAME PHYSICIAN OR OTHER QUALIFIED HC PROFESSIONAL PERFORMING THE DIAGNOSTIC OR THERAPEUTIC SERVICE THAT SEDATION SUPPORTS, INIT 15 MINS, PT AGE 42 YEARS OR OLDER;  Surgeon: Sherwood Gambler, MD;  Location: [facility] PROCEDURE LAB [facility];  Service: Pulmonary       Current Outpatient Medications   Medication Sig Dispense Refill   ??? blood sugar diagnostic (ACCU-CHEK AVIVA) Strp      ??? calcium carbonate-vitamin D3 (CALTRATE 600 + D) 600 mg (1,500 mg)-800 unit Chew Chew 1 tablet Two (2) times a day.     ??? crizotinib (XALKORI) 250 mg capsule Take 1 capsule (250 mg total) by mouth Two (2) times a  day. 60 capsule 3   ??? furosemide (LASIX) 20 MG tablet Take 20 mg by mouth daily as needed for swelling.     ??? insulin aspart prot/insuln asp (NOVOLOG MIX 70-30 FLEXPEN SUBQ) Inject 9 Units under the skin Two (2) times a day (30 minutes before a meal).     ??? lidocaine 4 % patch Place 1 patch on the skin daily.     ??? losartan (COZAAR) 100 MG tablet Take 1 tablet (100 mg total) by mouth daily. 90 tablet 3   ??? metFORMIN (GLUCOPHAGE-XR) 500 MG 24 hr tablet TAKE 2 TABLETS (1,000 MG TOTAL) BY MOUTH TWO (2) TIMES A DAY. 360 tablet 3   ??? pen needle, diabetic (BD ULTRA-FINE SHORT PEN NEEDLE) 31 gauge x 5/16 Ndle USE AS DIRECTED. USE 4 NEEDLES A DAY WITH DIABETIC MEDICATION PENS AS DIRECTED     ??? potassium chloride SA (K-DUR,KLOR-CON) 20 MEQ tablet Take 1 tablet (20 mEq total) by mouth daily. 90 tablet 1   ??? ranitidine (ZANTAC) 150 MG tablet Take 1 tablet (150 mg total) by mouth Two (2) times a day. 180 tablet 3   ??? simethicone (GAS-X) 80 MG chewable tablet Chew 1 tablet (80 mg total) 4 (four) times a day as needed for flatulence (gas relief). 100 tablet 2     No current facility-administered medications for this visit.        Allergies as of 10/24/2017 - Reviewed 10/24/2017   Allergen Reaction Noted   ??? Sulfasalazine Nausea Only 12/22/2014       Family History   Problem Relation Age of Onset   ??? Cancer Brother         lung   ??? Cancer Daughter         lung   ??? Cancer Daughter         breast       Social History     Socioeconomic History   ??? Marital status: Widowed     Spouse name: Not on file   ??? Number of children: Not on file   ??? Years of education: Not on file   ??? Highest education level: Not on file   Occupational History   ??? Not on file   Social Needs   ??? Financial resource strain: Not on file   ??? Food insecurity:     Worry: Not on file     Inability: Not on file   ??? Transportation needs:     Medical: Not on file     Non-medical: Not on file   Tobacco Use   ??? Smoking status: Never Smoker   ??? Smokeless tobacco: Never Used   Substance and Sexual Activity   ??? Alcohol use: No   ??? Drug use: Never   ??? Sexual activity: Not on file   Lifestyle   ??? Physical activity:     Days per week: Not on file     Minutes per session: Not on file   ??? Stress: Not on file   Relationships   ??? Social connections:     Talks on phone: Not on file     Gets together: Not on file     Attends religious service: Not on file     Active member of club or organization: Not on file     Attends meetings of clubs or organizations: Not on file     Relationship status: Not on file   Other Topics Concern   ??? Do you use  sunscreen? No   ??? Tanning bed use? No   ??? Are you easily burned? No   ??? Excessive sun exposure? No   ??? Blistering sunburns? No   Social History Narrative   ??? Not on file       Objective:     There were no vitals filed for this visit.  General: chronically ill appearing.  Eyes: Anicteric sclera, conjunctiva clear.  ENT:  Nares normal, septum midline, mucosa normal, no drainage or sinus tenderness.  Lymph: No cervical or supraclavicular adenopathy.  Lungs: Normal excursion, no dullness to percussion. Good air movement bilaterally, without wheezes or crackles. Normal upper airway sounds without evidence of stridor. Scant effusion on the right by ultrasound.  Small effusion on the left by ultrasound.  Cardiovascular: Regular rate and rhythm, S1, S2 normal, no murmur, click, rub or gallop appreciated.  Abdomen: bowel sounds are normal, liver is not enlarged, spleen is not enlarged  Musculoskeletal: No clubbing and no synovitis.  Skin: No rashes or lesions., No erythema, purulence.  Sanguinous fluid draining from left tube site.  Right side is clean and dry with cuff palpable 1cm from skin insertion site.  Left cuff is 95% out of the skin.  Neuro: No focal neurological deficits.      Diagnostic Review:     I have personally reviewed all of the diagnostic information associated with this encounter.

## 2017-10-24 NOTE — Unmapped (Signed)
Your pleurx procedure is scheduled for 8/29 (Thursday) at 8am. Please plan to arrive 2 hours before your procedure is scheduled (6am) and go to registration in [facility]. Registration is located on the ground floor of [facility], immediately to the left of the information desk as you enter the building.     Please do not eat or drink ANYTHING after midnight the night before your procedure except for sips of water to take your morning medications. You need to have someone with you to drive you to and from the procedure.    Thank you for allowing me to participate in your care. Please do not hesitate to contact me with issues or questions.     Mercy Moore, MD  Evalyn Casco, MD  Tiana Loft MD    Fellow: Catalina Gravel DO    During business hours please contact me via Tammy Allred at [phone] for medical questions, e-mail her at [email or web address] or page her at [phone].  Call Darrel Reach for scheduling and other questions at [phone].  The nursing triage line is [phone].    Sign up for MyChart to see your results, appointments, and communicate directly with your doctor at [email or web address]    For urgent issues after hours please call [phone] [facility] Med Operator) and ask to speak with the Adult Pulmonary Fellow on call.

## 2017-10-24 NOTE — Unmapped (Deleted)
Nutrition Consult Note    Referring Provider:  [name], MD    Reason for Referral:  No chief complaint on file.      Medical History:  Patient Active Problem List   Diagnosis   ??? SOB (shortness of breath)   ??? Hypoxemia   ??? Acute pulmonary embolism (CMS-HCC)   ??? HTN (hypertension)   ??? Diabetes mellitus, type 2 (CMS-HCC)   ??? Metastatic lung cancer (metastasis from lung to other site), unspecified laterality (CMS-HCC)   ??? Addison anemia   ??? Lipoma of back   ??? Weight loss   ??? Lump of breast, right   ??? Pernicious anemia   ??? Weight loss, abnormal   ??? Anemia   ??? Need for shingles vaccine   ??? Pleural effusion   ??? Chest pain on breathing   ??? T12 compression fracture (CMS-HCC)   ??? Dyspepsia   ??? Thyroid nodule   ??? Hematuria       Barriers to Care:  {BARRIERS TO ZOXW:96045}    Present height    Present weight    Current BMI      Weight Status Categories:  Underweight:  BMI < 18.5  Normal Weight:  BMI 18.5 - 24.9  Overweight:  BMI 25 - 29.9  Obesity Class I:  BMI 30 - 34.9  Obesity Class II:  BMI 35 - 39.9  Obesity Class III:  BMI ? 40    Waist Circumference:   10/23/2017 *** in    Weight History:  Wt Readings from Last 6 Encounters:   09/26/17 56.2 kg (124 lb)   09/19/17 57.5 kg (126 lb 12.8 oz)   09/12/17 56.4 kg (124 lb 6.4 oz)   09/05/17 51.7 kg (114 lb)   09/02/17 51.7 kg (114 lb)   08/22/17 51.7 kg (114 lb)       Allergies:   Allergies   Allergen Reactions   ??? Sulfasalazine Nausea Only       Relevant Medications, Herbs, Supplements:    Current Outpatient Medications:   ???  blood sugar diagnostic (ACCU-CHEK AVIVA) Strp, , Disp: , Rfl:   ???  calcium carbonate-vitamin D3 (CALTRATE 600 + D) 600 mg (1,500 mg)-800 unit Chew, Chew 1 tablet Two (2) times a day., Disp: , Rfl:   ???  crizotinib (XALKORI) 250 mg capsule, Take 1 capsule (250 mg total) by mouth Two (2) times a day., Disp: 60 capsule, Rfl: 3  ???  furosemide (LASIX) 20 MG tablet, Take 20 mg by mouth daily as needed for swelling., Disp: , Rfl:   ???  insulin aspart prot/insuln asp (NOVOLOG MIX 70-30 FLEXPEN SUBQ), Inject 9 Units under the skin Two (2) times a day (30 minutes before a meal)., Disp: , Rfl:   ???  lidocaine 4 % patch, Place 1 patch on the skin daily., Disp: , Rfl:   ???  losartan (COZAAR) 100 MG tablet, Take 1 tablet (100 mg total) by mouth daily., Disp: 90 tablet, Rfl: 3  ???  metFORMIN (GLUCOPHAGE-XR) 500 MG 24 hr tablet, TAKE 2 TABLETS (1,000 MG TOTAL) BY MOUTH TWO (2) TIMES A DAY., Disp: 360 tablet, Rfl: 3  ???  pen needle, diabetic (BD ULTRA-FINE SHORT PEN NEEDLE) 31 gauge x 5/16 Ndle, USE AS DIRECTED. USE 4 NEEDLES A DAY WITH DIABETIC MEDICATION PENS AS DIRECTED, Disp: , Rfl:   ???  potassium chloride SA (K-DUR,KLOR-CON) 20 MEQ tablet, Take 1 tablet (20 mEq total) by mouth daily., Disp: 90 tablet, Rfl: 1  ???  ranitidine (ZANTAC) 150 MG tablet, Take 1 tablet (150 mg total) by mouth Two (2) times a day., Disp: 180 tablet, Rfl: 3  ???  simethicone (GAS-X) 80 MG chewable tablet, Chew 1 tablet (80 mg total) 4 (four) times a day as needed for flatulence (gas relief)., Disp: 100 tablet, Rfl: 2    Do you have any concerns about taking or affording your medications?  ***    Relevant Labs:  Hemoglobin A1C (%)   Date Value   09/26/2017 8.1 (A)   08/22/2017 9.6 (A)   08/01/2017 10.3 (A)      No components found for: LDLCALC   BP Readings from Last 3 Encounters:   09/26/17 91/56   09/19/17 91/53   09/12/17 95/53     No results found for: CHOL  No results found for: HDL  No components found for: LDLCALC,  LDL,  LDLDIRECT  No results found for: TRIG  No components found for: CHOLHDL  No results found for: VITDTOTAL  No results found for: VITAMINB12    Blood Glucose Readings   Fasting Lunch Pre-Supper Bedtime   Range (mg/dL) ***      Average (mg/dL)       Meter/Log book present: {yes:28281}  Hypoglycemia: {Yes/No:22953}    Physical Activity:  Type: {misc; exercise types:16438}   Frequency: *** days per week  Duration: *** minutes per day    24-Hour recall/usual intake:    Time Intake Breakfast ***   Snack (AM) ***   Lunch ***   Snack (PM) ***   Dinner ***   Snack (HS) ***     Other Nutrition Information:  ***    Estimated Daily Needs:  {Energy Needs:57594}  *** g protein  {Nutrient Needs:58116:p}    Nutrition Diagnosis:  {Nutrition diagnosis:304200134}    Nutrition Intervention:  {Nutrition Interventions:3047301}    Materials Provided:  {Materials provided UUVO:536644034}    Patient Stated Nutrition Goals:  ***  Goals Added to Visit Navigator?  {goals yes/no:57886}    Monitoring/Evaluation:  Progress towards goals:  {Progress toward weight goals:57555}    Expected Compliance is:   {Expected compliance is:304200138}    Follow-up:  ***    Length of visit was *** minutes  *** unit(s) billed per insurance    Rita Ohara MS RD CDE

## 2017-10-24 NOTE — Unmapped (Signed)
PRE-OPERATIVE DIAGNOSIS: Dislodged left pleurx catheter    POST-OPERATIVE DIAGNOSIS: Same     CONSENTt: After the risks benefits and alternatives of the procedure were thoroughly explained, informed consent was obtained. Immediately prior to the procedure, the time out was executed including correct patient identification and agreement on the procedure to be performed.    PROCEDURE: The sutures were removed and the skin prepped in the usual fashion. 10 mL's of 1% lidocaine were infiltrated subcutaneously around the Pleurx cuff and the skin exit site. Firm pressure was applied and the catheter removed. The exit site was sutured x 3 with 3-0 silk suture and dressed with a sterile dressing.     FINDINGS: Successful Pleurx catheter removal.    ESTIMATED BLOOD LOSS: None    SPECIMENS REMOVED: None    COMPLICATIONS: None    CONDITION: Stable    ATTENDING ATTESTATION:  I personally performed the entirety of the procedure.

## 2017-10-25 DIAGNOSIS — T8189XA Other complications of procedures, not elsewhere classified, initial encounter: Principal | ICD-10-CM

## 2017-10-26 ENCOUNTER — Ambulatory Visit: Admit: 2017-10-26 | Discharge: 2017-10-26 | Payer: MEDICARE

## 2017-10-26 ENCOUNTER — Encounter
Admit: 2017-10-26 | Discharge: 2017-10-26 | Payer: MEDICARE | Attending: Student in an Organized Health Care Education/Training Program | Primary: Student in an Organized Health Care Education/Training Program

## 2017-10-26 DIAGNOSIS — T8189XA Other complications of procedures, not elsewhere classified, initial encounter: Principal | ICD-10-CM

## 2017-10-26 DIAGNOSIS — I10 Essential (primary) hypertension: Secondary | ICD-10-CM | POA: Diagnosis not present

## 2017-10-26 DIAGNOSIS — I4891 Unspecified atrial fibrillation: Secondary | ICD-10-CM | POA: Diagnosis not present

## 2017-10-26 DIAGNOSIS — E119 Type 2 diabetes mellitus without complications: Secondary | ICD-10-CM | POA: Diagnosis not present

## 2017-10-26 DIAGNOSIS — Z452 Encounter for adjustment and management of vascular access device: Secondary | ICD-10-CM | POA: Diagnosis not present

## 2017-10-26 DIAGNOSIS — Z86711 Personal history of pulmonary embolism: Secondary | ICD-10-CM | POA: Diagnosis not present

## 2017-10-26 DIAGNOSIS — J95811 Postprocedural pneumothorax: Secondary | ICD-10-CM | POA: Diagnosis not present

## 2017-10-26 DIAGNOSIS — C7989 Secondary malignant neoplasm of other specified sites: Secondary | ICD-10-CM | POA: Diagnosis not present

## 2017-10-26 DIAGNOSIS — J948 Other specified pleural conditions: Secondary | ICD-10-CM | POA: Diagnosis not present

## 2017-10-26 DIAGNOSIS — J910 Malignant pleural effusion: Secondary | ICD-10-CM | POA: Diagnosis not present

## 2017-10-26 DIAGNOSIS — R918 Other nonspecific abnormal finding of lung field: Secondary | ICD-10-CM | POA: Diagnosis not present

## 2017-10-26 DIAGNOSIS — Z79899 Other long term (current) drug therapy: Secondary | ICD-10-CM | POA: Diagnosis not present

## 2017-10-26 DIAGNOSIS — J90 Pleural effusion, not elsewhere classified: Secondary | ICD-10-CM | POA: Diagnosis not present

## 2017-10-26 DIAGNOSIS — Z794 Long term (current) use of insulin: Secondary | ICD-10-CM | POA: Diagnosis not present

## 2017-10-26 DIAGNOSIS — C3490 Malignant neoplasm of unspecified part of unspecified bronchus or lung: Secondary | ICD-10-CM | POA: Diagnosis not present

## 2017-10-26 DIAGNOSIS — Z4682 Encounter for fitting and adjustment of non-vascular catheter: Secondary | ICD-10-CM | POA: Diagnosis not present

## 2017-10-26 DIAGNOSIS — Z9049 Acquired absence of other specified parts of digestive tract: Secondary | ICD-10-CM | POA: Diagnosis not present

## 2017-10-26 NOTE — Unmapped (Signed)
Brief Operative Note  (CSN: [id number])      Date of Surgery: 10/26/2017    Pre-op Diagnosis: pleural effusion    Post-op Diagnosis: Pleural effusion [J90]    Procedure(s):  INSERTION OF INDWELLING TUNNELED PLEURAL CATHETER WITH CUFF: 32550 (CPT??)  Note: Revisions to procedures should be made in chart - see Procedures activity.    Performing Service: Pulmonary  Surgeon(s) and Role:     * Mercy Moore, MD - Primary     * Snyder Colavito Magda Bernheim, DO - Fellow - Interventional    Findings: Left indwelling pleural catheter placed and 600 mL serosanguinous pleural fluid drained.     Anesthesia: General    Estimated Blood Loss: 5 mL    Complications: None    Specimens: None collected    Implants: * No implants in log *    Surgeon Notes: I was present for the entire procedure    Tommy Rainwater   Date: 10/26/2017  Time: 8:55 AM

## 2017-10-26 NOTE — Unmapped (Signed)
Please see separate procedure note under the Procedures tab in Epic.

## 2017-10-31 ENCOUNTER — Ambulatory Visit: Admit: 2017-10-31 | Discharge: 2017-11-01 | Payer: MEDICARE

## 2017-10-31 DIAGNOSIS — C3490 Malignant neoplasm of unspecified part of unspecified bronchus or lung: Principal | ICD-10-CM

## 2017-10-31 DIAGNOSIS — Z09 Encounter for follow-up examination after completed treatment for conditions other than malignant neoplasm: Secondary | ICD-10-CM

## 2017-10-31 DIAGNOSIS — J90 Pleural effusion, not elsewhere classified: Secondary | ICD-10-CM

## 2017-10-31 MED ORDER — AMOXICILLIN 875 MG-POTASSIUM CLAVULANATE 125 MG TABLET
ORAL_TABLET | Freq: Two times a day (BID) | ORAL | 0 refills | 0 days | Status: CP
Start: 2017-10-31 — End: 2017-11-28

## 2017-10-31 MED ORDER — CEFADROXIL 500 MG CAPSULE
ORAL_CAPSULE | Freq: Two times a day (BID) | ORAL | 0 refills | 0 days | Status: CP
Start: 2017-10-31 — End: 2017-10-31

## 2017-10-31 NOTE — Unmapped (Addendum)
1.  We think you have a skin infection at the site of the left chest catheter  2.  We will treat you with an antiobiotic for 7 days called Augmetin    Thank you for allowing [place] to participate in your care. Please do not hesitate to contact us with issues or questions.     Evalyn Casco, M.D.  Clinical Assistant Professor  Interventional Pulmonology     [facility] Interventional Pulmonology  Tiana Loft, M.D.  Evalyn Casco, M.D.  Roby Lofts, M.D.    Fellow: Catalina Gravel D.O.    During business hours please contact Tammy Allred at [phone] or e-mail her at [email or web address] for medical questions or Darrel Reach at [phone] for procedure scheduling questions or schedule changes.  The nursing triage line is [phone].    Sign up for MyChart to see your results, appointments and communicate directly with your doctors at [email or web address]    For urgent issues after hours please call [phone] [facility] Operator) and ask to speak with the Interventional Pulmonary Fellow on call.

## 2017-10-31 NOTE — Unmapped (Signed)
Medication discontinued 09/26/17 by pcp.

## 2017-11-01 ENCOUNTER — Emergency Department
Admit: 2017-11-01 | Discharge: 2017-11-01 | Disposition: A | Discharge status: Home / Self Care | Payer: MEDICARE | Attending: Student in an Organized Health Care Education/Training Program

## 2017-11-01 ENCOUNTER — Ambulatory Visit
Admit: 2017-11-01 | Discharge: 2017-11-01 | Disposition: A | Discharge status: Home / Self Care | Payer: MEDICARE | Attending: Student in an Organized Health Care Education/Training Program

## 2017-11-01 DIAGNOSIS — R079 Chest pain, unspecified: Principal | ICD-10-CM

## 2017-11-01 DIAGNOSIS — R0789 Other chest pain: Secondary | ICD-10-CM | POA: Diagnosis not present

## 2017-11-01 DIAGNOSIS — D649 Anemia, unspecified: Secondary | ICD-10-CM | POA: Diagnosis not present

## 2017-11-01 DIAGNOSIS — I4891 Unspecified atrial fibrillation: Secondary | ICD-10-CM | POA: Diagnosis not present

## 2017-11-01 DIAGNOSIS — I4510 Unspecified right bundle-branch block: Secondary | ICD-10-CM | POA: Diagnosis not present

## 2017-11-01 DIAGNOSIS — J948 Other specified pleural conditions: Secondary | ICD-10-CM | POA: Diagnosis not present

## 2017-11-01 DIAGNOSIS — E119 Type 2 diabetes mellitus without complications: Secondary | ICD-10-CM | POA: Diagnosis not present

## 2017-11-01 DIAGNOSIS — I10 Essential (primary) hypertension: Secondary | ICD-10-CM | POA: Diagnosis not present

## 2017-11-01 DIAGNOSIS — K219 Gastro-esophageal reflux disease without esophagitis: Secondary | ICD-10-CM | POA: Diagnosis not present

## 2017-11-01 DIAGNOSIS — R0602 Shortness of breath: Secondary | ICD-10-CM | POA: Diagnosis not present

## 2017-11-01 DIAGNOSIS — J9811 Atelectasis: Secondary | ICD-10-CM | POA: Diagnosis not present

## 2017-11-01 LAB — CBC W/ AUTO DIFF
BASOPHILS ABSOLUTE COUNT: 0 10*9/L (ref 0.0–0.1)
BASOPHILS RELATIVE PERCENT: 0.3 %
EOSINOPHILS RELATIVE PERCENT: 0.6 %
HEMATOCRIT: 24.2 % — ABNORMAL LOW (ref 36.0–46.0)
HEMOGLOBIN: 8.8 g/dL — ABNORMAL LOW (ref 13.5–16.0)
LARGE UNSTAINED CELLS: 2 % (ref 0–4)
LYMPHOCYTES ABSOLUTE COUNT: 0.7 10*9/L — ABNORMAL LOW (ref 1.5–5.0)
LYMPHOCYTES RELATIVE PERCENT: 7.5 %
MEAN CORPUSCULAR HEMOGLOBIN CONC: 36.3 g/dL (ref 31.0–37.0)
MEAN CORPUSCULAR HEMOGLOBIN: 23.3 pg — ABNORMAL LOW (ref 26.0–34.0)
MEAN CORPUSCULAR VOLUME: 64.2 fL — ABNORMAL LOW (ref 80.0–100.0)
MEAN PLATELET VOLUME: 7.1 fL (ref 7.0–10.0)
NEUTROPHILS ABSOLUTE COUNT: 7 10*9/L (ref 2.0–7.5)
NEUTROPHILS RELATIVE PERCENT: 80.9 %
PLATELET COUNT: 414 10*9/L (ref 150–440)
RED BLOOD CELL COUNT: 3.77 10*12/L — ABNORMAL LOW (ref 4.00–5.20)
RED CELL DISTRIBUTION WIDTH: 19.5 % — ABNORMAL HIGH (ref 12.0–15.0)
WBC ADJUSTED: 8.6 10*9/L (ref 4.5–11.0)

## 2017-11-01 LAB — COMPREHENSIVE METABOLIC PANEL
ALBUMIN: 2.2 g/dL — ABNORMAL LOW (ref 3.5–5.0)
ALKALINE PHOSPHATASE: 153 U/L — ABNORMAL HIGH (ref 38–126)
ALT (SGPT): 38 U/L (ref 15–48)
AST (SGOT): 17 U/L (ref 14–38)
BILIRUBIN TOTAL: 0.2 mg/dL (ref 0.0–1.2)
BLOOD UREA NITROGEN: 22 mg/dL — ABNORMAL HIGH (ref 7–21)
BUN / CREAT RATIO: 24
CALCIUM: 8.5 mg/dL (ref 8.5–10.2)
CHLORIDE: 100 mmol/L (ref 98–107)
CO2: 28 mmol/L (ref 22.0–30.0)
CREATININE: 0.91 mg/dL (ref 0.60–1.00)
EGFR CKD-EPI AA FEMALE: 67 mL/min/{1.73_m2} (ref >=60)
EGFR CKD-EPI NON-AA FEMALE: 58 mL/min/{1.73_m2} — ABNORMAL LOW (ref >=60)
GLUCOSE RANDOM: 330 mg/dL — ABNORMAL HIGH (ref 65–179)
POTASSIUM: 3.9 mmol/L (ref 3.5–5.0)
PROTEIN TOTAL: 5.4 g/dL — ABNORMAL LOW (ref 6.5–8.3)
SODIUM: 135 mmol/L (ref 135–145)

## 2017-11-01 LAB — ANION GAP: Anion gap 3:SCnc:Pt:Ser/Plas:Qn:: 7 — ABNORMAL LOW

## 2017-11-01 LAB — TROPONIN I: Troponin I.cardiac:MCnc:Pt:Ser/Plas:Qn:: <0.06

## 2017-11-01 LAB — LIPASE: Triacylglycerol lipase:CCnc:Pt:Ser/Plas:Qn:: 25 — ABNORMAL LOW

## 2017-11-01 LAB — HEMATOCRIT: Lab: 24.2 — ABNORMAL LOW

## 2017-11-01 LAB — POIKILOCYTES

## 2017-11-01 LAB — SLIDE REVIEW

## 2017-11-01 NOTE — Unmapped (Signed)
Pt placed on cardiac monitor 

## 2017-11-01 NOTE — Unmapped (Addendum)
Emergency Department Provider Note        ED Clinical Impression     Final diagnoses:   Gastric reflux (Primary)   Chest pain, unspecified type       ED Assessment/Plan     Patient with history of lung cancer and recurrent visits in the past with similar pain related to reflux.  She chest catheters and was started on Augmentin for possible infection at 1 of the sites which may be contributing to what sounds highly suggestive to be reflux again.  Her symptoms have resolved with a GI cocktail and were worse with swallowing and improved by ranitidine prior to arrival.  Given history of the recurrence of this pain in the past and overall clinical picture does not seem consistent with ACS although she certainly has risk factors for such.  I stressed to her that she follow-up closely with her primary care provider, take antibiotics with food, and take probiotics.  Stressed she follow-up with oncology as well.  None of her symptoms seem to be related to the chest tube she has in place.  Her chest x-ray appears unchanged from her recent discharge.  Her anemia is slightly worse but likely related to her cancer and chronic disease.  I stressed to her that she follow this up with her doctor but does not seem related to her chest discomfort that is resolved at this time.    History     Chief Complaint   Patient presents with   ??? Chest Pain     84 y.o. female presents with complaint of chest pain.  This started tonight while sleeping.  It is described as sharp, pressure pain in her chest, like she couldn't burp.  It was worse with swallowing.  Severity is moderate.  It is associated with felt like she couldn't breathe.  It is not associated with vomiting.  Patient has had similar symptoms in past.  She has had recurrent issues related to reflux.  No hx of MI's she reports.  Takes ranitidine and took PTA.  Still has some discomfort present but improved now.  Is taking eliquis as rx.  She started augmentin for her chest tube site today.        History provided by:  Patient  Language interpreter used: No    Chest Pain   Associated symptoms: shortness of breath    Associated symptoms: no fever and no vomiting        Past Medical History:   Diagnosis Date   ??? Atrial fibrillation (CMS-HCC)    ??? Cancer (CMS-HCC)    ??? Diabetes mellitus (CMS-HCC)    ??? Hypertension    ??? Metastatic lung cancer (metastasis from lung to other site) (CMS-HCC)    ??? Pulmonary embolism (CMS-HCC)        Past Surgical History:   Procedure Laterality Date   ??? CHG RAD GUIDED,PERCUT DRAINAGE,W/CATH PLACE Right 06/19/2017    Procedure: RADIOLOGICAL GUIDANCE, FOR PERCUTANEOUS DRAINAGE, WITH PLACEMENT OF CATHETER, RAD S&I;  Surgeon: Sherwood Gambler, MD;  Location: BRONCH PROCEDURE LAB [facility];  Service: Pulmonary   ??? CHOLECYSTECTOMY     ??? HYSTERECTOMY     ??? neck surgey     ??? PR INSERTION INDWELLING TUNNELED PLEURAL CATHETER Bilateral 06/19/2017    Procedure: INSERTION OF INDWELLING TUNNELED PLEURAL CATHETER WITH CUFF WITH MODERATE SEDATION;  Surgeon: Sherwood Gambler, MD;  Location: BRONCH PROCEDURE LAB [facility];  Service: Pulmonary   ??? PR INSERTION INDWELLING TUNNELED PLEURAL CATHETER Left  10/26/2017    Procedure: INSERTION OF INDWELLING TUNNELED PLEURAL CATHETER WITH CUFF;  Surgeon: Mercy Moore, MD;  Location: MAIN OR [facility];  Service: Pulmonary   ??? PR MOD SED SAME PHYS/QHP EACH ADDL 15 MINS  06/19/2017    Procedure: MODERATE SEDATION SERVICES PROVIDED BY SAME PHYSICIAN OR OTHER QUALIFIED HEALTH CARE PROFESSIONAL PERFORMING THE DIAGNOSTIC OR THERAPEUTIC SERVICE THAT SEDATION SUPPORTS, EACH ADDITIONAL 15 MINS;  Surgeon: Sherwood Gambler, MD;  Location: BRONCH PROCEDURE LAB [facility];  Service: Pulmonary   ??? PR MOD SED SAME PHYS/QHP INITIAL 15 MINS 5/> YRS  06/19/2017    Procedure: MODERATE SEDATION SERVICES PROVIDED BY SAME PHYSICIAN OR OTHER QUALIFIED HC PROFESSIONAL PERFORMING THE DIAGNOSTIC OR THERAPEUTIC SERVICE THAT SEDATION SUPPORTS, INIT 15 MINS, PT AGE 51 YEARS OR OLDER; Surgeon: Sherwood Gambler, MD;  Location: BRONCH PROCEDURE LAB [facility];  Service: Pulmonary       Family History   Problem Relation Age of Onset   ??? Cancer Brother         lung   ??? Cancer Daughter         lung   ??? Cancer Daughter         breast       Social History     Socioeconomic History   ??? Marital status: Widowed     Spouse name: None   ??? Number of children: None   ??? Years of education: None   ??? Highest education level: None   Occupational History   ??? None   Social Needs   ??? Financial resource strain: None   ??? Food insecurity:     Worry: None     Inability: None   ??? Transportation needs:     Medical: None     Non-medical: None   Tobacco Use   ??? Smoking status: Never Smoker   ??? Smokeless tobacco: Never Used   Substance and Sexual Activity   ??? Alcohol use: No   ??? Drug use: Never   ??? Sexual activity: None   Lifestyle   ??? Physical activity:     Days per week: None     Minutes per session: None   ??? Stress: None   Relationships   ??? Social connections:     Talks on phone: None     Gets together: None     Attends religious service: None     Active member of club or organization: None     Attends meetings of clubs or organizations: None     Relationship status: None   Other Topics Concern   ??? Do you use sunscreen? No   ??? Tanning bed use? No   ??? Are you easily burned? No   ??? Excessive sun exposure? No   ??? Blistering sunburns? No   Social History Narrative   ??? None       Review of Systems   Constitutional: Negative for fever.   Respiratory: Positive for shortness of breath.    Cardiovascular: Positive for chest pain.   Gastrointestinal: Negative for vomiting.   Genitourinary: Negative for dysuria.   All other systems reviewed and are negative.      Physical Exam     BP 107/69  - Pulse 116  - Temp 36.8 ??C (98.2 ??F) (Oral)  - Resp 20  - Ht 162.6 cm (5' 4)  - Wt 60.8 kg (134 lb 0.6 oz)  - SpO2 100%  - BMI 23.01 kg/m??     Physical Exam  Constitutional: She is oriented to person, place, and time. She appears well-developed and well-nourished. No distress.   elderly   HENT:   Head: Normocephalic and atraumatic.   Eyes: Pupils are equal, round, and reactive to light. Conjunctivae and EOM are normal.   Neck: Normal range of motion. Neck supple.   Cardiovascular: Normal rate and normal pulses.   Irregularly irregular rhythm   Pulmonary/Chest: Effort normal and breath sounds normal. No respiratory distress. She has no wheezes. She has no rales.   Catheters in place B/L chest, no obvious TTP, or fluctuance.  Dressed.   Abdominal: Soft. There is no tenderness. There is no rebound and no guarding.   Musculoskeletal: Normal range of motion.   Trace pitting edema bilaterally   Neurological: She is alert and oriented to person, place, and time. No cranial nerve deficit. Coordination normal.   Skin: Skin is warm and dry.   Psychiatric: She has a normal mood and affect. Her behavior is normal.   Nursing note and vitals reviewed.      ED Course     EKG: Atrial fibrillation, heart rate 101.  Left axis deviation, right bundle block.    Discussed with radiology regarding no change on chest x-ray.  Medical records reviewed.      Coding     Dyane Dustman, MD  11/01/17 1610       Dyane Dustman, MD  11/01/17 0354       Dyane Dustman, MD  11/01/17 0424       Dyane Dustman, MD  11/01/17 [phone]

## 2017-11-01 NOTE — Unmapped (Signed)
Assisted Pt to bathroom and back via WC

## 2017-11-01 NOTE — Unmapped (Signed)
Dr Nolen Mu in to reassess patient and discuss plan of care.

## 2017-11-01 NOTE — Unmapped (Signed)
Pt with 1-2 hours of substernal chest pain and nausea that woke her from sleep. Pt describes pain as pressure. Drinking water exacerbates pain.

## 2017-11-01 NOTE — Unmapped (Signed)
Patient transported to X-ray  Transported by Radiology  How tranported Stretcher  Cardiac Monitor yes

## 2017-11-01 NOTE — Unmapped (Signed)
Pt reports feeling much better after GI cocktail.

## 2017-11-03 NOTE — Unmapped (Addendum)
Spoke to pt son, [name] [name] for check in and verify BG readings over past 2 days. Pt seen by Dr Legrand Como from palliative care on 8/27-- States BG running on adv 200 with each check before meals. Pt feeling relatively well, no pain and L pleurx drained 450 ml day before yesterday-previously this drain was blocked.

## 2017-11-03 NOTE — Unmapped (Signed)
Hi [name] [name] tried to call you back. He confirmed the best number to reach him at is [phone].    Thank you,   Vernie Ammons  [facility]  [phone]

## 2017-11-05 NOTE — Unmapped (Addendum)
INTERVENTIONAL PULMONOLOGY CLINIC FOLLOW-UP NOTE    Assessment:     [name] [name] is a 84 y.o. 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    Her L pleurX catheter became dislodge with the cuff outside of the skin and significant leaking so it was removed in the IP clinic and sutured closed on 8/27.  The PleurX was replaced on 8/29.  At the time of replacement 600 ml of serosanguineous pleural fluid drained.      Since that time patient has been fatigued and nauseous.  Her son has been unable to drain any fluid from the L sided pleurix catheter and attempted to drain it on 8/30 and 9/01.  There is tenderness and erythema of the skin overlying the tunneled portion of the L pleurix concerning for cellulitis.  Bedside ultrasound showed minimal L sided pleural effusion.  The R PleurX has been draining at baseline of approximately ~400 cc every other day.      Plan:     1. Cellulitis overlying the tunneled portion of the L pleurX: Augmentin 875-125 mg BID x 7 days.  Will have follow-up in 1 week.      This patient was seen and evaluated with Dr. Catalina Gravel    Malachi Carl MD  Pulmonary & Critical Care Fellow  Pager [phone]  October 31, 2017    History:     Requesting Physician and Service: [name]     Reason for Consult: Follow up for left PleurX catheter malfunction.    HPI: [name] [name] is a 84 y.o. 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    Her L pleurX catheter became dislodge with the cuff outside of the skin and significant leaking so it was removed in the IP clinic and sutured closed on 8/27.  The PleurX was replaced on 8/29.  At the time of replacement 600 ml of serosanguineous pleural fluid drained.      Since that time patient has been fatigued and nauseous.  Her breathing has essentially been at baseline.  Her son has been unable to drain any fluid from the L sided pleurix catheter and attempted to drain it on 8/30 and 9/01.  Her son is unsure if there has been any redness at the site of her L PleurX as his wife usually dresses it.  The R PleurX has been draining at baseline of approximately ~400 cc every other day.  Denies fevers, chills, SOB, or cough.      Past Medical History:   Diagnosis Date   ??? Atrial fibrillation (CMS-HCC)    ??? Cancer (CMS-HCC)    ??? Diabetes mellitus (CMS-HCC)    ??? Hypertension    ??? Metastatic lung cancer (metastasis from lung to other site) (CMS-HCC)    ??? Pulmonary embolism (CMS-HCC)        Past Surgical History:   Procedure Laterality Date   ??? CHG RAD GUIDED,PERCUT DRAINAGE,W/CATH PLACE Right 06/19/2017    Procedure: RADIOLOGICAL GUIDANCE, FOR PERCUTANEOUS DRAINAGE, WITH PLACEMENT OF CATHETER, RAD S&I;  Surgeon: Sherwood Gambler, MD;  Location: [facility];  Service: Pulmonary   ??? CHOLECYSTECTOMY     ???  HYSTERECTOMY     ??? neck surgey     ??? PR INSERTION INDWELLING TUNNELED PLEURAL CATHETER Bilateral 06/19/2017    Procedure: INSERTION OF INDWELLING TUNNELED PLEURAL CATHETER WITH CUFF WITH MODERATE SEDATION;  Surgeon: Sherwood Gambler, MD;  Location: [facility];  Service: Pulmonary   ??? PR INSERTION INDWELLING TUNNELED PLEURAL CATHETER Left 10/26/2017    Procedure: INSERTION OF INDWELLING TUNNELED PLEURAL CATHETER WITH CUFF;  Surgeon: Mercy Moore, MD;  Location: MAIN OR [facility];  Service: Pulmonary   ??? PR MOD SED SAME PHYS/QHP EACH ADDL 15 MINS  06/19/2017    Procedure: MODERATE SEDATION SERVICES PROVIDED BY SAME PHYSICIAN OR OTHER QUALIFIED HEALTH CARE PROFESSIONAL PERFORMING THE DIAGNOSTIC OR THERAPEUTIC SERVICE THAT SEDATION SUPPORTS, EACH ADDITIONAL 15 MINS;  Surgeon: Sherwood Gambler, MD;  Location: [facility] PROCED[facility] LAB [facility];  Service: Pulmonary   ??? PR MOD SED SAME PHYS/QHP INITIAL 15 MINS 5/> YRS  06/19/2017    Procedure: MODERATE SEDATION SERVICES PROVIDED BY SAME PHYSICIAN OR OTHER QUALIFIED HC PROFESSIONAL PERFORMING THE DIAGNOSTIC OR THERAPEUTIC SERVICE THAT SEDATION SUPPORTS, INIT 15 MINS, PT AGE 27 YEARS OR OLDER;  Surgeon: Sherwood Gambler, MD;  Location: [facility] LAB [facility];  Service: Pulmonary       Current Outpatient Medications   Medication Sig Dispense Refill   ??? blood sugar diagnostic (ACCU-CHEK AVIVA) Strp      ??? calcium carbonate-vitamin D3 (CALTRATE 600 + D) 600 mg (1,500 mg)-800 unit Chew Chew 1 tablet Two (2) times a day.     ??? crizotinib (XALKORI) 250 mg capsule Take 1 capsule (250 mg total) by mouth Two (2) times a day. 60 capsule 3   ??? furosemide (LASIX) 20 MG tablet Take 20 mg by mouth daily as needed for swelling.     ??? insulin aspart prot/insuln asp (NOVOLOG MIX 70-30 FLEXPEN SUBQ) Inject 9 Units under the skin Two (2) times a day (30 minutes before a meal).     ??? lidocaine 4 % patch Place 1 patch on the skin daily.     ??? losartan (COZAAR) 100 MG tablet Take 1 tablet (100 mg total) by mouth daily. 90 tablet 3   ??? metFORMIN (GLUCOPHAGE-XR) 500 MG 24 hr tablet TAKE 2 TABLETS (1,000 MG TOTAL) BY MOUTH TWO (2) TIMES A DAY. 360 tablet 3   ??? pen needle, diabetic (BD ULTRA-FINE SHORT PEN NEEDLE) 31 gauge x 5/16 Ndle USE AS DIRECTED. USE 4 NEEDLES A DAY WITH DIABETIC MEDICATION PENS AS DIRECTED     ??? potassium chloride SA (K-DUR,KLOR-CON) 20 MEQ tablet Take 1 tablet (20 mEq total) by mouth daily. 90 tablet 1   ??? ranitidine (ZANTAC) 150 MG tablet Take 1 tablet (150 mg total) by mouth Two (2) times a day. 180 tablet 3   ??? simethicone (GAS-X) 80 MG chewable tablet Chew 1 tablet (80 mg total) 4 (four) times a day as needed for flatulence (gas relief). 100 tablet 2   ??? amoxicillin-clavulanate (AUGMENTIN) 875-125 mg per tablet Take 1 tablet by mouth Two (2) times a day. for 7 days 14 tablet 0     No current facility-administered medications for this visit.        Allergies as of 10/31/2017 - Reviewed 10/31/2017   Allergen Reaction Noted   ??? Sulfasalazine Nausea Only 12/22/2014       Family History   Problem Relation Age of Onset   ??? Cancer Brother         lung   ???  Cancer Daughter         lung   ??? Cancer Daughter         breast       Social History     Socioeconomic History   ??? Marital status: Widowed     Spouse name: Not on file   ??? Number of children: Not on file   ??? Years of education: Not on file   ??? Highest education level: Not on file   Occupational History   ??? Not on file   Social Needs   ??? Financial resource strain: Not on file   ??? Food insecurity:     Worry: Not on file     Inability: Not on file   ??? Transportation needs:     Medical: Not on file     Non-medical: Not on file   Tobacco Use   ??? Smoking status: Never Smoker   ??? Smokeless tobacco: Never Used   Substance and Sexual Activity   ??? Alcohol use: No   ??? Drug use: Never   ??? Sexual activity: Not on file   Lifestyle   ??? Physical activity:     Days per week: Not on file     Minutes per session: Not on file   ??? Stress: Not on file   Relationships   ??? Social connections:     Talks on phone: Not on file     Gets together: Not on file     Attends religious service: Not on file     Active member of club or organization: Not on file     Attends meetings of clubs or organizations: Not on file     Relationship status: Not on file   Other Topics Concern   ??? Do you use sunscreen? No   ??? Tanning bed use? No   ??? Are you easily burned? No   ??? Excessive sun exposure? No   ??? Blistering sunburns? No   Social History Narrative   ??? Not on file     Review of Systems  A 12 point review of systems was negative except for pertinent items noted in the HPI.    Objective:     Physical Examination:   BP 93/56  - Pulse 84  - Temp 36.4 ??C (Oral)  - Resp 18  - Ht 162.6 cm (5' 4)  - Wt 60.8 kg (134 lb)  - SpO2 100%  - BMI 23.00 kg/m??      General appearance - oriented to person, place, and time, tired-appearing, normal appearing weight, acyanotic, in no respiratory distress and chronically ill appearing  Eyes - Sclera anicteric, conjunctiva pink  Mouth - mucous membranes dry, pharynx normal without lesions  Neck - Trachea supple and midline, (-) JVD   Lymphatics - no palpable lymphadenopathy  Heart - normal rate, regular rhythm, normal S1, S2, no murmurs, rubs, clicks or gallops  Chest - clear to auscultation, no wheezes, rales or rhonchi, symmetric air entry  Abdomen - soft, nontender, nondistended, no masses or organomegaly  Extremities - No pedal edema, no clubbing or cyanosis  Skin - erythema and tenderness overlying tunneled tract of L pleurX, no drainage from insertion site  Neurological - alert, oriented, normal speech, no focal findings or movement disorder noted    Labs and Imaging:  I have personally reviewed all of the diagnostic information associated with this encounter.

## 2017-11-06 NOTE — Unmapped (Signed)
ERROR

## 2017-11-08 ENCOUNTER — Ambulatory Visit: Admit: 2017-11-08 | Discharge: 2017-11-08 | Disposition: A | Discharge status: Home / Self Care | Payer: MEDICARE

## 2017-11-08 ENCOUNTER — Emergency Department: Admit: 2017-11-08 | Discharge: 2017-11-08 | Disposition: A | Discharge status: Home / Self Care | Payer: MEDICARE

## 2017-11-08 DIAGNOSIS — Z043 Encounter for examination and observation following other accident: Principal | ICD-10-CM

## 2017-11-08 DIAGNOSIS — I10 Essential (primary) hypertension: Secondary | ICD-10-CM | POA: Diagnosis not present

## 2017-11-08 DIAGNOSIS — Z882 Allergy status to sulfonamides status: Secondary | ICD-10-CM | POA: Diagnosis not present

## 2017-11-08 DIAGNOSIS — Z794 Long term (current) use of insulin: Secondary | ICD-10-CM | POA: Diagnosis not present

## 2017-11-08 DIAGNOSIS — S80811A Abrasion, right lower leg, initial encounter: Secondary | ICD-10-CM | POA: Diagnosis not present

## 2017-11-08 DIAGNOSIS — C3490 Malignant neoplasm of unspecified part of unspecified bronchus or lung: Secondary | ICD-10-CM | POA: Diagnosis not present

## 2017-11-08 DIAGNOSIS — C7800 Secondary malignant neoplasm of unspecified lung: Secondary | ICD-10-CM | POA: Diagnosis not present

## 2017-11-08 DIAGNOSIS — Z90710 Acquired absence of both cervix and uterus: Secondary | ICD-10-CM | POA: Diagnosis not present

## 2017-11-08 DIAGNOSIS — S0101XA Laceration without foreign body of scalp, initial encounter: Secondary | ICD-10-CM | POA: Diagnosis not present

## 2017-11-08 DIAGNOSIS — Z86711 Personal history of pulmonary embolism: Secondary | ICD-10-CM | POA: Diagnosis not present

## 2017-11-08 DIAGNOSIS — Z7901 Long term (current) use of anticoagulants: Secondary | ICD-10-CM | POA: Diagnosis not present

## 2017-11-08 DIAGNOSIS — S81012A Laceration without foreign body, left knee, initial encounter: Secondary | ICD-10-CM | POA: Diagnosis not present

## 2017-11-08 DIAGNOSIS — Z9049 Acquired absence of other specified parts of digestive tract: Secondary | ICD-10-CM | POA: Diagnosis not present

## 2017-11-08 DIAGNOSIS — S0181XA Laceration without foreign body of other part of head, initial encounter: Secondary | ICD-10-CM | POA: Diagnosis not present

## 2017-11-08 DIAGNOSIS — E119 Type 2 diabetes mellitus without complications: Secondary | ICD-10-CM | POA: Diagnosis not present

## 2017-11-08 DIAGNOSIS — Z79899 Other long term (current) drug therapy: Secondary | ICD-10-CM | POA: Diagnosis not present

## 2017-11-08 DIAGNOSIS — S098XXA Other specified injuries of head, initial encounter: Secondary | ICD-10-CM | POA: Diagnosis not present

## 2017-11-08 DIAGNOSIS — S0990XA Unspecified injury of head, initial encounter: Secondary | ICD-10-CM | POA: Diagnosis not present

## 2017-11-08 DIAGNOSIS — S8992XA Unspecified injury of left lower leg, initial encounter: Secondary | ICD-10-CM | POA: Diagnosis not present

## 2017-11-08 DIAGNOSIS — Z801 Family history of malignant neoplasm of trachea, bronchus and lung: Secondary | ICD-10-CM | POA: Diagnosis not present

## 2017-11-08 DIAGNOSIS — M25462 Effusion, left knee: Secondary | ICD-10-CM | POA: Diagnosis not present

## 2017-11-08 DIAGNOSIS — Z803 Family history of malignant neoplasm of breast: Secondary | ICD-10-CM | POA: Diagnosis not present

## 2017-11-08 DIAGNOSIS — I4891 Unspecified atrial fibrillation: Secondary | ICD-10-CM | POA: Diagnosis not present

## 2017-11-08 NOTE — Unmapped (Signed)
Pt fell at home after tripping over the rug.  Fell on her knees and hit her head on the coffee table.  Denies LOC.  Was able to ambulate after the fall.  A/o x 4.  Family at bedside.

## 2017-11-08 NOTE — Unmapped (Signed)
This NA moved patient from eye room to ED Room 6 via wheelchair

## 2017-11-08 NOTE — Unmapped (Signed)
Report received care assumed.  No pending orders.  Pt waiting for CT scan results and laceration repair by provider.  Noted xr result of no fracture.  No change in care needed as a result of this.

## 2017-11-08 NOTE — Unmapped (Signed)
Assisted to bathroom via wheelchair.  Pt able to pivot and move with 1-2 assist.  Usually uses walker at home

## 2017-11-08 NOTE — Unmapped (Signed)
Pt had mechanical fall from standing this morning and struck her head on the floor. No LOC. Laceration noted above right eye. Pt states she tripped over a throw rug. Pt take eliquis daily.

## 2017-11-08 NOTE — Unmapped (Signed)
Report given to Mike RN.

## 2017-11-08 NOTE — Unmapped (Signed)
[facility] Emergency Department Provider Note    ED Clinical Impression     Final diagnoses:   Fall, initial encounter (Primary)   Injury of head, initial encounter   Laceration of scalp without foreign body, initial encounter   Laceration of left knee, initial encounter       Initial Impression, ED Course, Assessment and Plan   84 y.o. female with a PMH of A-fib (on Eliquis), DM, hypertension, metastatic lung cancer presenting to the ED for evaluation after a mechanical fall at home earlier today. Negative LOC, lacerations to bilateral knees and forehead. 1 inch laceration to right upper forehead with underlying contusion, it is hemostatic. No spinous process tenderness, FROM neck without pain. Dentition is intact without cracks. No evidence of facial trauma other than lac to forehead. 2+ edema up to her knees. 2 cm abrasion to right anterior calf. 1 inch laceration over left proximal anterior tibia with skin tear with fat exposure. FROM of joint without pain with valgus and varus. Negative Lachman's test. Normal strength and sensation. Low suspicion of joint involvement. Talking in complete sentences, no epitaxis. Extensive neurologic exam revealed no focal deficits.     Will check CT head as she is on Eliquis with hit to head. Will repair laceration to left knee and forehead. Tetanus already UTD.     12:27 PM  Laceration repairs were tolerated well (see procedure notes). XR knee is unremarkable, no air, or radiopaque foreign bodies in the joint. I irrigated and explored the wound extensively during repair. No concern for disruption of joint capsule. Knee exam is benign, location of lac is inferior enough from patella, low suspicion of penetrating joint wound. Pending CT head at this time.     1:32 PM  CT head is unremarkable aside from previously identified chronic changes. Plan to discharge home with wound care instructions, return precautions, and instructions to follow-up for suture removal. Strict return precautions given. Patient expresses understanding and is agreeable to the plan.      Discussed my evaluation of the patient's symptoms, my clinical impression, and my proposed outpatient treatment plan with patient. We have discussed anticipatory guidance, scheduled follow-up, and careful return precautions. Patient expresses understanding, is agreeable and comfortable with the discharge plan. Denies any questions or concerns at this time. NAD noted upon discharge     Radiology     CT head WO contrast   Final Result   No acute intracranial abnormality.       Chronic changes as above.      XR Knee 1 or 2 Views Left   Final Result   No acute osseous abnormality of left knee.       Moderate soft tissue swelling of the knee with small knee effusion.              History     Chief Complaint  Fall      HPI   Patient was seen by me at 10:57 AM.    Patient is a 84 y.o. female with a PMH of A-fib (on Eliquis), DM, hypertension, and metastatic lung cancer presenting to the ED for evaluation after a mechanical fall at home earlier today. She reports her walker got caught on the rug, causing her to trip and land mostly on her left knee before striking her forehead on the floor. She denies LOC, but she was slow to get up 2/2 knee pain. Her son helped her off the ground, and reports she was walking at her  baseline afterward. She denies headache, blurry vision, double vision, neck pain. She ambulates with a walker at baseline.     Per Care Everywhere, she had a Tdap on 03/15/2017.     Previous chart, nursing notes, and vital signs reviewed.      Pertinent labs & imaging results that were available during my care of the patient were reviewed by me and considered in my medical decision making (see chart for details).    Past Medical History:   Diagnosis Date   ??? Atrial fibrillation (CMS-HCC)    ??? Cancer (CMS-HCC)    ??? Diabetes mellitus (CMS-HCC)    ??? Hypertension    ??? Metastatic lung cancer (metastasis from lung to other site) (CMS-HCC)    ??? Pulmonary embolism (CMS-HCC)        Past Surgical History:   Procedure Laterality Date   ??? CHG RAD GUIDED,PERCUT DRAINAGE,W/CATH PLACE Right 06/19/2017    Procedure: RADIOLOGICAL GUIDANCE, FOR PERCUTANEOUS DRAINAGE, WITH PLACEMENT OF CATHETER, RAD S&I;  Surgeon: Sherwood Gambler, MD;  Location: BRONCH PROCEDURE LAB [facility];  Service: Pulmonary   ??? CHOLECYSTECTOMY     ??? HYSTERECTOMY     ??? neck surgey     ??? PR INSERTION INDWELLING TUNNELED PLEURAL CATHETER Bilateral 06/19/2017    Procedure: INSERTION OF INDWELLING TUNNELED PLEURAL CATHETER WITH CUFF WITH MODERATE SEDATION;  Surgeon: Sherwood Gambler, MD;  Location: BRONCH PROCEDURE LAB [facility];  Service: Pulmonary   ??? PR INSERTION INDWELLING TUNNELED PLEURAL CATHETER Left 10/26/2017    Procedure: INSERTION OF INDWELLING TUNNELED PLEURAL CATHETER WITH CUFF;  Surgeon: Mercy Moore, MD;  Location: MAIN OR [facility];  Service: Pulmonary   ??? PR MOD SED SAME PHYS/QHP EACH ADDL 15 MINS  06/19/2017    Procedure: MODERATE SEDATION SERVICES PROVIDED BY SAME PHYSICIAN OR OTHER QUALIFIED HEALTH CARE PROFESSIONAL PERFORMING THE DIAGNOSTIC OR THERAPEUTIC SERVICE THAT SEDATION SUPPORTS, EACH ADDITIONAL 15 MINS;  Surgeon: Sherwood Gambler, MD;  Location: BRONCH PROCEDURE LAB [facility];  Service: Pulmonary   ??? PR MOD SED SAME PHYS/QHP INITIAL 15 MINS 5/> YRS  06/19/2017    Procedure: MODERATE SEDATION SERVICES PROVIDED BY SAME PHYSICIAN OR OTHER QUALIFIED HC PROFESSIONAL PERFORMING THE DIAGNOSTIC OR THERAPEUTIC SERVICE THAT SEDATION SUPPORTS, INIT 15 MINS, PT AGE 32 YEARS OR OLDER;  Surgeon: Sherwood Gambler, MD;  Location: BRONCH PROCEDURE LAB [facility];  Service: Pulmonary       No current facility-administered medications for this encounter.     Current Outpatient Medications:   ???  blood sugar diagnostic (ACCU-CHEK AVIVA) Strp, , Disp: , Rfl:   ???  calcium carbonate-vitamin D3 (CALTRATE 600 + D) 600 mg (1,500 mg)-800 unit Chew, Chew 1 tablet Two (2) times a day., Disp: , Rfl:   ???  crizotinib (XALKORI) 250 mg capsule, Take 1 capsule (250 mg total) by mouth Two (2) times a day., Disp: 60 capsule, Rfl: 3  ???  furosemide (LASIX) 20 MG tablet, Take 20 mg by mouth daily as needed for swelling., Disp: , Rfl:   ???  insulin aspart prot/insuln asp (NOVOLOG MIX 70-30 FLEXPEN SUBQ), Inject 9 Units under the skin Two (2) times a day (30 minutes before a meal)., Disp: , Rfl:   ???  lidocaine 4 % patch, Place 1 patch on the skin daily., Disp: , Rfl:   ???  losartan (COZAAR) 100 MG tablet, Take 1 tablet (100 mg total) by mouth daily., Disp: 90 tablet, Rfl: 3  ???  metFORMIN (GLUCOPHAGE-XR) 500 MG 24 hr tablet,  TAKE 2 TABLETS (1,000 MG TOTAL) BY MOUTH TWO (2) TIMES A DAY., Disp: 360 tablet, Rfl: 3  ???  pen needle, diabetic (BD ULTRA-FINE SHORT PEN NEEDLE) 31 gauge x 5/16 Ndle, USE AS DIRECTED. USE 4 NEEDLES A DAY WITH DIABETIC MEDICATION PENS AS DIRECTED, Disp: , Rfl:   ???  potassium chloride SA (K-DUR,KLOR-CON) 20 MEQ tablet, Take 1 tablet (20 mEq total) by mouth daily., Disp: 90 tablet, Rfl: 1  ???  ranitidine (ZANTAC) 150 MG tablet, Take 1 tablet (150 mg total) by mouth Two (2) times a day., Disp: 180 tablet, Rfl: 3  ???  simethicone (GAS-X) 80 MG chewable tablet, Chew 1 tablet (80 mg total) 4 (four) times a day as needed for flatulence (gas relief)., Disp: 100 tablet, Rfl: 2    Allergies  Sulfasalazine    Family History   Problem Relation Age of Onset   ??? Cancer Brother         lung   ??? Cancer Daughter         lung   ??? Cancer Daughter         breast       Social History  Social History     Tobacco Use   ??? Smoking status: Never Smoker   ??? Smokeless tobacco: Never Used   Substance Use Topics   ??? Alcohol use: No   ??? Drug use: Never       Review of Systems    Constitutional: Negative for fever or chills. Negative for wt. loss.  Eyes: Negative for visual changes, erythema, drainage.  ENT: Negative for ear pain, loss of hearing. Negative for rhinorrhea or epistaxis. Negative for sore throat, trismus, or hoarse voice.  Cardiovascular: Negative for chest pain or palpitations.  Respiratory: Negative for shortness of breath or cough.  Gastrointestinal: Negative for abdominal pain, nausea, vomiting, constipation or diarrhea.  Genitourinary: Negative for dysuria, urgency, frequency, hesitancy, hematuria. Negative for vaginal discharge or bleeding.  Musculoskeletal: Negative for back pain. Negative for neck pain. Negative for joint pain or swelling.  Skin: Positive for laceration to forehead, bilateral knees.   Neurological: Negative for headaches, weakness, AMS, LOC, dizziness, or numbness.  Psych: Negative for SI, HI, A/V Hallucinations. Negative for agitation.     Physical Exam     VITAL SIGNS:    Vitals:    11/08/17 1041 11/08/17 1336   BP: 102/52 107/56   Pulse: 80 73   Resp: 12 18   Temp: 36.4 ??C (97.5 ??F) 36.1 ??C (97 ??F)   TempSrc: Oral Oral   SpO2: 94% 99%         Constitutional: Alert and oriented. Well appearing and in no distress.  Eyes: Conjunctivae are normal. PERRL. EOMs intact.   Head: No evidence of facial trauma other than lac to forehead. Normocephalic and atraumatic.  Ear: EACs without exudate or erythema. TMs without erythema or effusion  Nose: No congestion. No epistaxis  Mouth/Throat: Dentition is intact without cracks. Mucous membranes are moist without lesions/ulcerations. Posterior Oropharynx patent. Tonsils without erythema or exudate. Uvula is midline. Soft Palate is soft without induration. Dentition intact without cracks/decay.  Neck: No stridor. Thyroid without nodules. Full ROM without pain. No spinous Process tenderness. No nuchal rigidity.  Hematological/Lymphatic/Immunilogical: No cervical lymphadenopathy.  Cardiovascular: Normal rate, regular rhythm. Normal and symmetric distal pulses are present in all extremities. No gallops, murmurs, or clicks.  Respiratory: Normal respiratory effort. Breath sounds are normal.  Gastrointestinal: Soft and nontender. BS active. No guarding or  rigidity. Negative McBurney's point. Negative Murphy's sign. No CVA tenderness.  Musculoskeletal: No spinous process tenderness, FROM neck without pain.  2+ edema up to her bilateral knees. FROM of left knee without pain with valgus and varus. Negative Lachman's test. Normal strength and sensation. Low suspicion of joint involvement.   Neurologic: Normal speech and language. Cranial nerves II-XII intact (PERRL, EOMI, normal sensation in V1, V2 and V3 distributions, symmetric smile and eyebrow raise, normal hearing, tongue protrudes midline and patient moves it to the L and R without difficulty, uvula midline, 5/5 strength with shoulder shrug).  5 out of 5 strength in finger grip, biceps, triceps, deltoids bilaterally.  5 out of 5 strength with hip flexion, knee flexion and extension, ankle dorsiflexion, and ankle plantar flexion.  Normal sensation in upper extremities and lower extremities bilaterally.  No pronator drift.  Normal coordination finger-nose testing and rapid alternating movements.  2+ reflexes and biceps and patella bilaterally.  Negative Romberg sign.   Skin: 1 inch laceration to right upper forehead with underlying contusion, it is hemostatic. 2 cm abrasion to right anterior calf. 1 inch laceration over left proximal anterior tibia with skin tear with fat exposure. Skin is warm and dry. No rash noted. No pallor.  Psychiatric: Mood and affect are normal. Speech and behavior are normal.    Procedures     Lac Repair  Date/Time: 11/08/2017 12:09 PM  Performed by: Teana Lindahl [place], AGNP  Authorized by: Dee Maday [employer], [place]     Consent:     Consent obtained:  Verbal    Consent given by:  Patient    Risks discussed:  Infection and pain    Alternatives discussed:  No treatment  Anesthesia (see MAR for exact dosages):     Anesthesia method:  Local infiltration    Local anesthetic:  Lidocaine 1% w/o epi  Laceration details:     Location:  Face    Face location:  Forehead (right upper)    Length (cm):  2.5  Repair type:     Repair type:  Simple  Pre-procedure details:     Preparation:  Patient was prepped and draped in usual sterile fashion and imaging obtained to evaluate for foreign bodies  Treatment:     Area cleansed with:  Betadine    Amount of cleaning:  Standard    Visualized foreign bodies/material removed: no    Skin repair:     Repair method:  Sutures    Suture size:  6-0    Suture material:  Prolene    Suture technique:  Simple interrupted    Number of sutures:  6  Approximation:     Approximation:  Close  Post-procedure details:     Dressing:  Antibiotic ointment    Patient tolerance of procedure:  Tolerated well, no immediate complications  Lac Repair  Date/Time: 11/08/2017 12:11 PM  Performed by: Macee Venables [place], AGNP  Authorized by: Lariah Fleer [place], [place]     Consent:     Consent obtained:  Verbal    Consent given by:  Patient    Risks discussed:  Infection and pain    Alternatives discussed:  No treatment and referral  Anesthesia (see MAR for exact dosages):     Anesthesia method:  Local infiltration    Local anesthetic:  Lidocaine 1% w/o epi  Laceration details:     Location:  Leg    Leg location:  L lower leg (proximal anterior tibia)    Length (cm):  2.5  Repair type:     Repair type:  Simple  Pre-procedure details:     Preparation:  Patient was prepped and draped in usual sterile fashion and imaging obtained to evaluate for foreign bodies  Exploration:     Wound exploration: wound explored through full range of motion and entire depth of wound probed and visualized    Treatment:     Area cleansed with:  Betadine    Amount of cleaning:  Standard    Visualized foreign bodies/material removed: no    Skin repair:     Repair method:  Sutures    Suture size:  4-0    Suture material:  Prolene    Suture technique:  Simple interrupted    Number of sutures:  8  Approximation:     Approximation:  Close  Post-procedure details:     Dressing:  Non-adherent dressing and antibiotic ointment    Patient tolerance of procedure:  Tolerated well, no immediate complications        Documentation assistance was provided by Vergia Alberts, Scribe, on November 08, 2017 at 11:34 AM for [employer], AGNP.    November 08, 2017 4:25 PM. Documentation assistance provided by the scribe. I was present during the time the encounter was recorded. The information recorded by the scribe was done at my direction and has been reviewed and validated by me.              Edelmira Gallogly Cox [place], [place]  11/09/17 [phone]

## 2017-11-08 NOTE — Unmapped (Signed)
CT scan on hold (pt w/MD)

## 2017-11-09 NOTE — Unmapped (Signed)
Patient's son called regarding [name] [name]. She presented to the [facility] ED today after a mechanical fall with lacerations to her forehead and knee. She received sutures to the knee with hemostasis but had some bleeding after taking her evening dose of Eliquis (for PE last documented on CTA in June 2019) and walking to the bathroom. Hemostasis was achieved at home with pressure for a couple of minutes. Her son is asking whether she should continue taking the Eliquis.    Based on the indication for anticoagulation: PE  AND  Non-modifiable risk factor: Malignancy    I have recommended that they continue the Eliquis as scheduled due to negative head CT on emergency room visit today and easily achievable hemostasis at home. I have recommended that the son seek medical attention if hemostasis is not possible in the future after 15 minutes of manual pressure.

## 2017-11-13 NOTE — Unmapped (Signed)
[facility] Refill Coordination Note  Specialty Medication(s): Xalkori 250mg       ADRIJANA HAROS, DOB: 01/26/1931  Phone: [phone] (home) , Alternate phone contact: N/A  Phone or address changes today?: No  All above HIPAA information was verified with [name] (son)  Shipping Address: [address]  [place] [address]   Insurance changes? No    Completed refill call assessment today to schedule patient's medication shipment from the [facility] [phone]).      Confirmed the medication and dosage are correct and have not changed: Yes, regimen is correct and unchanged.    Confirmed patient started or stopped the following medications in the past month:  No, there are no changes reported at this time.    Are you tolerating your medication?:  Nico reports tolerating the medication.    ADHERENCE  Did you miss any doses in the past 4 weeks? No missed doses reported.    FINANCIAL/SHIPPING    Delivery Scheduled: Yes, Expected medication delivery date: 11/16/2017     The patient will receive a drug information handout for each medication shipped and additional FDA Medication Guides as required.      Maleeka did not have any additional questions at this time.    Delivery address validated in Epic.    We will follow up with patient monthly for standard refill processing and delivery.      Thank you,  Delsy Etzkorn  Anders Grant   [facility] Specialty Pharmacist

## 2017-11-14 NOTE — Unmapped (Signed)
Hi,     [name] [name] has contacted the Communication Center in regards to the symptom(s) below:    Symptom: blood in stool; stool otherwise not unusually dark  New:  yes  Onset: less than 24 hours    If the symptom is indicated on our Urgent Page List, the charge nurse has been contacted and/or an urgent page has been sent to the preferred Triage Pool!      Please contact [name] at [phone] for follow up.     Thank you,   Kelli Hope  [facility]   [phone]      URGENT LIST- Bleeding - New or Severe / Blood Clot / Bowel Movement - sudden loss / Breathing - Shortness of breath / Chest Pain -New or Worsening / Confused / Diarrhea - Severe / Dizziness / Falling / Fever of 100.5 or higher / Leg - Arm Swelling with Pain / Lightheadedness / Pain that is Intolerable / Painful Urination / Passing out / Seizures / Vision Changes

## 2017-11-15 DIAGNOSIS — J910 Malignant pleural effusion: Secondary | ICD-10-CM | POA: Diagnosis not present

## 2017-11-15 MED FILL — XALKORI 250 MG CAPSULE: ORAL | 30 days supply | Qty: 60 | Fill #1

## 2017-11-15 MED FILL — XALKORI 250 MG CAPSULE: 30 days supply | Qty: 60 | Fill #1 | Status: AC

## 2017-11-20 ENCOUNTER — Ambulatory Visit: Admit: 2017-11-20 | Discharge: 2017-11-21 | Payer: MEDICARE

## 2017-11-20 DIAGNOSIS — C3490 Malignant neoplasm of unspecified part of unspecified bronchus or lung: Principal | ICD-10-CM

## 2017-11-20 DIAGNOSIS — Z9689 Presence of other specified functional implants: Secondary | ICD-10-CM | POA: Diagnosis not present

## 2017-11-20 DIAGNOSIS — J90 Pleural effusion, not elsewhere classified: Secondary | ICD-10-CM | POA: Diagnosis not present

## 2017-11-20 DIAGNOSIS — J984 Other disorders of lung: Secondary | ICD-10-CM | POA: Diagnosis not present

## 2017-11-20 DIAGNOSIS — M438X4 Other specified deforming dorsopathies, thoracic region: Secondary | ICD-10-CM | POA: Diagnosis not present

## 2017-11-20 DIAGNOSIS — R918 Other nonspecific abnormal finding of lung field: Secondary | ICD-10-CM | POA: Diagnosis not present

## 2017-11-21 ENCOUNTER — Ambulatory Visit
Admit: 2017-11-21 | Discharge: 2017-11-21 | Payer: MEDICARE | Attending: Geriatric Medicine | Primary: Geriatric Medicine

## 2017-11-21 ENCOUNTER — Ambulatory Visit
Admit: 2017-11-21 | Discharge: 2017-11-21 | Payer: MEDICARE | Attending: Critical Care Medicine | Primary: Critical Care Medicine

## 2017-11-21 ENCOUNTER — Other Ambulatory Visit: Admit: 2017-11-21 | Discharge: 2017-11-21 | Payer: MEDICARE

## 2017-11-21 ENCOUNTER — Ambulatory Visit
Admit: 2017-11-21 | Discharge: 2017-11-21 | Payer: MEDICARE | Attending: Hematology & Oncology | Primary: Hematology & Oncology

## 2017-11-21 DIAGNOSIS — W19XXXD Unspecified fall, subsequent encounter: Secondary | ICD-10-CM

## 2017-11-21 DIAGNOSIS — C3490 Malignant neoplasm of unspecified part of unspecified bronchus or lung: Secondary | ICD-10-CM

## 2017-11-21 DIAGNOSIS — R601 Generalized edema: Secondary | ICD-10-CM

## 2017-11-21 DIAGNOSIS — R530 Neoplastic (malignant) related fatigue: Principal | ICD-10-CM

## 2017-11-21 DIAGNOSIS — Z515 Encounter for palliative care: Secondary | ICD-10-CM

## 2017-11-21 DIAGNOSIS — J910 Malignant pleural effusion: Principal | ICD-10-CM

## 2017-11-21 DIAGNOSIS — C787 Secondary malignant neoplasm of liver and intrahepatic bile duct: Secondary | ICD-10-CM | POA: Diagnosis not present

## 2017-11-21 DIAGNOSIS — R609 Edema, unspecified: Secondary | ICD-10-CM | POA: Diagnosis not present

## 2017-11-21 DIAGNOSIS — I4891 Unspecified atrial fibrillation: Secondary | ICD-10-CM | POA: Diagnosis not present

## 2017-11-21 DIAGNOSIS — I313 Pericardial effusion (noninflammatory): Secondary | ICD-10-CM | POA: Diagnosis not present

## 2017-11-21 DIAGNOSIS — Z9049 Acquired absence of other specified parts of digestive tract: Secondary | ICD-10-CM | POA: Diagnosis not present

## 2017-11-21 DIAGNOSIS — Z7901 Long term (current) use of anticoagulants: Secondary | ICD-10-CM | POA: Diagnosis not present

## 2017-11-21 DIAGNOSIS — I10 Essential (primary) hypertension: Secondary | ICD-10-CM | POA: Diagnosis not present

## 2017-11-21 DIAGNOSIS — Z801 Family history of malignant neoplasm of trachea, bronchus and lung: Secondary | ICD-10-CM | POA: Diagnosis not present

## 2017-11-21 DIAGNOSIS — Z794 Long term (current) use of insulin: Secondary | ICD-10-CM | POA: Diagnosis not present

## 2017-11-21 DIAGNOSIS — Z803 Family history of malignant neoplasm of breast: Secondary | ICD-10-CM | POA: Diagnosis not present

## 2017-11-21 DIAGNOSIS — E119 Type 2 diabetes mellitus without complications: Secondary | ICD-10-CM | POA: Diagnosis not present

## 2017-11-21 LAB — MANUAL DIFFERENTIAL
EOSINOPHILS - ABS (DIFF): 0.1 10*9/L (ref 0.0–0.4)
EOSINOPHILS - REL (DIFF): 2 %
LYMPHOCYTES - ABS (DIFF): 0.9 10*9/L — ABNORMAL LOW (ref 1.5–5.0)
LYMPHOCYTES - REL (DIFF): 16 %
MONOCYTES - ABS (DIFF): 0.4 10*9/L (ref 0.2–0.8)
MONOCYTES - REL (DIFF): 7 %
NEUTROPHILS - ABS (DIFF): 4.3 10*9/L (ref 2.0–7.5)
NEUTROPHILS - REL (DIFF): 75 %

## 2017-11-21 LAB — COMPREHENSIVE METABOLIC PANEL
ALBUMIN: 1.8 g/dL — ABNORMAL LOW (ref 3.5–5.0)
ALKALINE PHOSPHATASE: 129 U/L — ABNORMAL HIGH (ref 38–126)
ALT (SGPT): 42 U/L (ref 15–48)
ANION GAP: 2 mmol/L — ABNORMAL LOW (ref 9–15)
AST (SGOT): 23 U/L (ref 14–38)
BILIRUBIN TOTAL: 0.4 mg/dL (ref 0.0–1.2)
BLOOD UREA NITROGEN: 21 mg/dL (ref 7–21)
BUN / CREAT RATIO: 22
CALCIUM: 7.9 mg/dL — ABNORMAL LOW (ref 8.5–10.2)
CHLORIDE: 100 mmol/L (ref 98–107)
CO2: 29 mmol/L (ref 22.0–30.0)
CREATININE: 0.95 mg/dL (ref 0.60–1.00)
EGFR CKD-EPI NON-AA FEMALE: 55 mL/min/{1.73_m2} — ABNORMAL LOW (ref >=60)
POTASSIUM: 3.3 mmol/L — ABNORMAL LOW (ref 3.5–5.0)
PROTEIN TOTAL: 4.6 g/dL — ABNORMAL LOW (ref 6.5–8.3)
SODIUM: 131 mmol/L — ABNORMAL LOW (ref 135–145)

## 2017-11-21 LAB — SMEAR REVIEW

## 2017-11-21 LAB — CBC W/ AUTO DIFF
HEMATOCRIT: 25.1 % — ABNORMAL LOW (ref 36.0–46.0)
HEMOGLOBIN: 9 g/dL — ABNORMAL LOW (ref 12.0–16.0)
MEAN CORPUSCULAR HEMOGLOBIN CONC: 35.8 g/dL (ref 31.0–37.0)
MEAN CORPUSCULAR HEMOGLOBIN: 23.4 pg — ABNORMAL LOW (ref 26.0–34.0)
MEAN CORPUSCULAR VOLUME: 65.3 fL — ABNORMAL LOW (ref 80.0–100.0)
MEAN PLATELET VOLUME: 7.4 fL (ref 7.0–10.0)
NUCLEATED RED BLOOD CELLS: 2 /100{WBCs} (ref <=4)
PLATELET COUNT: 329 10*9/L (ref 150–440)
RED CELL DISTRIBUTION WIDTH: 20.8 % — ABNORMAL HIGH (ref 12.0–15.0)
WBC ADJUSTED: 5.7 10*9/L (ref 4.5–11.0)

## 2017-11-21 LAB — GLUCOSE RANDOM: Glucose:MCnc:Pt:Ser/Plas:Qn:: 220 — ABNORMAL HIGH

## 2017-11-21 LAB — HYPERCHROMASIA

## 2017-11-21 LAB — PRO-BNP: Natriuretic peptide.B prohormone N-Terminal:MCnc:Pt:Ser/Plas:Qn:: 474

## 2017-11-21 NOTE — Unmapped (Addendum)
Thank you for allowing [place] to participate in your care. Please do not hesitate to contact us with issues or questions.   If drainage less than 50ml per PleurX 3 times in a row (and 2 days apart between drainage), that is when that PleurX catheter can come out so please notify us if that happens.    Evalyn Casco, M.D.  Clinical Assistant Professor  Interventional Pulmonology     [facility] Interventional Pulmonology  Tiana Loft, M.D.  Evalyn Casco, M.D.  Roby Lofts, M.D.    Fellow: Catalina Gravel D.O.    During business hours please contact Tammy Allred at [phone] or e-mail her at [email or web address] for medical questions or Darrel Reach at [phone] for procedure scheduling questions or schedule changes.  The nursing triage line is [phone].    Sign up for MyChart to see your results, appointments and communicate directly with your doctors at [email or web address]    For urgent issues after hours please call [phone] [facility] Operator) and ask to speak with the Interventional Pulmonary Fellow on call.

## 2017-11-21 NOTE — Unmapped (Signed)
HPI:  [name] [name] is an 84 yo F, never smoker, who initially became symptomatic  with dyspnea and cough in late Feb 2019.   History as follows:  Oncology History    - 04/30/17: Presented to ED with dyspnea for over 2 weeks.  CTA with PEs, b/l pleural effusions, mediastinal soft tissue mass 9/5/7cm, invading LUL nd 8.2cm lesion inleft hepatic lobe  - 05/17/17: b/l thoracentesis with 2L removed each lung.  Cytology with no malignant cells but lymphocyte predominant exudative effusiions  - 05/18/17: Biopsy liver lesion c/w lung adenocarcinoma        She lives with her her 2 teenage grandsons and has attentive sons/daughter close by.  Her daughter-in-law has been sleeping overnight with her since diagnosis.  She dresses herself, showers, able to do ADLs but family does most of cooking, grocery shopping and cleaning.  Her son manages her medicines and finances. She is otherwise feeling reasonable well.  Has some fatigue. No falls. Does not use a walker or cane.  She is able to walk around [place] but after exertion like this, has some chest pressure and has to sit down to rest, with resolution usually by .  No stairs at home.  She has insulin dependent DM, HTN and Afib, controlled with meds.      Interval Hx  Initiated crizotinib on 06/23/17 and dose reduced to 250mg  QD on 07/17/17 and then went back up again to BID on 07/24/17.  Had recent mechanical fall.  Has had pleurx issues.  Feels tired.    ROS:  10 systems were reviewed and found to be within normal limits except as described in the HPI.    MEDICATIONS:  Current Outpatient Medications on File Prior to Visit   Medication Sig Dispense Refill   ??? [EXPIRED] amoxicillin-clavulanate (AUGMENTIN) 875-125 mg per tablet Take 1 tablet by mouth Two (2) times a day. for 7 days 14 tablet 0   ??? blood sugar diagnostic (ACCU-CHEK AVIVA) Strp      ??? calcium carbonate-vitamin D3 (CALTRATE 600 + D) 600 mg (1,500 mg)-800 unit Chew Chew 1 tablet Two (2) times a day.     ??? crizotinib (XALKORI) 250 mg capsule Take 1 capsule (250 mg total) by mouth Two (2) times a day. 60 capsule 3   ??? furosemide (LASIX) 20 MG tablet Take 20 mg by mouth daily as needed for swelling.     ??? insulin aspart prot/insuln asp (NOVOLOG MIX 70-30 FLEXPEN SUBQ) Inject 9 Units under the skin Two (2) times a day (30 minutes before a meal).     ??? lidocaine 4 % patch Place 1 patch on the skin daily.     ??? losartan (COZAAR) 100 MG tablet Take 1 tablet (100 mg total) by mouth daily. 90 tablet 3   ??? metFORMIN (GLUCOPHAGE-XR) 500 MG 24 hr tablet TAKE 2 TABLETS (1,000 MG TOTAL) BY MOUTH TWO (2) TIMES A DAY. 360 tablet 3   ??? pen needle, diabetic (BD ULTRA-FINE SHORT PEN NEEDLE) 31 gauge x 5/16 Ndle USE AS DIRECTED. USE 4 NEEDLES A DAY WITH DIABETIC MEDICATION PENS AS DIRECTED     ??? potassium chloride SA (K-DUR,KLOR-CON) 20 MEQ tablet Take 1 tablet (20 mEq total) by mouth daily. 90 tablet 1   ??? ranitidine (ZANTAC) 150 MG tablet Take 1 tablet (150 mg total) by mouth Two (2) times a day. 180 tablet 3   ??? simethicone (GAS-X) 80 MG chewable tablet Chew 1 tablet (80 mg total) 4 (four)  times a day as needed for flatulence (gas relief). 100 tablet 2     No current facility-administered medications on file prior to visit.        ALLERGIES:   Allergies   Allergen Reactions   ??? Sulfasalazine Nausea Only       PMH:  Past Medical History:   Diagnosis Date   ??? Atrial fibrillation (CMS-HCC)    ??? Cancer (CMS-HCC)    ??? Diabetes mellitus (CMS-HCC)    ??? Hypertension    ??? Metastatic lung cancer (metastasis from lung to other site) (CMS-HCC)    ??? Pulmonary embolism (CMS-HCC)        SOCIAL HISTORY:  Lives with her 2 teenage grandsons. Her sons, daughter live nearby.    Never smoker  No alcohol    FAMILY HISTORY:  No hx of cancer in her parents.  Daughter has breast cancer.  Another daughter died of lung cancer and was a heavy smoker for many years.     PHYSICAL EXAMINATION:      11/21/2017   Temp 36.4 ??C (97.6 ??F)   Pulse 75   BP 90/53   Pain Level: Denies Pain   Height 1.626 m (5' 4)   Weight 61.9 kg (136 lb 6.4 oz)   BSA (Calculated - sq m) 1.67 sq meters     ECOG 1  GENERAL: Thin, pleasant woman not in acute distress  MOUTH: Moist and clear without thrush  NECK: Supple  LYMPH: No cervical or supraclavicular lymphadenopathy  CHEST: CTAB  CV: Nl s1/s2  ABD: Soft, not tender, not distended, no palpable organomegaly  EXTREMITIES: Warm and well perfused x 4 without cyanosis, clubbing.  RLE unchanged (has previuosly had doppler US)  MUSKOLOSKELETAL: No spinal tenderness to palpation  NEURO: CN2-12 intact.  Speech and gait within normal limits  AFFECT: Varied and appropriate to situation  DERM: No lesions noted    LABS:  Results for WAI, MINOTTI (MRN [id number]) as of 11/21/2017 09:57   Ref. Range 11/01/2017 01:52 11/01/2017 03:22   WBC Latest Ref Range: 4.5 - 11.0 10*9/L  8.6   RBC Latest Ref Range: 4.00 - 5.20 10*12/L  3.77 (L)   HGB Latest Ref Range: 13.5 - 16.0 g/dL  8.8 (L)   HCT Latest Ref Range: 36.0 - 46.0 %  24.2 (L)   MCV Latest Ref Range: 80.0 - 100.0 fL  64.2 (L)   MCH Latest Ref Range: 26.0 - 34.0 pg  23.3 (L)   MCHC Latest Ref Range: 31.0 - 37.0 g/dL  81.1   RDW Latest Ref Range: 12.0 - 15.0 %  19.5 (H)   MPV Latest Ref Range: 7.0 - 10.0 fL  7.1   Platelet Latest Ref Range: 150 - 440 10*9/L  414   Neutrophils % Latest Units: %  80.9   Lymphocytes % Latest Units: %  7.5   Monocytes % Latest Units: %  9.0   Eosinophils % Latest Units: %  0.6   Basophils % Latest Units: %  0.3   Absolute Neutrophils Latest Ref Range: 2.0 - 7.5 10*9/L  7.0   Absolute Lymphocytes Latest Ref Range: 1.5 - 5.0 10*9/L  0.7 (L)   Absolute Monocytes  Latest Ref Range: 0.2 - 0.8 10*9/L  0.8   Absolute Eosinophils Latest Ref Range: 0.0 - 0.4 10*9/L  0.1   Absolute Basophils  Latest Ref Range: 0.0 - 0.1 10*9/L  0.0   Microcytosis Latest Ref Range: Not Present   Marked (A)  Anisocytosis Latest Ref Range: Not Present   Moderate (A)   Hyperchromasia Latest Ref Range: Not Present   Marked (A)   Smear Review Unknown  See Comment   Poikilocytosis Latest Ref Range: Not Present   Moderate (A)   Target Cells Latest Ref Range: Not Present   Moderate (A)   Large Unstained Cells Latest Ref Range: 0 - 4 %  2   Neutrophil Left Shift Latest Ref Range: Not Present   1+ (A)   Variable Hemoglobin Concentration Latest Ref Range: Not Present   Slight (A)   Sodium Latest Ref Range: 135 - 145 mmol/L 135    Potassium Latest Ref Range: 3.5 - 5.0 mmol/L 3.9    Chloride Latest Ref Range: 98 - 107 mmol/L 100    CO2 Latest Ref Range: 22.0 - 30.0 mmol/L 28.0    Bun Latest Ref Range: 7 - 21 mg/dL 22 (H)    Creatinine Latest Ref Range: 0.60 - 1.00 mg/dL 1.61    BUN/Creatinine Ratio Unknown 24    EGFR CKD-EPI African American, Female Latest Ref Range: >=60 mL/min/1.104m2 67    EGFR CKD-EPI Non-African American, Female Latest Ref Range: >=60 mL/min/1.71m2 58 (L)    Anion Gap Latest Ref Range: 9 - 15 mmol/L 7 (L)    Glucose Latest Ref Range: 65 - 179 mg/dL 096 (H)    Calcium Latest Ref Range: 8.5 - 10.2 mg/dL 8.5    Albumin Latest Ref Range: 3.5 - 5.0 g/dL 2.2 (L)    Total Protein Latest Ref Range: 6.5 - 8.3 g/dL 5.4 (L)    Total Bilirubin Latest Ref Range: 0.0 - 1.2 mg/dL 0.2    AST Latest Ref Range: 14 - 38 U/L 17    ALT Latest Ref Range: 15 - 48 U/L 38    Alkaline Phosphatase Latest Ref Range: 38 - 126 U/L 153 (H)    Lipase Latest Ref Range: 44 - 232 U/L 25 (L)    Troponin I Latest Ref Range: <0.060 ng/mL <0.060        RADIOLOGY:  CT CHEST:  Interval continued mild decrease in size of bilateral upper lobe pulmonary masses, compatible with treatment response.  Interval increased moderate bilateral pleural effusions, with chest tubes in place.  Interval progression of vertebral plana of T12 vertebral body likely secondary to metastasis.    PATHOLOGY:  Final Diagnosis 05/19/17   A: Liver, lesion, needle core biopsy   - Metastatic poorly differentiated carcinoma, consistent with adenocarcinoma of lung origin (see ancillary studies)     NOTE: PDL-1 IHC on liver biopsy 04%    STRATA NGS Liver 05/31/17  MET exon 14  RB1 p.[id number]  TP53 p.[id number]  MSS  TMB ??? Low; Mutations per MB: 7  PD-L1 ??? High; RNA expression score: 100      ASSESSMENT AND PLAN:  65 yoF with Stage IV NSCLC-adenocarcinoma, mets to liver, met del 14, on crizotinib.   Laboratory results were reviewed and found to be acceptable.  Images and imaging reports were reviewed and show SD.  Cont crizotinib.  64m FU imaging ordered.  Given absence of SOB, will monitor pericardial effusion; in case of dyspnea, will get TTE.  The patients questions were answered to her satisfaction.

## 2017-11-21 NOTE — Unmapped (Signed)
Labs drawn and sent for analysis.  Care provided by  Y Cheek.

## 2017-11-21 NOTE — Unmapped (Signed)
OUTPATIENT ONCOLOGY PALLIATIVE CARE    Principal Diagnosis: [name] [name] is a 84 y.o. female with stage 4 NSCLC,  diagnosed in 2019.  Disease sites include brain, liver, s/p bilateral pleurx.     Assessment/Plan:   1.  Status post fall: Patient had a mechanical fall a few weeks ago.  She is now exhibiting fear of falling which puts her at risk of additional deconditioning.    Plan:  ??? Home physical therapy referral will be placed after discussion with patient and her son.    2. Fatigue: Persistent, most likely associated with malignancy, currently is not significantly impacting patient's daily function which is more affected by her fear of falling.  Her episodes of hypoglycemia have not recurred per son.  Will monitor at this point.    3.  Lung cancer: Patient appears to be tolerating crizotinib.  She is also taking Eliquis for A. fib.    3.  Advanced care planning: Patient son demonstrates prognostic awareness that her cancer is incurable.  Her scans do not demonstrate significant treatment effect but will clarify with oncology.  She appears frail at this visit and concerned about her risk of both physical and functional decline in the coming weeks/months.    -Patient has follow-up visit today with Dr. Janann August to discuss CT scan results.    F/u: 1 month, in conjunction with next oncology visit.    ----------------------------------------  Referring Provider: Eloise Harman, MD  Oncology Team: Thoracic  PCP: [name] Lodge, MD      HPI: 84 year old woman with metastatic non-small cell lung cancer diagnosed 6 months ago (liver, pleura, bone) who was seen for initial visit.  Her son [name] [name] was also present.  Patient lives in [place] with her 22 year old grandson.  She has custody.  She has 24/7 support from family members, including her son [name] [name] who works in IT and is in her home during the day and her daughter-in-law who stays with her at night.  Patient herself does some cleaning and like cooking.  She has stopped doing laundry.  Her son states that she has days where she does not feel up to being active.  On other days she estimates 3-4 times a week she is talkative and active.  She will express interest in going shopping for example.  He describes her overall is being lethargic and sleeping more.  Her past medical history includes diabetes.  Her son states that he has been decreasing her insulin dose and consultation with her primary physician.  He does note that she has had fluctuating fingerstick readings from 50 past week to over 500.    Patient denies pain or discomfort and shortness of breath.  Her son assists with draining her bilateral Pleurx catheters every other day and states that approximately 450 cc all removed.  He states that previously he had only been removing 100 cc.  Her appetite is good at times and she has gained 20 pounds after having lost weight in the context of her cancer diagnosis.    Patient states that she does not let cancer bother her.  She denies having any worries.  She will gain strength and seeing the rest of her family doing well.  She states believing in G-d and attending church sometimes.  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    Patient states that she did not know that she had cancer prior to diagnosis.  Her son states understanding that the goal is to shrink the cancer  as it is not curable than treatable.  He states being informed that the cancer has decreased in size and considerable amount and does note hope that it will may be disappear.    Patient states that her bowel function is improved and she denies constipation.  She also denies nausea and vomiting.    Patient states preference for her son [name] [name] to service her healthcare power of attorney.  She states that he knows her best.  She prefers for other family members not to have information about her condition.  [name] [name] states believes that patient would want to be at home if she were dying and not in a hospice facility. Believes that she would be willing to receive hospital care if she had to be treated.    Current cancer-directed therapy: crizotinib    Interval history, 11/21/17, GW:  Follow up. Son [name] [name] present and provided most of history of recent events.  - Patient had a fall at home few weeks ago.  Fall was witnessed by her son.  She tripped on a rug with her walker.  The knee took the brunt of her fall.  However, she also sustained a laceration to her right scalp and stitches were placed.  Patient did not lose consciousness.  Her son states that she has been more cautious in recent days and overall less active.  He thinks that she has concerns about falling again.  ??? son states that patient's oral intake is about the same.  Overall her energy level has decreased.  When she does get up, she is able to ambulate.  She has not been out shopping.  ??? Son states the patient has not had any fingerstick levels less than 100.  He states that her general fingerstick level is between 200-250.  He has decreased her insulin dose.  ??? Son reports that patient's leg and ankle swelling are under better control.  ???Patient denies pain or shortness of breath.  She states that she feels pretty good.  She spends most of her day in her home.  ??? Son continues to drain both Pleurx catheters every other day.  The left side tends to drain 250 cc in the right side 450 cc.  ??? Son reports that patient has some tremulousness after she first bears weight.  However, this resolves at most after a few minutes.    Palliative Performance Scale: 50% - Ambulation: Mainly sit/lie / Unable to do any work, extensive disease / Self-Care:Considerable assistance required, Intake: Normal or reduced / Level of Conscious: Full or confusion    Coping/Support Issues: feels well supported by her family    Goals of Care: Currently receiving cancer directed treatment.    Social History:   Name of primary support son [name] [name]:   Occupation:  Hobbies:  Current residence / distance from [facility]: [employer]    Advance Care Planning: Expressive preference for her son [name] [name] to serve as her Social research officer, government.  HCPOA:  Natural surrogate decision maker:  Living Will:  ACP note:     Objective     Oncology History    - 04/30/17: Presented to ED with dyspnea for over 2 weeks.  CTA with PEs, b/l pleural effusions, mediastinal soft tissue mass 9/5/7cm, invading LUL nd 8.2cm lesion inleft hepatic lobe  - 05/17/17: b/l thoracentesis with 2L removed each lung.  Cytology with no malignant cells but lymphocyte predominant exudative effusiions  - 05/18/17: Biopsy liver lesion c/w lung adenocarcinoma  Patient Active Problem List   Diagnosis   ??? SOB (shortness of breath)   ??? Hypoxemia   ??? Acute pulmonary embolism (CMS-HCC)   ??? HTN (hypertension)   ??? Diabetes mellitus, type 2 (CMS-HCC)   ??? Metastatic lung cancer (metastasis from lung to other site), unspecified laterality (CMS-HCC)   ??? Addison anemia   ??? Lipoma of back   ??? Weight loss   ??? Lump of breast, right   ??? Pernicious anemia   ??? Weight loss, abnormal   ??? Anemia   ??? Need for shingles vaccine   ??? Pleural effusion   ??? Chest pain on breathing   ??? T12 compression fracture (CMS-HCC)   ??? Dyspepsia   ??? Thyroid nodule   ??? Hematuria       Past Medical History:   Diagnosis Date   ??? Atrial fibrillation (CMS-HCC)    ??? Cancer (CMS-HCC)    ??? Diabetes mellitus (CMS-HCC)    ??? Hypertension    ??? Metastatic lung cancer (metastasis from lung to other site) (CMS-HCC)    ??? Pulmonary embolism (CMS-HCC)        Past Surgical History:   Procedure Laterality Date   ??? CHG RAD GUIDED,PERCUT DRAINAGE,W/CATH PLACE Right 06/19/2017    Procedure: RADIOLOGICAL GUIDANCE, FOR PERCUTANEOUS DRAINAGE, WITH PLACEMENT OF CATHETER, RAD S&I;  Surgeon: Sherwood Gambler, MD;  Location: BRON[facility] PROCEDURE LAB [facility];  Service: Pulmonary   ??? CHOLECYSTECTOMY     ??? HYSTERECTOMY     ??? neck surgey     ??? PR INSERTION INDWELLING TUNNELED PLEURAL CATHETER Bilateral 06/19/2017    Procedure: INSERTION OF INDWELLING TUNNELED PLEURAL CATHETER WITH CUFF WITH MODERATE SEDATION;  Surgeon: Sherwood Gambler, MD;  Location: BRONCH PROCEDURE LAB [facility];  Service: Pulmonary   ??? PR INSERTION INDWELLING TUNNELED PLEURAL CATHETER Left 10/26/2017    Procedure: INSERTION OF INDWELLING TUNNELED PLEURAL CATHETER WITH CUFF;  Surgeon: Mercy Moore, MD;  Location: MAIN OR [facility];  Service: Pulmonary   ??? PR MOD SED SAME PHYS/QHP EACH ADDL 15 MINS  06/19/2017    Procedure: MODERATE SEDATION SERVICES PROVIDED BY SAME PHYSICIAN OR OTHER QUALIFIED HEALTH CARE PROFESSIONAL PERFORMING THE DIAGNOSTIC OR THERAPEUTIC SERVICE THAT SEDATION SUPPORTS, EACH ADDITIONAL 15 MINS;  Surgeon: Sherwood Gambler, MD;  Location: BRONCH PROCEDURE LAB [facility];  Service: Pulmonary   ??? PR MOD SED SAME PHYS/QHP INITIAL 15 MINS 5/> YRS  06/19/2017    Procedure: MODERATE SEDATION SERVICES PROVIDED BY SAME PHYSICIAN OR OTHER QUALIFIED HC PROFESSIONAL PERFORMING THE DIAGNOSTIC OR THERAPEUTIC SERVICE THAT SEDATION SUPPORTS, INIT 15 MINS, PT AGE 87 YEARS OR OLDER;  Surgeon: Sherwood Gambler, MD;  Location: BRONCH PROCEDURE LAB [facility];  Service: Pulmonary       Current Outpatient Medications   Medication Sig Dispense Refill   ??? blood sugar diagnostic (ACCU-CHEK AVIVA) Strp      ??? calcium carbonate-vitamin D3 (CALTRATE 600 + D) 600 mg (1,500 mg)-800 unit Chew Chew 1 tablet Two (2) times a day.     ??? crizotinib (XALKORI) 250 mg capsule Take 1 capsule (250 mg total) by mouth Two (2) times a day. 60 capsule 3   ??? furosemide (LASIX) 20 MG tablet Take 20 mg by mouth daily as needed for swelling.     ??? insulin aspart prot/insuln asp (NOVOLOG MIX 70-30 FLEXPEN SUBQ) Inject 9 Units under the skin Two (2) times a day (30 minutes before a meal).     ??? lidocaine 4 % patch Place 1 patch  on the skin daily.     ??? losartan (COZAAR) 100 MG tablet Take 1 tablet (100 mg total) by mouth daily. 90 tablet 3   ??? metFORMIN (GLUCOPHAGE-XR) 500 MG 24 hr tablet TAKE 2 TABLETS (1,000 MG TOTAL) BY MOUTH TWO (2) TIMES A DAY. 360 tablet 3   ??? pen needle, diabetic (BD ULTRA-FINE SHORT PEN NEEDLE) 31 gauge x 5/16 Ndle USE AS DIRECTED. USE 4 NEEDLES A DAY WITH DIABETIC MEDICATION PENS AS DIRECTED     ??? potassium chloride SA (K-DUR,KLOR-CON) 20 MEQ tablet Take 1 tablet (20 mEq total) by mouth daily. 90 tablet 1   ??? ranitidine (ZANTAC) 150 MG tablet Take 1 tablet (150 mg total) by mouth Two (2) times a day. 180 tablet 3   ??? simethicone (GAS-X) 80 MG chewable tablet Chew 1 tablet (80 mg total) 4 (four) times a day as needed for flatulence (gas relief). 100 tablet 2     No current facility-administered medications for this visit.        Allergies:   Allergies   Allergen Reactions   ??? Sulfasalazine Nausea Only     Family History:  Cancer-related family history includes Cancer in her brother, daughter, and daughter.  She indicated that her brother is deceased. She indicated that only one of her two daughters is alive.    REVIEW OF SYSTEMS:  A comprehensive review of 14 systems was negative except for pertinent positives noted in HPI.      PHYSICAL EXAM:   Vital signs for this encounter: VS reviewed in EPIC.  GEN: frail appearing older woman sitting up  PSYCH: Alert and oriented to person, place and time. Euthymic.  HEENT: Pupils equally round without scleral icterus. No facial asymmetry. Dressing on right scalp  CV: Regular rhythm with normal rate, no murmurs.  LUNGS: No increased work of breathing.  ABD: Soft and non-tender with no distention  SKIN: No rashes, petechiae or jaundice noted  EXT: 1+ pitting edema of both lower extremities  NEURO: Normal gait and coordination. attentive    Lab Results   Component Value Date    CREATININE 0.95 11/21/2017     Lab Results   Component Value Date    ALKPHOS 129 (H) 11/21/2017    BILITOT 0.4 11/21/2017    BILIDIR 0.20 07/11/2017    PROT 4.6 (L) 11/21/2017    ALBUMIN 1.8 (L) 11/21/2017    ALT 42 11/21/2017    AST 23 11/21/2017        I personally spent over half of a total 30 minutes in counseling and discussion with the patient as described above.    Doreatha Martin, MD  [facility] On[facility] Palliative Care

## 2017-11-21 NOTE — Unmapped (Signed)
Assessment:     Patient:[name] [name] ([birth year])  Reason for visit: [name] [name] is a 84 y.o.female who returns for bilateral maligant effusion s/p bilaterl PleurX. Left pleurx had become dislodged with cuff outside of the skin and significant leaking.  Removed pleurx in clinic and sutured wound closed on 8/27 and a new  Left Pleurx on 8/29.      Plan:     1. Continue to drain the bilateral pleurx every other day up to a liter.    Patient was seen and examined w Dr. Curt Bears, recommendations made in agreement.    [name] Gutting, MD  Pulmonary & Critical Care Fellow  November 21, 2017  Pager [phone]      Subjective:   HPI: [name] [name] returns for pleurx management.      Draining every other day. The right tube is draining every other day nearly each time, yellow fluid.  The left is draining every other day about ; the amount fo fluid drained on the L went up slightly after it was replaced, then it came down to ~118ml, now it is steady at 200-276ml every other day. The fluid is serous on the left, slightly lighter on the right.  No purulence.  No fevers, chills, redness or pain at either site. Has not noticed much improvement in [name] [name] breathing after draining.      Review of Systems  A 12 point review of systems was negative except for pertinent items noted in the HPI.    Past Medical History:   Diagnosis Date   ??? Atrial fibrillation (CMS-HCC)    ??? Cancer (CMS-HCC)    ??? Diabetes mellitus (CMS-HCC)    ??? Hypertension    ??? Metastatic lung cancer (metastasis from lung to other site) (CMS-HCC)    ??? Pulmonary embolism (CMS-HCC)        Past Surgical History:   Procedure Laterality Date   ??? CHG RAD GUIDED,PERCUT DRAINAGE,W/CATH PLACE Right 06/19/2017    Procedure: RADIOLOGICAL GUIDANCE, FOR PERCUTANEOUS DRAINAGE, WITH PLACEMENT OF CATHETER, RAD S&I;  Surgeon: Sherwood Gambler, MD;  Location: BRONCH PROCEDURE LAB [facility];  Service: Pulmonary   ??? CHOLECYSTECTOMY     ??? HYSTERECTOMY     ??? neck surgey ??? PR INSERTION INDWELLING TUNNELED PLEURAL CATHETER Bilateral 06/19/2017    Procedure: INSERTION OF INDWELLING TUNNELED PLEURAL CATHETER WITH CUFF WITH MODERATE SEDATION;  Surgeon: Sherwood Gambler, MD;  Location: BRONCH PROCEDURE LAB [facility];  Service: Pulmonary   ??? PR INSERTION INDWELLING TUNNELED PLEURAL CATHETER Left 10/26/2017    Procedure: INSERTION OF INDWELLING TUNNELED PLEURAL CATHETER WITH CUFF;  Surgeon: Mercy Moore, MD;  Location: MAIN OR [facility];  Service: Pulmonary   ??? PR MOD SED SAME PHYS/QHP EACH ADDL 15 MINS  06/19/2017    Procedure: MODERATE SEDATION SERVICES PROVIDED BY SAME PHYSICIAN OR OTHER QUALIFIED HEALTH CARE PROFESSIONAL PERFORMING THE DIAGNOSTIC OR THERAPEUTIC SERVICE THAT SEDATION SUPPORTS, EACH ADDITIONAL 15 MINS;  Surgeon: Sherwood Gambler, MD;  Location: BRONCH PROCEDURE LAB [facility];  Service: Pulmonary   ??? PR MOD SED SAME PHYS/QHP INITIAL 15 MINS 5/> YRS  06/19/2017    Procedure: MODERATE SEDATION SERVICES PROVIDED BY SAME PHYSICIAN OR OTHER QUALIFIED HC PROFESSIONAL PERFORMING THE DIAGNOSTIC OR THERAPEUTIC SERVICE THAT SEDATION SUPPORTS, INIT 15 MINS, PT AGE 29 YEARS OR OLDER;  Surgeon: Sherwood Gambler, MD;  Location: BRON[facility] PROCEDURE LAB [facility];  Service: Pulmonary       Current Outpatient Medications   Medication Sig Dispense Refill   ???  blood sugar diagnostic (ACCU-CHEK AVIVA) Strp      ??? calcium carbonate-vitamin D3 (CALTRATE 600 + D) 600 mg (1,500 mg)-800 unit Chew Chew 1 tablet Two (2) times a day.     ??? crizotinib (XALKORI) 250 mg capsule Take 1 capsule (250 mg total) by mouth Two (2) times a day. 60 capsule 3   ??? furosemide (LASIX) 20 MG tablet Take 20 mg by mouth daily as needed for swelling.     ??? insulin aspart prot/insuln asp (NOVOLOG MIX 70-30 FLEXPEN SUBQ) Inject 9 Units under the skin Two (2) times a day (30 minutes before a meal).     ??? lidocaine 4 % patch Place 1 patch on the skin daily.     ??? losartan (COZAAR) 100 MG tablet Take 1 tablet (100 mg total) by mouth daily. 90 tablet 3   ??? metFORMIN (GLUCOPHAGE-XR) 500 MG 24 hr tablet TAKE 2 TABLETS (1,000 MG TOTAL) BY MOUTH TWO (2) TIMES A DAY. 360 tablet 3   ??? pen needle, diabetic (BD ULTRA-FINE SHORT PEN NEEDLE) 31 gauge x 5/16 Ndle USE AS DIRECTED. USE 4 NEEDLES A DAY WITH DIABETIC MEDICATION PENS AS DIRECTED     ??? potassium chloride SA (K-DUR,KLOR-CON) 20 MEQ tablet Take 1 tablet (20 mEq total) by mouth daily. 90 tablet 1   ??? ranitidine (ZANTAC) 150 MG tablet Take 1 tablet (150 mg total) by mouth Two (2) times a day. 180 tablet 3   ??? simethicone (GAS-X) 80 MG chewable tablet Chew 1 tablet (80 mg total) 4 (four) times a day as needed for flatulence (gas relief). 100 tablet 2     No current facility-administered medications for this visit.        Allergies as of 11/21/2017 - Reviewed 11/21/2017   Allergen Reaction Noted   ??? Sulfasalazine Nausea Only 12/22/2014       Family History   Problem Relation Age of Onset   ??? Cancer Brother         lung   ??? Cancer Daughter         lung   ??? Cancer Daughter         breast       Social History     Socioeconomic History   ??? Marital status: Widowed     Spouse name: Not on file   ??? Number of children: Not on file   ??? Years of education: Not on file   ??? Highest education level: Not on file   Occupational History   ??? Not on file   Social Needs   ??? Financial resource strain: Not on file   ??? Food insecurity:     Worry: Not on file     Inability: Not on file   ??? Transportation needs:     Medical: Not on file     Non-medical: Not on file   Tobacco Use   ??? Smoking status: Never Smoker   ??? Smokeless tobacco: Never Used   Substance and Sexual Activity   ??? Alcohol use: No   ??? Drug use: Never   ??? Sexual activity: Not on file   Lifestyle   ??? Physical activity:     Days per week: Not on file     Minutes per session: Not on file   ??? Stress: Not on file   Relationships   ??? Social connections:     Talks on phone: Not on file     Gets together: Not on file  Attends religious service: Not on file Active member of club or organization: Not on file     Attends meetings of clubs or organizations: Not on file     Relationship status: Not on file   Other Topics Concern   ??? Do you use sunscreen? No   ??? Tanning bed use? No   ??? Are you easily burned? No   ??? Excessive sun exposure? No   ??? Blistering sunburns? No   Social History Narrative   ??? Not on file       Objective:   GENERAL: Chronically ill-looking, in no distress  HEENT: Normocephalic, atraumatic. EOMI. PERRL.  CHEST: No chest wall deformities, normal chest excursion.  LUNGS: Clear to auscultation bilaterally with no wheezing. Bilateral PleurX catethers are in place, no purulent drainage at the entry site; no tenderness or erythema.  HEART: Normal rate and rhythm.  Without S4, normal S1 and S2. No murmurs.  MUSCULOSKELETAL: No joint abnormalities  EXTREMITIES: Warm and well-perfused. + clubbing, 1+ pitting edema around the ankles noted (moreso on the L).  NEURO: CNII-XII grossly intact, no dysarthria/aphasia. Moves all extremities spontaneously against gravity.  SKIN: No rashes, bruising or other lesions.           Diagnostic Review:     I have personally reviewed all of the diagnostic information associated with this encounter.

## 2017-11-22 NOTE — Unmapped (Signed)
Home based PT referral sent to  [facility], [place]  800 [phone]  Fax [phone]

## 2017-11-23 NOTE — Unmapped (Signed)
Addended byCurt Bears, [place] on: 11/23/2017 10:26 AM     Modules accepted: Level of Service

## 2017-11-28 ENCOUNTER — Ambulatory Visit: Admit: 2017-11-28 | Discharge: 2017-11-29 | Payer: MEDICARE | Attending: Family Medicine | Primary: Family Medicine

## 2017-11-28 DIAGNOSIS — I10 Essential (primary) hypertension: Principal | ICD-10-CM

## 2017-11-28 DIAGNOSIS — Z4802 Encounter for removal of sutures: Secondary | ICD-10-CM

## 2017-11-28 DIAGNOSIS — E119 Type 2 diabetes mellitus without complications: Secondary | ICD-10-CM

## 2017-11-28 DIAGNOSIS — L89151 Pressure ulcer of sacral region, stage 1: Secondary | ICD-10-CM

## 2017-11-28 DIAGNOSIS — Z794 Long term (current) use of insulin: Secondary | ICD-10-CM

## 2017-11-28 DIAGNOSIS — Z23 Encounter for immunization: Secondary | ICD-10-CM | POA: Diagnosis not present

## 2017-11-28 MED ORDER — INSULIN GLARGINE (U-100) 100 UNIT/ML (3 ML) SUBCUTANEOUS PEN
Freq: Every evening | SUBCUTANEOUS | 3 refills | 0.00000 days | Status: CP
Start: 2017-11-28 — End: 2017-12-20

## 2017-11-28 NOTE — Unmapped (Addendum)
Excoriation vs decubitus ulcer  -Advised son to make sure pt is not laying in one position for extended periods of time  - Discussed wound care  -Provided patient information

## 2017-11-28 NOTE — Unmapped (Addendum)
Soft pressures, pt's son reports fatigue which is likely multifactorial  DECREASE losartan from 100mg  to 50mg  daily   Continue lasix prn and kdur  Will continue to decrease as tolerated    BP Readings from Last 3 Encounters:   11/28/17 108/66   11/21/17 90/53   11/08/17 107/56

## 2017-11-28 NOTE — Unmapped (Addendum)
Patient Education        Pressure Injuries: Care Instructions  Your Care Instructions    A pressure injury on the skin is caused by constant pressure to that area. These injuries???also called decubitus ulcers or bedsores???may happen when you lie in bed or sit in a wheelchair for a long time. The constant pressure blocks the blood supply to the skin. This causes skin cells to die and creates a sore. Pressure injuries usually occur over bony areas, such as the hips, lower back, elbows, heels, and shoulders. They also can occur in places where the skin folds over on itself. You may have mild redness or open sores that are harder to heal.  Good care at home can help heal pressure injuries. You or your caregiver needs to check your skin every day for sores. You need good nutrition and plenty of fluids to keep your skin healthy and prevent new pressure injuries.  Follow-up care is a key part of your treatment and safety. Be sure to make and go to all appointments, and call your doctor if you are having problems. It's also a good idea to know your test results and keep a list of the medicines you take.  How can you care for yourself at home?  ?? If your doctor prescribed a medicated ointment or cream, use it exactly as prescribed. Call your doctor if you think you are having a problem with your medicine.  ?? Wash pressure injuries every day, or as often as your doctor recommends. Most tap water is safe, but follow the advice of your doctor or nurse. He or she may recommend that you use a saline solution. This is a salt and water solution that you can buy over the counter.  ?? Put on bandages as your doctor or wound care specialist says.  ?? Keep healthy tissue around the sore clean and dry.  ?? Check your skin every day for sores (or have a caregiver do it).  ?? If you know what is causing the pressure that caused the sore, find a way to remove that pressure.  To prevent pressure injuries  ?? Change your position or have your caregiver help you change your position often. You may need to do this every 2 hours if you are in bed or every 15 minutes if you are in a wheelchair. This lowers the chance of making sores worse and getting new sores.  ?? Use special mattresses or other support. These may include low-pressure mattresses or cushions made of foam that can be filled with air, water, beads, or fiber.  ?? Eat healthy foods with plenty of protein to help heal damaged skin and to help new skin grow.  ?? Try to stay at a healthy weight. Being overweight can lead to more pressure on your skin.  ?? Do not slide across sheets or slump in a chair or bed.  ?? Do not smoke. Smoking dries the skin and reduces its blood supply. If you need help quitting, talk to your doctor about stop-smoking programs and medicines. These can increase your chances of quitting for good.  When should you call for help?  Call your doctor now or seek immediate medical care if:  ?? ?? You have signs of infection, such as:  ? Increased pain, swelling, warmth, or redness.  ? Red streaks leading from the sore.  ? Pus draining from the sore.  ? A fever.   ??Watch closely for changes in your health, and  be sure to contact your doctor if:  ?? ?? Your pressure injuries are not healing.   ?? ?? You have new pressure injuries.   ?? ?? You need help changing positions in bed or in a chair.   ?? ?? Your caregiver needs help to move you.   Where can you learn more?  Go to [facility] at [email or web address]  [facility] under the Resources menu. Enter F114 in the search box to learn more about Pressure Injuries: Care Instructions.  Current as of: November 23, 2016  Content Version: 12.2  ?? 2006-2019 [employer]. Care instructions adapted under license by [facility]. If you have questions about a medical condition or this instruction, always ask your healthcare professional. [employer] disclaims any warranty or liability for your use of this information.

## 2017-11-28 NOTE — Unmapped (Signed)
Forehead sutures removed  RTC in 2 weeks for L knee suture removal

## 2017-11-28 NOTE — Unmapped (Addendum)
Per home BGs, AM values in 100s and PM values in 200-500s. Son was adjusting insulin dosage, unsure how adjusted dosage matches with home BGs.   STOP Novolog 70-30 9u BID  START Lantus 10u qhs, continue qachs bg checks.  CONTINUE metformin xr 500mg  BID d/t GI distress.  Urged son not to adjust medication dose and let me know if pt has SE or hypoglycemic episodes    Lab Results   Component Value Date    A1C 8.1 (A) 09/26/2017    A1C 9.6 (A) 08/22/2017    A1C 10.3 (A) 08/01/2017    A1C 8.1 (H) 05/19/2017

## 2017-11-28 NOTE — Unmapped (Signed)
Assessment and Plan  [name] [name] is a 84 y.o. female with a PMH as above who presents to the clinic for the following:    Problem List Items Addressed This Visit     HTN (hypertension) - Primary     Soft pressures, pt's son reports fatigue which is likely multifactorial  DECREASE losartan from 100mg  to 50mg  daily   Continue lasix prn and kdur  Will continue to decrease as tolerated    BP Readings from Last 3 Encounters:   11/28/17 108/66   11/21/17 90/53   11/08/17 107/56            Relevant Medications    losartan (COZAAR) 50 MG tablet    Diabetes mellitus, type 2 (CMS-HCC)     Per home BGs, AM values in 100s and PM values in 200-500s. Son was adjusting insulin dosage, unsure how adjusted dosage matches with home BGs.   STOP Novolog 70-30 9u BID  START Lantus 10u qhs, continue qachs bg checks.  CONTINUE metformin xr 500mg  BID d/t GI distress.  Urged son not to adjust medication dose and let me know if pt has SE or hypoglycemic episodes    Lab Results   Component Value Date    A1C 8.1 (A) 09/26/2017    A1C 9.6 (A) 08/22/2017    A1C 10.3 (A) 08/01/2017    A1C 8.1 (H) 05/19/2017             Relevant Medications    insulin glargine (BASAGLAR, LANTUS) 100 unit/mL (3 mL) injection pen    Pressure injury of sacral region, stage 1     Excoriation vs decubitus ulcer  -Advised son to make sure pt is not laying in one position for extended periods of time  - Discussed wound care  -Provided patient information         Visit for suture removal     Forehead sutures removed  RTC in 2 weeks for L knee suture removal               Return in about 2 weeks (around 12/12/2017), or if symptoms worsen or fail to improve, for DM2, and suture removal (L knee).    Chief Complaint  Chief Complaint   Patient presents with   ??? Chronic Condition Follow-Up   ??? Suture / Staple Removal       HPI  [name] [name] is a 84 y.o. female with a PMH as below who presents for the following:    Fall  Seen by ED on 11/08/2017 for fall. Pts son states that pt was using a walker and fell forward. Pts son states that the sutures on left knee broke and he put butterfly bandages over the wound.     HTN  Currently taking losartan 100mg , lasix 20mg  prn and potassium supplement.   - BP continues to be soft. On previous visit we stopped amlodipine 5mg  which has not helped.  BP Readings from Last 3 Encounters:   11/28/17 108/66   11/21/17 90/53   11/08/17 107/56     DM2  - Pts was previously followed by endo at [facility].  - On last visit, novolog 70-30 was increased from 10u BID to 9u BID. Today son reports he has been adjusting pts novolog to 5-6u BID.   - Also taking metformin xr 500mg  BID. Was on 1000mg  BID which her son decreased to 500mg  BID. Son states that pt had stomach pain last week so he temporarily stopped  metformin.  -Per home BGs, AM values in 100s and PM values in 200-500s.    Lab Results   Component Value Date    A1C 8.1 (A) 09/26/2017    A1C 9.6 (A) 08/22/2017    A1C 10.3 (A) 08/01/2017    A1C 8.1 (H) 05/19/2017      Son reports hematuria after wiping urine. Has noticed pink urine but none in the toilet bowl. No vaginal or GI bleeding. Denies fever, chills, abdominal/flank/pelvic pain, dysuria, vaginal itching/lesions.    Last OV  Chest pain  Pt reports chest pain from ED visit has resolved.     Thoracic compression fracture  Pt reports having occassional back pain.She currently takes 1000mg  otc Tylenol and Salonpas for pain relief.     Acute pulmonary embolism  Emboli in right upper lobe segmental and subsegmental arteries 04/30/17, taking apixaban BID.     Metastatic lung cancer  Left upper lung 10cm mass found 04/30/17 with 2.7cm right upper lobe nodule. With mediastinal lymphadenopathy and hepatic lobe mass. Liver lesion biopsied 3/21 confirmed adenocarcinoma of lung.   - upcoming f/u for pleural effusion. Stop eliquis as recommend.  - No cardiopulm complaints - admission sxs of CP/DOE have resolved    Anemia  Son reports pt has hx of anemia; no iron studies on file.     Past Medical History:   Diagnosis Date   ??? Atrial fibrillation (CMS-HCC)    ??? Cancer (CMS-HCC)    ??? Diabetes mellitus (CMS-HCC)    ??? Hypertension    ??? Metastatic lung cancer (metastasis from lung to other site) (CMS-HCC)    ??? Pulmonary embolism (CMS-HCC)      Current Outpatient Medications on File Prior to Visit   Medication Sig Dispense Refill   ??? blood sugar diagnostic (ACCU-CHEK AVIVA) Strp      ??? calcium carbonate-vitamin D3 (CALTRATE 600 + D) 600 mg (1,500 mg)-800 unit Chew Chew 1 tablet Two (2) times a day.     ??? crizotinib (XALKORI) 250 mg capsule Take 1 capsule (250 mg total) by mouth Two (2) times a day. 60 capsule 3   ??? furosemide (LASIX) 20 MG tablet Take 20 mg by mouth daily as needed for swelling.     ??? lidocaine 4 % patch Place 1 patch on the skin daily.     ??? losartan (COZAAR) 50 MG tablet Take 1 tablet (50 mg total) by mouth daily. 90 tablet 0   ??? metFORMIN (GLUCOPHAGE-XR) 500 MG 24 hr tablet TAKE 2 TABLETS (1,000 MG TOTAL) BY MOUTH TWO (2) TIMES A DAY. 360 tablet 3   ??? pen needle, diabetic (BD ULTRA-FINE SHORT PEN NEEDLE) 31 gauge x 5/16 Ndle USE AS DIRECTED. USE 4 NEEDLES A DAY WITH DIABETIC MEDICATION PENS AS DIRECTED     ??? potassium chloride SA (K-DUR,KLOR-CON) 20 MEQ tablet Take 1 tablet (20 mEq total) by mouth daily. 90 tablet 1   ??? ranitidine (ZANTAC) 150 MG tablet Take 1 tablet (150 mg total) by mouth Two (2) times a day. 180 tablet 3   ??? simethicone (GAS-X) 80 MG chewable tablet Chew 1 tablet (80 mg total) 4 (four) times a day as needed for flatulence (gas relief). 100 tablet 2   ??? ELIQUIS 5 mg Tab Take 5 mg by mouth Two (2) times a day. TAKE 1 TABLET (5 MG TOTAL) BY MOUTH TWO (2) TIMES A DAY.  3     No current facility-administered medications on file prior to visit.  Social History     Socioeconomic History   ??? Marital status: Widowed     Spouse name: Not on file   ??? Number of children: Not on file   ??? Years of education: Not on file   ??? Highest education level: Not on file   Occupational History   ??? Not on file   Social Needs   ??? Financial resource strain: Not on file   ??? Food insecurity:     Worry: Not on file     Inability: Not on file   ??? Transportation needs:     Medical: Not on file     Non-medical: Not on file   Tobacco Use   ??? Smoking status: Never Smoker   ??? Smokeless tobacco: Never Used   Substance and Sexual Activity   ??? Alcohol use: No   ??? Drug use: Never   ??? Sexual activity: Not on file   Lifestyle   ??? Physical activity:     Days per week: Not on file     Minutes per session: Not on file   ??? Stress: Not on file   Relationships   ??? Social connections:     Talks on phone: Not on file     Gets together: Not on file     Attends religious service: Not on file     Active member of club or organization: Not on file     Attends meetings of clubs or organizations: Not on file     Relationship status: Not on file   Other Topics Concern   ??? Do you use sunscreen? No   ??? Tanning bed use? No   ??? Are you easily burned? No   ??? Excessive sun exposure? No   ??? Blistering sunburns? No   Social History Narrative   ??? Not on file       The following portions of the patient's history were reviewed and updated as appropriate: allergies, current medications, past family history, past medical history, past social history, past surgical history and problem list.    Review of Systems   Comprehensive ROS negative except above    PHYSICAL EXAM  BP 108/66  - Pulse 97  - Temp 36.2 ??C (97.1 ??F) (Oral)  - Resp 16  - Ht 162.6 cm (5' 4.02)  - Wt 60.8 kg (134 lb 1.3 oz)  - BMI 23.00 kg/m??   General appearance: alert, cooperative, NAD   Head: normocephalic and atraumatic  Eyes: normal EOMs, PERRL, normal conjunctiva, no discharge  ENT: OP clear and moist, no tonsillar exudates or erythema, normal TMs and external ear canals, normal nasal passages  Neck: normal ROM, supple, no thyromegaly, no cervical/supraclavicular lymphadenopathy  Lungs: normal work of breathing, good air entry and upper lobes and clear to auscultation bilaterally, decreased bibasilar breath sounds. no wheezes or crackles appreciated   Heart: regular rate and rhythm, S1, S2 normal, no murmur, click, rub or gallop   Abdomen: Abdomen soft, non-tender, non distended. No masses, no organomegaly. Bowel sounds present.  Extremities: extremities normal, atraumatic, no cyanosis or edema  NEURO: AAOx3, appropriately responds to questions, spontaneous movement of extremities  Psych: normal mood and affect    [facility] Components:     Barriers to goals identified and addressed. Pertinent handouts were given today and reviewed with the patient as indicated.  The Care Plan and Self-Management goals have been included on the AVS and the AVS has been printed. Any outside resources or referrals needed  at this time are noted above. Patient's current medications have been reviewed. Any new medications prescribed have been discussed, and side effects have been addressed. Have assessed the patient's understanding, response, and barriers to adherence to medications. Patient voiced understanding and all questions have been answered to satisfaction.     I attest that I, Su Hilt, personally documented this note while acting as scribe for Dr. Jarvis Newcomer B. Allena Katz, MD.   Su Hilt, Scribe  11/28/2017 1:37 PM

## 2017-11-29 DIAGNOSIS — I482 Chronic atrial fibrillation, unspecified: Secondary | ICD-10-CM | POA: Diagnosis not present

## 2017-11-29 DIAGNOSIS — E119 Type 2 diabetes mellitus without complications: Secondary | ICD-10-CM | POA: Diagnosis not present

## 2017-11-29 DIAGNOSIS — Z794 Long term (current) use of insulin: Secondary | ICD-10-CM | POA: Diagnosis not present

## 2017-11-29 DIAGNOSIS — Z9181 History of falling: Secondary | ICD-10-CM | POA: Diagnosis not present

## 2017-11-29 DIAGNOSIS — I10 Essential (primary) hypertension: Secondary | ICD-10-CM | POA: Diagnosis not present

## 2017-11-29 DIAGNOSIS — R530 Neoplastic (malignant) related fatigue: Secondary | ICD-10-CM | POA: Diagnosis not present

## 2017-11-29 DIAGNOSIS — C7931 Secondary malignant neoplasm of brain: Secondary | ICD-10-CM | POA: Diagnosis not present

## 2017-11-29 DIAGNOSIS — W19XXXD Unspecified fall, subsequent encounter: Secondary | ICD-10-CM | POA: Diagnosis not present

## 2017-11-29 DIAGNOSIS — C3412 Malignant neoplasm of upper lobe, left bronchus or lung: Secondary | ICD-10-CM | POA: Diagnosis not present

## 2017-11-29 DIAGNOSIS — Z978 Presence of other specified devices: Secondary | ICD-10-CM | POA: Diagnosis not present

## 2017-11-29 DIAGNOSIS — Z7901 Long term (current) use of anticoagulants: Secondary | ICD-10-CM | POA: Diagnosis not present

## 2017-11-29 DIAGNOSIS — C7951 Secondary malignant neoplasm of bone: Secondary | ICD-10-CM | POA: Diagnosis not present

## 2017-11-29 NOTE — Unmapped (Signed)
Medication discontinued 09/26/17.

## 2017-11-30 ENCOUNTER — Ambulatory Visit: Admit: 2017-11-30 | Discharge: 2017-12-01 | Payer: MEDICARE | Attending: Otolaryngology | Primary: Otolaryngology

## 2017-11-30 DIAGNOSIS — R1310 Dysphagia, unspecified: Principal | ICD-10-CM

## 2017-11-30 NOTE — Unmapped (Signed)
Otolaryngology New Patient Swallowing Consult Visit    Reason for visit:  [name] [name] is seen in consultation at the request of Shaune Leeks [facility]* for the evaluation of dysphasia.    History of Present Illness:  [name] [name] is a 84 y.o. year old female patient with a past medical history positive for atrial fibrillation, lung cancer, diabetes mellitus, hypertension, pulmonary embolism, and a  5 month history of dysphagia. The patient is accompanied by her son who  helps provide medical history.    The patient reports a 4 to 31-month onset of intermittent difficulty swallowing mainly large pills such as her potassium pills reporting sensation of pills and dense dry meats becoming stuck in her mid epigastric region.  Her symptoms begun gradually after beginning chemotherapy after confirm diagnosis of lung carcinoma in April 2019.  During that time she was experiencing dyspnea for which she was evaluated at [facility] emergency department undergoing multiple radiologic imaging studies.  She was found to have pleural effusions bilaterally, mediastinal soft tissue mass, invading the left upper lobe lesion.    She is currently being treated with Xalkori 250 mg capsules daily, which she states she has tolerated without difficulty.  Initially the patient reports experiencing foods becoming stuck sporadically or as her son states coming in spells, the patient during such time was able to call her son on the telephone and speak without difficulty.  She has not experienced such an episode since June 2019.  She reports a 15 pound weight gain within the past few months since starting treatment for her lung cancer which is stabilized between 135-136 pounds.  The patient states when she had the difficulty swallowing, it was at the level of her sternum and was relieved with belching.  She has recently been prescribed ranitidine 150 mg tablets once daily, as well as simethicone 80 mg tablets as needed.  [name] [name] denies any significant change in voice quality.  No difficulty breathing.  No aspiration events.      Symptoms began gradually    Voice complaints include: None  Swallowing symptoms include Solids worse than Liquids and food gettting stuck  Reflux symptoms include difficulty swallowing and dysphagia  Airway symptoms include: None    She has been without any symptoms for the last 3 months.    Past Medical History:  Past Medical History:   Diagnosis Date   ??? Atrial fibrillation (CMS-HCC)    ??? Cancer (CMS-HCC)    ??? Diabetes mellitus (CMS-HCC)    ??? Hypertension    ??? Metastatic lung cancer (metastasis from lung to other site) (CMS-HCC)    ??? Pulmonary embolism (CMS-HCC)        Past Surgical History:  Past Surgical History:   Procedure Laterality Date   ??? CHG RAD GUIDED,PERCUT DRAINAGE,W/CATH PLACE Right 06/19/2017    Procedure: RADIOLOGICAL GUIDANCE, FOR PERCUTANEOUS DRAINAGE, WITH PLACEMENT OF CATHETER, RAD S&I;  Surgeon: Sherwood Gambler, MD;  Location: BRONCH PROCEDURE LAB [facility];  Service: Pulmonary   ??? CHOLECYSTECTOMY     ??? HYSTERECTOMY     ??? neck surgey     ??? PR INSERTION INDWELLING TUNNELED PLEURAL CATHETER Bilateral 06/19/2017    Procedure: INSERTION OF INDWELLING TUNNELED PLEURAL CATHETER WITH CUFF WITH MODERATE SEDATION;  Surgeon: Sherwood Gambler, MD;  Location: BRONCH PROCEDURE LAB [facility];  Service: Pulmonary   ??? PR INSERTION INDWELLING TUNNELED PLEURAL CATHETER Left 10/26/2017    Procedure: INSERTION OF INDWELLING TUNNELED PLEURAL CATHETER WITH CUFF;  Surgeon: Unice Bailey  Salli Real, MD;  Location: MAIN OR [facility];  Service: Pulmonary   ??? PR MOD SED SAME PHYS/QHP EACH ADDL 15 MINS  06/19/2017    Procedure: MODERATE SEDATION SERVICES PROVIDED BY SAME PHYSICIAN OR OTHER QUALIFIED HEALTH CARE PROFESSIONAL PERFORMING THE DIAGNOSTIC OR THERAPEUTIC SERVICE THAT SEDATION SUPPORTS, EACH ADDITIONAL 15 MINS;  Surgeon: Sherwood Gambler, MD;  Location: BRONCH PROCEDURE LAB [facility];  Service: Pulmonary   ??? PR MOD SED SAME PHYS/QHP INITIAL 15 MINS 5/> YRS  06/19/2017    Procedure: MODERATE SEDATION SERVICES PROVIDED BY SAME PHYSICIAN OR OTHER QUALIFIED HC PROFESSIONAL PERFORMING THE DIAGNOSTIC OR THERAPEUTIC SERVICE THAT SEDATION SUPPORTS, INIT 15 MINS, PT AGE 5 YEARS OR OLDER;  Surgeon: Sherwood Gambler, MD;  Location: BRONCH PROCEDURE LAB [facility];  Service: Pulmonary       Medications:    Current Outpatient Medications:   ???  blood sugar diagnostic (ACCU-CHEK AVIVA) Strp, , Disp: , Rfl:   ???  calcium carbonate-vitamin D3 (CALTRATE 600 + D) 600 mg (1,500 mg)-800 unit Chew, Chew 1 tablet Two (2) times a day., Disp: , Rfl:   ???  crizotinib (XALKORI) 250 mg capsule, Take 1 capsule (250 mg total) by mouth Two (2) times a day., Disp: 60 capsule, Rfl: 3  ???  ELIQUIS 5 mg Tab, Take 5 mg by mouth Two (2) times a day. TAKE 1 TABLET (5 MG TOTAL) BY MOUTH TWO (2) TIMES A DAY., Disp: , Rfl: 3  ???  furosemide (LASIX) 20 MG tablet, Take 20 mg by mouth daily as needed for swelling., Disp: , Rfl:   ???  insulin glargine (BASAGLAR, LANTUS) 100 unit/mL (3 mL) injection pen, Inject 0.1 mL (10 Units total) under the skin nightly., Disp: 9 mL, Rfl: 3  ???  lidocaine 4 % patch, Place 1 patch on the skin daily., Disp: , Rfl:   ???  losartan (COZAAR) 50 MG tablet, Take 1 tablet (50 mg total) by mouth daily., Disp: 90 tablet, Rfl: 0  ???  metFORMIN (GLUCOPHAGE-XR) 500 MG 24 hr tablet, TAKE 2 TABLETS (1,000 MG TOTAL) BY MOUTH TWO (2) TIMES A DAY., Disp: 360 tablet, Rfl: 3  ???  pen needle, diabetic (BD ULTRA-FINE SHORT PEN NEEDLE) 31 gauge x 5/16 Ndle, USE AS DIRECTED. USE 4 NEEDLES A DAY WITH DIABETIC MEDICATION PENS AS DIRECTED, Disp: , Rfl:   ???  ranitidine (ZANTAC) 150 MG tablet, Take 1 tablet (150 mg total) by mouth Two (2) times a day., Disp: 180 tablet, Rfl: 3  ???  simethicone (GAS-X) 80 MG chewable tablet, Chew 1 tablet (80 mg total) 4 (four) times a day as needed for flatulence (gas relief)., Disp: 100 tablet, Rfl: 2  ???  potassium chloride SA (K-DUR,KLOR-CON) 20 MEQ tablet, Take 1 tablet (20 mEq total) by mouth daily. (Patient not taking: Reported on 11/30/2017), Disp: 90 tablet, Rfl: 1     Allergies:  Allergies   Allergen Reactions   ??? Sulfasalazine Nausea Only       Family History:  Family History   Problem Relation Age of Onset   ??? Cancer Brother         lung   ??? Cancer Daughter         lung   ??? Cancer Daughter         breast       Social History:  Social History     Tobacco Use   ??? Smoking status: Never Smoker   ??? Smokeless tobacco: Never Used   Substance Use  Topics   ??? Alcohol use: No   ??? Drug use: Never       Review of Systems:      Ears, nose, mouth, throat, and face: positive for Dysphagia  The review of the patient's10 system review of systems had no pertinent positives    Physical Exam:   Constitutional:  Vitals reviewed on nursing chart, patient has normal appearance. Well nourished, well-developed, no acute distress  Voice: without strain, not breathy   Respiration:  Breathing comfortably, no stridor.  CV: No clubbing/cyanosis/edema in hands.   Eyes:  extraocular motion intact, sclera normal.   Neuro:  Alert and oriented times 3, Cranial nerves 2-12 intact and symmetric bilaterally.   Head and Face:  Skin with no masses or lesions, sinuses nontender to palpation, facial nerve fully intact.   Salivary Glands:  Parotid and submandibular glands normal bilaterally.   Ears:  Normal tympanic membranes to otoscopy  Nose:  External nose midline, anterior rhinoscopy is normal with limited visualization just to the anterior interior turbinate.   Oral Cavity/Oropharynx/Lips:  Normal mucous membranes, normal floor of mouth/tongue/oropharynx, no masses or lesions are noted.    Pharyngeal Walls:  No masses noted.  Neck/Lymph:  No lymphadenopathy, no thyroid masses.    Procedure Note 11/30/2017     Endoscopy Type:  Flexible Fiberoptic Laryngoscopy      Indications/TimeOut:  To better evaluate the patient???s symptoms, fiberoptic videostroboscopy is indicated. A time out identifying the patient, the procedure, the location of the procedure and any concerns was performed prior to beginning the procedure.    Procedure Details:    The patient was placed in the sitting position.  After topical anesthesia and decongestion with oxymetazolaine and lidocaine, the flexible laryngoscope was passed.     -  Nasal cavity  - There was no pus or polyps noted in the nasal cavity. Septum intact  -  Nasopharynx - there was no evidence of mass or lesion.    -  Hypopharynx:    Without mucosal lesions    -  Supraglottis:      crisp epiglottis, no lesions    -  Vocal Folds:   Full adduction and abduction    -  Subglottis :      Lesions: none noted    Condition:  Stable.  Patient tolerated procedure well.    Flexible endoscope serial number [id number] used today by Lawrence Marseilles, MD      Complications:  None    Eat-10 = 17    Assessment:   Based upon history and head and neck examination the patient has several salient diagnoses including:    1. LUL SNCLC - adenocarcinoma, undergoing targeted drug therapy  2.  Dysphagia -most likely esophageal by history  3.  Dyspepsia, improving    The patient and her son were reassured that there were no ominous findings on head neck examination, and that of the larynx. Her symptoms may have been due to start of new medication, as they seemed to correlate.    Plan:  1.  Follow-up PRN for increasing subjective dysphagia or dysphonia          This note was created with [employer] and may have errors that were not dictated and not seen in editing.     Elana Alm  Devota Viruet

## 2017-11-30 NOTE — Unmapped (Signed)
Main clinic: [phone]  Office fax #: [phone]    For appointments call [phone] option 2    Nursing questions contact Dr. Atha Starks nurse, Thermon Leyland at [phone].    For surgical scheduling, call Maryann Alar [phone]    Emergency after hours, please call [phone] and ask to speak with the ENT physician on call.

## 2017-12-01 NOTE — Unmapped (Signed)
Flexible endoscope serial number [id number] used today by Lawrence Marseilles, MD

## 2017-12-05 DIAGNOSIS — C3412 Malignant neoplasm of upper lobe, left bronchus or lung: Secondary | ICD-10-CM | POA: Diagnosis not present

## 2017-12-05 DIAGNOSIS — Z9181 History of falling: Secondary | ICD-10-CM | POA: Diagnosis not present

## 2017-12-05 DIAGNOSIS — I10 Essential (primary) hypertension: Secondary | ICD-10-CM | POA: Diagnosis not present

## 2017-12-05 DIAGNOSIS — E119 Type 2 diabetes mellitus without complications: Secondary | ICD-10-CM | POA: Diagnosis not present

## 2017-12-05 DIAGNOSIS — R530 Neoplastic (malignant) related fatigue: Secondary | ICD-10-CM | POA: Diagnosis not present

## 2017-12-05 DIAGNOSIS — I482 Chronic atrial fibrillation, unspecified: Secondary | ICD-10-CM | POA: Diagnosis not present

## 2017-12-05 NOTE — Unmapped (Signed)
[facility] Refill and Clinical Coordination Note  Medication(s): Xalkori 250mg     CHAYA DEHAAN, DOB: [birth year]  Phone: [phone] (home) , Alternate phone contact: N/A  Shipping address: [address]  [place] [address]  Phone or address changes today?: No  All above HIPAA information verified.  Insurance changes? No    Completed refill and clinical call assessment today to schedule patient's medication shipment from the [facility] [phone]).      MEDICATION RECONCILIATION    Confirmed the medication and dosage are correct and have not changed: Yes, regimen is correct and unchanged.    Were there any changes to your medication(s) in the past month:  No, there are no changes reported at this time.    ADHERENCE    Is this medicine transplant or covered by Medicare Part B? No.    Not Applicable    Did you miss any doses in the past 4 weeks? No missed doses reported.  Adherence counseling provided? Not needed     SIDE EFFECT MANAGEMENT    Are you tolerating your medication?:  Nikitia reports tolerating the medication.  Side effect management discussed: None      Therapy is appropriate and should be continued.    Evidence of clinical benefit: See Epic note from 11/21/2017      FINANCIAL/SHIPPING    Delivery Scheduled: Yes, Expected medication delivery date: 12/18/2017     Additional medications refilled: No additional medications/refills needed at this time.    The patient will receive a drug information handout for each medication shipped and additional FDA Medication Guides as required.      Masayo did not have any additional questions at this time.    Delivery address confirmed in Epic.     We will follow up with patient monthly for standard refill processing and delivery.      Thank you,  General Wearing  Anders Grant   [facility] Specialty Pharmacist

## 2017-12-07 DIAGNOSIS — I10 Essential (primary) hypertension: Secondary | ICD-10-CM | POA: Diagnosis not present

## 2017-12-07 DIAGNOSIS — C3412 Malignant neoplasm of upper lobe, left bronchus or lung: Secondary | ICD-10-CM | POA: Diagnosis not present

## 2017-12-07 DIAGNOSIS — I482 Chronic atrial fibrillation, unspecified: Secondary | ICD-10-CM | POA: Diagnosis not present

## 2017-12-07 DIAGNOSIS — Z9181 History of falling: Secondary | ICD-10-CM | POA: Diagnosis not present

## 2017-12-07 DIAGNOSIS — E119 Type 2 diabetes mellitus without complications: Secondary | ICD-10-CM | POA: Diagnosis not present

## 2017-12-07 DIAGNOSIS — R530 Neoplastic (malignant) related fatigue: Secondary | ICD-10-CM | POA: Diagnosis not present

## 2017-12-08 DIAGNOSIS — J910 Malignant pleural effusion: Secondary | ICD-10-CM | POA: Diagnosis not present

## 2017-12-11 ENCOUNTER — Ambulatory Visit: Admit: 2017-12-11 | Discharge: 2017-12-12 | Payer: MEDICARE | Attending: Family Medicine | Primary: Family Medicine

## 2017-12-11 DIAGNOSIS — D630 Anemia in neoplastic disease: Secondary | ICD-10-CM

## 2017-12-11 DIAGNOSIS — Z794 Long term (current) use of insulin: Secondary | ICD-10-CM

## 2017-12-11 DIAGNOSIS — C787 Secondary malignant neoplasm of liver and intrahepatic bile duct: Secondary | ICD-10-CM

## 2017-12-11 DIAGNOSIS — L89152 Pressure ulcer of sacral region, stage 2: Secondary | ICD-10-CM

## 2017-12-11 DIAGNOSIS — E119 Type 2 diabetes mellitus without complications: Principal | ICD-10-CM

## 2017-12-11 DIAGNOSIS — C3412 Malignant neoplasm of upper lobe, left bronchus or lung: Secondary | ICD-10-CM

## 2017-12-11 DIAGNOSIS — Z9181 History of falling: Secondary | ICD-10-CM

## 2017-12-11 DIAGNOSIS — D510 Vitamin B12 deficiency anemia due to intrinsic factor deficiency: Secondary | ICD-10-CM

## 2017-12-11 DIAGNOSIS — I2699 Other pulmonary embolism without acute cor pulmonale: Secondary | ICD-10-CM

## 2017-12-11 DIAGNOSIS — K30 Functional dyspepsia: Secondary | ICD-10-CM

## 2017-12-11 DIAGNOSIS — M4854XD Collapsed vertebra, not elsewhere classified, thoracic region, subsequent encounter for fracture with routine healing: Secondary | ICD-10-CM

## 2017-12-11 DIAGNOSIS — I4891 Unspecified atrial fibrillation: Secondary | ICD-10-CM

## 2017-12-11 DIAGNOSIS — S81012D Laceration without foreign body, left knee, subsequent encounter: Secondary | ICD-10-CM

## 2017-12-11 DIAGNOSIS — N6310 Unspecified lump in the right breast, unspecified quadrant: Secondary | ICD-10-CM

## 2017-12-11 DIAGNOSIS — Z7901 Long term (current) use of anticoagulants: Secondary | ICD-10-CM

## 2017-12-11 DIAGNOSIS — M8580 Other specified disorders of bone density and structure, unspecified site: Secondary | ICD-10-CM

## 2017-12-11 DIAGNOSIS — E041 Nontoxic single thyroid nodule: Secondary | ICD-10-CM

## 2017-12-11 DIAGNOSIS — R1310 Dysphagia, unspecified: Secondary | ICD-10-CM

## 2017-12-11 DIAGNOSIS — I10 Essential (primary) hypertension: Secondary | ICD-10-CM

## 2017-12-11 DIAGNOSIS — Z9221 Personal history of antineoplastic chemotherapy: Secondary | ICD-10-CM

## 2017-12-11 DIAGNOSIS — Z4803 Encounter for change or removal of drains: Secondary | ICD-10-CM

## 2017-12-11 DIAGNOSIS — E1165 Type 2 diabetes mellitus with hyperglycemia: Secondary | ICD-10-CM

## 2017-12-11 DIAGNOSIS — J910 Malignant pleural effusion: Secondary | ICD-10-CM

## 2017-12-11 DIAGNOSIS — C7931 Secondary malignant neoplasm of brain: Secondary | ICD-10-CM

## 2017-12-11 MED ORDER — BLOOD-GLUCOSE METER KIT
0 refills | 0 days | Status: CP
Start: 2017-12-11 — End: 2018-01-07

## 2017-12-11 MED ORDER — BLOOD SUGAR DIAGNOSTIC STRIPS
ORAL_STRIP | 5 refills | 0 days | Status: CP
Start: 2017-12-11 — End: ?

## 2017-12-11 NOTE — Unmapped (Signed)
Assessment and Plan  [name] [name] is a 84 y.o. female with a PMH as above who presents to the clinic for the following:    Problem List Items Addressed This Visit     Diabetes mellitus, type 2 (CMS-HCC) - Primary     Not at goal  CONTINUE Lantus 10u qhs, continue qachs bg checks. If BGs>200, can increase Lantus to 15u qhs.  Recommend getting new glucometer as there is a 150pt differential when checked in the office today.  CONTINUE metformin xr 500mg  BID d/t GI distress.  Urged son not to adjust medication dose and let me know if pt has SE or hypoglycemic episodes.    Lab Results   Component Value Date    Hemoglobin A1C 8.1 (A) 09/26/2017    Hemoglobin A1C 9.6 (A) 08/22/2017    Hemoglobin A1C 10.3 (A) 08/01/2017    Hemoglobin A1C 8.1 (H) 05/19/2017             Relevant Medications    blood-glucose meter kit    blood sugar diagnostic Strp    Other Relevant Orders    POCT glucose (Completed)    Pressure injury of sacral region, stage 2 (CMS-HCC)     Ordered HH referral for wound care         Relevant Orders    Ambulatory referral to Home Health    Knee laceration, left, subsequent encounter     Recommend washing affected area with soap and water and covering with gauze. Can stop neosporin.  HH referral for wound care as well as sacral decubitus ulcer.               Return in about 4 weeks (around 01/08/2018) for DM2.    Chief Complaint  Chief Complaint   Patient presents with   ??? Elevated Blood Sugar       HPI  [name] [name] is a 84 y.o. female with a PMH as below who presents for the following:    L knee laceration  Seen by ED on 11/08/2017 for fall. Pts son states that pt was using a walker and fell forward. Pts son states that the sutures on left knee broke and he put butterfly bandages over the wound. Sutures removed today. No s/sx of infection. Has been cleaning with mild soap and water and applying neosporin.    Sacral decubitus ulcer - progression since last seen here. They have not been rotating her during the day.    DMII  - Meter is reading blood sugars between 400-500 before meals  - Usually between 100-300 in the mornings when she firstwakes up  - Appetite decreased. Gets routine meals but has to be forced to eat. Eats breakfast and dinner, usually skips lunch. Drinks water throughout the day.  - Continues on Meformin 1000 mg BID  - She was changed from Novolog 70-30 9u BID to Lantus 10u qhs to simplify dosing    Lab Results   Component Value Date    Hemoglobin A1C 8.1 (A) 09/26/2017    Hemoglobin A1C 9.6 (A) 08/22/2017    Hemoglobin A1C 10.3 (A) 08/01/2017    Hemoglobin A1C 8.1 (H) 05/19/2017      HTN  Currently taking losartan 100mg , lasix 20mg  prn and potassium supplement.   - BP continues to be soft. On previous visit we stopped amlodipine 5mg  which has not helped.  BP Readings from Last 3 Encounters:   12/14/17 102/62   11/30/17 123/59   11/28/17 108/66  Last OV  Chest pain  Pt reports chest pain from ED visit has resolved.     Thoracic compression fracture  Pt reports having occassional back pain.She currently takes 1000mg  otc Tylenol and Salonpas for pain relief.     Acute pulmonary embolism  Emboli in right upper lobe segmental and subsegmental arteries 04/30/17, taking apixaban BID.     Metastatic lung cancer  Left upper lung 10cm mass found 04/30/17 with 2.7cm right upper lobe nodule. With mediastinal lymphadenopathy and hepatic lobe mass. Liver lesion biopsied 3/21 confirmed adenocarcinoma of lung.   - upcoming f/u for pleural effusion. Stop eliquis as recommend.  - No cardiopulm complaints - admission sxs of CP/DOE have resolved    Past Medical History:   Diagnosis Date   ??? Atrial fibrillation (CMS-HCC)    ??? Cancer (CMS-HCC)    ??? Diabetes mellitus (CMS-HCC)    ??? Hypertension    ??? Metastatic lung cancer (metastasis from lung to other site) (CMS-HCC)    ??? Pulmonary embolism (CMS-HCC)      Current Outpatient Medications on File Prior to Visit   Medication Sig Dispense Refill   ??? calcium carbonate-vitamin D3 (CALTRATE 600 + D) 600 mg (1,500 mg)-800 unit Chew Chew 1 tablet Two (2) times a day.     ??? crizotinib (XALKORI) 250 mg capsule Take 1 capsule (250 mg total) by mouth Two (2) times a day. 60 capsule 3   ??? ELIQUIS 5 mg Tab Take 5 mg by mouth Two (2) times a day. TAKE 1 TABLET (5 MG TOTAL) BY MOUTH TWO (2) TIMES A DAY.  3   ??? furosemide (LASIX) 20 MG tablet Take 20 mg by mouth daily as needed for swelling.     ??? insulin glargine (BASAGLAR, LANTUS) 100 unit/mL (3 mL) injection pen Inject 0.1 mL (10 Units total) under the skin nightly. 9 mL 3   ??? lidocaine 4 % patch Place 1 patch on the skin daily.     ??? losartan (COZAAR) 50 MG tablet Take 1 tablet (50 mg total) by mouth daily. 90 tablet 0   ??? metFORMIN (GLUCOPHAGE-XR) 500 MG 24 hr tablet TAKE 2 TABLETS (1,000 MG TOTAL) BY MOUTH TWO (2) TIMES A DAY. 360 tablet 3   ??? pen needle, diabetic (BD ULTRA-FINE SHORT PEN NEEDLE) 31 gauge x 5/16 Ndle USE AS DIRECTED. USE 4 NEEDLES A DAY WITH DIABETIC MEDICATION PENS AS DIRECTED     ??? potassium chloride SA (K-DUR,KLOR-CON) 20 MEQ tablet Take 1 tablet (20 mEq total) by mouth daily. 90 tablet 1   ??? ranitidine (ZANTAC) 150 MG tablet Take 1 tablet (150 mg total) by mouth Two (2) times a day. 180 tablet 3   ??? simethicone (GAS-X) 80 MG chewable tablet Chew 1 tablet (80 mg total) 4 (four) times a day as needed for flatulence (gas relief). 100 tablet 2     No current facility-administered medications on file prior to visit.      Social History     Socioeconomic History   ??? Marital status: Widowed     Spouse name: Not on file   ??? Number of children: Not on file   ??? Years of education: Not on file   ??? Highest education level: Not on file   Occupational History   ??? Not on file   Social Needs   ??? Financial resource strain: Not on file   ??? Food insecurity:     Worry: Not on file     Inability:  Not on file   ??? Transportation needs:     Medical: Not on file     Non-medical: Not on file   Tobacco Use   ??? Smoking status: Never Smoker   ??? Smokeless tobacco: Never Used   Substance and Sexual Activity   ??? Alcohol use: No   ??? Drug use: Never   ??? Sexual activity: Not on file   Lifestyle   ??? Physical activity:     Days per week: Not on file     Minutes per session: Not on file   ??? Stress: Not on file   Relationships   ??? Social connections:     Talks on phone: Not on file     Gets together: Not on file     Attends religious service: Not on file     Active member of club or organization: Not on file     Attends meetings of clubs or organizations: Not on file     Relationship status: Not on file   Other Topics Concern   ??? Do you use sunscreen? No   ??? Tanning bed use? No   ??? Are you easily burned? No   ??? Excessive sun exposure? No   ??? Blistering sunburns? No   Social History Narrative   ??? Not on file       The following portions of the patient's history were reviewed and updated as appropriate: allergies, current medications, past family history, past medical history, past social history, past surgical history and problem list.    Review of Systems   Comprehensive ROS negative except above    PHYSICAL EXAM  Pulse 84  - Temp 36.1 ??C (97 ??F) (Oral)  - Ht 162.6 cm (5' 4)  - Wt 59.9 kg (132 lb)  - SpO2 97%  - BMI 22.66 kg/m??   General appearance: alert, cooperative, NAD   Head: normocephalic and atraumatic  Eyes: normal EOMs, PERRL, normal conjunctiva, no discharge  ENT: OP clear and moist, no tonsillar exudates or erythema, normal TMs and external ear canals, normal nasal passages  Neck: normal ROM, supple, no thyromegaly, no cervical/supraclavicular lymphadenopathy  Lungs: normal work of breathing, good air entry and upper lobes and clear to auscultation bilaterally, decreased bibasilar breath sounds. no wheezes or crackles appreciated   Heart: regular rate and rhythm, S1, S2 normal, no murmur, click, rub or gallop   Abdomen: Abdomen soft, non-tender, non distended. No masses, no organomegaly. Bowel sounds present.  Extremities: extremities normal, atraumatic, no cyanosis or edema  NEURO: AAOx3, appropriately responds to questions, spontaneous movement of extremities  Psych: normal mood and affect    [facility] Components:     Barriers to goals identified and addressed. Pertinent handouts were given today and reviewed with the patient as indicated.  The Care Plan and Self-Management goals have been included on the AVS and the AVS has been printed. Any outside resources or referrals needed at this time are noted above. Patient's current medications have been reviewed. Any new medications prescribed have been discussed, and side effects have been addressed. Have assessed the patient's understanding, response, and barriers to adherence to medications. Patient voiced understanding and all questions have been answered to satisfaction.     I attest that I, Su Hilt, personally documented this note while acting as scribe for Dr. Jarvis Newcomer B. Allena Katz, MD.   Su Hilt, Scribe  12/18/2017 9:28 PM

## 2017-12-11 NOTE — Unmapped (Signed)
Patient given same day appointment with PCP.

## 2017-12-11 NOTE — Unmapped (Addendum)
Not at goal  CONTINUE Lantus 10u qhs, continue qachs bg checks. If BGs>200, can increase Lantus to 15u qhs.  Recommend getting new glucometer as there is a 150pt differential when checked in the office today.  CONTINUE metformin xr 500mg  BID d/t GI distress.  Urged son not to adjust medication dose and let me know if pt has SE or hypoglycemic episodes.    Lab Results   Component Value Date    Hemoglobin A1C 8.1 (A) 09/26/2017    Hemoglobin A1C 9.6 (A) 08/22/2017    Hemoglobin A1C 10.3 (A) 08/01/2017    Hemoglobin A1C 8.1 (H) 05/19/2017

## 2017-12-13 DIAGNOSIS — R530 Neoplastic (malignant) related fatigue: Secondary | ICD-10-CM | POA: Diagnosis not present

## 2017-12-13 DIAGNOSIS — I482 Chronic atrial fibrillation, unspecified: Secondary | ICD-10-CM | POA: Diagnosis not present

## 2017-12-13 DIAGNOSIS — C3412 Malignant neoplasm of upper lobe, left bronchus or lung: Secondary | ICD-10-CM | POA: Diagnosis not present

## 2017-12-13 DIAGNOSIS — E119 Type 2 diabetes mellitus without complications: Secondary | ICD-10-CM | POA: Diagnosis not present

## 2017-12-13 DIAGNOSIS — I10 Essential (primary) hypertension: Secondary | ICD-10-CM | POA: Diagnosis not present

## 2017-12-13 DIAGNOSIS — Z9181 History of falling: Secondary | ICD-10-CM | POA: Diagnosis not present

## 2017-12-14 ENCOUNTER — Inpatient Hospital Stay: Admit: 2017-12-14 | Discharge: 2018-02-11 | Payer: MEDICARE

## 2017-12-14 ENCOUNTER — Encounter: Admit: 2017-12-14 | Discharge: 2018-02-11 | Payer: MEDICARE

## 2017-12-14 DIAGNOSIS — J910 Malignant pleural effusion: Secondary | ICD-10-CM

## 2017-12-14 DIAGNOSIS — Z9181 History of falling: Secondary | ICD-10-CM

## 2017-12-14 DIAGNOSIS — D510 Vitamin B12 deficiency anemia due to intrinsic factor deficiency: Secondary | ICD-10-CM

## 2017-12-14 DIAGNOSIS — I4891 Unspecified atrial fibrillation: Secondary | ICD-10-CM

## 2017-12-14 DIAGNOSIS — Z794 Long term (current) use of insulin: Secondary | ICD-10-CM

## 2017-12-14 DIAGNOSIS — Z7901 Long term (current) use of anticoagulants: Secondary | ICD-10-CM

## 2017-12-14 DIAGNOSIS — E1165 Type 2 diabetes mellitus with hyperglycemia: Secondary | ICD-10-CM

## 2017-12-14 DIAGNOSIS — M8580 Other specified disorders of bone density and structure, unspecified site: Secondary | ICD-10-CM

## 2017-12-14 DIAGNOSIS — M4854XD Collapsed vertebra, not elsewhere classified, thoracic region, subsequent encounter for fracture with routine healing: Secondary | ICD-10-CM

## 2017-12-14 DIAGNOSIS — I2699 Other pulmonary embolism without acute cor pulmonale: Secondary | ICD-10-CM

## 2017-12-14 DIAGNOSIS — Z4803 Encounter for change or removal of drains: Secondary | ICD-10-CM

## 2017-12-14 DIAGNOSIS — I10 Essential (primary) hypertension: Secondary | ICD-10-CM

## 2017-12-14 DIAGNOSIS — Z9221 Personal history of antineoplastic chemotherapy: Secondary | ICD-10-CM

## 2017-12-14 DIAGNOSIS — N6310 Unspecified lump in the right breast, unspecified quadrant: Secondary | ICD-10-CM

## 2017-12-14 DIAGNOSIS — L89152 Pressure ulcer of sacral region, stage 2: Principal | ICD-10-CM

## 2017-12-14 DIAGNOSIS — R1310 Dysphagia, unspecified: Secondary | ICD-10-CM

## 2017-12-14 DIAGNOSIS — S81012D Laceration without foreign body, left knee, subsequent encounter: Secondary | ICD-10-CM

## 2017-12-14 DIAGNOSIS — C787 Secondary malignant neoplasm of liver and intrahepatic bile duct: Secondary | ICD-10-CM

## 2017-12-14 DIAGNOSIS — C7931 Secondary malignant neoplasm of brain: Secondary | ICD-10-CM

## 2017-12-14 DIAGNOSIS — C3412 Malignant neoplasm of upper lobe, left bronchus or lung: Secondary | ICD-10-CM

## 2017-12-14 DIAGNOSIS — D630 Anemia in neoplastic disease: Secondary | ICD-10-CM

## 2017-12-14 DIAGNOSIS — E041 Nontoxic single thyroid nodule: Secondary | ICD-10-CM

## 2017-12-14 DIAGNOSIS — K30 Functional dyspepsia: Secondary | ICD-10-CM

## 2017-12-14 NOTE — Unmapped (Signed)
The home health referral was ordered during the visit and is being processed. In the meantime, they should continue cleaning it gently with mild soap and water. They can let it air dry if at home or cover it with gauze.

## 2017-12-15 DIAGNOSIS — E1165 Type 2 diabetes mellitus with hyperglycemia: Secondary | ICD-10-CM

## 2017-12-15 DIAGNOSIS — L89152 Pressure ulcer of sacral region, stage 2: Principal | ICD-10-CM

## 2017-12-15 DIAGNOSIS — D630 Anemia in neoplastic disease: Secondary | ICD-10-CM

## 2017-12-15 DIAGNOSIS — S81012D Laceration without foreign body, left knee, subsequent encounter: Secondary | ICD-10-CM

## 2017-12-15 DIAGNOSIS — C787 Secondary malignant neoplasm of liver and intrahepatic bile duct: Secondary | ICD-10-CM

## 2017-12-15 DIAGNOSIS — Z794 Long term (current) use of insulin: Secondary | ICD-10-CM

## 2017-12-15 DIAGNOSIS — I4891 Unspecified atrial fibrillation: Secondary | ICD-10-CM

## 2017-12-15 DIAGNOSIS — E041 Nontoxic single thyroid nodule: Secondary | ICD-10-CM

## 2017-12-15 DIAGNOSIS — Z7901 Long term (current) use of anticoagulants: Secondary | ICD-10-CM

## 2017-12-15 DIAGNOSIS — I10 Essential (primary) hypertension: Secondary | ICD-10-CM

## 2017-12-15 DIAGNOSIS — C7931 Secondary malignant neoplasm of brain: Secondary | ICD-10-CM

## 2017-12-15 DIAGNOSIS — C3412 Malignant neoplasm of upper lobe, left bronchus or lung: Secondary | ICD-10-CM

## 2017-12-15 DIAGNOSIS — M8580 Other specified disorders of bone density and structure, unspecified site: Secondary | ICD-10-CM

## 2017-12-15 DIAGNOSIS — R1310 Dysphagia, unspecified: Secondary | ICD-10-CM

## 2017-12-15 DIAGNOSIS — K30 Functional dyspepsia: Secondary | ICD-10-CM

## 2017-12-15 DIAGNOSIS — M4854XD Collapsed vertebra, not elsewhere classified, thoracic region, subsequent encounter for fracture with routine healing: Secondary | ICD-10-CM

## 2017-12-15 DIAGNOSIS — Z9181 History of falling: Secondary | ICD-10-CM

## 2017-12-15 DIAGNOSIS — Z4803 Encounter for change or removal of drains: Secondary | ICD-10-CM

## 2017-12-15 DIAGNOSIS — I2699 Other pulmonary embolism without acute cor pulmonale: Secondary | ICD-10-CM

## 2017-12-15 DIAGNOSIS — Z9221 Personal history of antineoplastic chemotherapy: Secondary | ICD-10-CM

## 2017-12-15 DIAGNOSIS — J910 Malignant pleural effusion: Secondary | ICD-10-CM

## 2017-12-15 DIAGNOSIS — D510 Vitamin B12 deficiency anemia due to intrinsic factor deficiency: Secondary | ICD-10-CM

## 2017-12-15 DIAGNOSIS — N6310 Unspecified lump in the right breast, unspecified quadrant: Secondary | ICD-10-CM

## 2017-12-15 DIAGNOSIS — R530 Neoplastic (malignant) related fatigue: Secondary | ICD-10-CM | POA: Diagnosis not present

## 2017-12-15 DIAGNOSIS — I482 Chronic atrial fibrillation, unspecified: Secondary | ICD-10-CM | POA: Diagnosis not present

## 2017-12-15 DIAGNOSIS — E119 Type 2 diabetes mellitus without complications: Secondary | ICD-10-CM | POA: Diagnosis not present

## 2017-12-16 NOTE — Unmapped (Signed)
Called to follow up with caregiver [name]. Same day appointment offered and patient denied. Both caregiver and patient prefer to be seen Monday. Appointment made.

## 2017-12-18 MED FILL — XALKORI 250 MG CAPSULE: ORAL | 30 days supply | Qty: 60 | Fill #2

## 2017-12-18 MED FILL — XALKORI 250 MG CAPSULE: 30 days supply | Qty: 60 | Fill #2 | Status: AC

## 2017-12-19 DIAGNOSIS — N6310 Unspecified lump in the right breast, unspecified quadrant: Secondary | ICD-10-CM

## 2017-12-19 DIAGNOSIS — C7931 Secondary malignant neoplasm of brain: Secondary | ICD-10-CM

## 2017-12-19 DIAGNOSIS — Z9221 Personal history of antineoplastic chemotherapy: Secondary | ICD-10-CM

## 2017-12-19 DIAGNOSIS — K30 Functional dyspepsia: Secondary | ICD-10-CM

## 2017-12-19 DIAGNOSIS — M4854XD Collapsed vertebra, not elsewhere classified, thoracic region, subsequent encounter for fracture with routine healing: Secondary | ICD-10-CM

## 2017-12-19 DIAGNOSIS — I2699 Other pulmonary embolism without acute cor pulmonale: Secondary | ICD-10-CM

## 2017-12-19 DIAGNOSIS — R1310 Dysphagia, unspecified: Secondary | ICD-10-CM

## 2017-12-19 DIAGNOSIS — C3412 Malignant neoplasm of upper lobe, left bronchus or lung: Secondary | ICD-10-CM

## 2017-12-19 DIAGNOSIS — L89152 Pressure ulcer of sacral region, stage 2: Principal | ICD-10-CM

## 2017-12-19 DIAGNOSIS — Z9181 History of falling: Secondary | ICD-10-CM

## 2017-12-19 DIAGNOSIS — C787 Secondary malignant neoplasm of liver and intrahepatic bile duct: Secondary | ICD-10-CM

## 2017-12-19 DIAGNOSIS — I10 Essential (primary) hypertension: Secondary | ICD-10-CM

## 2017-12-19 DIAGNOSIS — S81012D Laceration without foreign body, left knee, subsequent encounter: Secondary | ICD-10-CM

## 2017-12-19 DIAGNOSIS — Z4803 Encounter for change or removal of drains: Secondary | ICD-10-CM

## 2017-12-19 DIAGNOSIS — D510 Vitamin B12 deficiency anemia due to intrinsic factor deficiency: Secondary | ICD-10-CM

## 2017-12-19 DIAGNOSIS — J910 Malignant pleural effusion: Secondary | ICD-10-CM

## 2017-12-19 DIAGNOSIS — E1165 Type 2 diabetes mellitus with hyperglycemia: Secondary | ICD-10-CM

## 2017-12-19 DIAGNOSIS — E041 Nontoxic single thyroid nodule: Secondary | ICD-10-CM

## 2017-12-19 DIAGNOSIS — D630 Anemia in neoplastic disease: Secondary | ICD-10-CM

## 2017-12-19 DIAGNOSIS — Z7901 Long term (current) use of anticoagulants: Secondary | ICD-10-CM

## 2017-12-19 DIAGNOSIS — Z794 Long term (current) use of insulin: Secondary | ICD-10-CM

## 2017-12-19 DIAGNOSIS — M8580 Other specified disorders of bone density and structure, unspecified site: Secondary | ICD-10-CM

## 2017-12-19 DIAGNOSIS — I4891 Unspecified atrial fibrillation: Secondary | ICD-10-CM

## 2017-12-19 DIAGNOSIS — R530 Neoplastic (malignant) related fatigue: Secondary | ICD-10-CM | POA: Diagnosis not present

## 2017-12-19 DIAGNOSIS — I482 Chronic atrial fibrillation, unspecified: Secondary | ICD-10-CM | POA: Diagnosis not present

## 2017-12-19 DIAGNOSIS — E119 Type 2 diabetes mellitus without complications: Secondary | ICD-10-CM | POA: Diagnosis not present

## 2017-12-19 NOTE — Unmapped (Signed)
Recommend washing affected area with soap and water and covering with gauze. Can stop neosporin.  HH referral for wound care as well as sacral decubitus ulcer.

## 2017-12-19 NOTE — Unmapped (Signed)
Per Dr. Eliane Decree instructions, the patient was advised.

## 2017-12-19 NOTE — Unmapped (Signed)
Ordered HH referral for wound care

## 2017-12-20 ENCOUNTER — Ambulatory Visit: Admit: 2017-12-20 | Discharge: 2017-12-21 | Payer: MEDICARE | Attending: Family Medicine | Primary: Family Medicine

## 2017-12-20 DIAGNOSIS — L89151 Pressure ulcer of sacral region, stage 1: Secondary | ICD-10-CM

## 2017-12-20 DIAGNOSIS — Z794 Long term (current) use of insulin: Secondary | ICD-10-CM

## 2017-12-20 DIAGNOSIS — R399 Unspecified symptoms and signs involving the genitourinary system: Principal | ICD-10-CM

## 2017-12-20 DIAGNOSIS — E119 Type 2 diabetes mellitus without complications: Secondary | ICD-10-CM

## 2017-12-20 DIAGNOSIS — L89152 Pressure ulcer of sacral region, stage 2: Secondary | ICD-10-CM

## 2017-12-20 MED ORDER — INSULIN ASPAR PROT-INSULIN ASPART 100 UNIT/ML (70-30) SUBCUTANEOUS PEN
Freq: Two times a day (BID) | SUBCUTANEOUS | 0 refills | 0 days | Status: CP
Start: 2017-12-20 — End: 2018-01-08

## 2017-12-20 NOTE — Unmapped (Addendum)
Not at goal  - Stop Lantus 10u qhs  - Change back to Novolog 70/30 injection pen 10u BID  - Continue Metformin XR 500mg  BID d/t GI distress  - Continue sending BG readings 1-2x weekly  Urged son not to adjust medication dose and let me know if pt has SE or hypoglycemic episodes    Lab Results   Component Value Date    Hemoglobin A1C 8.1 (A) 09/26/2017    Hemoglobin A1C 9.6 (A) 08/22/2017    Hemoglobin A1C 10.3 (A) 08/01/2017    Hemoglobin A1C 8.1 (H) 05/19/2017

## 2017-12-20 NOTE — Unmapped (Signed)
POCT UA with glucose otherwise normal.  Sent for UCx

## 2017-12-20 NOTE — Unmapped (Signed)
[facility] cancelled referral since pt opted to stay with [facility]. Placed another referral for [facility] for wound care.

## 2017-12-20 NOTE — Unmapped (Signed)
Addended by: Sarajane Marek T on: 12/20/2017 01:27 PM     Modules accepted: Orders

## 2017-12-20 NOTE — Unmapped (Signed)
Assessment/Plan:         Problem List Items Addressed This Visit     Diabetes mellitus, type 2 (CMS-HCC)     Not at goal  - Stop Lantus 10u qhs  - Change back to Novolog 70/30 injection pen 10u BID  - Continue Metformin XR 500mg  BID d/t GI distress  - Continue sending BG readings 1-2x weekly  Urged son not to adjust medication dose and let me know if pt has SE or hypoglycemic episodes    Lab Results   Component Value Date    Hemoglobin A1C 8.1 (A) 09/26/2017    Hemoglobin A1C 9.6 (A) 08/22/2017    Hemoglobin A1C 10.3 (A) 08/01/2017    Hemoglobin A1C 8.1 (H) 05/19/2017             Relevant Medications    insulin aspart protamine-insulin aspart (NOVOLOG MIX 70-30FLEXPEN U-100) 100 unit/mL (70-30) injection pen    Pressure injury of sacral region, stage 2 (CMS-HCC)     [facility] cancelled referral since pt opted to stay with [facility]. Placed another referral for [facility] for wound care.         Relevant Orders    Ambulatory referral to Home Health    UTI symptoms - Primary     POCT UA with glucose otherwise normal.  Sent for UCx         Relevant Orders    POCT urinalysis dipstick (Completed)      Other Visit Diagnoses     Pressure injury of sacral region, stage 1            I have reviewed and addressed the patient???s adherence and response to prescribed medications. I have identified patient barriers to following the proposed medication and treatment plan, and have noted opportunities to optimize healthy behaviors. I have answered the patient???s questions to satisfaction and the patient voices understanding.    Return for Recheck.       Subjective:            Patient ID: [name] [name] is a 84 y.o. female who presents for follow up of the following:  Informant: Patient is accompanied by son.    Chief Complaint   Patient presents with   ??? Possible UTI     HPI    Patient presents with a complaint of urinary odor by her daughter in law. Her energy levels remain unchanging, lethargic. Her last UTI was in 2017. She denies abdominal pain, dysuria, hematuria, and inconsistent bowels.    Sacral decub - stable. HH was ordered on last visit    DM2  She is currently taking Lantus 10u qPM. At home BG readings in AM are about 70-low 200s, in afternoons and evenings they run high around 300, 450. She is also taking Metformin XR 500 mg BID.     L knee laceration  Seen by ED on 11/08/2017 for fall. Pts son states that pt was using a walker and fell forward. Pts son states that the sutures on left knee broke and he put butterfly bandages over the wound. Sutures removed at last OV. No s/sx of infection. Has been cleaning with mild soap and water and applying neosporin.    ROS  Comprehensive ROS negative except as per HPI    Outpatient Medications Prior to Visit   Medication Sig Dispense Refill   ??? blood sugar diagnostic Strp Check 4x/day as instructed 120 strip 5   ??? blood-glucose meter kit Check BGs 4x/day as  instructed 1 each 0   ??? calcium carbonate-vitamin D3 (CALTRATE 600 + D) 600 mg (1,500 mg)-800 unit Chew Chew 1 tablet Two (2) times a day.     ??? crizotinib (XALKORI) 250 mg capsule Take 1 capsule (250 mg total) by mouth Two (2) times a day. 60 capsule 3   ??? ELIQUIS 5 mg Tab Take 5 mg by mouth Two (2) times a day. TAKE 1 TABLET (5 MG TOTAL) BY MOUTH TWO (2) TIMES A DAY.  3   ??? furosemide (LASIX) 20 MG tablet Take 20 mg by mouth daily as needed for swelling.     ??? lidocaine 4 % patch Place 1 patch on the skin daily.     ??? losartan (COZAAR) 50 MG tablet Take 1 tablet (50 mg total) by mouth daily. 90 tablet 0   ??? metFORMIN (GLUCOPHAGE-XR) 500 MG 24 hr tablet TAKE 2 TABLETS (1,000 MG TOTAL) BY MOUTH TWO (2) TIMES A DAY. 360 tablet 3   ??? ONETOUCH ULTRA2 METER Misc CHECK BGS 4X/DAY AS INSTRUCTED  0   ??? pen needle, diabetic (BD ULTRA-FINE SHORT PEN NEEDLE) 31 gauge x 5/16 Ndle USE AS DIRECTED. USE 4 NEEDLES A DAY WITH DIABETIC MEDICATION PENS AS DIRECTED     ??? potassium chloride SA (K-DUR,KLOR-CON) 20 MEQ tablet Take 1 tablet (20 mEq total) by mouth daily. 90 tablet 1   ??? ranitidine (ZANTAC) 150 MG tablet Take 1 tablet (150 mg total) by mouth Two (2) times a day. 180 tablet 3   ??? simethicone (GAS-X) 80 MG chewable tablet Chew 1 tablet (80 mg total) 4 (four) times a day as needed for flatulence (gas relief). 100 tablet 2   ??? insulin glargine (BASAGLAR, LANTUS) 100 unit/mL (3 mL) injection pen Inject 0.1 mL (10 Units total) under the skin nightly. 9 mL 3     No facility-administered medications prior to visit.        The following portions of the patient's history were reviewed and updated as appropriate: allergies, current medications, past medical history, past social history and problem list.          Objective:            Vital Signs  BP 102/82  - Pulse 84  - Temp 36.1 ??C (97 ??F) (Oral)  - Resp 16  - Ht 162.6 cm (5' 4.02)  - Wt 61.2 kg (135 lb)  - BMI 23.16 kg/m??      Exam  General appearance: alert, cooperative, NAD   Head: normocephalic and atraumatic  Eyes: normal EOMs, PERRL, normal conjunctiva, no discharge  ENT: OP clear and moist, no tonsillar exudates or erythema, normal TMs and external ear canals, normal nasal passages  Neck: normal ROM, supple, no thyromegaly, no cervical/supraclavicular lymphadenopathy  Lungs: normal work of breathing, good air entry and clear to auscultation bilaterally, no wheezes or crackles appreciated   Heart: regular rate and rhythm, S1, S2 normal, no murmur, click, rub or gallop   Abdomen: Abdomen soft, non-tender, non distended. No masses, no organomegaly. Bowel sounds present.  Extremities: extremities normal, atraumatic, no cyanosis or edema  NEURO: AAOx3, appropriately responds to questions, spontaneous movement of extremities  Psych: normal mood and affect      I attest that I, Pollyann Savoy, personally documented this note while acting as scribe for [name], MD.      Pollyann Savoy, Scribe.  12/20/2017     The documentation recorded by the scribe accurately reflects the  service I personally performed and the decisions made by me.    [name], MD

## 2017-12-21 DIAGNOSIS — I482 Chronic atrial fibrillation, unspecified: Secondary | ICD-10-CM | POA: Diagnosis not present

## 2017-12-21 DIAGNOSIS — R530 Neoplastic (malignant) related fatigue: Secondary | ICD-10-CM | POA: Diagnosis not present

## 2017-12-21 DIAGNOSIS — Z9181 History of falling: Secondary | ICD-10-CM | POA: Diagnosis not present

## 2017-12-21 DIAGNOSIS — E119 Type 2 diabetes mellitus without complications: Secondary | ICD-10-CM | POA: Diagnosis not present

## 2017-12-21 DIAGNOSIS — I10 Essential (primary) hypertension: Secondary | ICD-10-CM | POA: Diagnosis not present

## 2017-12-21 DIAGNOSIS — C3412 Malignant neoplasm of upper lobe, left bronchus or lung: Secondary | ICD-10-CM | POA: Diagnosis not present

## 2017-12-22 DIAGNOSIS — C3412 Malignant neoplasm of upper lobe, left bronchus or lung: Secondary | ICD-10-CM | POA: Diagnosis not present

## 2017-12-22 DIAGNOSIS — E119 Type 2 diabetes mellitus without complications: Secondary | ICD-10-CM | POA: Diagnosis not present

## 2017-12-22 DIAGNOSIS — I482 Chronic atrial fibrillation, unspecified: Secondary | ICD-10-CM | POA: Diagnosis not present

## 2017-12-22 DIAGNOSIS — I10 Essential (primary) hypertension: Secondary | ICD-10-CM | POA: Diagnosis not present

## 2017-12-22 DIAGNOSIS — R530 Neoplastic (malignant) related fatigue: Secondary | ICD-10-CM | POA: Diagnosis not present

## 2017-12-22 DIAGNOSIS — Z9181 History of falling: Secondary | ICD-10-CM | POA: Diagnosis not present

## 2017-12-25 DIAGNOSIS — I482 Chronic atrial fibrillation, unspecified: Secondary | ICD-10-CM | POA: Diagnosis not present

## 2017-12-25 DIAGNOSIS — Z9181 History of falling: Secondary | ICD-10-CM | POA: Diagnosis not present

## 2017-12-25 DIAGNOSIS — I10 Essential (primary) hypertension: Secondary | ICD-10-CM | POA: Diagnosis not present

## 2017-12-25 DIAGNOSIS — C3412 Malignant neoplasm of upper lobe, left bronchus or lung: Secondary | ICD-10-CM | POA: Diagnosis not present

## 2017-12-25 DIAGNOSIS — E119 Type 2 diabetes mellitus without complications: Secondary | ICD-10-CM | POA: Diagnosis not present

## 2017-12-25 DIAGNOSIS — R530 Neoplastic (malignant) related fatigue: Secondary | ICD-10-CM | POA: Diagnosis not present

## 2017-12-25 NOTE — Unmapped (Signed)
Amlodipine discontinued on 09/26/2017 by PCP. See note.

## 2017-12-26 NOTE — Unmapped (Signed)
Fax received from [facility]. Forms place in providers box awaiting signature.

## 2017-12-26 NOTE — Unmapped (Signed)
Completed and ready for fax.

## 2017-12-27 DIAGNOSIS — Z9181 History of falling: Secondary | ICD-10-CM | POA: Diagnosis not present

## 2017-12-27 DIAGNOSIS — E119 Type 2 diabetes mellitus without complications: Secondary | ICD-10-CM | POA: Diagnosis not present

## 2017-12-27 DIAGNOSIS — R530 Neoplastic (malignant) related fatigue: Secondary | ICD-10-CM | POA: Diagnosis not present

## 2017-12-27 DIAGNOSIS — C3412 Malignant neoplasm of upper lobe, left bronchus or lung: Secondary | ICD-10-CM | POA: Diagnosis not present

## 2017-12-27 DIAGNOSIS — I10 Essential (primary) hypertension: Secondary | ICD-10-CM | POA: Diagnosis not present

## 2017-12-27 DIAGNOSIS — I482 Chronic atrial fibrillation, unspecified: Secondary | ICD-10-CM | POA: Diagnosis not present

## 2017-12-27 NOTE — Unmapped (Signed)
[facility] Triage Note     Patient: [name] [name]     Reason for call: Supplies    Time call returned: 1239     Phone Assessment: Left message for [name] to let him know that he should contact [employer] regarding supplies. Return call to me at [phone] with any further questions.     Triage Recommendations: None      Patient Response: N/A     Outstanding tasks: None     Patient Pharmacy has been verified and primary pharmacy has been marked as preferred

## 2017-12-27 NOTE — Unmapped (Signed)
Hi,     Fynley Chrystal contacted the [employer] requesting to speak with the care team of Chance JAMANI ELEY to discuss:    Patients spouse called because of difficulty of getting pleurx supplies.  Patients home healthcare nurse from [facility], said she ordered supplies about a week ago but supplies have not been received.    Please contact [name] at [phone].    Program: Pulmonary Interventional   Speciality: Medical Oncology      Thank you,   Jacques Navy  [facility]   [phone]

## 2017-12-29 DIAGNOSIS — C3412 Malignant neoplasm of upper lobe, left bronchus or lung: Secondary | ICD-10-CM | POA: Diagnosis not present

## 2017-12-29 DIAGNOSIS — I482 Chronic atrial fibrillation, unspecified: Secondary | ICD-10-CM | POA: Diagnosis not present

## 2017-12-29 DIAGNOSIS — E119 Type 2 diabetes mellitus without complications: Secondary | ICD-10-CM | POA: Diagnosis not present

## 2017-12-29 DIAGNOSIS — R530 Neoplastic (malignant) related fatigue: Secondary | ICD-10-CM | POA: Diagnosis not present

## 2017-12-29 DIAGNOSIS — Z9181 History of falling: Secondary | ICD-10-CM | POA: Diagnosis not present

## 2017-12-29 DIAGNOSIS — I10 Essential (primary) hypertension: Secondary | ICD-10-CM | POA: Diagnosis not present

## 2017-12-31 ENCOUNTER — Ambulatory Visit: Admit: 2017-12-31 | Discharge: 2018-01-02 | Payer: MEDICARE

## 2017-12-31 DIAGNOSIS — D638 Anemia in other chronic diseases classified elsewhere: Secondary | ICD-10-CM | POA: Diagnosis not present

## 2017-12-31 DIAGNOSIS — L03116 Cellulitis of left lower limb: Secondary | ICD-10-CM | POA: Diagnosis not present

## 2017-12-31 DIAGNOSIS — J910 Malignant pleural effusion: Secondary | ICD-10-CM | POA: Diagnosis not present

## 2017-12-31 DIAGNOSIS — Z7984 Long term (current) use of oral hypoglycemic drugs: Secondary | ICD-10-CM | POA: Diagnosis not present

## 2017-12-31 DIAGNOSIS — Z86711 Personal history of pulmonary embolism: Secondary | ICD-10-CM | POA: Diagnosis not present

## 2017-12-31 DIAGNOSIS — D509 Iron deficiency anemia, unspecified: Secondary | ICD-10-CM | POA: Diagnosis not present

## 2017-12-31 DIAGNOSIS — I4891 Unspecified atrial fibrillation: Secondary | ICD-10-CM | POA: Diagnosis not present

## 2017-12-31 DIAGNOSIS — R600 Localized edema: Secondary | ICD-10-CM | POA: Diagnosis not present

## 2017-12-31 DIAGNOSIS — M79662 Pain in left lower leg: Secondary | ICD-10-CM | POA: Diagnosis not present

## 2017-12-31 DIAGNOSIS — Z9049 Acquired absence of other specified parts of digestive tract: Secondary | ICD-10-CM | POA: Diagnosis not present

## 2017-12-31 DIAGNOSIS — E119 Type 2 diabetes mellitus without complications: Secondary | ICD-10-CM | POA: Diagnosis not present

## 2017-12-31 DIAGNOSIS — Z7901 Long term (current) use of anticoagulants: Secondary | ICD-10-CM | POA: Diagnosis not present

## 2017-12-31 DIAGNOSIS — D649 Anemia, unspecified: Secondary | ICD-10-CM | POA: Diagnosis not present

## 2017-12-31 DIAGNOSIS — Z882 Allergy status to sulfonamides status: Secondary | ICD-10-CM | POA: Diagnosis not present

## 2017-12-31 DIAGNOSIS — I10 Essential (primary) hypertension: Secondary | ICD-10-CM | POA: Diagnosis not present

## 2017-12-31 DIAGNOSIS — I083 Combined rheumatic disorders of mitral, aortic and tricuspid valves: Secondary | ICD-10-CM | POA: Diagnosis not present

## 2017-12-31 DIAGNOSIS — C787 Secondary malignant neoplasm of liver and intrahepatic bile duct: Secondary | ICD-10-CM | POA: Diagnosis not present

## 2017-12-31 DIAGNOSIS — I313 Pericardial effusion (noninflammatory): Secondary | ICD-10-CM | POA: Diagnosis not present

## 2017-12-31 DIAGNOSIS — C3490 Malignant neoplasm of unspecified part of unspecified bronchus or lung: Secondary | ICD-10-CM | POA: Diagnosis not present

## 2017-12-31 DIAGNOSIS — C7931 Secondary malignant neoplasm of brain: Secondary | ICD-10-CM | POA: Diagnosis not present

## 2017-12-31 DIAGNOSIS — D510 Vitamin B12 deficiency anemia due to intrinsic factor deficiency: Secondary | ICD-10-CM | POA: Diagnosis not present

## 2017-12-31 LAB — CBC W/ AUTO DIFF
BASOPHILS ABSOLUTE COUNT: 0 10*9/L (ref 0.0–0.1)
BASOPHILS RELATIVE PERCENT: 0.1 %
EOSINOPHILS ABSOLUTE COUNT: 0 10*9/L (ref 0.0–0.4)
EOSINOPHILS RELATIVE PERCENT: 0.1 %
HEMATOCRIT: 20.3 % — ABNORMAL LOW (ref 36.0–46.0)
HEMOGLOBIN: 7.2 g/dL — ABNORMAL LOW (ref 13.5–16.0)
LARGE UNSTAINED CELLS: 11 % — ABNORMAL HIGH (ref 0–4)
LYMPHOCYTES ABSOLUTE COUNT: 0.1 10*9/L — ABNORMAL LOW (ref 1.5–5.0)
LYMPHOCYTES RELATIVE PERCENT: 1.2 %
MEAN CORPUSCULAR HEMOGLOBIN CONC: 35.3 g/dL (ref 31.0–37.0)
MEAN CORPUSCULAR VOLUME: 67.5 fL — ABNORMAL LOW (ref 80.0–100.0)
MONOCYTES ABSOLUTE COUNT: 5.1 10*9/L — ABNORMAL HIGH (ref 0.2–0.8)
MONOCYTES RELATIVE PERCENT: 70.9 %
NEUTROPHILS ABSOLUTE COUNT: 1.2 10*9/L — ABNORMAL LOW (ref 2.0–7.5)
NEUTROPHILS RELATIVE PERCENT: 16.9 %
PLATELET COUNT: 303 10*9/L (ref 150–440)
RED BLOOD CELL COUNT: 3 10*12/L — ABNORMAL LOW (ref 4.00–5.20)
RED CELL DISTRIBUTION WIDTH: 20.9 % — ABNORMAL HIGH (ref 12.0–15.0)
WBC ADJUSTED: 7.2 10*9/L (ref 4.5–11.0)

## 2017-12-31 LAB — BASIC METABOLIC PANEL
ANION GAP: 4 mmol/L — ABNORMAL LOW (ref 7–15)
BUN / CREAT RATIO: 28
CALCIUM: 7.9 mg/dL — ABNORMAL LOW (ref 8.5–10.2)
CHLORIDE: 108 mmol/L — ABNORMAL HIGH (ref 98–107)
CO2: 27 mmol/L (ref 22.0–30.0)
CREATININE: 0.89 mg/dL (ref 0.60–1.00)
EGFR CKD-EPI AA FEMALE: 69 mL/min/{1.73_m2} (ref >=60)
EGFR CKD-EPI NON-AA FEMALE: 60 mL/min/{1.73_m2} (ref >=60)
GLUCOSE RANDOM: 151 mg/dL (ref 65–179)
POTASSIUM: 3.8 mmol/L (ref 3.5–5.0)
SODIUM: 139 mmol/L (ref 135–145)

## 2017-12-31 LAB — BLOOD UREA NITROGEN: Urea nitrogen:MCnc:Pt:Ser/Plas:Qn:: 25 — ABNORMAL HIGH

## 2017-12-31 LAB — SLIDE REVIEW

## 2017-12-31 LAB — RETICULOCYTES: RETICULOCYTE COUNT PCT: 3.4 % — ABNORMAL HIGH (ref 0.5–2.7)

## 2017-12-31 LAB — RETIC HGB CONTENT: Lab: 27.9 — ABNORMAL LOW

## 2017-12-31 LAB — IRON PANEL
IRON SATURATION (CALC): 10 % — ABNORMAL LOW (ref 15–50)
IRON: 12 ug/dL — ABNORMAL LOW (ref 35–165)

## 2017-12-31 LAB — FERRITIN: Ferritin:MCnc:Pt:Ser/Plas:Qn:: 453 — ABNORMAL HIGH

## 2017-12-31 LAB — HEMOGLOBIN: Lab: 7.2 — ABNORMAL LOW

## 2017-12-31 LAB — HAPTOGLOBIN: Haptoglobin:MCnc:Pt:Ser/Plas:Qn:: 195

## 2017-12-31 LAB — POIKILOCYTES

## 2017-12-31 LAB — IRON: Iron:MCnc:Pt:Ser/Plas:Qn:: 12 — ABNORMAL LOW

## 2017-12-31 MED ORDER — CEPHALEXIN 500 MG CAPSULE
ORAL_CAPSULE | Freq: Four times a day (QID) | ORAL | 0 refills | 0 days | Status: CP
Start: 2017-12-31 — End: 2018-01-02

## 2017-12-31 NOTE — Unmapped (Signed)
Pt with swelling and pain to left calf x 1 day, pt home health nurse out of town so was told to come to ER. Pt with no other symptoms or concerns

## 2017-12-31 NOTE — Unmapped (Signed)
Consent signed and on chart.

## 2017-12-31 NOTE — Unmapped (Signed)
Call to dietary services, ordered patient dinner tray.

## 2017-12-31 NOTE — Unmapped (Signed)
[facility]  Emergency Department Provider Note      ED Clinical Impression     Final diagnoses:   Cellulitis of left lower extremity (Primary)   Anemia, unspecified type       Initial Impression, ED Course, Assessment and Plan     Impression: [name] [name] is a 84 y.o. 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 Concerned for cellulitis, lower suspicion for DVT given she is on Eliquis but will obtain PVL ultrasound, CBC, BMP. Will give ancef.     2:14 PM  Unfortunately do not have PVL ultrasound at [facility] on Sundays. Still pending CBC and BMP. Discussed with patient that if her blood work looks reassuring, we will discharge her home on antibiotics for cellulitis and she can return to the ED in the morning for a PVL ultrasound.     3:21 PM  Patient CBC has resulted and her hemoglobin has dropped from prior with a noted hemoglobin of 7.  She endorses generalized weakness that has been progressively worse.  Given her cellulitis and anemia, we will transfuse and admit.    Additional Medical Decision Making     I have reviewed the vital signs and the nursing notes. Labs and radiology results that were available during my care of the patient were independently reviewed by me and considered in my medical decision making. I independently visualized the radiology images. I reviewed the patient's prior medical records (ED note from 9/11).         Portions of this record have been created using Scientist, clinical (histocompatibility and immunogenetics). Dictation errors have been sought, but may not have been identified and corrected.  ____________________________________________       History     Chief Complaint  Leg Swelling      HPI   [name] [name] is a 84 y.o. 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 Patient had sudden onset of left calf pain and swelling 2 days ago. Son reports that her pain is worsened with any movement.Son reports that patient fell two months ago and suffered a laceration on her left anterior lower leg. She was evaluated in this ED (11/08/17) and the laceration was repaired with sutures but son reports he does not think it ever healed fully. She denies any fevers, nausea, or vomiting. Patient has a home health nurse; she has not documented any fevers recently. Son reports that with recent medication changes, her blood sugar has been running low in the morning but seems to normalize during the day. Patient lives with her two teenage grand-sons and daughter-in-law. Son reports that her blood pressure typically runs low in the 90s/60s range.       Past Medical History:   Diagnosis Date   ??? Atrial fibrillation (CMS-HCC)    ??? Cancer (CMS-HCC)    ??? Diabetes mellitus (CMS-HCC)    ??? Hypertension    ??? Metastatic lung cancer (metastasis from lung to other site) (CMS-HCC)    ??? Pulmonary embolism (CMS-HCC)        Patient Active Problem List   Diagnosis   ??? SOB (shortness of breath)   ??? Hypoxemia   ??? Acute pulmonary embolism (CMS-HCC)   ??? HTN (hypertension)   ???  Diabetes mellitus, type 2 (CMS-HCC)   ??? Metastatic lung cancer (metastasis from lung to other site), unspecified laterality (CMS-HCC)   ??? Addison anemia   ??? Lipoma of back   ??? Weight loss   ??? Lump of breast, right   ??? Pernicious anemia   ??? Anemia   ??? Need for shingles vaccine   ??? Pleural effusion   ??? Chest pain on breathing   ??? T12 compression fracture (CMS-HCC)   ??? Dyspepsia   ??? Thyroid nodule   ??? Hematuria   ??? Pressure injury of sacral region, stage 2 (CMS-HCC)   ??? Visit for suture removal   ??? Knee laceration, left, subsequent encounter   ??? UTI symptoms       Past Surgical History:   Procedure Laterality Date   ??? CHG RAD GUIDED,PERCUT DRAINAGE,W/CATH PLACE Right 06/19/2017    Procedure: RADIOLOGICAL GUIDANCE, FOR PERCUTANEOUS DRAINAGE, WITH PLACEMENT OF CATHETER, RAD S&I;  Surgeon: Sherwood Gambler, MD;  Location: [facility]ON[facility] PROCEDURE LAB [facility];  Service: Pulmonary   ??? CHOLECYSTECTOMY     ??? HYSTERECTOMY     ??? neck surgey     ??? PR INSERTION INDWELLING TUNNELED PLEURAL CATHETER Bilateral 06/19/2017    Procedure: INSERTION OF INDWELLING TUNNELED PLEURAL CATHETER WITH CUFF WITH MODERATE SEDATION;  Surgeon: Sherwood Gambler, MD;  Location: [facility] PROCEDURE LAB [facility];  Service: Pulmonary   ??? PR INSERTION INDWELLING TUNNELED PLEURAL CATHETER Left 10/26/2017    Procedure: INSERTION OF INDWELLING TUNNELED PLEURAL CATHETER WITH CUFF;  Surgeon: Mercy Moore, MD;  Location: MAIN OR [facility];  Service: Pulmonary   ??? PR MOD SED SAME PHYS/QHP EACH ADDL 15 MINS  06/19/2017    Procedure: MODERATE SEDATION SERVICES PROVIDED BY SAME PHYSICIAN OR OTHER QUALIFIED HEALTH CARE PROFESSIONAL PERFORMING THE DIAGNOSTIC OR THERAPEUTIC SERVICE THAT SEDATION SUPPORTS, EACH ADDITIONAL 15 MINS;  Surgeon: Sherwood Gambler, MD;  Location: [facility]ON[facility] PROCEDURE LAB [facility];  Service: Pulmonary   ??? PR MOD SED SAME PHYS/QHP INITIAL 15 MINS 5/> YRS  06/19/2017    Procedure: MODERATE SEDATION SERVICES PROVIDED BY SAME PHYSICIAN OR OTHER QUALIFIED HC PROFESSIONAL PERFORMING THE DIAGNOSTIC OR THERAPEUTIC SERVICE THAT SEDATION SUPPORTS, INIT 15 MINS, PT AGE 22 YEARS OR OLDER;  Surgeon: Sherwood Gambler, MD;  Location: BRON[facility] PROCEDURE LAB [facility];  Service: Pulmonary         Current Facility-Administered Medications:   ???  cephalexin (KEFLEX) capsule 500 mg, 500 mg, Oral, Q6H SCH, Radene Gunning, MD, 500 mg at 01/01/18 0035  ???  dextrose 50 % in water (D50W) 50 % solution 12.5 g, 12.5 g, Intravenous, Q10 Min PRN, Radene Gunning, MD  ???  furosemide (LASIX) tablet 20 mg, 20 mg, Oral, BID, Radene Gunning, MD  ???  insulin lispro (HumaLOG) injection 0-12 Units, 0-12 Units, Subcutaneous, ACHS, Radene Gunning, MD  ???  magnesium oxide (MAG-OX) tablet 800 mg, 800 mg, Oral, Daily, Lyndel Safe, MD  ???  pantoprazole (PROTONIX) EC tablet 40 mg, 40 mg, Oral, Daily, Radene Gunning, MD, 40 mg at 12/31/17 2034  ???  potassium chloride (KLOR-CON) CR tablet 20 mEq, 20 mEq, Oral, Daily, Radene Gunning, MD, 20 mEq at 12/31/17 2034  ???  sodium chloride (NS) 0.9 % infusion, , Intravenous, Continuous, Radene Gunning, MD    Allergies  Sulfasalazine    Family History   Problem Relation Age of Onset   ??? Cancer Brother         lung   ???  Cancer Daughter         lung   ??? Cancer Daughter         breast       Social History  Social History     Tobacco Use   ??? Smoking status: Never Smoker   ??? Smokeless tobacco: Never Used   Substance Use Topics   ??? Alcohol use: No   ??? Drug use: Never       Review of Systems  Constitutional: Negative for fever.  Eyes: Negative for visual changes.  ENT: Negative for sore throat.  Cardiovascular: Negative for chest pain.  Respiratory: Negative for shortness of breath.  Gastrointestinal: Negative for abdominal pain, vomiting or diarrhea.  Genitourinary: Negative for dysuria.   Musculoskeletal: Positive for left calf pain and swelling. Negative for back pain.  Skin: Negative for rash.  Neurological: Negative for headaches, focal weakness or numbness.      Physical Exam     ED Triage Vitals   Enc Vitals Group      BP 12/31/17 1214 92/58      Pulse --       SpO2 Pulse 12/31/17 1158 78      Resp 12/31/17 1158 18      Temp 12/31/17 1158 36.7 ??C (98.1 ??F)      Temp src --       SpO2 12/31/17 1158 100 %      Weight --      Constitutional: Alert and oriented. Well appearing and in no distress.  Eyes: Conjunctivae are normal. PERRL.  ENT       Head: Normocephalic and atraumatic.       Nose: No congestion.       Mouth/Throat: Mucous membranes are moist. Oropharynx clear.        Neck: No stridor.  Hematological/Lymphatic/Immunilogical: No cervical lymphadenopathy. Cardiovascular: Normal rate, irregular rhythm. Normal and symmetric distal pulses are present in all extremities.  Respiratory: Normal respiratory effort. Breath sounds are normal. Bilateral PleurX drains in place.  Gastrointestinal: Soft and nontender. There is no CVA tenderness.  Musculoskeletal: Normal ROM Of left hip and knee. Tenderness to posterior left calf and knee. Erythema noted to left anterior shin. 3+ edema to LLE. 1+ edema to RLE. NV intact distally. Normal range of motion in all extremities.          Neurologic: Normal speech and language. No gross focal neurologic deficits are appreciated.  Skin: Skin is warm, dry and intact. No rash noted.  Psychiatric: Mood and affect are normal. Speech and behavior are normal.          Radiology     PVL Venous Duplex Lower Extremity Left    (Results Pending)   Echocardiogram W Colorflow Spectral Doppler    (Results Pending)       Documentation assistance was provided by Daine Gravel, Scribe, on December 31, 2017 at 12:35 PM for Precious Gilding, MD.    Documentation assistance was provided by the scribe in my presence.  The documentation recorded by the scribe has been reviewed by me and accurately reflects the services I personally performed.      Precious Gilding, MD  December 31, 2017 12:35 PM     Precious Gilding, MD  01/01/18 [id number]

## 2018-01-01 DIAGNOSIS — M79662 Pain in left lower leg: Principal | ICD-10-CM

## 2018-01-01 DIAGNOSIS — M7989 Other specified soft tissue disorders: Secondary | ICD-10-CM | POA: Diagnosis not present

## 2018-01-01 DIAGNOSIS — L03116 Cellulitis of left lower limb: Secondary | ICD-10-CM | POA: Diagnosis not present

## 2018-01-01 DIAGNOSIS — I313 Pericardial effusion (noninflammatory): Secondary | ICD-10-CM | POA: Diagnosis not present

## 2018-01-01 DIAGNOSIS — D509 Iron deficiency anemia, unspecified: Secondary | ICD-10-CM | POA: Diagnosis not present

## 2018-01-01 DIAGNOSIS — I351 Nonrheumatic aortic (valve) insufficiency: Secondary | ICD-10-CM | POA: Diagnosis not present

## 2018-01-01 DIAGNOSIS — I361 Nonrheumatic tricuspid (valve) insufficiency: Secondary | ICD-10-CM | POA: Diagnosis not present

## 2018-01-01 DIAGNOSIS — I340 Nonrheumatic mitral (valve) insufficiency: Secondary | ICD-10-CM | POA: Diagnosis not present

## 2018-01-01 DIAGNOSIS — I517 Cardiomegaly: Secondary | ICD-10-CM | POA: Diagnosis not present

## 2018-01-01 DIAGNOSIS — R600 Localized edema: Secondary | ICD-10-CM | POA: Diagnosis not present

## 2018-01-01 DIAGNOSIS — D638 Anemia in other chronic diseases classified elsewhere: Secondary | ICD-10-CM | POA: Diagnosis not present

## 2018-01-01 LAB — CBC
HEMATOCRIT: 29.7 % — ABNORMAL LOW (ref 36.0–46.0)
HEMOGLOBIN: 7 g/dL — ABNORMAL LOW (ref 13.5–16.0)
HEMOGLOBIN: 10.4 g/dL — ABNORMAL LOW (ref 13.5–16.0)
MEAN CORPUSCULAR HEMOGLOBIN CONC: 35.8 g/dL (ref 31.0–37.0)
MEAN CORPUSCULAR HEMOGLOBIN CONC: 34.9 g/dL (ref 31.0–37.0)
MEAN CORPUSCULAR VOLUME: 68.2 fL — ABNORMAL LOW (ref 80.0–100.0)
MEAN CORPUSCULAR VOLUME: 76.8 fL — ABNORMAL LOW (ref 80.0–100.0)
MEAN PLATELET VOLUME: 7.2 fL (ref 7.0–10.0)
MEAN PLATELET VOLUME: 7.7 fL (ref 7.0–10.0)
PLATELET COUNT: 271 10*9/L (ref 150–440)
PLATELET COUNT: 293 10*9/L (ref 150–440)
RED BLOOD CELL COUNT: 2.85 10*12/L — ABNORMAL LOW (ref 4.00–5.20)
RED BLOOD CELL COUNT: 3.86 10*12/L — ABNORMAL LOW (ref 4.00–5.20)
RED CELL DISTRIBUTION WIDTH: 20.6 % — ABNORMAL HIGH (ref 12.0–15.0)
RED CELL DISTRIBUTION WIDTH: 24.9 % — ABNORMAL HIGH (ref 12.0–15.0)
WBC ADJUSTED: 5.6 10*9/L (ref 4.5–11.0)
WBC ADJUSTED: 5.9 10*9/L (ref 4.5–11.0)

## 2018-01-01 LAB — BASIC METABOLIC PANEL
ANION GAP: 2 mmol/L — ABNORMAL LOW (ref 7–15)
BLOOD UREA NITROGEN: 24 mg/dL — ABNORMAL HIGH (ref 7–21)
BUN / CREAT RATIO: 32
CALCIUM: 7.7 mg/dL — ABNORMAL LOW (ref 8.5–10.2)
CHLORIDE: 110 mmol/L — ABNORMAL HIGH (ref 98–107)
CO2: 25 mmol/L (ref 22.0–30.0)
CREATININE: 0.76 mg/dL (ref 0.60–1.00)
EGFR CKD-EPI AA FEMALE: 83 mL/min/{1.73_m2} (ref >=60)
GLUCOSE RANDOM: 213 mg/dL — ABNORMAL HIGH (ref 65–179)
POTASSIUM: 4.5 mmol/L (ref 3.5–5.0)

## 2018-01-01 LAB — HEPATIC FUNCTION PANEL
ALBUMIN: 1.7 g/dL — ABNORMAL LOW (ref 3.5–5.0)
ALT (SGPT): 21 U/L (ref <35)
BILIRUBIN DIRECT: <0.1 mg/dL (ref 0.00–0.40)

## 2018-01-01 LAB — MAGNESIUM: Magnesium:MCnc:Pt:Ser/Plas:Qn:: 1.3 — ABNORMAL LOW

## 2018-01-01 LAB — PROTEIN TOTAL: Protein:MCnc:Pt:Ser/Plas:Qn:: 4.4 — ABNORMAL LOW

## 2018-01-01 LAB — ESTIMATED AVERAGE GLUCOSE: Estimated average glucose:MCnc:Pt:Bld:Qn:Estimated from glycated hemoglobin: 137

## 2018-01-01 LAB — WBC ADJUSTED: Lab: 5.6

## 2018-01-01 LAB — LACTATE DEHYDROGENASE: Lactate dehydrogenase:CCnc:Pt:Ser/Plas:Qn:: 730 — ABNORMAL HIGH

## 2018-01-01 LAB — CO2: Carbon dioxide:SCnc:Pt:Ser/Plas:Qn:: 25

## 2018-01-01 LAB — RED CELL DISTRIBUTION WIDTH: Lab: 24.9 — ABNORMAL HIGH

## 2018-01-01 LAB — HEMOGLOBIN A1C: HEMOGLOBIN A1C: 6.4 % — ABNORMAL HIGH (ref 4.8–5.6)

## 2018-01-01 NOTE — Unmapped (Signed)
General Medicine History and Physical    Assessment/Plan:    Active Problems:    * No active hospital problems. *    [name] [name] is a 84 y.o. female with PMHx of NSCLC (Stage IV, currently on crizotinib) c/b b/l malignant effusions s/p b/l pleurX catheters, IDDM, PE, AF (on eliquis) presenting with unilateral lower extremity erythema and tenderness found to have a decrease in her hemoglobin by 1.8 grams over 5 weeks.    #Anemia of unclear etiology.  Differential diagnosis includes anemia of chronic disease, iron deficiency anemia, acute blood loss, bone marrow suppression, malignancy (either metastatic affecting the bone marrow are primary in the bone marrow) and hemolysis.  The patient is on a biologic called crizotinib which, upon my review of the literature, does not seem to be associated with suppression of erythropoiesis.  --Transfusing 2 units of pRBCs given symptomatic anemia with a hemoglobin of 7.2  --LFTs  --Reticulocyte count  --Iron panel  --Ferritin  --LDH  --Haptoglobin  --Holding apixaban  --NPO at midnight  --CTM PleurX drains for bloody output  --Since the patient is not having frank evidence of bleeding in her fecal occult blood test was negative, I will not start IV PPI but rather give her oral Protonix    #Left lower extremity cellulitis: The patient appears to have a cellulitic lesion on her left lower extremity.  She has a demarcated erythematous area which feels warm to the touch.  --Patient received 1 dose of cefazolin in the ED  --Cephalexin 500 mg every 6 hours x5 days    #Left lower extremity edema: The patient's left lower extremity is more edematous than the right.  While this may be related to her cellulitis, given her history of malignancy I would like to ensure that she has not failed Eliquis therapy and developed a DVT.  Furthermore she may have obstruction causing unilateral lower extremity edema.  --PVL left lower extremity  --Home dose of PO 20 mg Lasix  --TTE #Pericardial effusion: Patient noted to have circumferential moderate pericardial effusion on echocardiogram in March 2019.  I do not see any follow-up of this effusion.  I would favor repeating the echocardiogram in case it has gotten bigger in size.  She may also have a malignant, bloody, pericardial effusion that has accumulated over time.  --Transthoracic echocardiogram    #Atrial Fibrillation unclear if this is paroxysmal or chronic.  Most recent EKG demonstrated atrial fibrillation but EKGs prior to that demonstrate sinus rhythm.  Her EKG from September 4 is difficult to interpret as atrial fibrillation given what appear to be occasional P waves preceding QRS complexes  --ECG  --Holding Eliquis    #History of pulmonary embolism: Dating back to April 30, 2017.  CT chest at that time described pulmonary emboli in the right upper lobe segmental and subsegmental pulmonary arteries.   --Holding Eliquis given concern for bleeding    #Stage IV NSCLC c/b b/l malignant effusions s/p b/l pleurX catheters  --NAI  --CTM drain output    #IDDM: Patient's son states that she takes 8 to 10 units of NovoLog twice daily.  Her blood sugars have fluctuated between 61 and 300 over the past 2 weeks.  Given this wide range, I will elect to assess her insulin needs by starting her on sliding scale insulin.  --SSI    #FEN/GI:  --NPO at midnight  --SCDs for DVT prophylaxis given concern for bleed  --No indication for active fluids  --Replete electrolytes as needed  --  Pantoprazole for GI prophylaxis  ___________________________________________________________________    Chief Complaint:  Chief Complaint   Patient presents with   ??? Leg Swelling     No Principal Problem: There is no principal problem currently on the Problem List. Please update the Problem List and refresh.    HPI:  [name] [name] is a 84 y.o. female with PMHx of NSCLC (Stage IV, currently on crizotinib) c/b b/l malignant effusions s/p b/l pleurX catheters, IDDM, PE, AF (on eliquis) presenting with unilateral lower extremity erythema and tenderness found to have a decrease in her hemoglobin by 1.8 grams over 5 weeks.    The patient's history is obtained from both the patient and her son who lives nearby. Together, they endorse that the patient has been having approximately 2-3 days of LLE pain. Her son states that she elevates her legs at night and has been having worsening pain and discomfort. He denies any fevers at home but does endorse that she intermittently has felt chilled. She occasionally has blood in her stool - as recently as yesterday, there was streaking of blood along the side of her bowel movements and blood on her toilet paper. The son denies any blood mixed in with her stool or melanic stools. Her stools are brown and she has a bowel movement every other day. The patient and her son deny any bloody pleurX output, and they empty the drains every other day.    While the patient states that she has been feeling good, her son endorses recent fatigue over the past couple of weeks. She walks with a walker at baseline but has been weaker yesterday. She has had increased lethargy, moving less and getting up and about and out less, to the extent that she has developed a pressure ulcer. She has had a recent mechanical fall, tripping over her rug, and sustained a L knee laceration. Her son comes to take care of her several days per week and takes her out at baseline, otherwise she lives with her daughter and 2 nephews. Her recent weakness has been intermittent, as yesterday she was able to ambulate to the bathroom without a walker, according to her son. She has been having intermittent exertional dyspnea, with shortness of breath when pushing her walker. She denies any recent appetite changes, chest pain or abdominal pain. She does not take NSAID's. She denies any recent dizziness, lightheadedness, syncope or pre-syncope. She denies any smoking, drinking or illicit drug use. She denies any thigh swelling or bruising recently. No recent cough.    Allergies:  Sulfasalazine    Medications:   Prior to Admission medications    Medication Dose, Route, Frequency   blood sugar diagnostic Strp Check 4x/day as instructed   blood-glucose meter kit Check BGs 4x/day as instructed   calcium carbonate-vitamin D3 (CALTRATE 600 + D) 600 mg (1,500 mg)-800 unit Chew 1 tablet, Oral, 2 times a day (standard)   cephalexin (KEFLEX) 500 MG capsule 500 mg, Oral, 4 times a day   crizotinib (XALKORI) 250 mg capsule 250 mg, Oral, 2 times a day (standard)   ELIQUIS 5 mg Tab 5 mg, Oral, 2 times a day (standard), TAKE 1 TABLET (5 MG TOTAL) BY MOUTH TWO (2) TIMES A DAY.   furosemide (LASIX) 20 MG tablet 20 mg, Oral, Daily PRN   insulin aspart protamine-insulin aspart (NOVOLOG MIX 70-30FLEXPEN U-100) 100 unit/mL (70-30) injection pen 10 Units, Subcutaneous, 2 times a day   lidocaine 4 % patch 1 patch, Transdermal, Daily (standard)  losartan (COZAAR) 50 MG tablet 50 mg, Oral, Daily (standard)   metFORMIN (GLUCOPHAGE-XR) 500 MG 24 hr tablet 1,000 mg, Oral, 2 times a day   ONETOUCH ULTRA2 METER Misc CHECK BGS 4X/DAY AS INSTRUCTED   pen needle, diabetic (BD ULTRA-FINE SHORT PEN NEEDLE) 31 gauge x 5/16 Ndle USE AS DIRECTED. USE 4 NEEDLES A DAY WITH DIABETIC MEDICATION PENS AS DIRECTED   potassium chloride SA (K-DUR,KLOR-CON) 20 MEQ tablet 20 mEq, Oral, Daily (standard)   ranitidine (ZANTAC) 150 MG tablet 150 mg, Oral, 2 times a day (standard)   simethicone (GAS-X) 80 MG chewable tablet 80 mg, Oral, 4 times daily PRN       Medical History:  Past Medical History:   Diagnosis Date   ??? Atrial fibrillation (CMS-HCC)    ??? Cancer (CMS-HCC)    ??? Diabetes mellitus (CMS-HCC)    ??? Hypertension    ??? Metastatic lung cancer (metastasis from lung to other site) (CMS-HCC)    ??? Pulmonary embolism (CMS-HCC)        Surgical History:  Past Surgical History:   Procedure Laterality Date   ??? CHG RAD GUIDED,PERCUT DRAINAGE,W/CATH PLACE Right 06/19/2017    Procedure: RADIOLOGICAL GUIDANCE, FOR PERCUTANEOUS DRAINAGE, WITH PLACEMENT OF CATHETER, RAD S&I;  Surgeon: Sherwood Gambler, MD;  Location: [facility]ON[facility] PROCEDURE LAB [facility];  Service: Pulmonary   ??? CHOLECYSTECTOMY     ??? HYSTERECTOMY     ??? neck surgey     ??? PR INSERTION INDWELLING TUNNELED PLEURAL CATHETER Bilateral 06/19/2017    Procedure: INSERTION OF INDWELLING TUNNELED PLEURAL CATHETER WITH CUFF WITH MODERATE SEDATION;  Surgeon: Sherwood Gambler, MD;  Location: [facility] PROCED[facility] LAB [facility];  Service: Pulmonary   ??? PR INSERTION INDWELLING TUNNELED PLEURAL CATHETER Left 10/26/2017    Procedure: INSERTION OF INDWELLING TUNNELED PLEURAL CATHETER WITH CUFF;  Surgeon: Mercy Moore, MD;  Location: MAIN OR [facility];  Service: Pulmonary   ??? PR MOD SED SAME PHYS/QHP EACH ADDL 15 MINS  06/19/2017    Procedure: MODERATE SEDATION SERVICES PROVIDED BY SAME PHYSICIAN OR OTHER QUALIFIED HEALTH CARE PROFESSIONAL PERFORMING THE DIAGNOSTIC OR THERAPEUTIC SERVICE THAT SEDATION SUPPORTS, EACH ADDITIONAL 15 MINS;  Surgeon: Sherwood Gambler, MD;  Location: [facility] PROCEDURE LAB [facility];  Service: Pulmonary   ??? PR MOD SED SAME PHYS/QHP INITIAL 15 MINS 5/> YRS  06/19/2017    Procedure: MODERATE SEDATION SERVICES PROVIDED BY SAME PHYSICIAN OR OTHER QUALIFIED HC PROFESSIONAL PERFORMING THE DIAGNOSTIC OR THERAPEUTIC SERVICE THAT SEDATION SUPPORTS, INIT 15 MINS, PT AGE 41 YEARS OR OLDER;  Surgeon: Sherwood Gambler, MD;  Location: [facility] PROCEDURE LAB [facility];  Service: Pulmonary       Social History:  Social History     Socioeconomic History   ??? Marital status: Widowed     Spouse name: Not on file   ??? Number of children: Not on file   ??? Years of education: Not on file   ??? Highest education level: Not on file   Occupational History   ??? Not on file   Social Needs   ??? Financial resource strain: Not on file   ??? Food insecurity:     Worry: Not on file     Inability: Not on file   ??? Transportation needs:     Medical: Not on file Non-medical: Not on file   Tobacco Use   ??? Smoking status: Never Smoker   ??? Smokeless tobacco: Never Used   Substance and Sexual Activity   ??? Alcohol use: No   ???  Drug use: Never   ??? Sexual activity: Not on file   Lifestyle   ??? Physical activity:     Days per week: Not on file     Minutes per session: Not on file   ??? Stress: Not on file   Relationships   ??? Social connections:     Talks on phone: Not on file     Gets together: Not on file     Attends religious service: Not on file     Active member of club or organization: Not on file     Attends meetings of clubs or organizations: Not on file     Relationship status: Not on file   Other Topics Concern   ??? Do you use sunscreen? No   ??? Tanning bed use? No   ??? Are you easily burned? No   ??? Excessive sun exposure? No   ??? Blistering sunburns? No   Social History Narrative   ??? Not on file       Family History:  Family History   Problem Relation Age of Onset   ??? Cancer Brother         lung   ??? Cancer Daughter         lung   ??? Cancer Daughter         breast       Review of Systems:  10 systems reviewed and are negative unless otherwise mentioned in HPI    Labs/Studies:  Labs and Studies from the last 24hrs per EMR and Reviewed    Physical Exam:  Temp:  [36.7 ??C-36.9 ??C] 36.9 ??C  Heart Rate:  [68-79] 79  SpO2 Pulse:  [78-79] 79  Resp:  [16-18] 16  BP: (92-102)/(58-68) 96/60  SpO2:  [96 %-100 %] 96 %    Gen: NAD  HEENT: Atraumatic, normocephalic, nares clear anteriorly, slight R-eyelid facial droop that the son states is chronic, EOMI, MMM, PERRL  CV: Irregularly irregular, no m/r/g, S1/S2  Pulm: LCTAB  Abd: Soft, NDNT, no evidence of flank bruising  Ext: Warm, well perfused, LLE erythematous compared to right circumferentially around the lower shin, bilateral lower extremities with similar circumference. 2+ pitting edema to the mid-shin.  Neuro: No focal neurological deficits. 5/5 strength in upper extremities.  Psych: Appropriate mood and affect

## 2018-01-01 NOTE — Unmapped (Signed)
ADM MD at bedside.

## 2018-01-01 NOTE — Unmapped (Signed)
Pt alert and in NAD at time of transport. All belongings given to family at time of transport.

## 2018-01-01 NOTE — Unmapped (Signed)
Daily Progress Note    24hr Events: Overnight, the patient was agitated and aggressive towards staff.  She refused a type and screen and did not receive PRBCs.  She was found to have 750 cc retained in her bladder.  Following I&O, the patient was more calm.  Administration of PRBCs was delayed until today secondary to delirium.  States that she feels well and appears less confused this morning.  Hemoglobin stable.    Assessment/Plan:  ??  [name] [name] is a 84 y.o. female with PMHx of NSCLC (Stage IV, currently on crizotinib) c/b b/l malignant effusions s/p b/l pleurX catheters, IDDM, PE, AF (on eliquis) presenting with unilateral lower extremity erythema and tenderness found to have a decrease in her hemoglobin by 1.8 grams over 5 weeks.  ??  1.  Combined iron deficiency anemia and anemia of chronic disease: Hemoglobin stable at 7.0 mg/dL.  Haptoglobin and LDH not indicative of significant hemolysis.  Iron studies appear to support iron deficiency and anemia of chronic disease (elevated ferritin).  Reticulocyte index of approximately 1, indicating a hypoproliferative marrow response.  Stable hemoglobin likely to represent acute GI source.  Plan to administer PRBCs and future iron supplementation.  ?? Transfuse 2U of pRBCs  ?? Holding apixaban  ?? CBC QD  ??  2. Left lower extremity cellulitis: Erythema improved with less pain. Patient received 1 dose of cefazolin in the ED.  ?? Cephalexin 500 mg every 6 hours x5 days  ??  3. Left lower extremity edema: PVLs negative for thrombus. Likely related to cellulitis.  ?? PO 20 mg Lasix  ??  4. Pericardial effusion: Circumferential moderate pericardial effusion on echocardiogram (3/19). Small pericardial effusion noted on repeat TTE. Unlikely to be contributing to symptoms.  ??  5. Atrial Fibrillation unclear if this is paroxysmal or chronic.  Most recent EKG demonstrated atrial fibrillation but EKGs prior to that demonstrate sinus rhythm.   ?? ECG  ?? Holding Eliquis  ??  6. History of pulmonary embolism: Dating back to April 30, 2017.  CT chest at that time described pulmonary emboli in the right upper lobe segmental and subsegmental pulmonary arteries.   ?? Holding Eliquis given concern for bleeding  ??  7. Stage IV NSCLC c/b b/l malignant effusions s/p b/l pleurX catheters  ?? CTM drain output  ??  8. IDDM: HbA1c 8.1% (04/2017). 8-10U of NovoLog twice daily. Blood sugars have recently fluctuated between 61 and 300 recently. Plan to do SSI to determine insulin needs.  ?? SSI  ?? Repeat HbA1c  ??  FEN/GI:  --Regular diet  --SCDs for DVT prophylaxis given concern for bleed  --No indication for active fluids  --Replete electrolytes as needed  --Pantoprazole for GI prophylaxis  ___________________________________________________________________    Subjective:  No acute events overnight    Labs/Studies:  Labs per EMR and Reviewed (last 24hrs) and   CBC - Results in Past 2 Days  Result Component Current Result   WBC 5.6 (01/01/2018)   RBC 2.85 (L) (01/01/2018)   HGB 7.0 (L) (01/01/2018)   HCT 19.5 (L) (01/01/2018)   MCV 68.2 (L) (01/01/2018)   MCH 24.5 (L) (01/01/2018)   MCHC 35.8 (01/01/2018)   MPV 7.2 (01/01/2018)   Platelet 271 (01/01/2018)     BMP - Results in Past 2 Days  Result Component Current Result   Sodium 137 (01/01/2018)   Potassium 4.5 (01/01/2018)   Chloride 110 (H) (01/01/2018)   CO2 25.0 (01/01/2018)   BUN 24 (H) (01/01/2018)  Creatinine 0.76 (01/01/2018)   EST.GFR (MDRD) Not in Time Range   Glucose 213 (H) (01/01/2018)       Wound 12/14/17 Pressure Injury Sacrum Mid Stage 2 (Active)   Dressing Status      Changed 12/31/2017  6:50 PM   State of Healing Early/partial granulation 12/31/2017  6:50 PM   Wound Bed Yellow;Pink 12/31/2017  6:50 PM   Odor Mild 12/31/2017  6:50 PM   Peri-wound Assessment      Clean;Dry;Intact 12/31/2017  6:50 PM   Exudate Type      Sero-sanguineous 12/31/2017  6:50 PM   Exudate Amnt      Scant 12/31/2017  6:50 PM   Treatments Cleansed/Irrigation 12/31/2017  6:50 PM   Dressing Other (Comment) 12/31/2017  6:50 PM       Wound 12/14/17 Skin Tear Knee Left (Active)   Dressing Status      Intact/not removed 12/31/2017  6:50 PM   Odor None 12/14/2017  2:30 PM   Treatments Cleansed/Irrigation 12/14/2017  2:30 PM   Dressing Open to air 12/14/2017  2:30 PM     Objective:  Temp:  [35.7 ??C-36.9 ??C] 36.3 ??C  Heart Rate:  [63-83] 63  Resp:  [16-18] 16  BP: (86-122)/(40-68) 122/68  SpO2:  [94 %-98 %] 96 %    Constitutional: NAD. Thin appearing.  C.V.: Irregular rate and rhythm. No murmur.  Respiratory: No wheeze or crackles.  G.I.: Soft, non-tender, non-distended.  MSK: No pitting edema.  Skin: Warm and dry. No rashes.

## 2018-01-01 NOTE — Unmapped (Signed)
Care Management  Initial Transition Planning Assessment              General  Care Manager assessed the patient by : In person interview with patient, Medical record review  Orientation Level: Oriented X4  Who provides care at home?: N/A   Type of Residence: Mailing Address:  [name]  [place] [address]  Contacts: Primary Emergency Contact: Cooper Render [place]  Home Phone: [phone]  Mobile Phone: [phone]  Relation: Son  Secondary Emergency Contact: Viviann Spare  Mobile Phone: [phone]  Relation: Daughter  Patient Phone Number: [phone]         Medical Provider(s): [name], MD  Reason for Admission: Admitting Diagnosis:  No admission diagnoses are documented for this encounter.  Past Medical History:   has a past medical history of Atrial fibrillation (CMS-HCC), Cancer (CMS-HCC), Diabetes mellitus (CMS-HCC), Hypertension, Metastatic lung cancer (metastasis from lung to other site) (CMS-HCC), and Pulmonary embolism (CMS-HCC).  Past Surgical History:   has a past surgical history that includes Hysterectomy; Cholecystectomy; neck surgey; pr insertion indwelling tunneled pleural catheter (Bilateral, 06/19/2017); chg rad guided,percut drainage,w/cath place (Right, 06/19/2017); pr mod sed same phys/qhp each addl 15 mins (06/19/2017); pr mod sed same phys/qhp initial 15 mins 5/> yrs (06/19/2017); and pr insertion indwelling tunneled pleural catheter (Left, 10/26/2017).   Previous admit date: 05/12/2017    Primary Insurance- Payor: MEDICARE / Plan: MEDICARE PART A AND PART B / Product Type: *No Product type* /   Secondary Insurance ??? None  Prescription Coverage ??? same as above  Preferred Pharmacy - [facility] [phone] - [place], [place] - [address]  [facility]    Transportation home: Private vehicle  Level of function prior to admission: Independent    Contact/Decision Maker     Extended Emergency Contact Information  Primary Emergency Contact: Pitter,Jessie   [place]  Home Phone: [phone]  Mobile Phone: [phone]  Relation: Son  Secondary Emergency Contact: Viviann Spare  Mobile Phone: [phone]  Relation: Daughter    Legal Next of Kin / Guardian / POA / Advance Directives     HCDM (HCPOA): Broberg,Jessie - Son [phone]    Advance Directive (Medical Treatment)  Does patient have an advance directive covering medical treatment?: Patient does not have advance directive covering medical treatment.  Reason patient does not have an advance directive covering medical treatment:: Patient needs follow-up to complete one.  Information provided on advance directive:: No  Patient requests assistance:: No    Patient Information  Lives with: Family members(Pt's 2 grandchildren live with her). Pt's son live next door.    Type of Residence: Private residence     Location/Detail: [place], [place]. Single storey residence with 3 steps to the front and 4 in the back entrance with handrails.     Support Systems: Children, Family Members    Responsibilities/Dependents at home?: Yes (Describe)(12 and 56 yr old grandsons)    Home Care services in place prior to admission?: No     Equipment Currently Used at Home: cane, straight, glucometer, walker, rolling     Currently receiving outpatient dialysis?: No    Financial Information    Need for financial assistance?: No    Social Determinants of Health  Social Determinants of Health were addressed in provider documentation.  Please refer to patient history.    Discharge Needs Assessment  Concerns to be Addressed: care coordination/care conferences, adjustment to diagnosis/illness  Clinical Risk Factors: > 65, Multiple Diagnoses (Chronic), Lives Alone or Absence of Caregiver to Assist with Discharge and Home Care    Barriers to taking medications: No    Prior overnight hospital stay or ED visit in last 90 days: No    Readmission Within the Last 30 Days: no previous admission in last 30 days    Anticipated Changes Related to Illness: inability to care for someone else    Equipment Needed After Discharge: other (see comments)(Per recs)    Discharge Facility/Level of Care Needs: nursing facility, skilled(SNF vs HH)    Readmission  Risk of Unplanned Readmission Score:  %  Readmitted Within the Last 30 Days?   Patient at risk for readmission?: Yes    Discharge Plan  Screen findings are: Discharge planning needs identified or anticipated (Comment).    Expected Discharge Date:      Expected Transfer from Critical Care:      Patient and/or family were provided with choice of facilities / services that are available and appropriate to meet post hospital care needs?: Yes   List choices in order highest to lowest preferred, if applicable. : Will use [facility] again if needed.    Initial Assessment complete?: Yes

## 2018-01-01 NOTE — Unmapped (Signed)
Pt is [name] [name] & O X to her self only. Family in the room at all times. VSS. Denies any pain. Left leg swollen from cellulitis. Pt's HGB 7.2 that needed 2 units of RBC. Pt refused blood drawn for type and screen. Verbally and physically aggressive towards medical staff and family. MD notified and present in the room. After talking to the Son on the phone, MD and son decided to to draw blood and  give RBC in the morning after son's arrival in the hospital. Pt refused to turn at times. Pt had urine retention once and needed I/O cath.  Resting comfortably in bed at this time. Daughter present in the room at all times. Cont to monitor, see flowhseet for full assessment.     Problem: Adult Inpatient Plan of Care  Goal: Plan of Care Review  Outcome: Progressing  Goal: Patient-Specific Goal (Individualization)  Outcome: Progressing  Goal: Absence of Hospital-Acquired Illness or Injury  Outcome: Progressing  Goal: Optimal Comfort and Wellbeing  Outcome: Progressing  Goal: Readiness for Transition of Care  Outcome: Progressing  Goal: Rounds/Family Conference  Outcome: Progressing     Problem: Self-Care Deficit  Goal: Improved Ability to Complete Activities of Daily Living  Outcome: Progressing     Problem: Fall Injury Risk  Goal: Absence of Fall and Fall-Related Injury  Outcome: Progressing     Problem: Skin Injury Risk Increased  Goal: Skin Health and Integrity  Outcome: Progressing     Problem: Infection (Sepsis/Septic Shock)  Goal: Absence of Infection Signs/Symptoms  Outcome: Progressing

## 2018-01-01 NOTE — Unmapped (Signed)
Calling report at this time

## 2018-01-01 NOTE — Unmapped (Addendum)
2-3 days of LLE pain. Leg gets elevated overnight but recently there has been increasing discomfort. No fevers but intermittent chills. Occasional blood in stool. Most recently yesterday; brown stool with bright red blood as a streak on the outside. Blood on toilet paper. No melanic stools. BM's qod. Patient states that she's been feeling good, but caretaker endorses recent fatigue over the past couple of weeks. Walks at home but has been weaker recently. Yesterday, however, she was able to ambulate by herself without the assistance of a walker. Intermittent exertional dyspnea. No appetite changes, no chest pain, no abdominal pain. No NSAID's. No recent blood in pleural drains. No dizziness, lightheadedness, syncope or pre-syncope. No smoking, drinking or drug history. No thigh swelling and no bruising. Has bed sore, recently hasn't been up much but she denies any recent decrease in energy. The son, however, endorses some lethargy and not getting out as much. No recent cough.    PMH: Lung Ca c/b b/l PleurX, IDDM, PE.    PCP Post-Discharge Follow Up Issues:   ***Follow up with oncology palliative care as planned  ***Consider cognitive testing    To contact the discharging attending physician with regard to the patient's hospitalization or follow-up needs, please call: [facility] Division of Geriatrics [phone]).       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 She was diagnosed with lung cancer this year. Prior to that, her family reports that she was very independent. She has a home in the [place] area. She was doing her own shopping and preparing large meals for family. She has had 6 children, 4 living, and has a lot of support from family that has allowed her to continue living at home since her cancer diagnosis. Her daughter came down from [place] in June to live with her to help as her function declined. Her son and other family members live very close to her as well. Her son, [name] [name], is her HCPOA and he manages her finances and organizes her medications. Her daughter reports that she has had more weakness over the last month and is needing more help with getting to the toilet, bathing, and transfers. She uses a walker but every once in a while will walk without any assistive devices. Her family gives her a bed bath most days and twice a week will get her into the shower but [name] [name] sometimes gets angry about the shower.    Her daughter denies any major changes in her memory and does not feel like she is forgetful, except that she occasionally mixes up names of relatives. She states that she can recall recent events without difficulty. In March of this year there was documentation of some mild cognitive disorder but do not see a SLUMS or MOCA documented. She had agitation the first night of this admission and hypoactivity appreciated the following day concerning for delirium, and was CAM positive. On 11/4 she was oriented to self and place but not time, and was not able to spell WORLD backwards or give days of the week backwards (said mercury and hospital), and could not complete SAVEAHEART (squeezed hand for every letter). When asked about visual hallucinations she reports that sometimes she sees things at home that her son does not see. Suspect that she has some cognitive  impairment at baseline, likely related to her brain mets and also possibly some vascular dementia (chronic lacunar infarct seen on CT).     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     *Refer below for the patient's functional / cognitive assessments, and advance care planning*    Admit Date:  Functional Assessment       ADLs:  IADLs:   Feeding: Independent  Dressing: Requires Assistance Ambulation: Requires Assistance  Toileting: Requires Assistance  Bathing: Requires Assistance   Using the phone: Requires Assistance  Shopping: Dependent  Meal preparation: Dependent  Medication mgmt: Dependent  Managing finances: Dependent  Housework: Dependent  Transportation (driving or navigating public transit): Dependent     Living situation: Patient lives in own home with her daughter.     Changes in ADLs during hospitalization: no    Assistive devices: walker    Additional services recommended at discharge: Family feels that they have adequate support at home.    Cognitive Assessment     Delirium Assessment: CAM (Confusion Assessment Method):    On discharge, the patient DID show evidence of delerium. Discharging mental status: ***.      Other cognitive assessment:  SLUMS was not performed as she was CAM positive, but she would benefit from cognitive testing after hospitalization.    Advance Care Planning     Family would like to continue with her current level of care. When asked if she minds coming to the hospital [name] [name] stated it's not too bad. Although she has stated to her family that she does desire to spend as much time at home as possible.     Of note, she has been seen by Palliative Care through the oncology clinic.    The patient has NOT completed a living will, HCPOA or MOST form. .    Code Status: Full Code    Surrogate decision maker: Her son, [name] [name]    Emergency Contact:  Extended Emergency Contact Information  Primary Emergency Contact: [name] [name]   [place]  Home Phone: [phone]  Mobile Phone: [phone]  Relation: Son  Secondary Emergency Contact: [name] [name]  Mobile Phone: [phone]  Relation: Daughter

## 2018-01-02 DIAGNOSIS — D638 Anemia in other chronic diseases classified elsewhere: Secondary | ICD-10-CM | POA: Diagnosis not present

## 2018-01-02 DIAGNOSIS — D509 Iron deficiency anemia, unspecified: Secondary | ICD-10-CM | POA: Diagnosis not present

## 2018-01-02 DIAGNOSIS — L03116 Cellulitis of left lower limb: Secondary | ICD-10-CM | POA: Diagnosis not present

## 2018-01-02 DIAGNOSIS — I313 Pericardial effusion (noninflammatory): Secondary | ICD-10-CM | POA: Diagnosis not present

## 2018-01-02 LAB — BASIC METABOLIC PANEL
ANION GAP: 2 mmol/L — ABNORMAL LOW (ref 7–15)
BLOOD UREA NITROGEN: 29 mg/dL — ABNORMAL HIGH (ref 7–21)
BUN / CREAT RATIO: 31
CALCIUM: 7.9 mg/dL — ABNORMAL LOW (ref 8.5–10.2)
CHLORIDE: 108 mmol/L — ABNORMAL HIGH (ref 98–107)
CREATININE: 0.94 mg/dL (ref 0.60–1.00)
EGFR CKD-EPI AA FEMALE: 64 mL/min/{1.73_m2} (ref >=60)
EGFR CKD-EPI NON-AA FEMALE: 56 mL/min/{1.73_m2} — ABNORMAL LOW (ref >=60)
GLUCOSE RANDOM: 151 mg/dL (ref 65–179)
POTASSIUM: 3.7 mmol/L (ref 3.5–5.0)
SODIUM: 138 mmol/L (ref 135–145)

## 2018-01-02 LAB — CBC
HEMATOCRIT: 27.5 % — ABNORMAL LOW (ref 36.0–46.0)
HEMOGLOBIN: 9.8 g/dL — ABNORMAL LOW (ref 12.0–16.0)
MEAN CORPUSCULAR HEMOGLOBIN CONC: 35.6 g/dL (ref 31.0–37.0)
MEAN CORPUSCULAR HEMOGLOBIN: 26.2 pg (ref 26.0–34.0)
MEAN CORPUSCULAR VOLUME: 73.5 fL — ABNORMAL LOW (ref 80.0–100.0)
MEAN PLATELET VOLUME: 9.3 fL (ref 7.0–10.0)
NUCLEATED RED BLOOD CELLS: 1 /100{WBCs} (ref <=4)
PLATELET COUNT: 241 10*9/L (ref 150–440)
RED CELL DISTRIBUTION WIDTH: 22.1 % — ABNORMAL HIGH (ref 12.0–15.0)

## 2018-01-02 LAB — NUCLEATED RED BLOOD CELLS: Lab: 1

## 2018-01-02 LAB — EGFR CKD-EPI AA FEMALE: Lab: 64

## 2018-01-02 LAB — MAGNESIUM: Magnesium:MCnc:Pt:Ser/Plas:Qn:: 1.7

## 2018-01-02 MED ORDER — CEPHALEXIN 500 MG CAPSULE
ORAL_CAPSULE | Freq: Three times a day (TID) | ORAL | 0 refills | 0 days | Status: SS
Start: 2018-01-02 — End: 2018-01-22

## 2018-01-02 NOTE — Unmapped (Addendum)
Pharmacist Discharge Note  Patient Name: [name] [name]  Reason for admission: unilateral lower extremity erythema and decrease in hemoglobin   Reason for writing this note: new diagnosis with new medication, patient requires medication-related outpatient intervention and/or monitoring    Highlighted medication changes with rationale (if applicable):  - start taking Keflex 500 mg PO every 8 hours through 11/8 for skin and soft tissue infection  - continue crizotinib 250 mg PO BID (on hold while hospitalized) for Stave IV NSCLC  - continue metformin and home insulin regimen with Novolog 70/30 (patient's son had been titrating daily based on need) for T2DM  - stop taking losartan 25 mg PO daily as patient has not take and blood pressure has been low while inpatient, follow up continued need when patient has family medicine appointment on 01/10/18    Medication access:  - No barriers identified   - Patient to be discharged home with new foley catheter   - Patient requests all new medication prescriptions be sent to [facility]    Outpatient follow-up:  [ ]  completion of Keflex prescription and resolution of lower extremity erythema   [ ]  ensure patient restarts crizotinib   [ ]  patient's A1c while inpatient was 6.4, monitor insulin use and need for insulin dose adjustments as patient has had some low morning blood glucose readings at home  [ ]  patient's blood pressure since losartan has been held, restart as appropriate     Okey Regal, PharmD  PGY1 Acute Care Pharmacy Resident    Future Appointments   Date Time Provider Department Center   01/10/2018 12:00 PM [name], MD [facility]   01/12/2018  9:30 AM [facility] [place] [facility]   01/12/2018 10:30 AM HBR CT RM 1 [facility] [place] [facility]   01/16/2018  8:00 AM ADULT [facility]C [facility]   01/16/2018  9:00 AM Molli Barrows, MD [facility]   01/16/2018  9:30 AM Sonny Masters, MD [facility]

## 2018-01-02 NOTE — Unmapped (Signed)
[facility]    Identifying Information:   [name] [name]  [birth year]  [id number]    PCP: [name], MD    Admit date: 12/31/2017    Discharge date: 01/02/2018     Discharge Service: Geriatrics (MDA)    Discharge Attending Physician: [name], MD    Discharge to: Home    Discharge Diagnoses:  Principal Problem:    Iron deficiency anemia  Active Problems:    Anemia    Urinary retention  Resolved Problems:    * No resolved hospital problems. *      Hospital Course:   [name] [name] is a 84 y.o. female with PMHx of NSCLC (Stage IV, currently on crizotinib) c/b b/l malignant effusions s/p b/l pleurX catheters, IDDM, PE, AF (on eliquis) presenting with unilateral lower extremity erythema and tenderness found to have a decrease in her hemoglobin by 1.8 grams over 5 weeks.     #Cellulitis: Patient with erythema and tenderness of the left lower extremity.  Received 1 dose of cefazolin in the ED.  Will continue with Keflex 500 mg 3 times daily for 5-day course.  Concern for potential PE given edema of the lower extremity was ruled out with PVLs.    #Urinary Retention: Patient developed urinary retention while inpatient requiring in and out cath multiple times for postvoid residuals greater than 300.  Urinary retention likely led to stretch related damage of the bladder and thus a Foley catheter was placed for decompression.  Patient does also have poorly controlled diabetes which may be related to neurogenic bladder.  Referral to outpatient neurology for follow-up in 1 to 2 weeks for voiding trial.    #Anemia of Chronic Disease: Found to have a decrease in her hemoglobin by 1.8 grams over 5 weeks likely due to chronic blood loss most likely from GI source.  Patient did have 2 units of packed red blood cells transfused with appropriate response.  Apixaban was held, restarted on discharge.  Laboratory evaluation was not suggestive of hemolytic disease.  Iron studies supported anemia of chronic disease with low iron and low TIBC with an elevated ferritin.    #Cardiovascular History (pericardial effusion, atrial fibrillation): Repeat Echo performed demonstrated normal EF, mild valvular disease and chamber dilation as well as interval decarease in pericardial effusion. Also showed some moderately elevated PASP.     #Diabetes Mellitus: Blood sugars were difficult to control fluctuating between lows of 60 and highs of 300.  Patient was placed on sliding scale insulin and her home insulin regimen was held.  It was resumed on discharge and her son notes that he has been adjusting the regiment according to Accu-Cheks.  Discharged on home regiment with instructions to continue home titration until follow-up with your outpatient provider.      Post Discharge Follow Up Issues:   [ ]  Repeat CBC on follow-up to ensure stability of chronic anemia  [ ]  Reassess losartan and BP, holding on discharge for softer pressures during admission  [ ]  Follow-up on mental status and delirium, improved on discharge but not resolved  [ ]  Ensure Urology follow-up is scheduled for 1-2 weeks for voiding trial and assessment of urinary retention  [ ]  Reassess insulin regimen, variable BG inpatient.      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 She was diagnosed with lung cancer this year. Prior to that, her family reports that she was very independent. She has a home in the [place] area. She was doing her own shopping and preparing large meals for family. She has had 6 children, 4 living, and has a lot of support from family that has allowed her to continue living at home since her cancer diagnosis. Her daughter came down from [place] in June to live with her to help as her function declined. Her son and other family members live very close to her as well. Her son, [name] [name], is her HCPOA and he manages her finances and organizes her medications. Her daughter reports that she has had more weakness over the last month and is needing more help with getting to the toilet, bathing, and transfers. She uses a walker but every once in a while will walk without any assistive devices. Her family gives her a bed bath most days and twice a week will get her into the shower but Ms. Menchaca sometimes gets angry about the shower.  ??  Her daughter denies any major changes in her memory and does not feel like she is forgetful, except that she occasionally mixes up names of relatives. She states that she can recall recent events without difficulty. In March of this year there was documentation of some mild cognitive disorder but do not see a SLUMS or MOCA documented. She had agitation the first night of this admission and hypoactivity appreciated the following day concerning for delirium, and was CAM positive. On 11/4 she was oriented to self and place but not time, and was not able to spell WORLD backwards or give days of the week backwards (said mercury and hospital), and could not complete SAVEAHEART (squeezed hand for every letter). When asked about visual hallucinations she reports that sometimes she sees things at home that her son does not see. Suspect that she has some cognitive impairment at baseline, likely related to her brain mets and also possibly some vascular dementia (chronic lacunar infarct seen on CT).   ??  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   ??  *Refer below for the patient's functional / cognitive assessments, and advance care planning*    Admit Date:  Functional Assessment                             ADLs:  IADLs:   Feeding: Independent  Dressing: Requires Assistance  Ambulation: Requires Assistance  Toileting: Requires Assistance  Bathing: Requires Assistance ?? Using the phone: Requires Assistance  Shopping: Dependent  Meal preparation: Dependent  Medication mgmt: Dependent  Managing finances: Dependent  Housework: Dependent  Transportation (driving or navigating public transit): Dependent   ??  Living situation: Patient lives in own home with her daughter.   ??  Changes in ADLs during hospitalization: no  ??  Assistive devices: walker  ??  Additional services recommended at discharge: Family feels that they have adequate support at home.  ??  Cognitive Assessment   ??  Delirium Assessment: CAM (Confusion Assessment Method):    On discharge, the patient DID show evidence of delerium. Discharging mental status: Improved.    ??  Other cognitive assessment:  SLUMS was not performed as she was CAM positive, but she would benefit from cognitive testing after hospitalization.  ??  Advance Care Planning   ??  Family would like to continue with her current level of care. When asked if she minds coming to the hospital Ms. Sortino stated it's not too bad. Although she has stated to her family that she does desire to spend as much time at home as possible.   ??  Of note, she has been seen by Palliative Care through the oncology clinic.  ??  The patient has NOT completed a living will, HCPOA or MOST form. .  ??  Code Status: Full Code  ??  Surrogate decision maker: Her son, Tagen Brethauer  ??  Emergency Contact:  Extended Emergency Contact Information  Primary Emergency Contact: Corso,Jessie   [place]  Home Phone: [phone]  Mobile Phone: [phone]  Relation: Son  Secondary Emergency Contact: Viviann Spare  Mobile Phone: [phone]  Relation: Daughter    Procedures:  None  _____________________________________________________________________________  Discharge Day Services:  BP 106/58  - Pulse 80  - Temp 35.6 ??C (Oral)  - Resp 18  - SpO2 94%  There were no vitals filed for this visit.    Pt seen on the day of discharge and determined appropriate for discharge.    Condition at Discharge: good    Length of Discharge: I spent greater than 30 mins in the discharge of this patient.  _____________________________________________________________________________  Discharge Medications:     Your Medication List      STOP taking these medications    losartan 25 MG tablet  Commonly known as:  COZAAR        START taking these medications    cephalexin 500 MG capsule  Commonly known as:  KEFLEX  Take 1 capsule (500 mg total) by mouth Three (3) times a day. for 4 days        CONTINUE taking these medications    BD ULTRA-FINE SHORT PEN NEEDLE 31 gauge x 5/16 Ndle  Generic drug:  pen needle, diabetic  USE AS DIRECTED. USE 4 NEEDLES A DAY WITH DIABETIC MEDICATION PENS AS DIRECTED     blood sugar diagnostic Strp  Check 4x/day as instructed     blood-glucose meter kit  Check BGs 4x/day as instructed     ONETOUCH ULTRA2 METER Misc  Generic drug:  blood-glucose meter  CHECK BGS 4X/DAY AS INSTRUCTED     calcium carbonate-vitamin D3 600 mg (1,500 mg)-800 unit Chew  Commonly known as:  CALTRATE 600 PLUS D  Chew 1 tablet Two (2) times a day.     ELIQUIS 5 mg Tab  Generic drug:  apixaban  Take 5 mg by mouth Two (2) times a day. TAKE 1 TABLET (5 MG TOTAL) BY MOUTH TWO (2) TIMES A DAY.     furosemide 20 MG tablet  Commonly known as:  LASIX  Take 20 mg by mouth daily as needed for swelling.     insulin aspart protamine-insulin aspart 100 unit/mL (70-30) injection pen  Commonly known as:  NovoLOG Mix 70-30FlexPen U-100  Inject 0.1 mL (10 Units total) under the skin two (2) times a day.     lidocaine 4 % patch  Place 1 patch on the skin daily.     metFORMIN 500 MG 24 hr tablet  Commonly known as:  GLUCOPHAGE-XR  TAKE 2 TABLETS (1,000 MG TOTAL) BY MOUTH TWO (2) TIMES A DAY.     potassium chloride SA 20 MEQ tablet  Commonly known as:  K-DUR,KLOR-CON  Take 10 mEq by mouth Two (2) times a day. Take 1/2 tablet (10 mEq) BID  ranitidine 150 MG tablet  Commonly known as:  ZANTAC  Take 150 mg by mouth daily.     simethicone 80 MG chewable tablet  Commonly known as:  GAS-X  Chew 1 tablet (80 mg total) 4 (four) times a day as needed for flatulence (gas relief).     XALKORI 250 mg capsule  Generic drug:  crizotinib  Take 1 capsule (250 mg total) by mouth Two (2) times a day.          _____________________________________________________________________________  Pending Test Results (if blank, then none):      Most Recent Labs:  Microbiology Results (last day)     ** No results found for the last 24 hours. **          Lab Results   Component Value Date    WBC 6.6 01/02/2018    HGB 9.8 (L) 01/02/2018    HCT 27.5 (L) 01/02/2018    PLT 241 01/02/2018       Lab Results   Component Value Date    NA 138 01/02/2018    K 3.7 01/02/2018    CL 108 (H) 01/02/2018    CO2 28.0 01/02/2018    BUN 29 (H) 01/02/2018    CREATININE 0.94 01/02/2018    CALCIUM 7.9 (L) 01/02/2018    MG 1.7 01/02/2018       Lab Results   Component Value Date    ALKPHOS 102 01/01/2018    BILITOT 0.4 01/01/2018    BILIDIR <0.10 01/01/2018    PROT 4.4 (L) 01/01/2018    ALBUMIN 1.7 (L) 01/01/2018    ALT 21 01/01/2018    AST 24 01/01/2018       Lab Results   Component Value Date    PT 16.9 (H) 08/10/2017    INR 1.48 08/10/2017    APTT 37.9 08/10/2017     Hospital Radiology:  Echocardiogram W Colorflow Spectral Doppler    Result Date: 01/01/2018  ?? Left ventricular hypertrophy - mild ?? Normal left ventricular systolic function, ejection fraction > 55% ?? Degenerative mitral valve disease ?? Mitral regurgitation - mild ?? Dilated left atrium - moderate ?? Aortic sclerosis ?? Aortic regurgitation - mild ?? Elevated pulmonary artery systolic pressure - moderate ?? Normal right ventricular systolic function ?? Tricuspid regurgitation - moderate ?? Dilated right atrium - mild ?? Pericardial effusion - small      Pvl Venous Duplex Lower Extremity Left    Result Date: 01/02/2018   Peripheral Vascular Lab     [address]   [place], [place] [address]  PVL VENOUS DUPLEX LOWER EXTREMITY LEFT Patient Demographics Pt. Name: [name] [name] Location: [facility] Inpatient MRN:      [id number]             Sex:      F DOB:      1931-01-26          Age:      84 years  Study Information Authorizing         [id number] Tonia Brooms       Performed Time       01/01/2018 9:35:07 Provider Name       [facility]                                 AM Ordering Physician  Junius Creamer    Patient Location     [facility] Accession Number  [id number] UN         Technologist         Angelique Blonder Spivack                                                                RDCS Diagnosis:                                Dispensing optician Ordered Reason For Exam: Eval for DVT Indication: Swelling, Edema Erythema Risk Factors: Immobility. Anticoagulation: (Lovenox). Protocol The major deep veins from the inguinal ligament to the ankle are assessed for compressibility and spectral Doppler flow characteristics on the requested limb. The assessed veins include common femoral vein, femoral vein in the thigh, popliteal vein, and intramuscular calf veins. The iliac vein is assessed indirectly using Doppler waveform analysis. The great saphenous vein is assessed for compressibility at the saphenofemoral junction, and the small saphenous vein assessed for compressibility behind the knee. A contralateral PW Doppler waveform is obtained for comparison.  Right Duplex Findings CFV Doppler waveform appears appropriately phasic.  Left Duplex Findings All veins visualized appear fully compressible. Doppler flow signals demonstrate normal spontaneity, phasicity, and augmentation.  Right Technical Summary No evidence of iliofemoral obstruction.  Left Technical Summary No evidence of deep venous obstruction in the lower extremity. No indirect evidence of obstruction proximal to the inguinal ligament.  Final Interpretation Right There is no evidence of obstruction proximal to the inguinal ligament or in the common femoral vein. Left There is no evidence of obstruction proximal to the inguinal ligament or in the common femoral vein. There is no evidence of DVT in the lower extremity.  Electronically signed by [id number] Patrici Ranks MD on 01/02/2018 at 8:05:46 AM.      _____________________________________________________________________________  Discharge Instructions:               Follow Up instructions and Outpatient Referrals     Ambulatory referral to Home Health      Disciplines requested:   Nursing  Physical Therapy       Nursing requested:  Other: (please enter in comments) Comment - resume pleurex catheter and foley care    Physical Therapy requested:  Evaluate and treat    Physician to follow patient's care:  Referring Provider    Requested start of care date:  Routine (within 48 hours)    Do you want ongoing co-management?:  No    Evaluate and treat for home health:  Skilled Nursing:  Please resume bilateral pleurex catheter care - pt.'s family is independent.  Supplies provided by [facility]  Reinforce teaching for foley care, change catheter every 30 days.  Physical Therapy: Please evaluate and treat. NOTE: Weightbearing: full weightbearing.    Start of Care Date: 2-3 Days after Discharge.    Physician to follow patient's care (the person listed here will be responsible for signing ongoing orders):  Referring Provider:       Agency Details: [facility] [phone].  I certify that Hailley V Goodwill is confined to his/her home and needs intermittent skilled nursing care, physical therapy and/or speech therapy or continues to need occupational therapy. The patient is under my care, and I have authorized services on this plan of care and will periodically review the plan. The patient had a face-to-face encounter with an allowed provider type on 01/02/18 and the encounter was related to the primary reason for home health care.         Ambulatory referral to Urology      For STAT referrals page urologist on call to approve appt    Please follow up in 1-2 weeks from hospital discharge (01/02/18) for discharge with an indwelling foley.         Discharge instructions      Why was I admitted?   You have been admitted with leg pain, tenderness, and redness. This is caused by a bacterial infection of your skin.    What should I watch for in the next few weeks?  -Your blood sugar was variable throughout her hospital stay.  Recommend continuing to monitor your insulin dose at home and bring it up with your primary care physician when you see them for appropriate insulin adjustments.  -Please continue to take your antibiotics for your lower leg cellulitis.  -We will hold your blood pressure medication called losartan because your blood pressures were a little low during her hospital admission.  We will communicate to your primary care physician to reevaluate this when you see them in a little over a week.    You have had delirium while here in the hospital. That means confused thinking that comes and goes.. It may continue for a time after you leave the hospital. Here are suggestions for how to improve this:  - Make sure to stay awake as much as possible during the day  - During the day, keep lights on and curtains open.   - During the night, keep lights out and TV off, except for a small night-light if needed for safety.   - Delirium sometimes feels scary. It helps to have someone explain over and over again what's going on, what day it is, and where you are.   - Being able to see and hear clearly can help. So if you have glasses or hearing aids, they should be kept on during waking hours.   It is important that someone stay with you in your home until the delirium is better. If delirium is lingering beyond a couple of days or getting worse anytime, contact the doctors (see below for contact information).    Who will I follow up with?  We have scheduled you follow up with these doctors:    Follow up with your primary care, Dr. Allena Katz, on 01/10/2018  We will schedule you for your Urology appointment in 1-2 weeks. You should be contacted by them for this appointment. If you do not hear, please bring it up at your appointment on 01/10/18.      When you see your primary doctor, please get these labs drawn:  - CBC     It is important to discuss healthcare issues with your primary care doctor at an upcoming visit, so that your doctor knows what is important to you and what you would or would not want done if you were to become very ill    In general, if you have specific questions about these instructions or your hospital stay in the  next 48 hours, please call [phone] and ask for the geriatrics doctor on call. Let them know that you were just discharged from the hospital.     For new symptoms or problems, call your primary care provider.     If you believe that this is a life-threatening emergency, call 911 or go to the emergency room.     Thank you for allowing [place] to participate in your care.               Appointments which have been scheduled for you    Jan 10, 2018 12:00 PM EST  (Arrive by 11:45 AM)  OFFICE VISIT with [name], MD  [facility] FAMILY MEDICINE [facility] REGION) [address] [place] [address]  [place] [address]  [phone]      Jan 12, 2018  9:30 AM EST  (Arrive by 9:15 AM)  MRI BRAIN W WO CONTRAST    -UN with [facility] MRI MBL  IMG MRI [facility]) [address]  [place] [address]  [phone]   On appt date:  Bring recent lab work  Bring documentation of any metal object implants  Take meds as usual  Check w/physician if diabetic  You will be asked to change into a gown for your safety    On appt date do not:  Consume anything 2 hrs  Wear metallic items including jewelry (we are not responsible for lost items)    Let us know if pt:  Claustrophobic  Metal object implant  Pregnant  Prescribed a sedative  On dialysis  Allergic to MRI dye/contrast Kidney Failure    (Title:MRIWCNTRST)     Jan 12, 2018 10:30 AM EST  (Arrive by 10:15 AM)  CT CHEST WO CONTRAST with HBR CT RM 1  IMG CT [facility]) [address]  [place] [address]  [phone]   Let us know if pt:  Pregnant or nursing  Claustrophobic    (Title:CTWOCNTRST)     Jan 16, 2018  8:00 AM EST  (Arrive by 7:30 AM)  LAB ONLY [place] with ADULT ONC LAB  [facility] ADULT ONCOLOGY LAB DRAW STATION [place] [facility] REGION) [address]  [place] [address]  [phone]      Jan 16, 2018  9:00 AM EST  (Arrive by 8:30 AM)  RETURN LUNG ACTIVE [place] with Molli Barrows, MD  [place] ONCOLOGY MULTIDISCIPLINARY 2ND [facility] REGION) [address]  [place] [address]  [phone]      Jan 16, 2018  9:30 AM EST  (Arrive by 9:00 AM)  RETURN PALLIATIVE CARE with Sonny Masters, MD  [facility] ONCOLOGY MULTIDISCIPLINARY 2ND [facility]OSP [facility] REGION) [address]  [place] [employer] [place] [address]  [phone]

## 2018-01-02 NOTE — Unmapped (Signed)
All VSS, denies pain, BS requiring I and O for 4th time, foley ordered. Also now have order for pleuryx drainage, awaiting 2nd bulb kit. Please see flow sheets for details.  Problem: Adult Inpatient Plan of Care  Goal: Plan of Care Review  Outcome: Progressing  Goal: Patient-Specific Goal (Individualization)  Outcome: Progressing  Goal: Absence of Hospital-Acquired Illness or Injury  Outcome: Progressing  Goal: Optimal Comfort and Wellbeing  Outcome: Progressing  Goal: Readiness for Transition of Care  Outcome: Progressing  Goal: Rounds/Family Conference  Outcome: Progressing     Problem: Self-Care Deficit  Goal: Improved Ability to Complete Activities of Daily Living  Outcome: Progressing     Problem: Fall Injury Risk  Goal: Absence of Fall and Fall-Related Injury  Outcome: Progressing

## 2018-01-02 NOTE — Unmapped (Addendum)
Antibiotic Timeout Checklist  Indication for antibiotics: skin and soft tissue infection  Antibiotic Start Date: 12/31/17  Current systemic antibiotics: cephalexin 500 mg PO q6 hours  Microbiology Results: N/A --> treating lower extremity cellulitis, no cultures obtained  Sensitivities Available? N/A  Are antibiotics still indicated? YES  Is it appropriate to de-escalate? NA: Antibiotics already de-escalated  Is it appropriate to convert to PO therapy? NA- patient already on PO therapy  Today's antibiotic plan: Continue cephalexin 500 mg, change to q8 hours given patient's renal function  Planned Antibiotic Duration: 5 days of therapy, through 01/05/18

## 2018-01-02 NOTE — Unmapped (Signed)
Pt resting in bed with family at bedside.  No report of pain or distress.  Pt repositioned per protocol.  Pt left floor with pt transport for echo and returned in stable condition.  Pt receiving 2nd unit of PRBC's.  Will continue to monitor, call bell in reach.    Problem: Adult Inpatient Plan of Care  Goal: Plan of Care Review  Outcome: Progressing     Problem: Self-Care Deficit  Goal: Improved Ability to Complete Activities of Daily Living  Outcome: Progressing     Problem: Fall Injury Risk  Goal: Absence of Fall and Fall-Related Injury  Outcome: Progressing     Problem: Skin Injury Risk Increased  Goal: Skin Health and Integrity  Outcome: Progressing     Problem: Infection (Sepsis/Septic Shock)  Goal: Absence of Infection Signs/Symptoms  Outcome: Progressing     Problem: Diabetes Comorbidity  Goal: Blood Glucose Level Within Desired Range  Outcome: Progressing

## 2018-01-02 NOTE — Unmapped (Signed)
Latest H/H has improved. Pt was quite  irritable, since the start of the shift. As per daughter, she noted that pt becomes more agitated at night. Pt  was initially refusing to take med but took it after a lot of encouragement early on this shift. Pt  was also about to hit NA when she was awakened to be repositioned and to do a bladder scan at midnight. Midnight antibiotic not given. As per daughter pt is becoming combative when awakened and will refuse dose offered. Bladder scan result only 325 ml over 12 hrs. Will do another scan at 0400 am. On-call MD updated.  Problem: Adult Inpatient Plan of Care  Goal: Plan of Care Review  Outcome: Progressing  Goal: Patient-Specific Goal (Individualization)  01/02/2018 0145 by Arsenio Loader, RN  Flowsheets (Taken 01/02/2018 0145)  Anxieties, Fears or Concerns: Irritabe,unable to set a goal.  01/02/2018 0144 by Arsenio Loader, RN  Outcome: Progressing  Goal: Absence of Hospital-Acquired Illness or Injury  Outcome: Progressing  Goal: Optimal Comfort and Wellbeing  Outcome: Progressing  Goal: Readiness for Transition of Care  Outcome: Progressing  Goal: Rounds/Family Conference  Outcome: Progressing     Problem: Self-Care Deficit  Goal: Improved Ability to Complete Activities of Daily Living  Outcome: Progressing     Problem: Fall Injury Risk  Goal: Absence of Fall and Fall-Related Injury  Outcome: Progressing     Problem: Skin Injury Risk Increased  Goal: Skin Health and Integrity  Outcome: Progressing     Problem: Infection (Sepsis/Septic Shock)  Goal: Absence of Infection Signs/Symptoms  Outcome: Progressing     Problem: Diabetes Comorbidity  Goal: Blood Glucose Level Within Desired Range  Outcome: Progressing

## 2018-01-03 DIAGNOSIS — I482 Chronic atrial fibrillation, unspecified: Secondary | ICD-10-CM | POA: Diagnosis not present

## 2018-01-03 DIAGNOSIS — Z9181 History of falling: Secondary | ICD-10-CM | POA: Diagnosis not present

## 2018-01-03 DIAGNOSIS — C3412 Malignant neoplasm of upper lobe, left bronchus or lung: Secondary | ICD-10-CM | POA: Diagnosis not present

## 2018-01-03 DIAGNOSIS — R530 Neoplastic (malignant) related fatigue: Secondary | ICD-10-CM | POA: Diagnosis not present

## 2018-01-03 DIAGNOSIS — E119 Type 2 diabetes mellitus without complications: Secondary | ICD-10-CM | POA: Diagnosis not present

## 2018-01-03 DIAGNOSIS — I10 Essential (primary) hypertension: Secondary | ICD-10-CM | POA: Diagnosis not present

## 2018-01-05 ENCOUNTER — Ambulatory Visit: Admit: 2018-01-05 | Discharge: 2018-01-06 | Payer: MEDICARE | Attending: Urology | Primary: Urology

## 2018-01-05 DIAGNOSIS — R339 Retention of urine, unspecified: Principal | ICD-10-CM

## 2018-01-05 NOTE — Unmapped (Signed)
ANALYSIS AND PLAN:  This is a 84 y.o.-year-old female who presents for evaluation of acute urinary retention in the setting of recent hospitalization with a history of metastatic lung cancer with neural involvement, pulmonary embolism, poorly controlled diabetes and atrial fibrillation and pulmonary embolism on chronic anticoagulation therapy.  I reviewed with the patient and her family her urinary symptoms.  I suspect her because of her urine retention is multifactorial in the setting of recent hospitalization, deconditioning, diabetes, and her cancer with neuro involvement.  The patient does have some hematuria noted recently and is unclear if this is catheter trauma in the setting of anticoagulation.  I do not see any dedicated imaging of her abdomen and pelvis to assess her kidneys and bladder for potential involvement though her CT of the lumbar spine in May did show significant bladder distention and its limited views.  We would consider further evaluation if the patient has persistent hematuria in the future.  At this point, we discussed continued indwelling Foley catheterization with either an indwelling catheter versus suprapubic catheter versus a trial of voiding spontaneously with potential need for clean intermittent catheterization.  Patient and family are interested in trial of void with understanding for clean intermittent catheterization.  This will be talked to the family and reportedly the patient's daughter who lives with her is a [name] [name] who would able to help with catheterization.  I have asked them to do in and out catheterization up to every 4-6 hours to keep volumes less than 400 mL.  We will check a formal urinalysis and culture today.  They understand that if she is unable to void and unable to perform intermittent catheterization the patient will require repeat indwelling Foley catheter.  We will subsequently have her follow-up in our clinic in approximately 3 months to reassess symptoms. For we will be happy to see the patient back anytime sooner if symptoms arise.    [name] College, MD  [address]  [address] [place], [place] [address]    CHIEF COMPLAINT: History of urinary retention here for evaluation.    BRIEF HISTORY OF PRESENT ILLNESS:  [name] [name] is very pleasant 84 y.o. 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  The patient was recently hospitalized for worsening lower extremity edema.  During her hospitalization she was noted with elevated postvoid residuals despite intermittent catheterization requiring indwelling Foley catheter placement.  The family states that the patient did not have any urologic issues prior to her hospitalization except for possible urinary frequency.  There is no clear history of prior urinary tract infections or hematuria.  Since the patient has been at home she was noted with some increase sensation of urinary urgency with her catheter in place.  She has since had new onset gross hematuria though is unclear if she has had some catheter trauma and irritation since being at home.  The patient has no history of fevers, chills, nausea, vomiting, chest pain or change in shortness of breath.     PAST MEDICAL HISTORY:     Past Medical History:   Diagnosis Date   ??? Atrial fibrillation (CMS-HCC)    ??? Cancer (CMS-HCC)    ??? Diabetes mellitus (CMS-HCC)    ??? Hypertension    ??? Metastatic lung cancer (metastasis from lung  to other site) (CMS-HCC)    ??? Pulmonary embolism (CMS-HCC)        PAST SURGICAL HISTORY:   Past Surgical History:   Procedure Laterality Date   ??? CHG RAD GUIDED,PERCUT DRAINAGE,W/CATH PLACE Right 06/19/2017    Procedure: RADIOLOGICAL GUIDANCE, FOR PERCUTANEOUS DRAINAGE, WITH PLACEMENT OF CATHETER, RAD S&I; Surgeon: Sherwood Gambler, MD;  Location: [facility]ON[facility] PROCEDURE LAB [facility];  Service: Pulmonary   ??? CHOLECYSTECTOMY     ??? HYSTERECTOMY     ??? neck surgey     ??? PR INSERTION INDWELLING TUNNELED PLEURAL CATHETER Bilateral 06/19/2017    Procedure: INSERTION OF INDWELLING TUNNELED PLEURAL CATHETER WITH CUFF WITH MODERATE SEDATION;  Surgeon: Sherwood Gambler, MD;  Location: [facility]ON[facility] PROCEDURE LAB [facility];  Service: Pulmonary   ??? PR INSERTION INDWELLING TUNNELED PLEURAL CATHETER Left 10/26/2017    Procedure: INSERTION OF INDWELLING TUNNELED PLEURAL CATHETER WITH CUFF;  Surgeon: Mercy Moore, MD;  Location: [facility];  Service: Pulmonary   ??? PR MOD SED SAME PHYS/QHP EACH ADDL 15 MINS  06/19/2017    Procedure: MODERATE SEDATION SERVICES PROVIDED BY SAME PHYSICIAN OR OTHER QUALIFIED HEALTH CARE PROFESSIONAL PERFORMING THE DIAGNOSTIC OR THERAPEUTIC SERVICE THAT SEDATION SUPPORTS, EACH ADDITIONAL 15 MINS;  Surgeon: Sherwood Gambler, MD;  Location: BRON[facility] PROCEDURE LAB [facility];  Service: Pulmonary   ??? PR MOD SED SAME PHYS/QHP INITIAL 15 MINS 5/> YRS  06/19/2017    Procedure: MODERATE SEDATION SERVICES PROVIDED BY SAME PHYSICIAN OR OTHER QUALIFIED HC PROFESSIONAL PERFORMING THE DIAGNOSTIC OR THERAPEUTIC SERVICE THAT SEDATION SUPPORTS, INIT 15 MINS, PT AGE 83 YEARS OR OLDER;  Surgeon: Sherwood Gambler, MD;  Location: [facility]ON[facility] PROCEDURE LAB [facility];  Service: Pulmonary       ALLERGIES:    is allergic to sulfasalazine.    MEDICATIONS:  Current Outpatient Medications   Medication Sig Dispense Refill   ??? blood sugar diagnostic Strp Check 4x/day as instructed 120 strip 5   ??? blood-glucose meter kit Check BGs 4x/day as instructed 1 each 0   ??? calcium carbonate-vitamin D3 (CALTRATE 600 + D) 600 mg (1,500 mg)-800 unit Chew Chew 1 tablet Two (2) times a day.     ??? cephalexin (KEFLEX) 500 MG capsule Take 1 capsule (500 mg total) by mouth Three (3) times a day. for 4 days 12 capsule 0   ??? crizotinib (XALKORI) 250 mg capsule Take 1 capsule (250 mg total) by mouth Two (2) times a day. 60 capsule 3   ??? ELIQUIS 5 mg Tab Take 5 mg by mouth Two (2) times a day. TAKE 1 TABLET (5 MG TOTAL) BY MOUTH TWO (2) TIMES A DAY.  3   ??? furosemide (LASIX) 20 MG tablet Take 20 mg by mouth daily as needed for swelling.     ??? insulin aspart protamine-insulin aspart (NOVOLOG MIX 70-30FLEXPEN U-100) 100 unit/mL (70-30) injection pen Inject 0.1 mL (10 Units total) under the skin two (2) times a day. 15 mL 0   ??? lidocaine 4 % patch Place 1 patch on the skin daily.     ??? metFORMIN (GLUCOPHAGE-XR) 500 MG 24 hr tablet TAKE 2 TABLETS (1,000 MG TOTAL) BY MOUTH TWO (2) TIMES A DAY. 360 tablet 3   ??? ONETOUCH ULTRA2 METER Misc CHECK BGS 4X/DAY AS INSTRUCTED  0   ??? pen needle, diabetic (BD ULTRA-FINE SHORT PEN NEEDLE) 31 gauge x 5/16 Ndle USE AS DIRECTED. USE 4 NEEDLES A DAY WITH DIABETIC MEDICATION PENS AS DIRECTED     ??? potassium  chloride SA (K-DUR,KLOR-CON) 20 MEQ tablet Take 10 mEq by mouth Two (2) times a day. Take 1/2 tablet (10 mEq) BID     ??? ranitidine (ZANTAC) 150 MG tablet Take 150 mg by mouth daily.     ??? simethicone (GAS-X) 80 MG chewable tablet Chew 1 tablet (80 mg total) 4 (four) times a day as needed for flatulence (gas relief). 100 tablet 2     No current facility-administered medications for this visit.         SOCIAL HISTORY:  Social History     Tobacco Use   ??? Smoking status: Never Smoker   ??? Smokeless tobacco: Never Used   Substance Use Topics   ??? Alcohol use: No   ??? Drug use: Never        FAMILY HISTORY:    Family History   Problem Relation Age of Onset   ??? Cancer Brother         lung   ??? Cancer Daughter         lung   ??? Cancer Daughter         breast       REVIEW OF SYSTEMS:  A 10-system review of systems was completed by the patient and reviewed by me today.  All pertinent positives were discussed in history of present illness.    PHYSICAL EXAMINATION:  VITAL SIGNS:  The patient is afebrile.  Vital signs are stable.  GENERAL:  In no apparent distress sitting in wheelchair.  HEENT:  Eyes, ears and mouth appeared normal.  LYMPHATICS:  No cervical lymphadenopathy.  LUNGS:  Chest wall excursion symmetric bilaterally. No audible wheezing  ABDOMEN:  Soft, nontender, nondistended.  GU: Foley catheter in place with thin dark maroon/cola colored urine without clots  EXTREMITIES:  No lower extremity edema.  SKIN:  No rashes or jaundice.  NEUROLOGIC:  No focal deficits on neurologic exam.    LABORATORY TESTING:      Chemistry        Component Value Date/Time    NA 138 01/02/2018 0617    K 3.7 01/02/2018 0617    CL 108 (H) 01/02/2018 0617    CO2 28.0 01/02/2018 0617    BUN 29 (H) 01/02/2018 0617    CREATININE 0.94 01/02/2018 0617    GLU 151 01/02/2018 0617        Component Value Date/Time    CALCIUM 7.9 (L) 01/02/2018 0617    ALKPHOS 102 01/01/2018 0616    AST 24 01/01/2018 0616    ALT 21 01/01/2018 0616    BILITOT 0.4 01/01/2018 0616           01/01/18 0725 05/19/17 0542    Hemoglobin A1C 4.8 - 5.6 % 6.4High   8.1High      Estimated Average Glucose mg/dL 725  366      Urine Culture, Comprehensive Mixed Urogenital Flora          Specimen Source: Clean Catch      Resulting Agency: [facility]         Specimen Collected: 12/20/17 13:27 Last Resulted: 12/22/17 12:48             07/19/17 CT lumbar spine  IMPRESSION:  -- T12 inferior endplate compression fracture, new from 06/03/2017.  --Small bilateral pleural effusions with incompletely imaged chest tubes in place.  --Moderate pericardial effusion.  -- Known liver mass and mesenteric mass redemonstrated.  -- Small-volume ascites.  -- Osteopenia.  -- Scattered densely sclerotic bone lesions are  clearly could reflect metastases, (possibly treated) or bone islands.  -- Distention of the urinary bladder

## 2018-01-05 NOTE — Unmapped (Addendum)
Reviewed urinary symptoms and urinary retention  Discussed voiding trial with potential need for intermittent catheterization versus indwelling catheter versus suprapubic catheter  We will attempt voiding trial today with clean intermittent catheter teaching; would recommend intermittent catheterization every 4-6 hours to keep cath volumes less than 400 mL; if catheter volumes are less than 400 mL can increase duration between catheterizations or stop catheterizations if no evidence of holding excess urine  Urinalysis and culture today  If blood and urine persist may need further evaluation in the future  Return to clinic in 3 months

## 2018-01-05 NOTE — Unmapped (Signed)
Patient family/caregivers was taught CIC with [name] [name] straight female catheter. Family was able to demonstrate proper technique and hygiene while performing CIC. Instructed patient/family to call us if they are unable to perform or if events at home hinder being able to perform CIC every 4 hours (Per Dr Marcy Salvo) order was given to Rodney Booze, CMA to send into [facility]. Donell Sievert, RN

## 2018-01-08 ENCOUNTER — Ambulatory Visit: Admit: 2018-01-08 | Discharge: 2018-01-09 | Payer: MEDICARE | Attending: Family Medicine | Primary: Family Medicine

## 2018-01-08 DIAGNOSIS — Z794 Long term (current) use of insulin: Secondary | ICD-10-CM

## 2018-01-08 DIAGNOSIS — D649 Anemia, unspecified: Secondary | ICD-10-CM

## 2018-01-08 DIAGNOSIS — L0390 Cellulitis, unspecified: Secondary | ICD-10-CM

## 2018-01-08 DIAGNOSIS — E119 Type 2 diabetes mellitus without complications: Secondary | ICD-10-CM

## 2018-01-08 DIAGNOSIS — I10 Essential (primary) hypertension: Principal | ICD-10-CM

## 2018-01-08 LAB — CBC
HEMATOCRIT: 31.8 % — ABNORMAL LOW (ref 36.0–46.0)
HEMOGLOBIN: 10.6 g/dL — ABNORMAL LOW (ref 12.0–16.0)
MEAN CORPUSCULAR HEMOGLOBIN CONC: 33.3 g/dL (ref 31.0–37.0)
MEAN CORPUSCULAR HEMOGLOBIN: 25.7 pg — ABNORMAL LOW (ref 26.0–34.0)
MEAN CORPUSCULAR VOLUME: 77.3 fL — ABNORMAL LOW (ref 80.0–100.0)
MEAN PLATELET VOLUME: 8.5 fL (ref 7.0–10.0)
RED BLOOD CELL COUNT: 4.11 10*12/L (ref 4.00–5.20)
RED CELL DISTRIBUTION WIDTH: 23.8 % — ABNORMAL HIGH (ref 12.0–15.0)
WBC ADJUSTED: 8.9 10*9/L (ref 4.5–11.0)

## 2018-01-08 LAB — WBC ADJUSTED: Lab: 8.9

## 2018-01-08 MED ORDER — BLOOD-GLUCOSE METER
1 refills | 0 days | Status: CP
Start: 2018-01-08 — End: 2018-01-11

## 2018-01-08 MED ORDER — FERROUS SULFATE 325 MG (65 MG IRON) TABLET,DELAYED RELEASE
ORAL_TABLET | Freq: Every day | ORAL | 3 refills | 0 days | Status: SS
Start: 2018-01-08 — End: 2018-01-22

## 2018-01-08 NOTE — Unmapped (Signed)
Labs successfully drawn on first attempt.Patient tolerated procedure well.

## 2018-01-08 NOTE — Unmapped (Addendum)
Soft pressures, pt's son reports fatigue which is likely multifactorial  - BP medication held throughout hospitalization; continue without BP meds for now  - Continue Lasix 20 mg daily PRN until swelling subsides  BP Readings from Last 3 Encounters:   01/08/18 112/76   01/05/18 134/74   01/02/18 106/58

## 2018-01-08 NOTE — Unmapped (Signed)
-   Complete Keflex 500 mg TID x 5 days  - Continue Lasix 20 mg daily PRN until swelling subsides  - Continue elevating legs throughout the day

## 2018-01-08 NOTE — Unmapped (Addendum)
A1c at goal; Patient was on sliding scale insulin (4-6u with meals) throughout hospitalization  - Decrease Novolog 70/30 injection pen from 10u BID to 8u BID; Patient has been taking <10u BID  - Restart Metformin XR 1000 mg BID; patient stopped due to Keflex course  - Continue sending BG readings 1-2x weekly  - Consider sliding scale insulin in future for continued increased BG readings  Urged son not to adjust medication dose and let me know if pt has SE or hypoglycemic episodes  Lab Results   Component Value Date    A1C 6.4 (H) 01/01/2018    A1C 8.1 (A) 09/26/2017    A1C 9.6 (A) 08/22/2017    A1C 10.3 (A) 08/01/2017    A1C 8.1 (H) 05/19/2017

## 2018-01-08 NOTE — Unmapped (Signed)
Patient ID: [name] [name] is a 84 y.o. female who presents for Transition Care Management.  Informant: Patient is accompanied by son.    Assessment/Plan:         HTN (hypertension)  Soft pressures, pt's son reports fatigue which is likely multifactorial  - BP medication held throughout hospitalization; continue without BP meds for now  - Continue Lasix 20 mg daily PRN until swelling subsides  BP Readings from Last 3 Encounters:   01/08/18 112/76   01/05/18 134/74   01/02/18 106/58       Diabetes mellitus, type 2 (CMS-HCC)  A1c at goal; Patient was on sliding scale insulin (4-6u with meals) throughout hospitalization  - Decrease Novolog 70/30 injection pen from 10u BID to 8u BID; Patient has been taking <10u BID  - Restart Metformin XR 1000 mg BID; patient stopped due to Keflex course  - Continue sending BG readings 1-2x weekly  - Consider sliding scale insulin in future for continued increased BG readings  Urged son not to adjust medication dose and let me know if pt has SE or hypoglycemic episodes  Lab Results   Component Value Date    A1C 6.4 (H) 01/01/2018    A1C 8.1 (A) 09/26/2017    A1C 9.6 (A) 08/22/2017    A1C 10.3 (A) 08/01/2017    A1C 8.1 (H) 05/19/2017       Anemia  Anemic upon hospitalization  - Start Ferrous sulfate 325 mg daily; aware may cause dark stools  - Monitor for constipation and blood in stool    Cellulitis  - Complete Keflex 500 mg TID x 5 days  - Continue Lasix 20 mg daily PRN until swelling subsides  - Continue elevating legs throughout the day      Return in about 4 weeks (around 02/05/2018), or if symptoms worsen or fail to improve.    Medical decision making was of moderate [phone]- must be seen within 14 days) complexity.    I have reviewed and addressed the patient???s adherence and response to prescribed medications. I have identified patient barriers to following the proposed medication and treatment plan, and have noted opportunities to optimize healthy behaviors. I have answered the patient???s questions to satisfaction and the patient voices understanding.       Subjective:            Post discharge interactive communication via telephone was made with patient on 01/03/18 and I have reviewed the information from that communication.    HPI  She was hospitalized at [facility] from 01/01/18 to 01/02/18 due to There are no discharge diagnoses documented for the most recent discharge.     Cellulitis  Patient was admitted for LLE cellulitis. She is currently taking Keflex 500 mg TID x 5 days with mild improvement. Patient's son reports a spreading of redness. She restarted Lasix 20 mg this morning to reduce swelling. Patient is elevating legs throughout the day.    Urinary retention  Patient followed with neurology for voiding trial and assessment of urinary retention. She has started urinating on her own and using catheter every other day per son. Denies blood in urine.    Anemia  Patient was anemic upon hospitalization and has not yet started iron supplements.    DM2  A1c at goal. BG reading during hospitalization fluctuated between lows of 60 and highs of 300. She started a sliding scale insulin (4-6u with meals) throughout hospitalization. Patient's son is giving her less than 10u Novolog BID  with decreased BG readings in AMs. Patient has not been taking Metformin 1000 mg BID due to Keflex course. Medication compliance has been an issue in the past. At home BG readings between 400-500 before meals and 100-300 in the mornings when she first wakes up. She reports a decreased appetite. Gets routine meals but has to be forced to eat. Eats breakfast and dinner, usually skips lunch. Drinks water throughout the day.  Lab Results   Component Value Date    A1C 6.4 (H) 01/01/2018    A1C 8.1 (A) 09/26/2017    A1C 9.6 (A) 08/22/2017    A1C 10.3 (A) 08/01/2017    A1C 8.1 (H) 05/19/2017     HTN  BP at goal today. Patient is not currently taking Losartan and potassium supplement. She started Lasix 20 mg daily this morning to help reduce swelling. On previous visit Amlodipine 5 mg was stopped without improvement.  BP Readings from Last 3 Encounters:   01/23/18 130/76   01/16/18 121/71   01/08/18 112/76     Denies difficulty breathing, chest pains, and coughing.    Last OV:  L knee laceration  Seen by ED on 11/08/2017 for fall. Pts son states that pt was using a walker and fell forward. Pts son states that the sutures on left knee broke and he put butterfly bandages over the wound. Sutures removed today. No s/sx of infection. Has been cleaning with mild soap and water and applying neosporin.  ??  Sacral decubitus ulcer - progression since last seen here. They have not been rotating her during the day.    Last OV  Chest pain  Pt reports chest pain from ED visit has resolved.   ??  Thoracic compression fracture  Pt reports having occassional back pain.She currently takes 1000mg  otc Tylenol and Salonpas for pain relief.   ??  Acute pulmonary embolism  Emboli in right upper lobe segmental and subsegmental arteries 04/30/17, taking apixaban BID.   ??  Metastatic lung cancer  Left upper lung 10cm mass found 04/30/17 with 2.7cm right upper lobe nodule. With mediastinal lymphadenopathy and hepatic lobe mass. Liver lesion biopsied 3/21 confirmed adenocarcinoma of lung.   - upcoming f/u for pleural effusion. Stop eliquis as recommend.  - No cardiopulm complaints - admission sxs of CP/DOE have resolved    ROS  As per HPI.    No facility-administered medications prior to visit.      Outpatient Medications Prior to Visit   Medication Sig Dispense Refill   ??? blood sugar diagnostic Strp Check 4x/day as instructed 120 strip 5   ??? calcium carbonate-vitamin D3 (CALTRATE 600 + D) 600 mg (1,500 mg)-800 unit Chew Chew 1 tablet Two (2) times a day.     ??? crizotinib (XALKORI) 250 mg capsule Take 1 capsule (250 mg total) by mouth Two (2) times a day. 60 capsule 3   ??? ELIQUIS 5 mg Tab Take 5 mg by mouth Two (2) times a day. .  3   ??? furosemide (LASIX) 20 MG tablet Take 20 mg by mouth daily as needed for swelling.     ??? insulin aspart protamine-insulin aspart (NOVOLOG MIX 70-30FLEXPEN U-100) 100 unit/mL (70-30) injection pen Inject 0.08 mL (8 Units total) under the skin two (2) times a day. 15 mL 0   ??? lidocaine 4 % patch Place 1 patch on the skin daily as needed.      ??? metFORMIN (GLUCOPHAGE-XR) 500 MG 24 hr tablet TAKE 2 TABLETS (1,000 MG  TOTAL) BY MOUTH TWO (2) TIMES A DAY. 360 tablet 3   ??? pen needle, diabetic (BD ULTRA-FINE SHORT PEN NEEDLE) 31 gauge x 5/16 Ndle USE AS DIRECTED. USE 4 NEEDLES A DAY WITH DIABETIC MEDICATION PENS AS DIRECTED     ??? potassium chloride SA (K-DUR,KLOR-CON) 20 MEQ tablet Take 10 mEq by mouth Two (2) times a day. 1/2 tablet = 10 mEq     ??? ranitidine (ZANTAC) 150 MG tablet Take 150 mg by mouth Two (2) times a day.      ??? blood-glucose meter kit Check BGs 4x/day as instructed 1 each 0   ??? insulin aspart protamine-insulin aspart (NOVOLOG MIX 70-30FLEXPEN U-100) 100 unit/mL (70-30) injection pen Inject 0.1 mL (10 Units total) under the skin two (2) times a day. 15 mL 0   ??? ONETOUCH ULTRA2 METER Misc CHECK BGS 4X/DAY AS INSTRUCTED  0   ??? simethicone (GAS-X) 80 MG chewable tablet Chew 1 tablet (80 mg total) 4 (four) times a day as needed for flatulence (gas relief). 100 tablet 2   ??? cephalexin (KEFLEX) 500 MG capsule Take 1 capsule (500 mg total) by mouth Three (3) times a day. for 4 days 12 capsule 0       Medications prescribed or ordered upon discharge were reviewed today and reconciled with the most recent outpatient medication list.  Medication reconciliation was conducted by a prescribing practitioner, or clinical pharmacist.      The following portions of the patient's history were reviewed and updated as appropriate: allergies, current medications, past medical history, past social history and problem list.          Objective:            Vital Signs  BP 112/76  - Pulse 78  - Temp 36.1 ??C (97 ??F) (Oral)  - Resp 18  - Ht 162.6 cm (5' 4.02)  - Wt 62.1 kg (137 lb)  - BMI 23.50 kg/m??    General appearance: alert, cooperative, NAD   Head: normocephalic and atraumatic  Eyes: normal EOMs, PERRL, normal conjunctiva, no discharge  ENT: OP clear and moist, no tonsillar exudates or erythema, normal TMs and external ear canals, normal nasal passages  Neck: normal ROM, supple, no thyromegaly, no cervical/supraclavicular lymphadenopathy  Lungs: normal work of breathing, good air entry and clear to auscultation bilaterally, no wheezes or crackles appreciated   Heart: regular rate and rhythm, S1, S2 normal, no murmur, click, rub or gallop   Abdomen: Abdomen soft, non-tender, non distended. No masses, no organomegaly. Bowel sounds present.  Extremities: extremities normal, atraumatic, no cyanosis or edema  NEURO: AAOx3, appropriately responds to questions, spontaneous movement of extremities  Psych: normal mood and affect        I attest that I, Pollyann Savoy, personally documented this note while acting as scribe for [name], MD.      Pollyann Savoy, Scribe.  01/08/2018     The documentation recorded by the scribe accurately reflects the service I personally performed and the decisions made by me.    [name], MD

## 2018-01-08 NOTE — Unmapped (Signed)
Anemic upon hospitalization  - Start Ferrous sulfate 325 mg daily; aware may cause dark stools  - Monitor for constipation and blood in stool

## 2018-01-09 DIAGNOSIS — E119 Type 2 diabetes mellitus without complications: Secondary | ICD-10-CM | POA: Diagnosis not present

## 2018-01-09 DIAGNOSIS — R530 Neoplastic (malignant) related fatigue: Secondary | ICD-10-CM | POA: Diagnosis not present

## 2018-01-09 DIAGNOSIS — I10 Essential (primary) hypertension: Secondary | ICD-10-CM | POA: Diagnosis not present

## 2018-01-09 DIAGNOSIS — Z9181 History of falling: Secondary | ICD-10-CM | POA: Diagnosis not present

## 2018-01-09 DIAGNOSIS — I482 Chronic atrial fibrillation, unspecified: Secondary | ICD-10-CM | POA: Diagnosis not present

## 2018-01-09 DIAGNOSIS — C3412 Malignant neoplasm of upper lobe, left bronchus or lung: Secondary | ICD-10-CM | POA: Diagnosis not present

## 2018-01-10 NOTE — Unmapped (Signed)
[facility] Refill Coordination Note    Specialty Medication(s) to be Shipped:   Hematology/Oncology: [name] [name], DOB: Jul 19, 1930  Phone: [phone] (home)       All above HIPAA information was verified with patient's family member.     Completed refill call assessment today to schedule patient's medication shipment from the [facility] [phone]).       Specialty medication(s) and dose(s) confirmed: Regimen is correct and unchanged.   Changes to medications: Ritaj reports no changes reported at this time.  Changes to insurance: No  Questions for the pharmacist: No    The patient will receive a drug information handout for each medication shipped and additional FDA Medication Guides as required.      DISEASE/MEDICATION-SPECIFIC INFORMATION        N/A    ADHERENCE     Medication Adherence    Patient reported X missed doses in the last month:  1  Specialty Medication:  XALKORI 250 mg  Patient is on additional specialty medications:  No  Patient is on more than two specialty medications:  No  Any gaps in refill history greater than 2 weeks in the last 3 months:  no  Demonstrates understanding of importance of adherence:  yes  Informant:  child/children  Reliability of informant:  reliable              Confirmed plan for next specialty medication refill:  delivery by pharmacy          Refill Coordination    Has the Patients' Contact Information Changed:  No  Is the Shipping Address Different:  No         MEDICARE PART B DOCUMENTATION     XALKORI 250 mg: Patient has 8 days worth on hand.    SHIPPING     Shipping address confirmed in Epic.     Delivery Scheduled: Yes, Expected medication delivery date: 01/15/2018 via UPS or courier.     Medication will be delivered via Same Day Courier to the home address in [place].    Jorje Guild   [facility] Specialty Technician

## 2018-01-11 DIAGNOSIS — E119 Type 2 diabetes mellitus without complications: Secondary | ICD-10-CM | POA: Diagnosis not present

## 2018-01-11 DIAGNOSIS — C3412 Malignant neoplasm of upper lobe, left bronchus or lung: Secondary | ICD-10-CM | POA: Diagnosis not present

## 2018-01-11 DIAGNOSIS — Z9181 History of falling: Secondary | ICD-10-CM | POA: Diagnosis not present

## 2018-01-11 DIAGNOSIS — R530 Neoplastic (malignant) related fatigue: Secondary | ICD-10-CM | POA: Diagnosis not present

## 2018-01-11 DIAGNOSIS — I10 Essential (primary) hypertension: Secondary | ICD-10-CM | POA: Diagnosis not present

## 2018-01-11 DIAGNOSIS — I482 Chronic atrial fibrillation, unspecified: Secondary | ICD-10-CM | POA: Diagnosis not present

## 2018-01-11 MED ORDER — BLOOD-GLUCOSE METER
Freq: Four times a day (QID) | 1 refills | 0 days | Status: CP
Start: 2018-01-11 — End: 2018-01-12

## 2018-01-11 MED ORDER — LANCETS
Freq: Four times a day (QID) | 4 refills | 0 days | Status: CP
Start: 2018-01-11 — End: ?

## 2018-01-11 NOTE — Unmapped (Signed)
glucose

## 2018-01-12 ENCOUNTER — Ambulatory Visit: Admit: 2018-01-12 | Discharge: 2018-01-12 | Payer: MEDICARE

## 2018-01-12 DIAGNOSIS — C3490 Malignant neoplasm of unspecified part of unspecified bronchus or lung: Principal | ICD-10-CM

## 2018-01-12 DIAGNOSIS — R9082 White matter disease, unspecified: Secondary | ICD-10-CM | POA: Diagnosis not present

## 2018-01-12 DIAGNOSIS — R918 Other nonspecific abnormal finding of lung field: Secondary | ICD-10-CM | POA: Diagnosis not present

## 2018-01-12 DIAGNOSIS — J90 Pleural effusion, not elsewhere classified: Secondary | ICD-10-CM | POA: Diagnosis not present

## 2018-01-12 MED ORDER — BLOOD-GLUCOSE METER KIT
0 refills | 0 days | Status: CP
Start: 2018-01-12 — End: 2019-01-12

## 2018-01-12 MED ORDER — BLOOD SUGAR DIAGNOSTIC STRIPS
3 refills | 0 days | Status: CP
Start: 2018-01-12 — End: ?

## 2018-01-12 MED ORDER — LANCETS
3 refills | 0 days | Status: CP
Start: 2018-01-12 — End: ?

## 2018-01-12 NOTE — Unmapped (Signed)
Verbal Order approval for Home PT needed for resumption.         1x x 1wk   2x x 2wk     [phone] [facility]

## 2018-01-15 MED FILL — XALKORI 250 MG CAPSULE: 30 days supply | Qty: 60 | Fill #3 | Status: AC

## 2018-01-15 MED FILL — XALKORI 250 MG CAPSULE: ORAL | 30 days supply | Qty: 60 | Fill #3

## 2018-01-16 ENCOUNTER — Ambulatory Visit
Admit: 2018-01-16 | Discharge: 2018-01-16 | Payer: MEDICARE | Attending: Hematology & Oncology | Primary: Hematology & Oncology

## 2018-01-16 ENCOUNTER — Ambulatory Visit: Admit: 2018-01-16 | Discharge: 2018-01-16 | Payer: MEDICARE

## 2018-01-16 ENCOUNTER — Ambulatory Visit
Admit: 2018-01-16 | Discharge: 2018-01-16 | Payer: MEDICARE | Attending: Geriatric Medicine | Primary: Geriatric Medicine

## 2018-01-16 DIAGNOSIS — Z515 Encounter for palliative care: Secondary | ICD-10-CM

## 2018-01-16 DIAGNOSIS — R530 Neoplastic (malignant) related fatigue: Principal | ICD-10-CM

## 2018-01-16 DIAGNOSIS — C3490 Malignant neoplasm of unspecified part of unspecified bronchus or lung: Principal | ICD-10-CM

## 2018-01-16 DIAGNOSIS — Z7189 Other specified counseling: Secondary | ICD-10-CM

## 2018-01-16 LAB — COMPREHENSIVE METABOLIC PANEL
ALBUMIN: 1.9 g/dL — ABNORMAL LOW (ref 3.5–5.0)
ALKALINE PHOSPHATASE: 123 U/L (ref 38–126)
ALT (SGPT): 22 U/L (ref <35)
ANION GAP: 5 mmol/L — ABNORMAL LOW (ref 7–15)
AST (SGOT): 21 U/L (ref 14–38)
BILIRUBIN TOTAL: 0.4 mg/dL (ref 0.0–1.2)
BLOOD UREA NITROGEN: 23 mg/dL — ABNORMAL HIGH (ref 7–21)
BUN / CREAT RATIO: 23
CALCIUM: 8.1 mg/dL — ABNORMAL LOW (ref 8.5–10.2)
CHLORIDE: 103 mmol/L (ref 98–107)
CREATININE: 1.01 mg/dL — ABNORMAL HIGH (ref 0.60–1.00)
EGFR CKD-EPI AA FEMALE: 59 mL/min/{1.73_m2} — ABNORMAL LOW (ref >=60)
EGFR CKD-EPI NON-AA FEMALE: 51 mL/min/{1.73_m2} — ABNORMAL LOW (ref >=60)
GLUCOSE RANDOM: 146 mg/dL (ref 65–179)
POTASSIUM: 3.2 mmol/L — ABNORMAL LOW (ref 3.5–5.0)
PROTEIN TOTAL: 5 g/dL — ABNORMAL LOW (ref 6.5–8.3)
SODIUM: 135 mmol/L (ref 135–145)

## 2018-01-16 LAB — MANUAL DIFFERENTIAL
BASOPHILS - REL (DIFF): 0 %
EOSINOPHILS - ABS (DIFF): 0 10*9/L (ref 0.0–0.4)
EOSINOPHILS - REL (DIFF): 0 %
LYMPHOCYTES - ABS (DIFF): 0.7 10*9/L — ABNORMAL LOW (ref 1.5–5.0)
LYMPHOCYTES - REL (DIFF): 9 %
MONOCYTES - ABS (DIFF): 0.5 10*9/L (ref 0.2–0.8)
MONOCYTES - REL (DIFF): 7 %
NEUTROPHILS - ABS (DIFF): 6.1 10*9/L (ref 2.0–7.5)
NEUTROPHILS - REL (DIFF): 84 %

## 2018-01-16 LAB — BUN / CREAT RATIO: Urea nitrogen/Creatinine:MRto:Pt:Ser/Plas:Qn:: 23

## 2018-01-16 LAB — CBC W/ AUTO DIFF
HEMATOCRIT: 29.9 % — ABNORMAL LOW (ref 36.0–46.0)
HEMOGLOBIN: 10.4 g/dL — ABNORMAL LOW (ref 12.0–16.0)
MEAN CORPUSCULAR HEMOGLOBIN CONC: 34.8 g/dL (ref 31.0–37.0)
MEAN CORPUSCULAR VOLUME: 74.8 fL — ABNORMAL LOW (ref 80.0–100.0)
MEAN PLATELET VOLUME: 7.6 fL (ref 7.0–10.0)
NUCLEATED RED BLOOD CELLS: 1 /100{WBCs} (ref <=4)
RED BLOOD CELL COUNT: 4 10*12/L (ref 4.00–5.20)
RED CELL DISTRIBUTION WIDTH: 23.7 % — ABNORMAL HIGH (ref 12.0–15.0)
WBC ADJUSTED: 7.3 10*9/L (ref 4.5–11.0)

## 2018-01-16 LAB — MEAN CORPUSCULAR VOLUME: Lab: 74.8 — ABNORMAL LOW

## 2018-01-16 LAB — LYMPHOCYTES - ABS (DIFF): Lab: 0.7 — ABNORMAL LOW

## 2018-01-16 NOTE — Unmapped (Signed)
Stop iron pills.    If things don't get better, please call.

## 2018-01-16 NOTE — Unmapped (Signed)
HPI:  [name] [name] is an 84 yo F, never smoker, who initially became symptomatic with dyspnea and cough in late Feb 2019.    History as follows:  Oncology History    - 04/30/17: Presented to ED with dyspnea for over 2 weeks.  CTA with PEs, b/l pleural effusions, mediastinal soft tissue mass 9/5/7cm, invading LUL nd 8.2cm lesion inleft hepatic lobe  - 05/17/17: b/l thoracentesis with 2L removed each lung.  Cytology with no malignant cells but lymphocyte predominant exudative effusiions  - 05/18/17: Biopsy liver lesion c/w lung adenocarcinoma        She lives with her her 2 teenage grandsons and has attentive sons/daughter close by.  Her daughter-in-law has been sleeping overnight with her since diagnosis.  She dresses herself, showers, able to do ADLs but family does most of cooking, grocery shopping and cleaning.  Her son manages her medicines and finances. She is otherwise feeling reasonable well.  Has some fatigue. No falls. Does not use a walker or cane.  She is able to walk around [name] [name] but after exertion like this, has some chest pressure and has to sit down to rest, with resolution usually by .  No stairs at home.  She has insulin dependent DM, HTN and Afib, controlled with meds.      Interval Hx  Initiated crizotinib on 06/23/17 and dose reduced to 250mg  QD on 07/17/17 and then went back up again to BID on 07/24/17. Hospitalized two weeks ago for cellulitis, resolved with antiboitics. Denies recent falls. Sleeping well. Denies shortness of breath or chest pain. Intermittent episodes of bowel urgency, which seem worse since initiation of iron supplementation. Appetite has also been markedly reduced in past couple weeks, which son attributes to the iron supplementation. No saddle anesthesia. Occasional feeling that legs are giving out on her, however this is longstanding. Ambulates with walker. Reports that overall she feels like she is doing good, and that she is tolerating her crizotinib well. Son agrees that she seems to be doing better now than she was prior to her hospitalization, although acknowledges that overall she is becoming more frail.    ROS:  10 systems were reviewed and found to be within normal limits except as described in the HPI.    MEDICATIONS:  Current Outpatient Medications on [name] [name] Prior to Visit   Medication Sig Dispense Refill   ??? blood sugar diagnostic (GLUCOSE BLOOD) Strp Check blood sugar 4 times daily. 100 each 3   ??? blood sugar diagnostic Strp Check 4x/day as instructed 120 strip 5   ??? blood-glucose meter kit Use as instructed 1 each 0   ??? blood-glucose meter Misc 1 each by Miscellaneous route Four (4) times a day. for 1 day 1 each 1   ??? calcium carbonate-vitamin D3 (CALTRATE 600 + D) 600 mg (1,500 mg)-800 unit Chew Chew 1 tablet Two (2) times a day.     ??? [EXPIRED] cephalexin (KEFLEX) 500 MG capsule Take 1 capsule (500 mg total) by mouth Three (3) times a day. for 4 days 12 capsule 0   ??? crizotinib (XALKORI) 250 mg capsule Take 1 capsule (250 mg total) by mouth Two (2) times a day. 60 capsule 3   ??? ELIQUIS 5 mg Tab Take 5 mg by mouth Two (2) times a day. TAKE 1 TABLET (5 MG TOTAL) BY MOUTH TWO (2) TIMES A DAY.  3   ??? ferrous sulfate 325 (65 FE) MG EC tablet Take 1 tablet (325 mg total) by  mouth daily. 90 tablet 3   ??? furosemide (LASIX) 20 MG tablet Take 20 mg by mouth daily as needed for swelling.     ??? insulin aspart protamine-insulin aspart (NOVOLOG MIX 70-30FLEXPEN U-100) 100 unit/mL (70-30) injection pen Inject 0.08 mL (8 Units total) under the skin two (2) times a day. 15 mL 0   ??? lancets (ACCU-CHEK SOFTCLIX LANCETS) Misc Use 4 times daily. 100 each 3   ??? lancets Misc 1 each by Miscellaneous route Four (4) times a day. 200 each 4   ??? lidocaine 4 % patch Place 1 patch on the skin daily.     ??? metFORMIN (GLUCOPHAGE-XR) 500 MG 24 hr tablet TAKE 2 TABLETS (1,000 MG TOTAL) BY MOUTH TWO (2) TIMES A DAY. 360 tablet 3   ??? pen needle, diabetic (BD ULTRA-FINE SHORT PEN NEEDLE) 31 gauge x 5/16 Ndle USE AS DIRECTED. USE 4 NEEDLES A DAY WITH DIABETIC MEDICATION PENS AS DIRECTED     ??? potassium chloride SA (K-DUR,KLOR-CON) 20 MEQ tablet Take 10 mEq by mouth Two (2) times a day. Take 1/2 tablet (10 mEq) BID     ??? ranitidine (ZANTAC) 150 MG tablet Take 150 mg by mouth daily.     ??? simethicone (GAS-X) 80 MG chewable tablet Chew 1 tablet (80 mg total) 4 (four) times a day as needed for flatulence (gas relief). 100 tablet 2     No current facility-administered medications on [name] [name] prior to visit.      ALLERGIES:   Allergies   Allergen Reactions   ??? Sulfasalazine Nausea Only       PMH:  Past Medical History:   Diagnosis Date   ??? Atrial fibrillation (CMS-HCC)    ??? Cancer (CMS-HCC)    ??? Diabetes mellitus (CMS-HCC)    ??? Hypertension    ??? Metastatic lung cancer (metastasis from lung to other site) (CMS-HCC)    ??? Pulmonary embolism (CMS-HCC)      SOCIAL HISTORY:  Lives with her 2 teenage grandsons. Her sons, daughter live nearby.    Never smoker  No alcohol    FAMILY HISTORY:  No hx of cancer in her parents.  Daughter has breast cancer.  Another daughter died of lung cancer and was a heavy smoker for many years.     PHYSICAL EXAMINATION:  BP 121/71  - Pulse 118  - Temp 36.7 ??C (98.1 ??F) (Oral)  - Resp 16  - Wt 58.8 kg (129 lb 9.6 oz)  - SpO2 98%  - BMI 22.23 kg/m??     ECOG 1  GENERAL: Thin, pleasant woman not in acute distress, accompanied by son  MOUTH: Moist and clear without thrush  NECK: Supple  LYMPH: No palpable cervical or supraclavicular lymphadenopathy   CV: Irregular rhythm (noted previously)  CHEST: CTAB with diminished breath sounds in bilateral bases  ABD: Soft, not tender, mildly distended, no palpable organomegaly  EXTREMITIES: Warm and well perfused, BLE edema, RLE>LLE (has previuosly had doppler US)  NEURO: No focal deficits, speech intact, comprehension intact to conversation  AFFECT: Varied and appropriate to situation  DERM: No lesions noted, well-healing lesion on LLE, no cellulitis present LABS:  Lab Results   Component Value Date    WBC 7.3 01/16/2018    HGB 10.4 (L) 01/16/2018    HCT 29.9 (L) 01/16/2018    PLT 407 01/16/2018       Lab Results   Component Value Date    NA 135 01/16/2018    K 3.2 (L)  01/16/2018    CL 103 01/16/2018    CO2 27.0 01/16/2018    BUN 23 (H) 01/16/2018    CREATININE 1.01 (H) 01/16/2018    GLU 146 01/16/2018    CALCIUM 8.1 (L) 01/16/2018    MG 1.7 01/02/2018       Lab Results   Component Value Date    BILITOT 0.4 01/16/2018    BILIDIR <0.10 01/01/2018    PROT 5.0 (L) 01/16/2018    ALBUMIN 1.9 (L) 01/16/2018    ALT 22 01/16/2018    AST 21 01/16/2018    ALKPHOS 123 01/16/2018       Lab Results   Component Value Date    INR 1.48 08/10/2017    APTT 37.9 08/10/2017     RADIOLOGY:  CT CHEST:  Interval continued mild decrease in size of bilateral upper lobe pulmonary masses, compatible with treatment response.  Interval increased moderate bilateral pleural effusions, with chest tubes in place.  Interval progression of vertebral plana of T12 vertebral body likely secondary to metastasis.    PATHOLOGY:  Final Diagnosis 05/19/17   A: Liver, lesion, needle core biopsy   - Metastatic poorly differentiated carcinoma, consistent with adenocarcinoma of lung origin (see ancillary studies)     NOTE: PDL-1 IHC on liver biopsy 09%    STRATA NGS Liver 05/31/17  MET exon 14  RB1 p.W681  TP53 p.L194F  MSS  TMB ??? Low; Mutations per MB: 7  PD-L1 ??? High; RNA expression score: 100    ASSESSMENT AND PLAN:  71 yoF with Stage IV NSCLC-adenocarcinoma, mets to liver, met del 14, on crizotinib.   Laboratory results were reviewed and found to be acceptable.  Images and imaging reports were reviewed and show SD. Continue crizotinib. 67m FU imaging ordered. Continuing to monitor asymptomatic pericardial effusion. Will discontinue iron supplementation given side effects associated with this (poor appetite, bowel urgency). Will monitor nutrition closely given hypoalbuminemia and weight decline. Continues to drain pleurx catheters without issue. Seen by palliative care today as well, appreciate assistance.    Ms. Bento was seen and discussed with Dr. Iver Nestle Agripina Guyette MD  Medical Oncology Fellow PGY5

## 2018-01-16 NOTE — Unmapped (Signed)
ADVANCE CARE PLANNING NOTE    Discussion Date:  January 16, 2018    Patient has decisional capacity: Uncertain.  Patient is able to express preferences though it is uncertain whether she is able to weigh risks and benefits of potential decisions.  She relies on her son [name] [name] to be involved with decision-making.    Patient has selected a Health Care Decision-Maker if loses capacity: Yes    Health Care Decision Maker as of 01/16/2018    [facility]CD[facility]): [name] [name], [name] [name] [phone]    Discussion Participants:  Patient  [name] [name], son  Doreatha Martin, MD, palliative care    Communication of Medical Status/Prognosis:   84 year old woman with metastatic non-small cell lung cancer complicated by bilateral pleural effusion status post Pleurx catheters and calvarial metastatic lesion.  She has been experiencing progressive functional decline in past weeks/months.  She recently had a hospitalization for left lower extremity cellulitis associated with lower extremity edema.  She developed a decubitus ulcer while in the hospital which is still present and for which she is receiving home health.  She is spending increasing time in the chair though was out shopping briefly yesterday with her son who is her main caregiver.  She continues to have every other day drainage of both Pleurx catheters.    Patient received news today that her cancer remains controlled.  Did not specifically discuss prognosis though did express concerns regarding patient's recent functional decline as evidenced by her sacral decubitus ulcer and decreased physical activity.    Communication of Treatment Goals/Options:   Patient is currently taking crizotinib which has been effective in managing her cancer.    Inquired regarding whether patient has any care preferences in the event that she becomes seriously ill.  Patient son states that she has completed a living well though it is currently at home.  He plans to bring to her next visit.  Son's understanding is that patient wants to ride this out in terms of receiving all treatments until there is nothing left.  This would include being on a respirator.  Discussed low likelihood that care in intensive care unit would enable patient to return to her current level of function and that she would most likely not survive.  She states wanting to wait and see.    Son states that patient is living for her family and particularly her great grandson who is 52 years old and for whom she began providing care when he was 88 weeks old secondary to a mother who was living with addiction.    Summary:   Declining functional status and an 84 year old woman with metastatic lung cancer (bilateral pleural effusions) with stable disease on targeted therapy.    No decisions made during this discussion.  Anticipate ongoing communication of patient's condition and care options.  Suspect the patient will reach a point where she lacks decision-making capacity and will then rely on her son [name] [name] to serve as a Social research officer, government.    I spent between 16-45 minutes providing voluntary advance care planning services for this patient.

## 2018-01-16 NOTE — Unmapped (Signed)
OUTPATIENT ONCOLOGY PALLIATIVE CARE    Principal Diagnosis: [name] [name] is a 84 y.o. female with stage 4 NSCLC,  diagnosed in 2019.  Disease sites include brain, liver, s/p bilateral pleurx.     Assessment/Plan:   1.  Lung cancer: While patient scans remain stable, discussed concern with patient and her son regarding her progressive functional decline in recent weeks to few months as evidenced by sacral decubitus ulcer as one manifestation.  Also discussed with her oncologist Dr. Janann August.  Will seek to support her functional status and nutrition as well as possible.  For details of advance care planning communication, refer to separate note.  Patient herself does not demonstrate prognostic awareness as she states hope that she would be able to have cure of this cancer.  Will seek to continue communication regarding her condition and care goals.  Aside from fatigue, she does not currently have significant symptom burden.  Dr. Janann August advised patient to stop supplemental iron given its impact on her appetite which will be monitored.    2. Fatigue: Persistent, most likely associated with malignancy, has been impacting patient's daily function which is more affected by her fear of falling.  Her episodes of hypoglycemia have not recurred per son.  Will monitor at this point.    3.  Advanced care planning: refer to ACP note    F/u: in conjunction with next oncology visit.    ----------------------------------------  Referring Provider: Eloise Harman, MD  Oncology Team: Thoracic  PCP: [name], MD      HPI: 84 year old woman with metastatic non-small cell lung cancer diagnosed 6 months ago (liver, pleura, bone) who was seen for initial visit.  Her son [name] [name] was also present.  Patient lives in [place] with her 30 year old grandson.  She has custody.  She has 24/7 support from family members, including her son [name] [name] who works in IT and is in her home during the day and her daughter-in-law who stays with her at night. Patient herself does some cleaning and like cooking.  She has stopped doing laundry.  Her son states that she has days where she does not feel up to being active.  On other days she estimates 3-4 times a week she is talkative and active.  She will express interest in going shopping for example.  He describes her overall is being lethargic and sleeping more.  Her past medical history includes diabetes.  Her son states that he has been decreasing her insulin dose and consultation with her primary physician.  He does note that she has had fluctuating fingerstick readings from 50 past week to over 500.    Patient denies pain or discomfort and shortness of breath.  Her son assists with draining her bilateral Pleurx catheters every other day and states that approximately 450 cc all removed.  He states that previously he had only been removing 100 cc.  Her appetite is good at times and she has gained 20 pounds after having lost weight in the context of her cancer diagnosis.    Patient states that she does not let cancer bother her.  She denies having any worries.  She will gain strength and seeing the rest of her family doing well.  She states believing in G-d and attending church sometimes.  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    Patient states that she did not know that she had cancer prior to diagnosis.  Her son states understanding that the goal is to shrink the  cancer as it is not curable than treatable.  He states being informed that the cancer has decreased in size and considerable amount and does note hope that it will may be disappear.    Patient states that her bowel function is improved and she denies constipation.  She also denies nausea and vomiting.    Patient states preference for her son [name] [name] to service her healthcare power of attorney.  She states that he knows her best.  She prefers for other family members not to have information about her condition.  [name] [name] states believes that patient would want to be at home if she were dying and not in a hospice facility.  Believes that she would be willing to receive hospital care if she had to be treated.    Current cancer-directed therapy: crizotinib    Interval history, 11/21/17, GW:  Follow up. Son [name] [name] present and provided most of history of recent events.  - Patient had a fall at home few weeks ago.  Fall was witnessed by her son.  She tripped on a rug with her walker.  The knee took the brunt of her fall.  However, she also sustained a laceration to her right scalp and stitches were placed.  Patient did not lose consciousness.  Her son states that she has been more cautious in recent days and overall less active.  He thinks that she has concerns about falling again.  ??? son states that patient's oral intake is about the same.  Overall her energy level has decreased.  When she does get up, she is able to ambulate.  She has not been out shopping.  ??? Son states the patient has not had any fingerstick levels less than 100.  He states that her general fingerstick level is between 200-250.  He has decreased her insulin dose.  ??? Son reports that patient's leg and ankle swelling are under better control.  ???Patient denies pain or shortness of breath.  She states that she feels pretty good.  She spends most of her day in her home.  ??? Son continues to drain both Pleurx catheters every other day.  The left side tends to drain 250 cc in the right side 450 cc.  ??? Son reports that patient has some tremulousness after she first bears weight.  However, this resolves at most after a few minutes.    Interval history, 01/16/2018, GW: Follow-up.  Son [name] [name] was present and participated in this visit.  [name] [name] provided almost all of the history  ???Patient was recently hospitalized for left lower extremity cellulitis.  She has completed antibiotics.  She denies leg pain.  Her son states that her leg appears much better.  She does have persistent bilateral leg swelling.  Son reports that her primary care physician has advised restarting Lasix.  ??? Patient developed a sacral decubitus ulcer while in the hospital.  She is receiving home health and has a dressing in place.  Son notes that she is spending more time in a chair though he did take the patient out shopping yesterday.  ??? Patient received a transfusion while hospitalized.  She was advised to take iron by her primary physician.  Son states that she has not tolerated in terms of having markedly decreased appetite and loose stools???she associates both symptoms with iron use.  She denies nausea.  ??? Dr.Weiss (oncology) participated in a portion of this visit and communicated that patient's CT scan remains stable.  She continues to take crizotinib.  ???  Patient continues to have an open area overlying her left shin which occurred after a fall.  She has been receiving physical therapy at home intermittently.  ???Patient's son drains for bilateral Pleurx catheters every other day.  Today 300 cc drained from the left and 450 cc drained from the right.  Son notes that drainage on the left has increased.  ???Patient insulin regimen was decreased by her primary care physician last week.  Son states that there was one reading of 90 and a high reading of approximately 350.  Her fingerstick reading was 110 this morning.    Palliative Performance Scale: 50% - Ambulation: Mainly sit/lie / Unable to do any work, extensive disease / Self-Care:Considerable assistance required, Intake: Normal or reduced / Level of Conscious: Full or confusion    Coping/Support Issues: feels well supported by her family    Goals of Care: Currently receiving cancer directed treatment.    Social History:   Name of primary support son [name] [name]:   Occupation:  Hobbies:  Current residence / distance from [facility]: [employer]    Advance Care Planning: Expressive preference for her son [name] [name] to serve as her Social research officer, government.  HCPOA:  Natural surrogate decision maker:  Living Will:  ACP note: Objective     Oncology History    - 04/30/17: Presented to ED with dyspnea for over 2 weeks.  CTA with PEs, b/l pleural effusions, mediastinal soft tissue mass 9/5/7cm, invading LUL nd 8.2cm lesion inleft hepatic lobe  - 05/17/17: b/l thoracentesis with 2L removed each lung.  Cytology with no malignant cells but lymphocyte predominant exudative effusiions  - 05/18/17: Biopsy liver lesion c/w lung adenocarcinoma          Patient Active Problem List   Diagnosis   ??? SOB (shortness of breath)   ??? Hypoxemia   ??? Acute pulmonary embolism (CMS-HCC)   ??? HTN (hypertension)   ??? Diabetes mellitus, type 2 (CMS-HCC)   ??? Metastatic lung cancer (metastasis from lung to other site), unspecified laterality (CMS-HCC)   ??? Addison anemia   ??? Lipoma of back   ??? Weight loss   ??? Lump of breast, right   ??? Pernicious anemia   ??? Anemia   ??? Need for shingles vaccine   ??? Pleural effusion   ??? Chest pain on breathing   ??? T12 compression fracture (CMS-HCC)   ??? Dyspepsia   ??? Thyroid nodule   ??? Hematuria   ??? Pressure injury of sacral region, stage 2 (CMS-HCC)   ??? Visit for suture removal   ??? Knee laceration, left, subsequent encounter   ??? UTI symptoms   ??? Iron deficiency anemia   ??? Urinary retention   ??? Cellulitis       Past Medical History:   Diagnosis Date   ??? Atrial fibrillation (CMS-HCC)    ??? Cancer (CMS-HCC)    ??? Diabetes mellitus (CMS-HCC)    ??? Hypertension    ??? Metastatic lung cancer (metastasis from lung to other site) (CMS-HCC)    ??? Pulmonary embolism (CMS-HCC)        Past Surgical History:   Procedure Laterality Date   ??? CHG RAD GUIDED,PERCUT DRAINAGE,W/CATH PLACE Right 06/19/2017    Procedure: RADIOLOGICAL GUIDANCE, FOR PERCUTANEOUS DRAINAGE, WITH PLACEMENT OF CATHETER, RAD S&I;  Surgeon: Sherwood Gambler, MD;  Location: BRONCH PROCEDURE LAB [facility];  Service: Pulmonary   ??? CHOLECYSTECTOMY     ??? HYSTERECTOMY     ??? neck surgey     ??? PR INSERTION  INDWELLING TUNNELED PLEURAL CATHETER Bilateral 06/19/2017    Procedure: INSERTION OF INDWELLING TUNNELED PLEURAL CATHETER WITH CUFF WITH MODERATE SEDATION;  Surgeon: Sherwood Gambler, MD;  Location: BRON[facility] PROCEDURE LAB [facility];  Service: Pulmonary   ??? PR INSERTION INDWELLING TUNNELED PLEURAL CATHETER Left 10/26/2017    Procedure: INSERTION OF INDWELLING TUNNELED PLEURAL CATHETER WITH CUFF;  Surgeon: Mercy Moore, MD;  Location: MAIN OR [facility];  Service: Pulmonary   ??? PR MOD SED SAME PHYS/QHP EACH ADDL 15 MINS  06/19/2017    Procedure: MODERATE SEDATION SERVICES PROVIDED BY SAME PHYSICIAN OR OTHER QUALIFIED HEALTH CARE PROFESSIONAL PERFORMING THE DIAGNOSTIC OR THERAPEUTIC SERVICE THAT SEDATION SUPPORTS, EACH ADDITIONAL 15 MINS;  Surgeon: Sherwood Gambler, MD;  Location: BRONCH PROCEDURE LAB [facility];  Service: Pulmonary   ??? PR MOD SED SAME PHYS/QHP INITIAL 15 MINS 5/> YRS  06/19/2017    Procedure: MODERATE SEDATION SERVICES PROVIDED BY SAME PHYSICIAN OR OTHER QUALIFIED HC PROFESSIONAL PERFORMING THE DIAGNOSTIC OR THERAPEUTIC SERVICE THAT SEDATION SUPPORTS, INIT 15 MINS, PT AGE 25 YEARS OR OLDER;  Surgeon: Sherwood Gambler, MD;  Location: BRONCH PROCEDURE LAB [facility];  Service: Pulmonary       Current Outpatient Medications   Medication Sig Dispense Refill   ??? blood sugar diagnostic (GLUCOSE BLOOD) Strp Check blood sugar 4 times daily. 100 each 3   ??? blood sugar diagnostic Strp Check 4x/day as instructed 120 strip 5   ??? blood-glucose meter kit Use as instructed 1 each 0   ??? blood-glucose meter Misc 1 each by Miscellaneous route Four (4) times a day. for 1 day 1 each 1   ??? calcium carbonate-vitamin D3 (CALTRATE 600 + D) 600 mg (1,500 mg)-800 unit Chew Chew 1 tablet Two (2) times a day.     ??? crizotinib (XALKORI) 250 mg capsule Take 1 capsule (250 mg total) by mouth Two (2) times a day. 60 capsule 3   ??? ELIQUIS 5 mg Tab Take 5 mg by mouth Two (2) times a day. TAKE 1 TABLET (5 MG TOTAL) BY MOUTH TWO (2) TIMES A DAY.  3   ??? ferrous sulfate 325 (65 FE) MG EC tablet Take 1 tablet (325 mg total) by mouth daily. 90 tablet 3   ??? furosemide (LASIX) 20 MG tablet Take 20 mg by mouth daily as needed for swelling.     ??? insulin aspart protamine-insulin aspart (NOVOLOG MIX 70-30FLEXPEN U-100) 100 unit/mL (70-30) injection pen Inject 0.08 mL (8 Units total) under the skin two (2) times a day. 15 mL 0   ??? lancets (ACCU-CHEK SOFTCLIX LANCETS) Misc Use 4 times daily. 100 each 3   ??? lancets Misc 1 each by Miscellaneous route Four (4) times a day. 200 each 4   ??? lidocaine 4 % patch Place 1 patch on the skin daily.     ??? metFORMIN (GLUCOPHAGE-XR) 500 MG 24 hr tablet TAKE 2 TABLETS (1,000 MG TOTAL) BY MOUTH TWO (2) TIMES A DAY. 360 tablet 3   ??? pen needle, diabetic (BD ULTRA-FINE SHORT PEN NEEDLE) 31 gauge x 5/16 Ndle USE AS DIRECTED. USE 4 NEEDLES A DAY WITH DIABETIC MEDICATION PENS AS DIRECTED     ??? potassium chloride SA (K-DUR,KLOR-CON) 20 MEQ tablet Take 10 mEq by mouth Two (2) times a day. Take 1/2 tablet (10 mEq) BID     ??? ranitidine (ZANTAC) 150 MG tablet Take 150 mg by mouth daily.     ??? simethicone (GAS-X) 80 MG chewable tablet Chew 1 tablet (80 mg  total) 4 (four) times a day as needed for flatulence (gas relief). 100 tablet 2     No current facility-administered medications for this visit.        Allergies:   Allergies   Allergen Reactions   ??? Sulfasalazine Nausea Only     Family History:  Cancer-related family history includes Cancer in her brother, daughter, and daughter.  She indicated that her brother is deceased. She indicated that only one of her two daughters is alive.    REVIEW OF SYSTEMS:  A comprehensive review of 14 systems was negative except for pertinent positives noted in HPI.      PHYSICAL EXAM:   Vital signs for this encounter: VS reviewed in EPIC.  GEN: frail appearing older woman sitting up  PSYCH: Alert and oriented to person, place and time. Euthymic.  HEENT: Pupils equally round without scleral icterus. No facial asymmetry. Dressing on right scalp  CV: Regular rhythm with normal rate, no murmurs.  LUNGS: No increased work of breathing.  ABD: Soft and non-tender with no distention  SKIN: No rashes, petechiae or jaundice noted  EXT: 1+ pitting edema of both lower extremities  NEURO: Normal gait and coordination. attentive    Lab Results   Component Value Date    CREATININE 1.01 (H) 01/16/2018     Lab Results   Component Value Date    ALKPHOS 123 01/16/2018    BILITOT 0.4 01/16/2018    BILIDIR <0.10 01/01/2018    PROT 5.0 (L) 01/16/2018    ALBUMIN 1.9 (L) 01/16/2018    ALT 22 01/16/2018    AST 21 01/16/2018        I personally spent over half of a total 30 minutes in counseling and discussion with the patient as described above.    Doreatha Martin, MD  [facility] Outpatient Oncology Palliative Care

## 2018-01-19 DIAGNOSIS — I482 Chronic atrial fibrillation, unspecified: Secondary | ICD-10-CM | POA: Diagnosis not present

## 2018-01-19 DIAGNOSIS — E119 Type 2 diabetes mellitus without complications: Secondary | ICD-10-CM | POA: Diagnosis not present

## 2018-01-19 DIAGNOSIS — C3412 Malignant neoplasm of upper lobe, left bronchus or lung: Secondary | ICD-10-CM | POA: Diagnosis not present

## 2018-01-19 DIAGNOSIS — R530 Neoplastic (malignant) related fatigue: Secondary | ICD-10-CM | POA: Diagnosis not present

## 2018-01-19 DIAGNOSIS — Z9181 History of falling: Secondary | ICD-10-CM | POA: Diagnosis not present

## 2018-01-19 DIAGNOSIS — I10 Essential (primary) hypertension: Secondary | ICD-10-CM | POA: Diagnosis not present

## 2018-01-19 MED ORDER — MIRTAZAPINE 7.5 MG TABLET
ORAL_TABLET | Freq: Every evening | ORAL | 3 refills | 0.00000 days | Status: CP
Start: 2018-01-19 — End: ?

## 2018-01-19 NOTE — Unmapped (Signed)
Called to follow up with patients caregiver. Message left to contact our office for provider recommendations.  Patient should be taken to local ER or urgent care for further evaluation.

## 2018-01-20 DIAGNOSIS — A419 Sepsis, unspecified organism: Principal | ICD-10-CM

## 2018-01-20 DIAGNOSIS — G92 Toxic encephalopathy: Secondary | ICD-10-CM | POA: Diagnosis not present

## 2018-01-20 DIAGNOSIS — Z4682 Encounter for fitting and adjustment of non-vascular catheter: Secondary | ICD-10-CM | POA: Diagnosis not present

## 2018-01-20 DIAGNOSIS — R509 Fever, unspecified: Secondary | ICD-10-CM | POA: Diagnosis not present

## 2018-01-20 DIAGNOSIS — R4182 Altered mental status, unspecified: Secondary | ICD-10-CM | POA: Diagnosis not present

## 2018-01-20 DIAGNOSIS — J90 Pleural effusion, not elsewhere classified: Secondary | ICD-10-CM | POA: Diagnosis not present

## 2018-01-20 DIAGNOSIS — D649 Anemia, unspecified: Secondary | ICD-10-CM | POA: Diagnosis not present

## 2018-01-20 DIAGNOSIS — I4891 Unspecified atrial fibrillation: Secondary | ICD-10-CM | POA: Diagnosis not present

## 2018-01-20 DIAGNOSIS — A4159 Other Gram-negative sepsis: Secondary | ICD-10-CM | POA: Diagnosis not present

## 2018-01-20 DIAGNOSIS — R180 Malignant ascites: Secondary | ICD-10-CM | POA: Diagnosis not present

## 2018-01-20 DIAGNOSIS — R531 Weakness: Secondary | ICD-10-CM | POA: Diagnosis not present

## 2018-01-20 DIAGNOSIS — R000 Tachycardia, unspecified: Secondary | ICD-10-CM | POA: Diagnosis not present

## 2018-01-20 DIAGNOSIS — L89154 Pressure ulcer of sacral region, stage 4: Secondary | ICD-10-CM | POA: Diagnosis not present

## 2018-01-20 DIAGNOSIS — R0902 Hypoxemia: Secondary | ICD-10-CM | POA: Diagnosis not present

## 2018-01-20 DIAGNOSIS — R0602 Shortness of breath: Secondary | ICD-10-CM | POA: Diagnosis not present

## 2018-01-20 DIAGNOSIS — N12 Tubulo-interstitial nephritis, not specified as acute or chronic: Secondary | ICD-10-CM | POA: Diagnosis not present

## 2018-01-20 DIAGNOSIS — R6521 Severe sepsis with septic shock: Secondary | ICD-10-CM | POA: Diagnosis not present

## 2018-01-20 DIAGNOSIS — E872 Acidosis: Secondary | ICD-10-CM | POA: Diagnosis not present

## 2018-01-20 LAB — URINALYSIS WITH CULTURE REFLEX
BILIRUBIN UA: NEGATIVE
GLUCOSE UA: 100 — AB
KETONES UA: NEGATIVE
NITRITE UA: NEGATIVE
PH UA: 6 (ref 5.0–9.0)
SPECIFIC GRAVITY UA: 1.02 (ref 1.005–1.040)
SQUAMOUS EPITHELIAL: <1 /HPF (ref 0–5)
UROBILINOGEN UA: 0.2

## 2018-01-20 LAB — COMPREHENSIVE METABOLIC PANEL
ALBUMIN: 2 g/dL — ABNORMAL LOW (ref 3.5–5.0)
ALKALINE PHOSPHATASE: 124 U/L (ref 38–126)
ALT (SGPT): 22 U/L (ref <35)
ANION GAP: 4 mmol/L — ABNORMAL LOW (ref 7–15)
AST (SGOT): 24 U/L (ref 14–38)
BILIRUBIN TOTAL: 0.4 mg/dL (ref 0.0–1.2)
BLOOD UREA NITROGEN: 30 mg/dL — ABNORMAL HIGH (ref 7–21)
BUN / CREAT RATIO: 27
CALCIUM: 8 mg/dL — ABNORMAL LOW (ref 8.5–10.2)
CHLORIDE: 104 mmol/L (ref 98–107)
CO2: 27 mmol/L (ref 22.0–30.0)
CREATININE: 1.13 mg/dL — ABNORMAL HIGH (ref 0.60–1.00)
EGFR CKD-EPI AA FEMALE: 51 mL/min/{1.73_m2} — ABNORMAL LOW (ref >=60)
EGFR CKD-EPI NON-AA FEMALE: 44 mL/min/{1.73_m2} — ABNORMAL LOW (ref >=60)
GLUCOSE RANDOM: 232 mg/dL — ABNORMAL HIGH (ref 65–179)
POTASSIUM: 3.9 mmol/L (ref 3.5–5.0)
PROTEIN TOTAL: 5.2 g/dL — ABNORMAL LOW (ref 6.5–8.3)

## 2018-01-20 LAB — POLYCHROMASIA

## 2018-01-20 LAB — LIPASE: Triacylglycerol lipase:CCnc:Pt:Ser/Plas:Qn:: 18 — ABNORMAL LOW

## 2018-01-20 LAB — BLOOD GAS, VENOUS
HCO3 VENOUS: 28 mmol/L — ABNORMAL HIGH (ref 22–27)
O2 SATURATION VENOUS: 26.9 % — ABNORMAL LOW (ref 40.0–85.0)
PCO2 VENOUS: 47 mmHg (ref 40–60)
PH VENOUS: 7.43 (ref 7.32–7.43)
PO2 VENOUS: 21 mmHg — ABNORMAL LOW (ref 30–55)

## 2018-01-20 LAB — HEPARIN CORRELATION: Lab: 0.2

## 2018-01-20 LAB — APTT
APTT: 44 s — ABNORMAL HIGH (ref 25.9–39.5)
HEPARIN CORRELATION: 0.2

## 2018-01-20 LAB — INR: Lab: 2.31

## 2018-01-20 LAB — GLUCOSE RANDOM: Glucose:MCnc:Pt:Ser/Plas:Qn:: 232 — ABNORMAL HIGH

## 2018-01-20 LAB — LACTATE BLOOD VENOUS: Lactate:SCnc:Pt:BldV:Qn:: 3.1 — ABNORMAL HIGH

## 2018-01-20 LAB — MAGNESIUM: Magnesium:MCnc:Pt:Ser/Plas:Qn:: 1.4 — ABNORMAL LOW

## 2018-01-20 LAB — O2 SATURATION VENOUS: Oxygen saturation:MFr:Pt:BldV:Qn:: 26.9 — ABNORMAL LOW

## 2018-01-20 LAB — TROPONIN I: Troponin I.cardiac:MCnc:Pt:Ser/Plas:Qn:: <0.06

## 2018-01-20 LAB — LEUKOCYTE ESTERASE UA

## 2018-01-20 NOTE — Unmapped (Signed)
LM of son [name] phone--    As per Dr Legrand Como 's instructions, prescription sent to pharmacy-[facility] 7.5 mg to begin taking as prescribed for appetite.

## 2018-01-21 ENCOUNTER — Ambulatory Visit
Admit: 2018-01-21 | Discharge: 2018-02-01 | Disposition: A | Discharge status: Home Health | Payer: MEDICARE | Admitting: Geriatric Medicine

## 2018-01-21 ENCOUNTER — Encounter
Admit: 2018-01-21 | Discharge: 2018-02-01 | Disposition: A | Discharge status: Home Health | Payer: MEDICARE | Admitting: Geriatric Medicine

## 2018-01-21 DIAGNOSIS — A419 Sepsis, unspecified organism: Principal | ICD-10-CM

## 2018-01-21 DIAGNOSIS — R578 Other shock: Secondary | ICD-10-CM | POA: Diagnosis not present

## 2018-01-21 DIAGNOSIS — J811 Chronic pulmonary edema: Secondary | ICD-10-CM | POA: Diagnosis not present

## 2018-01-21 DIAGNOSIS — R0609 Other forms of dyspnea: Secondary | ICD-10-CM | POA: Diagnosis not present

## 2018-01-21 DIAGNOSIS — R188 Other ascites: Secondary | ICD-10-CM | POA: Diagnosis not present

## 2018-01-21 DIAGNOSIS — Z794 Long term (current) use of insulin: Secondary | ICD-10-CM | POA: Diagnosis not present

## 2018-01-21 DIAGNOSIS — R531 Weakness: Secondary | ICD-10-CM | POA: Diagnosis not present

## 2018-01-21 DIAGNOSIS — I110 Hypertensive heart disease with heart failure: Secondary | ICD-10-CM | POA: Diagnosis present

## 2018-01-21 DIAGNOSIS — C49A3 Gastrointestinal stromal tumor of small intestine: Secondary | ICD-10-CM | POA: Diagnosis present

## 2018-01-21 DIAGNOSIS — R601 Generalized edema: Secondary | ICD-10-CM | POA: Diagnosis not present

## 2018-01-21 DIAGNOSIS — C784 Secondary malignant neoplasm of small intestine: Secondary | ICD-10-CM | POA: Diagnosis present

## 2018-01-21 DIAGNOSIS — C7931 Secondary malignant neoplasm of brain: Secondary | ICD-10-CM | POA: Diagnosis present

## 2018-01-21 DIAGNOSIS — K5900 Constipation, unspecified: Secondary | ICD-10-CM | POA: Diagnosis not present

## 2018-01-21 DIAGNOSIS — A4150 Gram-negative sepsis, unspecified: Secondary | ICD-10-CM | POA: Diagnosis not present

## 2018-01-21 DIAGNOSIS — D638 Anemia in other chronic diseases classified elsewhere: Secondary | ICD-10-CM | POA: Diagnosis not present

## 2018-01-21 DIAGNOSIS — R279 Unspecified lack of coordination: Secondary | ICD-10-CM | POA: Diagnosis not present

## 2018-01-21 DIAGNOSIS — R5381 Other malaise: Secondary | ICD-10-CM | POA: Diagnosis not present

## 2018-01-21 DIAGNOSIS — Z452 Encounter for adjustment and management of vascular access device: Secondary | ICD-10-CM | POA: Diagnosis not present

## 2018-01-21 DIAGNOSIS — G92 Toxic encephalopathy: Secondary | ICD-10-CM | POA: Diagnosis present

## 2018-01-21 DIAGNOSIS — N12 Tubulo-interstitial nephritis, not specified as acute or chronic: Secondary | ICD-10-CM | POA: Diagnosis not present

## 2018-01-21 DIAGNOSIS — J939 Pneumothorax, unspecified: Secondary | ICD-10-CM | POA: Diagnosis not present

## 2018-01-21 DIAGNOSIS — B961 Klebsiella pneumoniae [K. pneumoniae] as the cause of diseases classified elsewhere: Secondary | ICD-10-CM | POA: Diagnosis not present

## 2018-01-21 DIAGNOSIS — R6521 Severe sepsis with septic shock: Secondary | ICD-10-CM | POA: Diagnosis present

## 2018-01-21 DIAGNOSIS — C3490 Malignant neoplasm of unspecified part of unspecified bronchus or lung: Secondary | ICD-10-CM | POA: Diagnosis present

## 2018-01-21 DIAGNOSIS — J9811 Atelectasis: Secondary | ICD-10-CM | POA: Diagnosis not present

## 2018-01-21 DIAGNOSIS — I313 Pericardial effusion (noninflammatory): Secondary | ICD-10-CM | POA: Diagnosis present

## 2018-01-21 DIAGNOSIS — D649 Anemia, unspecified: Secondary | ICD-10-CM | POA: Diagnosis not present

## 2018-01-21 DIAGNOSIS — R791 Abnormal coagulation profile: Secondary | ICD-10-CM | POA: Diagnosis not present

## 2018-01-21 DIAGNOSIS — I2722 Pulmonary hypertension due to left heart disease: Secondary | ICD-10-CM | POA: Diagnosis present

## 2018-01-21 DIAGNOSIS — R339 Retention of urine, unspecified: Secondary | ICD-10-CM | POA: Diagnosis present

## 2018-01-21 DIAGNOSIS — B965 Pseudomonas (aeruginosa) (mallei) (pseudomallei) as the cause of diseases classified elsewhere: Secondary | ICD-10-CM | POA: Diagnosis not present

## 2018-01-21 DIAGNOSIS — E872 Acidosis: Secondary | ICD-10-CM | POA: Diagnosis present

## 2018-01-21 DIAGNOSIS — R0902 Hypoxemia: Secondary | ICD-10-CM | POA: Diagnosis not present

## 2018-01-21 DIAGNOSIS — L89152 Pressure ulcer of sacral region, stage 2: Secondary | ICD-10-CM | POA: Diagnosis present

## 2018-01-21 DIAGNOSIS — A403 Sepsis due to Streptococcus pneumoniae: Secondary | ICD-10-CM | POA: Diagnosis not present

## 2018-01-21 DIAGNOSIS — A4159 Other Gram-negative sepsis: Secondary | ICD-10-CM | POA: Diagnosis present

## 2018-01-21 DIAGNOSIS — E8809 Other disorders of plasma-protein metabolism, not elsewhere classified: Secondary | ICD-10-CM | POA: Diagnosis present

## 2018-01-21 DIAGNOSIS — B966 Bacteroides fragilis [B. fragilis] as the cause of diseases classified elsewhere: Secondary | ICD-10-CM | POA: Diagnosis not present

## 2018-01-21 DIAGNOSIS — I959 Hypotension, unspecified: Secondary | ICD-10-CM | POA: Diagnosis not present

## 2018-01-21 DIAGNOSIS — F05 Delirium due to known physiological condition: Secondary | ICD-10-CM | POA: Diagnosis not present

## 2018-01-21 DIAGNOSIS — C787 Secondary malignant neoplasm of liver and intrahepatic bile duct: Secondary | ICD-10-CM | POA: Diagnosis present

## 2018-01-21 DIAGNOSIS — R180 Malignant ascites: Secondary | ICD-10-CM | POA: Diagnosis present

## 2018-01-21 DIAGNOSIS — J439 Emphysema, unspecified: Secondary | ICD-10-CM | POA: Diagnosis not present

## 2018-01-21 DIAGNOSIS — R918 Other nonspecific abnormal finding of lung field: Secondary | ICD-10-CM | POA: Diagnosis not present

## 2018-01-21 DIAGNOSIS — R0602 Shortness of breath: Secondary | ICD-10-CM | POA: Diagnosis not present

## 2018-01-21 DIAGNOSIS — M6281 Muscle weakness (generalized): Secondary | ICD-10-CM | POA: Diagnosis not present

## 2018-01-21 DIAGNOSIS — N390 Urinary tract infection, site not specified: Secondary | ICD-10-CM | POA: Diagnosis present

## 2018-01-21 DIAGNOSIS — I4891 Unspecified atrial fibrillation: Secondary | ICD-10-CM | POA: Diagnosis present

## 2018-01-21 DIAGNOSIS — R0989 Other specified symptoms and signs involving the circulatory and respiratory systems: Secondary | ICD-10-CM | POA: Diagnosis not present

## 2018-01-21 DIAGNOSIS — J90 Pleural effusion, not elsewhere classified: Secondary | ICD-10-CM | POA: Diagnosis not present

## 2018-01-21 DIAGNOSIS — I739 Peripheral vascular disease, unspecified: Secondary | ICD-10-CM | POA: Diagnosis not present

## 2018-01-21 DIAGNOSIS — R509 Fever, unspecified: Secondary | ICD-10-CM | POA: Diagnosis not present

## 2018-01-21 DIAGNOSIS — L89154 Pressure ulcer of sacral region, stage 4: Secondary | ICD-10-CM | POA: Diagnosis present

## 2018-01-21 DIAGNOSIS — J910 Malignant pleural effusion: Secondary | ICD-10-CM | POA: Diagnosis present

## 2018-01-21 DIAGNOSIS — L89150 Pressure ulcer of sacral region, unstageable: Secondary | ICD-10-CM | POA: Diagnosis not present

## 2018-01-21 DIAGNOSIS — R000 Tachycardia, unspecified: Secondary | ICD-10-CM | POA: Diagnosis not present

## 2018-01-21 DIAGNOSIS — E119 Type 2 diabetes mellitus without complications: Secondary | ICD-10-CM | POA: Diagnosis present

## 2018-01-21 DIAGNOSIS — I5030 Unspecified diastolic (congestive) heart failure: Secondary | ICD-10-CM | POA: Diagnosis present

## 2018-01-21 DIAGNOSIS — R64 Cachexia: Secondary | ICD-10-CM | POA: Diagnosis present

## 2018-01-21 LAB — CBC W/ AUTO DIFF
HEMATOCRIT: 28.6 % — ABNORMAL LOW (ref 36.0–46.0)
HEMOGLOBIN: 9.8 g/dL — ABNORMAL LOW (ref 13.5–16.0)
MEAN CORPUSCULAR HEMOGLOBIN CONC: 34.5 g/dL (ref 31.0–37.0)
MEAN CORPUSCULAR HEMOGLOBIN: 26.1 pg (ref 26.0–34.0)
MEAN CORPUSCULAR VOLUME: 75.7 fL — ABNORMAL LOW (ref 80.0–100.0)
MEAN PLATELET VOLUME: 7.4 fL (ref 7.0–10.0)
NUCLEATED RED BLOOD CELLS: 2 /100{WBCs} (ref <=4)
PLATELET COUNT: 365 10*9/L (ref 150–440)
RED BLOOD CELL COUNT: 3.77 10*12/L — ABNORMAL LOW (ref 4.00–5.20)
RED CELL DISTRIBUTION WIDTH: 23.5 % — ABNORMAL HIGH (ref 12.0–15.0)
WBC ADJUSTED: 10.1 10*9/L (ref 4.5–11.0)

## 2018-01-21 LAB — MANUAL DIFFERENTIAL
BASOPHILS - REL (DIFF): 0 %
EOSINOPHILS - ABS (DIFF): 0 10*9/L (ref 0.0–0.4)
EOSINOPHILS - REL (DIFF): 0 %
LYMPHOCYTES - ABS (DIFF): 1.2 10*9/L — ABNORMAL LOW (ref 1.5–5.0)
LYMPHOCYTES - REL (DIFF): 12 %
MONOCYTES - REL (DIFF): 4 %
NEUTROPHILS - ABS (DIFF): 8.5 10*9/L — ABNORMAL HIGH (ref 2.0–7.5)
NEUTROPHILS - REL (DIFF): 84 %

## 2018-01-21 LAB — BASIC METABOLIC PANEL
ANION GAP: <1 mmol/L — ABNORMAL LOW (ref 7–15)
BLOOD UREA NITROGEN: 24 mg/dL — ABNORMAL HIGH (ref 7–21)
CHLORIDE: 105 mmol/L (ref 98–107)
CO2: 29 mmol/L (ref 22.0–30.0)
CREATININE: 0.94 mg/dL (ref 0.60–1.00)
EGFR CKD-EPI AA FEMALE: 64 mL/min/{1.73_m2} (ref >=60)
EGFR CKD-EPI NON-AA FEMALE: 55 mL/min/{1.73_m2} — ABNORMAL LOW (ref >=60)
GLUCOSE RANDOM: 213 mg/dL — ABNORMAL HIGH (ref 65–179)
POTASSIUM: 3.7 mmol/L (ref 3.5–5.0)
SODIUM: 134 mmol/L — ABNORMAL LOW (ref 135–145)

## 2018-01-21 LAB — CBC
HEMATOCRIT: 23.7 % — ABNORMAL LOW (ref 36.0–46.0)
HEMOGLOBIN: 8.2 g/dL — ABNORMAL LOW (ref 13.5–16.0)
MEAN CORPUSCULAR HEMOGLOBIN: 26.4 pg (ref 26.0–34.0)
MEAN CORPUSCULAR VOLUME: 76.2 fL — ABNORMAL LOW (ref 80.0–100.0)
MEAN PLATELET VOLUME: 6.9 fL — ABNORMAL LOW (ref 7.0–10.0)
NUCLEATED RED BLOOD CELLS: 1 /100{WBCs} (ref <=4)
PLATELET COUNT: 233 10*9/L (ref 150–440)
RED BLOOD CELL COUNT: 3.11 10*12/L — ABNORMAL LOW (ref 4.00–5.20)
WBC ADJUSTED: 9.3 10*9/L (ref 4.5–11.0)

## 2018-01-21 LAB — RED CELL DISTRIBUTION WIDTH: Lab: 23.5 — ABNORMAL HIGH

## 2018-01-21 LAB — LACTATE BLOOD VENOUS
Lactate:SCnc:Pt:BldV:Qn:: 1.7
Lactate:SCnc:Pt:BldV:Qn:: 1.7

## 2018-01-21 LAB — EGFR CKD-EPI AA FEMALE: Lab: 64

## 2018-01-21 LAB — RED BLOOD CELL COUNT: Lab: 3.77 — ABNORMAL LOW

## 2018-01-21 LAB — PROTIME: Lab: 23.6 — ABNORMAL HIGH

## 2018-01-21 LAB — LACTATE, VENOUS, WHOLE BLOOD: LACTATE BLOOD VENOUS: 1.7 mmol/L (ref 0.5–1.8)

## 2018-01-21 LAB — MONOCYTES - REL (DIFF): Lab: 4

## 2018-01-21 NOTE — Unmapped (Signed)
Vancomycin Therapeutic Monitoring Pharmacy Note    [name] [name] is a 84 y.o. female starting vancomycin. Date of therapy initiation: 01/20/18    Indication: Bacteremia/Sepsis    Prior Dosing Information: Vanc 1500 mg loading dose     Goals:  Therapeutic Drug Levels  Vancomycin trough goal: 15-20 mg/L    Additional Clinical Monitoring/Outcomes  Renal function, volume status (intake and output)    Results: Not applicable    Wt Readings from Last 1 Encounters:   01/16/18 58.8 kg (129 lb 9.6 oz)     Creatinine   Date Value Ref Range Status   01/20/2018 1.13 (H) 0.60 - 1.00 mg/dL Final   16/11/9602 5.40 (H) 0.60 - 1.00 mg/dL Final   98/12/9145 8.29 0.60 - 1.00 mg/dL Final        Pharmacokinetic Considerations and Significant Drug Interactions:  ? Adult (estimated initial): Vd = 41.7 L, ke = 0.0387 hr-1  ? Concurrent nephrotoxic meds: not applicable    Assessment/Plan:  Recommendation(s)  ? Start vancomycin 1000 mg Q24H with next dose 11/24 1800.  ? Estimated trough on recommended regimen: 16 mg/L    Follow-up  ? Level due: prior to fourth or fifth dose.   ? A pharmacist will continue to monitor and order levels as appropriate    Please page service pharmacist with questions/clarifications.    Dot Lanes, PharmD

## 2018-01-21 NOTE — Unmapped (Signed)
Admit team at bedside.

## 2018-01-21 NOTE — Unmapped (Signed)
Pt is awake and talking.

## 2018-01-21 NOTE — Unmapped (Signed)
[facility]  Emergency Department Provider Note    ED Clinical Impression     Final diagnoses:   Sepsis, due to unspecified organism, unspecified whether acute organ dysfunction present (CMS-HCC) (Primary)   Pyelonephritis       Initial Impression, ED Course, Assessment and Plan     [name] [name] is a 84 y.o. 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  Patient is ill-appearing, difficulty getting blood pressure but most recent reading in the 70s systolic, and atrial fibrillation with heart rate between 110 and 130.  Initially patient was not responding except to painful stimuli would moan and localized.  Now is awake alert and able to answer questions.  Family is at bedside, spoke with patient's son [name] who can be reached at [phone] and states he is the medical power of attorney, all of the family members state that the patient wants to be a full code.  Most concerning for sepsis at this time, she does have wounds on her body but none that look more infected than the other, has had left lower leg cellulitis that has been being treated but aside from edema in that leg no significant erythema or warmth.  Abdomen seems soft and without grimacing to palpation but intra-abdominal pathology is certainly possible, as his UTI.  Given her episodes of unresponsiveness will obtain a CT of the head as well.  There is also reports that she has had issues with anemia having to have blood transfusions as recently as a few weeks ago, acute anemia could certainly be contributing.  Will treat empirically with 30 cc/kg of fluids, broad-spectrum antibiotics, anticipate admission.    Additional Medical Decision Making     I have reviewed the vital signs and the nursing notes. Labs and radiology results that were available during my care of the patient were independently reviewed by me and considered in my medical decision making.     I staffed the case with the ED attending, Dr. Katrinka Blazing.      11:48 PM patient now more awake and alert, blood pressure still low with fluids running, UA shows evidence of infection, lactate 3.1, no significant anemia.  Awaiting imaging to ensure no surgical pathology, already receiving antibiotics, will require admission for underlying sepsis likely urosepsis.    12:22 AM spoke with hospitalist and discussed patient's case, agrees with plan for admission likely to the medical teaching service.    Portions of this record have been created using Scientist, clinical (histocompatibility and immunogenetics). Dictation errors have been sought, but may not have been identified and corrected.  ____________________________________________       History     Chief Complaint  Fever Between 4 Weeks and 84 Years Old      HPI   [name] [name] is a 84 y.o. 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 Patient's family reports patient developed decreased appetite over past two days. They spoke to oncologist yesterday and was prescribed Remeron which provided no improvement to her appetite. Patient is typically able to ambulate with assistance, however she was not able to get out of bed today and developed a fever of 101.3F which prompted them to bring her  to the ED. Patient's daughter notes she is typically alert, oriented, and communicative which changed this evening. Patient is not receiving chemotherapy or radiation. She is cared for at home by a nurse who changes the bandages to her multiple ulcers. She is anticoagulated on Eliquis. No history of heart failure or seizures.     Per chart review, patient was recently seen in the ED on 11/3 where she was found to be anemic with a Hgb of 7. She was given two units pRBCs and admitted. She also received antibiotics for lower extremity cellulitis.    Past Medical History:   Diagnosis Date   ??? Atrial fibrillation (CMS-HCC)    ??? Cancer (CMS-HCC)    ??? Diabetes mellitus (CMS-HCC)    ??? Hypertension    ??? Metastatic lung cancer (metastasis from lung to other site) (CMS-HCC)    ??? Pulmonary embolism (CMS-HCC)        Patient Active Problem List   Diagnosis   ??? SOB (shortness of breath)   ??? Hypoxemia   ??? Acute pulmonary embolism (CMS-HCC)   ??? HTN (hypertension)   ??? Diabetes mellitus, type 2 (CMS-HCC)   ??? Metastatic lung cancer (metastasis from lung to other site), unspecified laterality (CMS-HCC)   ??? Addison anemia   ??? Lipoma of back   ??? Weight loss   ??? Lump of breast, right   ??? Pernicious anemia   ??? Anemia   ??? Need for shingles vaccine   ??? Pleural effusion   ??? Chest pain on breathing   ??? T12 compression fracture (CMS-HCC)   ??? Dyspepsia   ??? Thyroid nodule   ??? Hematuria   ??? Pressure injury of sacral region, stage 2 (CMS-HCC)   ??? Visit for suture removal   ??? Knee laceration, left, subsequent encounter   ??? UTI symptoms   ??? Iron deficiency anemia   ??? Urinary retention   ??? Cellulitis       Past Surgical History:   Procedure Laterality Date   ??? CHG RAD GUIDED,PERCUT DRAINAGE,W/CATH PLACE Right 06/19/2017    Procedure: RADIOLOGICAL GUIDANCE, FOR PERCUTANEOUS DRAINAGE, WITH PLACEMENT OF CATHETER, RAD S&I;  Surgeon: Sherwood Gambler, MD;  Location: BRONCH PROCEDURE LAB [facility];  Service: Pulmonary   ??? CHOLECYSTECTOMY     ??? HYSTERECTOMY     ??? neck surgey     ??? PR INSERTION INDWELLING TUNNELED PLEURAL CATHETER Bilateral 06/19/2017    Procedure: INSERTION OF INDWELLING TUNNELED PLEURAL CATHETER WITH CUFF WITH MODERATE SEDATION;  Surgeon: Sherwood Gambler, MD;  Location: BRONCH PROCEDURE LAB [facility];  Service: Pulmonary   ??? PR INSERTION INDWELLING TUNNELED PLEURAL CATHETER Left 10/26/2017    Procedure: INSERTION OF INDWELLING TUNNELED PLEURAL CATHETER WITH CUFF;  Surgeon: Mercy Moore, MD;  Location: MAIN OR [facility]; Service: Pulmonary   ??? PR MOD SED SAME PHYS/QHP EACH ADDL 15 MINS  06/19/2017    Procedure: MODERATE SEDATION SERVICES PROVIDED BY SAME PHYSICIAN OR OTHER QUALIFIED HEALTH CARE PROFESSIONAL PERFORMING THE DIAGNOSTIC OR THERAPEUTIC SERVICE THAT SEDATION SUPPORTS, EACH ADDITIONAL 15 MINS;  Surgeon: Sherwood Gambler, MD;  Location: BRONCH PROCEDURE LAB [facility];  Service: Pulmonary   ??? PR MOD SED SAME PHYS/QHP INITIAL 15 MINS 5/> YRS  06/19/2017    Procedure: MODERATE SEDATION SERVICES PROVIDED BY SAME PHYSICIAN OR OTHER QUALIFIED HC PROFESSIONAL PERFORMING THE DIAGNOSTIC OR THERAPEUTIC SERVICE THAT SEDATION SUPPORTS, INIT 15 MINS, PT AGE 35 YEARS OR OLDER;  Surgeon: Sherwood Gambler, MD;  Location: BRONCH PROCEDURE LAB [facility];  Service: Pulmonary         Current Facility-Administered Medications:   ???  magnesium sulfate in water 2 gram/50 mL (4 %) IVPB 2 g, 2 g, Intravenous, Once, Retia Passe, MD    Current Outpatient Medications:   ???  blood sugar diagnostic (GLUCOSE BLOOD) Strp, Check blood sugar 4 times daily., Disp: 100 each, Rfl: 3  ???  blood sugar diagnostic Strp, Check 4x/day as instructed, Disp: 120 strip, Rfl: 5  ???  blood-glucose meter kit, Use as instructed, Disp: 1 each, Rfl: 0  ???  blood-glucose meter Misc, 1 each by Miscellaneous route Four (4) times a day. for 1 day, Disp: 1 each, Rfl: 1  ???  calcium carbonate-vitamin D3 (CALTRATE 600 + D) 600 mg (1,500 mg)-800 unit Chew, Chew 1 tablet Two (2) times a day., Disp: , Rfl:   ???  crizotinib (XALKORI) 250 mg capsule, Take 1 capsule (250 mg total) by mouth Two (2) times a day., Disp: 60 capsule, Rfl: 3  ???  ELIQUIS 5 mg Tab, Take 5 mg by mouth Two (2) times a day. TAKE 1 TABLET (5 MG TOTAL) BY MOUTH TWO (2) TIMES A DAY., Disp: , Rfl: 3  ???  ferrous sulfate 325 (65 FE) MG EC tablet, Take 1 tablet (325 mg total) by mouth daily., Disp: 90 tablet, Rfl: 3  ???  furosemide (LASIX) 20 MG tablet, Take 20 mg by mouth daily as needed for swelling., Disp: , Rfl:   ???  insulin aspart protamine-insulin aspart (NOVOLOG MIX 70-30FLEXPEN U-100) 100 unit/mL (70-30) injection pen, Inject 0.08 mL (8 Units total) under the skin two (2) times a day., Disp: 15 mL, Rfl: 0  ???  lancets (ACCU-CHEK SOFTCLIX LANCETS) Misc, Use 4 times daily., Disp: 100 each, Rfl: 3  ???  lancets Misc, 1 each by Miscellaneous route Four (4) times a day., Disp: 200 each, Rfl: 4  ???  lidocaine 4 % patch, Place 1 patch on the skin daily., Disp: , Rfl:   ???  metFORMIN (GLUCOPHAGE-XR) 500 MG 24 hr tablet, TAKE 2 TABLETS (1,000 MG TOTAL) BY MOUTH TWO (2) TIMES A DAY., Disp: 360 tablet, Rfl: 3  ???  mirtazapine (REMERON) 7.5 MG tablet, Take 1 tablet (7.5 mg total) by mouth nightly., Disp: 30 tablet, Rfl: 3  ???  pen needle, diabetic (BD ULTRA-FINE SHORT PEN NEEDLE) 31 gauge x 5/16 Ndle, USE AS DIRECTED. USE 4 NEEDLES A DAY WITH DIABETIC MEDICATION PENS AS DIRECTED, Disp: , Rfl:   ???  potassium chloride SA (K-DUR,KLOR-CON) 20 MEQ tablet, Take 10 mEq by mouth Two (2) times a day. Take 1/2 tablet (10 mEq) BID, Disp: , Rfl:   ???  ranitidine (ZANTAC) 150 MG tablet, Take 150 mg by mouth daily., Disp: , Rfl:   ???  simethicone (GAS-X) 80 MG chewable tablet, Chew 1 tablet (80 mg total) 4 (four) times a day as needed for flatulence (gas relief)., Disp: 100 tablet, Rfl: 2    Allergies  Sulfasalazine    Family History   Problem Relation Age of Onset   ??? Cancer Brother         lung   ??? Cancer Daughter         lung   ??? Cancer Daughter         breast       Social History  Social History     Tobacco Use   ??? Smoking status: Never Smoker   ??? Smokeless tobacco: Never Used   Substance  Use Topics   ??? Alcohol use: No   ??? Drug use: Never       Review of Systems  Constitutional: Positive for fever and decreased appetite.  Eyes: Negative for visual changes.  ENT: Negative for sore throat.  Cardiovascular: Negative for chest pain.  Respiratory: Negative for shortness of breath.  Gastrointestinal: Negative for abdominal pain  Genitourinary: Negative for dysuria. Musculoskeletal: Negative for back pain.  Skin: Negative for rash.  Neurological: Negative for headaches    Unable to obtain a comprehensive history/ROS due to: altered mental status.      Physical Exam     ED Triage Vitals [01/20/18 2203]   Enc Vitals Group      BP 195/138      Heart Rate 117      SpO2 Pulse       Resp 18      Temp 38.3 ??C (101 ??F)      Temp Source Oral     Constitutional: Cachectic and ill appearing. Lethargic. Will localize and move to pain.   Eyes: Conjunctivae are normal.  ENT       Head: Normocephalic       Nose: No congestion.       Mouth/Throat: Mucous membranes are dry.       Neck: No stridor.  Hematological/Lymphatic/Immunilogical: No cervical lymphadenopathy.  Cardiovascular: Tachycardic rate, irregularly irregular rhythm  Respiratory: Normal respiratory effort. Breath sounds are normal.  Gastrointestinal: Soft and nontender. No grimacing to palpation.   Musculoskeletal: 2+ edema to LLE, swollen compared to the right. No warmth.   Neurologic: Normal speech and language. No gross focal neurologic deficits are appreciated.  Skin: Bandage to left knee, right flank, and sacrum.   Psychiatric: Mood and affect are normal.     EKG     Rate: 107  Rhythm: afib  QTc: 571  Impression: Atrial fibrillation, rapid ventricular response, LAD, right bundle branch block, no new ischemic changes      Radiology     XR Chest 2 views   Preliminary Result   -- Small left pleural effusion, decreased from prior, with decreased to resolved pneumothorax component.      CT Head Wo Contrast   Preliminary Result   - No acute intracranial abnormality.    - Unchanged appearance of sclerotic left frontotemporal calvarial metastatic lesion.         CT Abdomen Pelvis with IV Contrast ONLY    (Results Pending)       ______________________________________________________   Documentation assistance was provided by Rolan Lipa, Scribe, on January 20, 2018 at 10:18 PM for Dorise Hiss, MD.      January 20, 2018 10:00 PM. Documentation assistance provided by the scribe. I was present during the time the encounter was recorded. The information recorded by the scribe was done at my direction and has been reviewed and validated by me.      Retia Passe, MD  Resident  01/21/18 [phone]

## 2018-01-21 NOTE — Unmapped (Signed)
Pt arrived from ED just before 0200. Pt oriented to self. Pt with soft pressures.  1L bolus given.  Pressures still soft after bolus.  Norepi started and albumin administered.  Foley placed for urine retention.  Urine is hazy and with sediment.  No c/o pain except when rolling patient.  Pt with sacral decub on admission.  Wound consult placed, wound cleansed and new dressing applied.  Pt turned Q2.  Pt now ICU status. Report given to oncoming nurse.   Problem: Adult Inpatient Plan of Care  Goal: Plan of Care Review  Outcome: Not Progressing  Goal: Patient-Specific Goal (Individualization)  Outcome: Not Progressing  Goal: Absence of Hospital-Acquired Illness or Injury  Outcome: Not Progressing  Goal: Optimal Comfort and Wellbeing  Outcome: Not Progressing  Goal: Readiness for Transition of Care  Outcome: Not Progressing  Goal: Rounds/Family Conference  Outcome: Not Progressing     Problem: Skin Injury Risk Increased  Goal: Skin Health and Integrity  Outcome: Not Progressing     Problem: Self-Care Deficit  Goal: Improved Ability to Complete Activities of Daily Living  Outcome: Not Progressing     Problem: Fall Injury Risk  Goal: Absence of Fall and Fall-Related Injury  Outcome: Not Progressing     Problem: Diabetes Comorbidity  Goal: Blood Glucose Level Within Desired Range  Outcome: Not Progressing     Problem: Hypertension Comorbidity  Goal: Blood Pressure in Desired Range  Outcome: Not Progressing

## 2018-01-21 NOTE — Unmapped (Signed)
Pt brought to room in wheel chair by na, pt is unresponsive, slumped in chair. Pt lifted onto stretcher. Incontinent. Pt has multiple area of skin break down, 1 to saccrum, right flank area, and left knee, wound have dressings over them.

## 2018-01-21 NOTE — Unmapped (Signed)
First intros with Bevely Palmer.  Patient is calm and resting quietly.  Patient currently voices no concerns or complaints at this time.  Family at bedside

## 2018-01-21 NOTE — Unmapped (Addendum)
PHYSICAL THERAPY  Evaluation (01/21/18 1155)     Patient Name:  [name] [name]       Medical Record Number: [id number]   Date of Birth: 04/13/1930  Sex: Female            Treatment Diagnosis: generalized deconditioning and impaired functional mobility in the setting of acute hospital admission for fever and weakness.     ASSESSMENT    [name] [name] is a 84 y.o. female with PMHx of metastatic NSCLC (Stage IV, on crizotinib) c/b b/l malignant effusions s/p bilateraly pleurX catheters, recent hospitalization earlier this month for cellulitis, hx multiple LE ulcers, AFib on elequis, that presented to [facility] with fever and weakness. Pt presents with generalized deconditioning and weakness, new supplemental oxygen needs, decreased activity tolerance, impaired balance, and impaired transfers. Pt required substantial physical assistance for bed mobility this session. Unable to assess further mobility this session secondary to pt being hypotensive in sitting. Based on the AM-PAC 5 item raw score of 8, the patient is considered to be 77% impaired with basic mobility. This indicates the pt is appropriate for continued PT services 5x/week (low intensity) or 3x/week with constant supervision/physical assist available post-acute. Pt is a moderate complexity eval given impact of pt's personal factors and medical condition on pt's functional mobility and required clinical reasoning.       Today's Interventions: AMPAC: 8/20. PT eval, pt/family education re: role of PT, POC, bed mobility, continuous vitals monitoring, importance of upright positioning and OOB activity, discussion w/ family of pt's current level of assist for safety w/ functional mobility and recommendations for pt assistance 24/7 for safety.     Activity Tolerance: Patient limited by fatigue;Treatment limited secondary to medical complications(hypotension)    PLAN  Planned Frequency of Treatment:  1-2x per day for: 3-4x week      Planned Interventions: Education - Patient;Education - Family / caregiver;Home exercise program;Therapeutic exercise;Therapeutic activity;Balance activities;Functional mobility;Endurance activities;Postural re-education;Self-care / Home training;Transfer training    Post-Discharge Physical Therapy Recommendations:  5x weekly;Low intensity(vs. 3x/week w/ 24/7 physical assistance)        PT DME Recommendations: Defer to post acute     Goals:   Patient and Family Goals: pt's daughter reports they want to take the pt home     Long Term Goal #1: TBD pending OOB mobility assessment        SHORT GOAL #1: Pt will complete bed mobility w/ min A               Time Frame : 1 week  SHORT GOAL #2: Pt will tolerate 20 mins sitting EOB w/ SBA               Time Frame : 1 week  SHORT GOAL #3: Pt will participate in OOB mobility assessment               Time Frame : 1 week                                        Prognosis:  Good  Barriers to Discharge: Functional strength deficits;Endurance deficits;Cognitive deficits;Gait instability;Impaired balance;Decreased safety awareness;Poor insight into deficits;Inaccessible home environment  Positive Indicators: family support    SUBJECTIVE  Patient reports: Pt/RN agreeable to therapy; pt w/ limited verbal interaction during session, pt's daughter assisted w/ PLOF info  Current Functional Status: Pt received semi-reclined in bed. At  the end of the session, pt was left semi-reclined in bed, NAD, all needs in reach, family at bedside, and RN ([name] [name]) aware.   Services patient receives: PT(HHPT 2x/week)  Prior functional status: Pt uses RW/rollator w/ CGA for household distances, uses transport chair for community distances and sometimes in the home. Pt was receiving HHPT 2x/week PTA, pt's daughter reports one fall a few months ago.   Equipment available at home: [employer]    Past Medical History:   Diagnosis Date   ??? Atrial fibrillation (CMS-HCC)    ??? Cancer (CMS-HCC)    ??? Diabetes mellitus (CMS-HCC) ??? Hypertension    ??? Metastatic lung cancer (metastasis from lung to other site) (CMS-HCC)    ??? Pulmonary embolism (CMS-HCC)     Social History     Tobacco Use   ??? Smoking status: Never Smoker   ??? Smokeless tobacco: Never Used   Substance Use Topics   ??? Alcohol use: No      Past Surgical History:   Procedure Laterality Date   ??? CHG RAD GUIDED,PERCUT DRAINAGE,W/CATH PLACE Right 06/19/2017    Procedure: RADIOLOGICAL GUIDANCE, FOR PERCUTANEOUS DRAINAGE, WITH PLACEMENT OF CATHETER, RAD S&I;  Surgeon: [name] Gambler, MD;  Location: BRONCH PROCEDURE LAB [facility];  Service: Pulmonary   ??? CHOLECYSTECTOMY     ??? HYSTERECTOMY     ??? neck surgey     ??? PR INSERTION INDWELLING TUNNELED PLEURAL CATHETER Bilateral 06/19/2017    Procedure: INSERTION OF INDWELLING TUNNELED PLEURAL CATHETER WITH CUFF WITH MODERATE SEDATION;  Surgeon: [name] Gambler, MD;  Location: BRONCH PROCEDURE LAB [facility];  Service: Pulmonary   ??? PR INSERTION INDWELLING TUNNELED PLEURAL CATHETER Left 10/26/2017    Procedure: INSERTION OF INDWELLING TUNNELED PLEURAL CATHETER WITH CUFF;  Surgeon: [name] Moore, MD;  Location: MAIN OR [facility];  Service: Pulmonary   ??? PR MOD SED SAME PHYS/QHP EACH ADDL 15 MINS  06/19/2017    Procedure: MODERATE SEDATION SERVICES PROVIDED BY SAME PHYSICIAN OR OTHER QUALIFIED HEALTH CARE PROFESSIONAL PERFORMING THE DIAGNOSTIC OR THERAPEUTIC SERVICE THAT SEDATION SUPPORTS, EACH ADDITIONAL 15 MINS;  Surgeon: [name] Gambler, MD;  Location: BRONCH PROCEDURE LAB [facility];  Service: Pulmonary   ??? PR MOD SED SAME PHYS/QHP INITIAL 15 MINS 5/> YRS  06/19/2017    Procedure: MODERATE SEDATION SERVICES PROVIDED BY SAME PHYSICIAN OR OTHER QUALIFIED HC PROFESSIONAL PERFORMING THE DIAGNOSTIC OR THERAPEUTIC SERVICE THAT SEDATION SUPPORTS, INIT 15 MINS, PT AGE 13 YEARS OR OLDER;  Surgeon: [name] Gambler, MD;  Location: BRONCH PROCEDURE LAB [facility];  Service: Pulmonary    Family History   Problem Relation Age of Onset   ??? Cancer Brother lung   ??? Cancer Daughter         lung   ??? Cancer Daughter         breast        Allergies: Sulfasalazine                Objective Findings              Precautions: Falls;Isolation precautions(droplet isolation )              Weight Bearing Status: Non- applicable              Required Braces or Orthoses: Non- applicable    Communication Preference: Verbal(pt w/ limited verbal interaction during session)  Pain Comments: Pt did not c/o pain during activity      Equipment / Environment: Supplemental oxygen;Vascular access (PIV, TLC,  Port-a-cath, PICC);Foley;Telemetry(2L via [place])    At Rest: VSS per EPIC  With Activity: VSS per monitor   Orthostatics: semi-reclined in bed prior to mobilization BP=113/53 (65), seated EOB BP=72/54 (57) pt asymptomatic, pt returned to supine BP=90/58 (66)        Living environment: House  Lives With: Family(lives w/ daughter, multiple family members live nearby)  Home Living: One level home;Stairs to enter with rails  Rail placement (outside): Bilateral rails     Number of Stairs: 3    Cognition: oriented to self and family, pt w/ limited verbal interaction and initiation throughout session, unclear if d/t AMS or poor motivation to participate           UE ROM: WFL-see OT eval for details  UE Strength: Generally weak bilaterally, see OT eval for details   LE ROM: WFL  LE Strength: grossly 3/5 bilaterally                        Sensation: pt denies N/T throughout  Balance: Pt is mod A for sitting balance at EOB x10 mins, demo's L lateral lean    Posture: forward flexed posture in sitting, L lateral lean      Bed Mobility: Pt completed sup<>Sit w/ max A, pt w/ limited initiation and engagement in bed mobility, improved w/ family encouragement.   Transfers: not assessed secondary to pt's hypotension w/ sitting EOB                   Eval Duration(PT): 27 Min.    Medical Staff Made Aware: RN ([name] [name] ) aware.      I attest that I have reviewed the above information.  Signed: Sung Amabile, PT Filed 01/21/2018

## 2018-01-21 NOTE — Unmapped (Signed)
PULMONARY CONSULT  NOTE      Patient: Tyree V Willow(01-21-1931)  Reason for consultation: Ms. Tegtmeyer is a 84 y.o. female who is seen in consultation at the request of Theodosia Blender,* for comprehensive evaluation of septic shock.    Assessment and Recommendations:      Principal Problem:    Sepsis (CMS-HCC)  Active Problems:    Diabetes mellitus, type 2 (CMS-HCC)    Metastatic lung cancer (metastasis from lung to other site), unspecified laterality (CMS-HCC)    Pressure injury of sacral region, stage 2 (CMS-HCC)    Urinary tract infection  Resolved Problems:    * No resolved hospital problems. *    Patient clearly with mild distributive shock. Suspect urinary source for sepsis, but sacral wound could also be contributing. Continuing pressors and adding midodrine. Continuing IV antimicrobials.    We appreciate the opportunity to assist in the care of this patient.  Please page [phone] with any questions.    Lavella Hammock, MD    Subjective:      History of Present Illness:  Ms. Macbride is a 84 y.o. female with a history of metastatic NSCLC on crizotinib and chronic bilateral malignant pleural effusions admitted for septic shock from presumed urinary source vs. Skin or soft tissue infection. Has received vancomycin and cefepime and adequate fluid resuscitation.    Review of Systems: A comprehensive review of systems was performed and was negative except as above in HPI  Past Medical History:   Diagnosis Date   ??? Atrial fibrillation (CMS-HCC)    ??? Cancer (CMS-HCC)    ??? Diabetes mellitus (CMS-HCC)    ??? Hypertension    ??? Metastatic lung cancer (metastasis from lung to other site) (CMS-HCC)    ??? Pulmonary embolism (CMS-HCC)      Past Surgical History:   Procedure Laterality Date   ??? CHG RAD GUIDED,PERCUT DRAINAGE,W/CATH PLACE Right 06/19/2017    Procedure: RADIOLOGICAL GUIDANCE, FOR PERCUTANEOUS DRAINAGE, WITH PLACEMENT OF CATHETER, RAD S&I;  Surgeon: Sherwood Gambler, MD;  Location: BRONCH PROCEDURE LAB [facility];  Service: Pulmonary   ??? CHOLECYSTECTOMY     ??? HYSTERECTOMY     ??? neck surgey     ??? PR INSERTION INDWELLING TUNNELED PLEURAL CATHETER Bilateral 06/19/2017    Procedure: INSERTION OF INDWELLING TUNNELED PLEURAL CATHETER WITH CUFF WITH MODERATE SEDATION;  Surgeon: Sherwood Gambler, MD;  Location: BRONCH PROCEDURE LAB [facility];  Service: Pulmonary   ??? PR INSERTION INDWELLING TUNNELED PLEURAL CATHETER Left 10/26/2017    Procedure: INSERTION OF INDWELLING TUNNELED PLEURAL CATHETER WITH CUFF;  Surgeon: Mercy Moore, MD;  Location: MAIN OR [facility];  Service: Pulmonary   ??? PR MOD SED SAME PHYS/QHP EACH ADDL 15 MINS  06/19/2017    Procedure: MODERATE SEDATION SERVICES PROVIDED BY SAME PHYSICIAN OR OTHER QUALIFIED HEALTH CARE PROFESSIONAL PERFORMING THE DIAGNOSTIC OR THERAPEUTIC SERVICE THAT SEDATION SUPPORTS, EACH ADDITIONAL 15 MINS;  Surgeon: Sherwood Gambler, MD;  Location: BRONCH PROCEDURE LAB [facility];  Service: Pulmonary   ??? PR MOD SED SAME PHYS/QHP INITIAL 15 MINS 5/> YRS  06/19/2017    Procedure: MODERATE SEDATION SERVICES PROVIDED BY SAME PHYSICIAN OR OTHER QUALIFIED HC PROFESSIONAL PERFORMING THE DIAGNOSTIC OR THERAPEUTIC SERVICE THAT SEDATION SUPPORTS, INIT 15 MINS, PT AGE 12 YEARS OR OLDER;  Surgeon: Sherwood Gambler, MD;  Location: BRONCH PROCEDURE LAB [facility];  Service: Pulmonary     Medications reviewed in Epic  Allergies as of 01/20/2018 - Reviewed 01/20/2018   Allergen Reaction Noted   ???  Sulfasalazine Nausea Only 12/22/2014     Family History   Problem Relation Age of Onset   ??? Cancer Brother         lung   ??? Cancer Daughter         lung   ??? Cancer Daughter         breast     Social History     Tobacco Use   ??? Smoking status: Never Smoker   ??? Smokeless tobacco: Never Used   Substance Use Topics   ??? Alcohol use: No        Objective:      Physical Exam:  Vitals:    01/21/18 1140 01/21/18 1150 01/21/18 1200 01/21/18 1210   BP: 118/52 103/69 113/53 175/142   Pulse: 79 95 78 72   Resp: 13 14 18 16    Temp:       TempSrc:       SpO2: 100% 100% 100% 100%   Weight:         General: Alert, well-appearing, and in no distress.  Eyes: Anicteric sclera, conjunctiva clear.  ENT:  Mucous membranes moist and intact.  Lymph: No cervical or supraclavicular adenopathy.  Lungs: Normal excursion, no dullness to percussion. Good air movement bilaterally, without wheezes or crackles. Normal upper airway sounds without evidence of stridor.  Cardiovascular: Regular rate and rhythm, S1, S2 normal, no murmur, click, rub or gallop appreciated.  Abdomen: Soft, non-tender, not distended, bowel sounds are normal, liver is not enlarged, spleen is not enlarged  Musculoskeletal: No clubbing and no synovitis.  Skin: No rashes or lesions.  Neuro: No focal neurological deficits.    Malnutrition Assessment by RD:          Diagnostic Review:   All labs and images were personally reviewed.

## 2018-01-21 NOTE — Unmapped (Signed)
OCCUPATIONAL THERAPY  Evaluation (01/21/18 1053)    Patient Name:  [name] [name]       Medical Record Number: [id number]   Date of Birth: 11/17/1930  Sex: Female          OT Treatment Diagnosis:  decreased: functional mobility, ability to perform functional transfers, activity tolerance, and functional cognition, all impacting daily routines, here with fever and weakness.     Assessment  Pt presents to [facility] as an 84 y.o. female with PMHx of metastatic NSCLC (Stage IV, on crizotinib) c/b b/l malignant effusions s/p bilateraly pleurX catheters, recent hospitalization earlier this month for cellulitis, hx multiple LE ulcers, AFib on elequis, that presented to [facility] with fever and weakness, per H&P.     Pt presents with decreased: functional mobility, ability to perform functional transfers, activity tolerance, and functional cognition, all impacting her ability to perform self care tasks at [facility]. At baseline, pt performs ADLs with mobility assistance from family members and uses a rollator (i.e. family walkd with pt to bathroom then leaves her alone until she is ready to stand up; family assists pt getting to shower chair, then pt bathes herself). Pt benefits from skilled OT services in order to maximize ADL participation and ensure safety with all transfers/tasks.     Based on the daily activity AMPAC raw score of  12/24, the pt is considered to be 66.57% impaired with self care. This indicates that 5xL is recommended.  HIGH level of complexity due to PMH, clinical judgment, and deficits noted.          Activity Tolerance During Today's Session  Patient tolerated treatment well    Plan  Planned Frequency of Treatment:  1-2x per day for: 2-3x week       Planned Interventions:  Adaptive equipment;ADL retraining;Balance activities;Bed mobility;Compensatory tech. training;Conservation;Education - Patient;Endurance activities;Education - Family / caregiver;Functional cognition;Functional mobility;Positioning;Safety education;UE Strength / coordination exercise    Post-Discharge Occupational Therapy Recommendations:  OT Post Acute Discharge Recommendations: 5x weekly;Low intensity    ;         GOALS:        Long Term Goal #1: TBD       Short Term:  Patient will perform out-of-bed assessment to promote further ADL engagement   Time Frame : 5 days  pt will perform simple grooming task seated EOB with supervision to promote upright tolerance and sitting balance   Time Frame : 1 week    Prognosis:  Fair  Positive Indicators:  family support, PLOF  Barriers to Discharge: Cognitive deficits;Endurance deficits;Inability to safely perform ADLS;Impaired Balance    Subjective  Current Status Pt was recieved and left in bed with family present and call bell in reach.  Prior Functional Status At baseline, pt performs ADLs with mobility assistance from family members and uses a Secondary school teacher (i.e. family would walk with pt to bathroom then leave her alone until she was ready to stand up; family assists pt getting to shower chair, then pt bathes herself). Family reports that they encourage pt to do as much on her own as possible.     Medical Tests / Procedures: reviewed in Epic  Services patient receives: PT(HHPT 2x/week)  Patient / Caregiver reports: Son: We try to let her do as much for herself as possible    Past Medical History:   Diagnosis Date   ??? Atrial fibrillation (CMS-HCC)    ??? Cancer (CMS-HCC)    ??? Diabetes mellitus (CMS-HCC)    ???  Hypertension    ??? Metastatic lung cancer (metastasis from lung to other site) (CMS-HCC)    ??? Pulmonary embolism (CMS-HCC)     Social History     Tobacco Use   ??? Smoking status: Never Smoker   ??? Smokeless tobacco: Never Used   Substance Use Topics   ??? Alcohol use: No      Past Surgical History:   Procedure Laterality Date   ??? CHG RAD GUIDED,PERCUT DRAINAGE,W/CATH PLACE Right 06/19/2017    Procedure: RADIOLOGICAL GUIDANCE, FOR PERCUTANEOUS DRAINAGE, WITH PLACEMENT OF CATHETER, RAD S&I;  Surgeon: Sherwood Gambler, MD;  Location: BRONCH PROCEDURE LAB [facility];  Service: Pulmonary   ??? CHOLECYSTECTOMY     ??? HYSTERECTOMY     ??? neck surgey     ??? PR INSERTION INDWELLING TUNNELED PLEURAL CATHETER Bilateral 06/19/2017    Procedure: INSERTION OF INDWELLING TUNNELED PLEURAL CATHETER WITH CUFF WITH MODERATE SEDATION;  Surgeon: Sherwood Gambler, MD;  Location: BRONCH PROCEDURE LAB [facility];  Service: Pulmonary   ??? PR INSERTION INDWELLING TUNNELED PLEURAL CATHETER Left 10/26/2017    Procedure: INSERTION OF INDWELLING TUNNELED PLEURAL CATHETER WITH CUFF;  Surgeon: Mercy Moore, MD;  Location: MAIN OR [facility];  Service: Pulmonary   ??? PR MOD SED SAME PHYS/QHP EACH ADDL 15 MINS  06/19/2017    Procedure: MODERATE SEDATION SERVICES PROVIDED BY SAME PHYSICIAN OR OTHER QUALIFIED HEALTH CARE PROFESSIONAL PERFORMING THE DIAGNOSTIC OR THERAPEUTIC SERVICE THAT SEDATION SUPPORTS, EACH ADDITIONAL 15 MINS;  Surgeon: Sherwood Gambler, MD;  Location: BRONCH PROCEDURE LAB [facility];  Service: Pulmonary   ??? PR MOD SED SAME PHYS/QHP INITIAL 15 MINS 5/> YRS  06/19/2017    Procedure: MODERATE SEDATION SERVICES PROVIDED BY SAME PHYSICIAN OR OTHER QUALIFIED HC PROFESSIONAL PERFORMING THE DIAGNOSTIC OR THERAPEUTIC SERVICE THAT SEDATION SUPPORTS, INIT 15 MINS, PT AGE 72 YEARS OR OLDER;  Surgeon: Sherwood Gambler, MD;  Location: BRONCH PROCEDURE LAB [facility];  Service: Pulmonary    Family History   Problem Relation Age of Onset   ??? Cancer Brother         lung   ??? Cancer Daughter         lung   ??? Cancer Daughter         breast        Sulfasalazine     Objective Findings  Precautions / Restrictions  Falls precautions;Isolation precautions(Rule out for flu; protective and chemo precautions)    Weight Bearing  Non-applicable    Required Braces or Orthoses  Non-applicable    Communication Preference  Verbal;Visual    Pain  no c/o pain    Equipment / Environment  Supplemental oxygen;Vascular access (PIV, TLC, Port-a-cath, PICC);Telemetry;Foley(2L [place])    Living Situation   Living environment: House   Lives With: Family(lives w/ daughter, multiple family members live nearby)   Home Living: One level home;Stairs to enter with rails   Equipment available at home: Dentist placement (outside): Bilateral rails     Cognition   Orientation Level:  (unable to fully assess)   Arousal/Alertness:  Inconsistent responses to stimuli   Attention Span:  Difficulty attending to directions   Memory:  Unable to assess   Following Commands:  Not following commands;Follows one step commands with repetition   Safety Judgment:  Unable to assess   Awareness of Errors:  Unable to assess   Problem Solving:  Assistance required to generate solutions;Assistance required to implement solutions   Comments: Pt was inconsistenly responsive to mobility  cues and questions. Unable to assess if this was due to lack of motivation/interest vs. inability to initiate vs. challenges attending to present situation.    Vision / Perception  Vision: Wears glasses for reading only(glasses for reading, tv)    Hand Function  Hand Dominance: right handed  [facility]    Skin Inspection  Some scabbing on back and arms. Sacral wound covered.    ROM / Strength/Coordination  UE ROM/ Strength/ Coordination: ROM: not yet assessed. Strength: WFL. Coordination: appears intact (as evidenced by use of washcloth to wipe face).   LE ROM/ Strength/ Coordination: Defer to PT    Sensation:  BLE grossly intact    Balance:  Sitting (static): Pt sat EOB for <2 min with SBA/CGA + lots of encouragement from OT and family, leaning heavily to right side. Pt eventually laid UB back down due to fatigue. Standing: Pt did not stand.    Mobility/Gait/Transfers: Bed mobility: pt t/f from HOB ~40 deg <> EOB with mod A x2 (supports provided: 2-hand assist for UB, moving legs to/from EOB).     ADL:  Bathing: Pt washed face with warm washcloth seated in bed with HOB ~50 deg, with setup + repeated verbal and visual cues to initiate action.  Eating: Son reported that pt fed herself this morning (with setup)    Vitals/ Orthostatics:  At Rest: HR 80, SpO2 100% on 2L, BP 103/36(59), RN approval  With Activity: NAD        Interventions Performed During Today's Session: OT educated pt on OT role and POC. OT supported pt in maximizing participation in ADLs by facilitating: safe bed mobility to prepare for ADLs energy conservation strategies (frequent rest breaks), *See ADLs and Functional Mobility for more detail.    Eval Duration (OT): 29 Min.    Medical Staff Made Aware: RN Estil Daft    I attest that I have reviewed the above information.  Signed: Leoma Folds Frances Maywood, OT  Filed 01/21/2018      I was physically present and immediately available to direct and supervise tasks that were related to patient management. The direction and supervision was continuous throughout the time these tasks were performed.     Kenzly Rogoff Frances Maywood, OT

## 2018-01-21 NOTE — Unmapped (Addendum)
Outpatient Provider Follow Up Issues:  - Continue to address GOC; pt has clear preference to avoid hospital/health care facilities and most values being at home with her grandson, though remains on aggressive medical pathway at this time  - Pt discharged with Foley, will need outpatient TOV  - Levofloxacin last day 12/6, Flagyl last day 12/8    Hospital Course:  [name] [name] is a 84 y.o. female with PMHx of metastatic NSCLC (Stage IV, on crizotinib) c/b b/l malignant effusions s/p bilateraly pleurX catheters, recent hospitalization earlier this month for cellulitis, hx multiple LE ulcers, AFib on elequis, that presented to [facility] with septic shock 2/2 Klebsiella pneumoniae and Bacteroides fragilis bacteremia.     Septic shock, K. Pneumoniae and B. Fragilis bacteremia: Patient brought to the ED by family for fever to 101.6.  In the ED, she was febrile, hypotensive and tachycardic with lactic acid of 3.1, UA with greater than 100 WBCs, chest x-ray without new consolidation, CT head without acute change, and CT AP with redemonstration of stable mesenteric mass, slightly enlarged hepatic lobe mass, right lower renal pole mass and moderate volume ascites.  She was started on vancomycin and cefepime and given sepsis fluid bolus with improvement of her lactic acid.  She continued to be hypotensive and required low dose norepinephrine to maintain blood pressure.  AM cortisol >20. She was given one dose of tobramycin. Urine culture grew Pseudomonas aeruginosa and blood cultures grew Klebsiella pneumoniae and Bacteroides fragilis.  Metronidazole was added.  Source thought to be secondary to gut translocation in the setting of likely malignant ascites and abdominal metastases.  Patient had persistent hypotension in her left radial arterial line, but as blood pressures were good in the right arm and lactate off of norepinephrine was 0.5, arterial line was removed and norepinephrine was discontinued.  Patient had good clinical improvement including improvement in mental status, blood pressure, heart rate and appetite.  Antibiotics were narrowed to oral metronidazole and oral levofloxacin on 12/1 per ID recommendations with plan to complete 2-week course of antibiotics.    Sacral decubitus ulcer: Developed while patient at home, reflective of her recent decline in functional status. WOCN consulted and topical wound care therapies provided.  - WOCN Recommendations: L and R turning only, wedges, optimize nutrition, antimicrobial dressings for prevention, pending goals of care talk, consider enzymatic debridement- if drainage worsens or there are concerns for infection to bone, please consider scan (again, pending goals of care discussion)  - Cleanse wound with dakins. Pat dry, skin prep periwound. Apply dakins moistened 4x4 and dry cover dressing.    Left Ankle Wound  Left lateral ankle eschar  1. Cleanse wound with Normal Saline and 4 x 4 gauze, pat dry.   2. Apply non-alcohol skin barrier wipe (161096) to periwound and let dry 15 seconds.   3. Apply wound gel (045409) to gauze and apply to wound OR apply wound gel to wound bed.  4. Cover with gauze/ABD pad/appropriate cover dressing.   5. Secure dressing appropriately.   6. Change daily/PRN if dislodged, soiled or saturated.    Urinary retention: Patient had urinary retention on admission that required Foley catheter, and later failed a trial of void during admission.  She likely has some element of stretch injury to her bladder.  She should have a trial of void as an outpatient, and will be discharged with a Foley catheter in place.    Debility  Patient with significant decrease in function over past several months per  history. Family reports she had been up and able to get around fairly independently until recently including going shopping with assistance, but concern patient function actually lower than previously stated by family. Notably patient has large unstageable sacral ulcer that is likely at least stage 3 if not 4 which would not expect if she is functional enough to get around on her own. Seen by PT/OT here who noted patient requires max assistance for all tasks. Now essentially bedbound with worsening debility after this hospitalization. PT/OT recommendation for SNF. However, per patient and family patient absolutely refuses SNF/ALF as a possibility and most values being home with her grandson. Will plan to d/c home with DME including ramp and hoyer lift as well as Home Health.    Goals of care: Goals of care were addressed multiple times throughout admission.  Through discussions with the patient, her son, and her daughters, it was clear that patient would prefer to stay home if possible.  She is strongly opposed to any long-term health care facility or SNF stays.  Given these goals, the care team suggested the patient should be DNR/DNI; however, family were not ready to make this change.  We did explain her overall prognosis and are concerned that she will continue to have recurrent infections and overall decline.  The patient and her family understood her prognosis and respect the patient's wishes; she will go home with HH vs to a SNF.  Would continue to readdress as an outpatient. She follows with Dr. Deland [name] of palliative care.      **GERIATRIC ASSESSMENT:??  During her admission 11/3-11/5 she was very confused and weak, but she has a lot of family support and was able to be discharged home. Her family reports that she did improve after going home and was able to go out to eat with them. Then a few days before this presentation she had decreased oral intake and worsening fatigue/lethargy.    See note from recent hospitalization:  [name] [name] is a pleasant 84 year-old female with stage IV NSCLC with brain and liver mets as well as malignant pleural effusions s/p pleurx catheters. She was diagnosed with lung cancer this year. Prior to that, her family reports that she was very independent. She has a home in the [employer] area. She was doing her own shopping and preparing large meals for family. She has had 6 children, 4 living, and has a lot of support from family that has allowed her to continue living at home since her cancer diagnosis. Her daughter came down from [place] in June to live with her to help as her function declined. Her son and other family members live very close to her as well. Her son, [name] [name], is her HCPOA and he manages her finances and organizes her medications. Her daughter reports that she has had more weakness over the last month and is needing more help with getting to the toilet, bathing, and transfers. She uses a walker but every once in a while will walk without any assistive devices. Her family gives her a bed bath most days and twice a week will get her into the shower but [name] [name] sometimes gets angry about the shower.   ??    Functional Assessment   ??  ADLs:?? IADLs:   Feeding:??Independent  Dressing:??Requires Assistance  Ambulation:??Requires Assistance  Toileting:??Requires Assistance  Bathing:??Requires Assistance  ?? Using the phone:??Requires Assistance  Shopping:??Dependent  Meal preparation:??Dependent  Medication mgmt:??Dependent  Managing finances:??Dependent  Housework:??Dependent  Transportation (driving  or navigating public transit):??Dependent   ??  Living situation: Patient lives in??own home??with grandsons. Her daughter is staying wit her.   ??  Changes in ADLs during hospitalization:??Full dependent in all ADLs except feeding (requires assistance) at time of discharge  ??  Assistive devices:??walker  ??  Additional services recommended at discharge:??Home health PT, OT, and wound care  ??  Cognitive Assessment   ??  Delirium Assessment: CAM (Confusion Assessment Method):    On discharge, the patient??did show evidence of delirium though improved overall. Discharging mental status: Improved. ??   ??  Other cognitive assessment:????In March of this year there was documentation of some mild cognitive disorder but do not see a SLUMS or MOCA documented.  ??  Advance Care Planning   ??  See ACP Note by Dr. Doreatha Martin 01/16/18: Inquired regarding whether patient has any care preferences in the event that she becomes seriously ill.  Patient son states that she has completed a living well though it is currently at home.  He plans to bring to her next visit.  Son's understanding is that patient wants to ride this out in terms of receiving all treatments until there is nothing left.  This would include being on a respirator.  Discussed low likelihood that care in intensive care unit would enable patient to return to her current level of function and that she would most likely not survive.  She states wanting to wait and see. Son states that patient is living for her family and particularly her great grandson who is 31 years old and for whom she began providing care when he was 10 weeks old secondary to a mother who was living with addiction.  ??  Code Status:??Full Code  ??  Surrogate decision maker:??Her son, [name] [name]  ??  Emergency Contact:  Extended Emergency Contact Information  Primary Emergency Contact: Spisak,Jessie  ??[place]  Home Phone: [phone]  Mobile Phone: [phone]  Relation: Son  Secondary Emergency Contact: Viviann Spare  Mobile Phone: [phone]  Relation: Daughter

## 2018-01-21 NOTE — Unmapped (Signed)
Geriatrics (MDA) History and Physical    Assessment & Plan:     [name] [name] is a 84 y.o. female with PMHx of metastatic NSCLC (Stage IV, on crizotinib) c/b b/l malignant effusions s/p bilateraly pleurX catheters, recent hospitalization earlier this month for cellulitis, hx multiple LE ulcers, AFib on elequis, that presented to [facility] with fever and weakness.       Sepsis: Hypotensive, febrile w/ lactate 3.1 on admission. S/p 30cc/kg IVF in ED and vanc/cefepime w/ improvement. Repeat lactate improved to 1.7 after IVF and abx. Recent admission for LLE cellulitis treated w/ keflex. Dirty UA on admission and recent issues w/ retention, most likely urinary source. However, given degree illness on presentation, some degree of immunosuppression 2/2 chemo, and recent hospitalizations, continue broad spectrum x24hrs and can likely narrow pending cx data  - hold home lasix 20 daily  - continue vanc/cefepime (11/23 - )  - f/u BCx, UCx  - f/u CT A/P (addendum: read w/out hydronephrosis or nephrolithiasis. Overall demonstration of multiple lesions suspicious for metastatic disease - renal, peri-duodenal. Large volume diffuse ascites)    Anemia of chronic disease: requiring transfusion of 2U last admission. Apixaban held that admission but resumed on discharge. Hgb stable this admission.  - continue PO iron supplementation  Addendum:morning labs w/ Hgb 8.2, lower than prior values. In setting of hypotension, unclear cause of downtrend, hold eliquis until 2pm H/H results    Wounds: multiple LE wounds, sacral decubitus ulcer. Appears have been present for some time. No e/o active infection  - WOCN c/s    Elevated INR: INR notably 2.3 this admission, has been steady rise over course of past year. Suspect d/t liver mets.  - daily coags    Recent Urinary Retention: discharge home w/ foley, completed TOV and reportedly no longer requiring I&O cath's, concerned that she may be retaining again and this is contributing to UTI.   - bladder scan qshift, I&O for PVR >300  - strict intake/output    AFib: holding eliquis as above    Hx PE: March 2019, on eliquis, hold as above given decreased H/H on AM labs    Stage IV NSCLC: c/b b/l malignant effusions s/p b/l pleurX catheters. Mets to liver. Recent heme/onc f/u w/ stable disease. Follows w/ Dr. Legrand Como in palliative care, see most recent note on 11/19 for ACP details.  - monitor pleurx catheter drainage  - reach out to oncology in AM to discus whether to hold PO chemo while tx'ing infeciton    IDDM: A1c 6.4% this month. Home insulin recently reduced to Novolog 70/30 8U BID  - hold home metformin  - reduce to novolog 70/30 5U BID pending PO intake  - SSI    Pericardial Effusion: first noted March 2019. Stable on TTE during recent admission. No concerning EKG changes, currently asymptomatic. CTM    Daily Checklist:  Diet: Regular Diet  DVT PPx: Patient Already on Full Anticoagulation with Eliquis   GI PPx: Home H2 Blocker  Code Status: Full Code   - See Dr. Selinda Eon 11/19 ACP note and 11/5 Discharge Summary (listed as progress note) for details regarding functional status and GOC. Clarified w/ patient's son on admission that she would want ICU level care including pressors if warranted  Dispo: Admit to Med A, stepdown status    Chief Concern:   No Principal Problem: There is no principal problem currently on the Problem List. Please update the Problem List and refresh.    Subjective:  HPI:  [name] [name] is a 84 y.o. female with PMHx as reviewed in the EMR that presented to [facility] with No Principal Problem: There is no principal problem currently on the Problem List. Please update the Problem List and refresh..    [name] [name] was recently admitted 11/3-11/5 w/ cellulitis. Since then reportedly was doing well until the past two days when she started to develop decreased appetite. Her palliative care physician, Dr. Legrand Como, prescribed remeron to see if this might help. Since urology follow up she has been urinating well on her own, no longer requiring I&O. Yesterday she started having decreased appetite and didn't want to get out of bed which was abnormal for her. Was having BG's in 50-60's, requiring family to use sugar tabs/soda/etc to keep BG nl. Was incontinent of urine for her, which is unusual. Last BM was either Wednesday or Thursday (large volume per family). No N/V/cough. No sick contacts. She did manage to eat this morning. Today she had a fever of 101.6, for which family brought her to the ED.    In the ED, patient was initially febrile to 38.3, BP 70s systolic (though quite labile), and HR 116. Incontinent on arrival, required I&O w/ ~750cc out, per nursing was purulent at the end. Lactate was 3.1. VBG 7.43/47/28. CBC w/ WBC 10 (ANC 1.4), Hgb stable. CMP largely unremarkable (Cr 1.1 from baseline ~0.9). Mg 1.4. Lipase nl. Troponin negative. UA w/ Large LE, >100 WBC. EKG w/ AFib, known RBBB. BCx drawn. CT head negative for acute change (known calvarial met). CXR w/ improving L pleural effusion as compared to prior, no acute changes. She received 30cc/kg LR (1.7L), vancomycin and cefepime. CT abd pending at time of admission.    Allergies:  Sulfasalazine    Medications:   Prior to Admission medications    Medication Dose, Route, Frequency   blood sugar diagnostic (GLUCOSE BLOOD) Strp Check blood sugar 4 times daily.   blood sugar diagnostic Strp Check 4x/day as instructed   blood-glucose meter kit Use as instructed   blood-glucose meter Misc 1 each, Miscellaneous, 4 times a day   calcium carbonate-vitamin D3 (CALTRATE 600 + D) 600 mg (1,500 mg)-800 unit Chew 1 tablet, Oral, 2 times a day (standard)   crizotinib (XALKORI) 250 mg capsule 250 mg, Oral, 2 times a day (standard)   ELIQUIS 5 mg Tab 5 mg, Oral, 2 times a day (standard), TAKE 1 TABLET (5 MG TOTAL) BY MOUTH TWO (2) TIMES A DAY.   ferrous sulfate 325 (65 FE) MG EC tablet 325 mg, Oral, Daily   furosemide (LASIX) 20 MG tablet 20 mg, Oral, Daily PRN   insulin aspart protamine-insulin aspart (NOVOLOG MIX 70-30FLEXPEN U-100) 100 unit/mL (70-30) injection pen 8 Units, Subcutaneous, 2 times a day   lancets (ACCU-CHEK SOFTCLIX LANCETS) Misc Use 4 times daily.   lancets Misc 1 each, Miscellaneous, 4 times a day   lidocaine 4 % patch 1 patch, Transdermal, Daily (standard)   metFORMIN (GLUCOPHAGE-XR) 500 MG 24 hr tablet 1,000 mg, Oral, 2 times a day   mirtazapine (REMERON) 7.5 MG tablet 7.5 mg, Oral, Nightly   pen needle, diabetic (BD ULTRA-FINE SHORT PEN NEEDLE) 31 gauge x 5/16 Ndle USE AS DIRECTED. USE 4 NEEDLES A DAY WITH DIABETIC MEDICATION PENS AS DIRECTED   potassium chloride SA (K-DUR,KLOR-CON) 20 MEQ tablet 10 mEq, Oral, 2 times a day (standard), Take 1/2 tablet (10 mEq) BID    ranitidine (ZANTAC) 150 MG tablet 150 mg, Oral, Daily  simethicone (GAS-X) 80 MG chewable tablet 80 mg, Oral, 4 times daily PRN       Medical History:  Past Medical History:   Diagnosis Date   ??? Atrial fibrillation (CMS-HCC)    ??? Cancer (CMS-HCC)    ??? Diabetes mellitus (CMS-HCC)    ??? Hypertension    ??? Metastatic lung cancer (metastasis from lung to other site) (CMS-HCC)    ??? Pulmonary embolism (CMS-HCC)        Surgical History:  Past Surgical History:   Procedure Laterality Date   ??? CHG RAD GUIDED,PERCUT DRAINAGE,W/CATH PLACE Right 06/19/2017    Procedure: RADIOLOGICAL GUIDANCE, FOR PERCUTANEOUS DRAINAGE, WITH PLACEMENT OF CATHETER, RAD S&I;  Surgeon: Sherwood Gambler, MD;  Location: BRONCH PROCEDURE LAB [facility];  Service: Pulmonary   ??? CHOLECYSTECTOMY     ??? HYSTERECTOMY     ??? neck surgey     ??? PR INSERTION INDWELLING TUNNELED PLEURAL CATHETER Bilateral 06/19/2017    Procedure: INSERTION OF INDWELLING TUNNELED PLEURAL CATHETER WITH CUFF WITH MODERATE SEDATION;  Surgeon: Sherwood Gambler, MD;  Location: BRONCH PROCEDURE LAB [facility];  Service: Pulmonary   ??? PR INSERTION INDWELLING TUNNELED PLEURAL CATHETER Left 10/26/2017    Procedure: INSERTION OF INDWELLING TUNNELED PLEURAL CATHETER WITH CUFF;  Surgeon: Mercy Moore, MD;  Location: MAIN OR [facility];  Service: Pulmonary   ??? PR MOD SED SAME PHYS/QHP EACH ADDL 15 MINS  06/19/2017    Procedure: MODERATE SEDATION SERVICES PROVIDED BY SAME PHYSICIAN OR OTHER QUALIFIED HEALTH CARE PROFESSIONAL PERFORMING THE DIAGNOSTIC OR THERAPEUTIC SERVICE THAT SEDATION SUPPORTS, EACH ADDITIONAL 15 MINS;  Surgeon: Sherwood Gambler, MD;  Location: BRONCH PROCEDURE LAB [facility];  Service: Pulmonary   ??? PR MOD SED SAME PHYS/QHP INITIAL 15 MINS 5/> YRS  06/19/2017    Procedure: MODERATE SEDATION SERVICES PROVIDED BY SAME PHYSICIAN OR OTHER QUALIFIED HC PROFESSIONAL PERFORMING THE DIAGNOSTIC OR THERAPEUTIC SERVICE THAT SEDATION SUPPORTS, INIT 15 MINS, PT AGE 39 YEARS OR OLDER;  Surgeon: Sherwood Gambler, MD;  Location: BRONCH PROCEDURE LAB [facility];  Service: Pulmonary       Social History:  Social History     Socioeconomic History   ??? Marital status: Widowed     Spouse name: Not on file   ??? Number of children: Not on file   ??? Years of education: Not on file   ??? Highest education level: Not on file   Occupational History   ??? Not on file   Social Needs   ??? Financial resource strain: Not on file   ??? Food insecurity:     Worry: Not on file     Inability: Not on file   ??? Transportation needs:     Medical: Not on file     Non-medical: Not on file   Tobacco Use   ??? Smoking status: Never Smoker   ??? Smokeless tobacco: Never Used   Substance and Sexual Activity   ??? Alcohol use: No   ??? Drug use: Never   ??? Sexual activity: Not on file   Lifestyle   ??? Physical activity:     Days per week: Not on file     Minutes per session: Not on file   ??? Stress: Not on file   Relationships   ??? Social connections:     Talks on phone: Not on file     Gets together: Not on file     Attends religious service: Not on file     Active member of club  or organization: Not on file     Attends meetings of clubs or organizations: Not on file     Relationship status: Not on file Other Topics Concern   ??? Do you use sunscreen? No   ??? Tanning bed use? No   ??? Are you easily burned? No   ??? Excessive sun exposure? No   ??? Blistering sunburns? No   Social History Narrative   ??? Not on file       Family History:  Family History   Problem Relation Age of Onset   ??? Cancer Brother         lung   ??? Cancer Daughter         lung   ??? Cancer Daughter         breast       Review of Systems:  All systems reviewed and negative except per HPI.    Objective:   Physical Exam:  Temp:  [38.3 ??C] 38.3 ??C  Heart Rate:  [112-126] 112  Resp:  [15-24] 15  BP: (77-195)/(51-138) 122/81  SpO2:  [96 %] 96 %    Gen: Chronically ill woman, cachectic in NAD, alert, oriented, answers questions appropriately  HEENT: atraumatic, normocephalic, sclera anicteric, EOMI, PERRLA, dry mucous membranes. Temporal wasting bilaterally  Neck: no cervical lymphadenopathy or thyromegaly, no JVD  Heart: irregularly irregular, tachycardic, S1, S2, no M/R/G, no chest wall tenderness  Lungs: CTAB, decreased breathsounds @ L base.   Abdomen: Normoactive bowel sounds, soft, NTND, no rebound/guarding, no hepatosplenomegaly  Extremities: no clubbing, cyanosis, 1+ edema on R, 2+ on left. Mild erythema of ankle on L though not appearing acutely infected. Cool extremities but 2+ pulses.  Neuro: CN II-XI grossly intact, No focal deficits.  Skin:  Multiple wounds w/ bandages c/d/i. Scaling of skin on LLE.   Psych: Normal mood and affect. Interactive, answers questions appropriately.    Labs/Studies:  Labs and Studies from the last 24hrs per EMR and Reviewed    Imaging: Pending    EKG: AFib, unchanged from prior

## 2018-01-21 NOTE — Unmapped (Signed)
Poor appetite, thus far, pt not drinking Clear Ensure; Unable to Wean off of Norepi but will continue to monitor and Assess; placed a Wound and Nutrition Consult for PI which appears to be Either a Stage 3 or DTI. Family endorses that the PI has been there for a least two months.    rounded with MD and added clear supplements; Assessed PI, and developed a new POC for the hypothension.      Problem: Adult Inpatient Plan of Care  Goal: Plan of Care Review  Outcome: Progressing  Goal: Patient-Specific Goal (Individualization)  Outcome: Progressing  Goal: Absence of Hospital-Acquired Illness or Injury  Outcome: Progressing  Goal: Optimal Comfort and Wellbeing  Outcome: Progressing  Goal: Readiness for Transition of Care  Outcome: Progressing  Goal: Rounds/Family Conference  Outcome: Progressing     Problem: Skin Injury Risk Increased  Goal: Skin Health and Integrity  Outcome: Progressing     Problem: Self-Care Deficit  Goal: Improved Ability to Complete Activities of Daily Living  Outcome: Progressing     Problem: Fall Injury Risk  Goal: Absence of Fall and Fall-Related Injury  Outcome: Progressing     Problem: Diabetes Comorbidity  Goal: Blood Glucose Level Within Desired Range  Outcome: Progressing     Problem: Hypertension Comorbidity  Goal: Blood Pressure in Desired Range  Outcome: Progressing

## 2018-01-21 NOTE — Unmapped (Signed)
Patient transported to X-ray and CT Scan  Transported by Radiology  How tranported Stretcher  Cardiac Monitor yes

## 2018-01-21 NOTE — Unmapped (Signed)
Care Management  Initial Transition Planning Assessment    Sepsis   HPI:  [name] [name] is a 84 y.o. female with PMHx as reviewed in the EMR that presented to [facility] with No Principal Problem: There is no principal problem currently on the Problem List. Please update the Problem List and refresh..  ??  Ms. Kreamer was recently admitted 11/3-11/5 w/ cellulitis. Since then reportedly was doing well until the past two days when she started to develop decreased appetite. Her palliative care physician, Dr. Legrand Como, prescribed remeron to see if this might help. Since urology follow up she has been urinating well on her own, no longer requiring I&O.           General  Care Manager assessed the patient by : Medical record review, Telephone conversation with family  Orientation Level: Disoriented to place, Disoriented to situation, Disoriented to time  Who provides care at home?: N/A    Contact/Decision Maker        Extended Emergency Contact Information  Primary Emergency Contact: Zeek,Jessie   [place]  Home Phone: [phone]  Mobile Phone: [phone]  Relation: Son  Secondary Emergency Contact: Viviann Spare  Mobile Phone: [phone]  Relation: Daughter    Legal Next of Kin / Guardian / POA / Advance Directives     HCDM (HCPOA): Satchell,Jessie - Son [phone]    Advance Directive (Medical Treatment)  Does patient have an advance directive covering medical treatment?: Patient has advance directive covering medical treatment, copy in chart.  Reason patient does not have an advance directive covering medical treatment:: Patient does not wish to complete one at this time  Information provided on advance directive:: No  Patient requests assistance:: No    Advance Directive (Mental Health Treatment)  Does patient have an advance directive covering mental health treatment?: Patient would not like information., Patient does not have advance directive covering mental health treatment.  Reason patient does not have an advance directive covering mental health treatment:: Patient does not wish to complete one at this time.    Patient Information  Lives with: Children    Type of Residence: Private residence        Location/Detail: [address] [place] [address] this is a single story hoe iwth 3 STE front and 4 STE back has HR    Support Systems: Family Members, Children    Responsibilities/Dependents at home?: Yes (Describe)(12&14 yr old grandsons)    Home Care services in place prior to admission?: Yes  Type of Home Care services in place prior to admission: Home health (specify)  Current Home Care provider (Name/Phone #): [facility] open to PT/RN    Outpatient/Community Resources in place prior to admission: Clinic       Equipment Currently Used at Home: cane, straight, walker, rolling, glucometer       Currently receiving outpatient dialysis?: No       Financial Information       Need for financial assistance?: No       Social Determinants of Health  Social Determinants of Health were addressed in provider documentation.  Please refer to patient history.    Discharge Needs Assessment  Concerns to be Addressed: care coordination/care conferences, adjustment to diagnosis/illness, coping/stress    Clinical Risk Factors: > 65, Principal Diagnosis: Cancer, Stroke, COPD, Heart Failure, AMI, Pneumonia, Joint Replacment, Functional Limitations    Barriers to taking medications: No    Prior overnight hospital stay or ED visit in last 90  days: Yes    Readmission Within the Last 30 Days: unable to assess         Anticipated Changes Related to Illness: inability to care for someone else, inability to care for self    Equipment Needed After Discharge: (TBD)    Discharge Facility/Level of Care Needs:      Readmission  Risk of Unplanned Readmission Score: UNPLANNED READMISSION SCORE: 37%  Readmitted Within the Last 30 Days?   Patient at risk for readmission?: Yes    Discharge Plan  Screen findings are: Discharge planning needs identified or anticipated (Comment).(CM to monitor for discharge planning)    Expected Discharge Date: (TBD)    Expected Transfer from Critical Care: (TBD)    Patient and/or family were provided with choice of facilities / services that are available and appropriate to meet post hospital care needs?: Yes   List choices in order highest to lowest preferred, if applicable. : [facility] with [facility]    Initial Assessment complete?: Yes  Adline Potter  January 21, 2018 1:18 PM

## 2018-01-21 NOTE — Unmapped (Signed)
MD at bedside. 

## 2018-01-21 NOTE — Unmapped (Signed)
Patient transported to CT Scan  Transported by Radiology  How tranported Stretcher  Cardiac Monitor yes

## 2018-01-21 NOTE — Unmapped (Signed)
Patient returned from X-ray and CT Scan  Transported by Radiology  How tranported Stretcher  Cardiac Monitor no

## 2018-01-22 DIAGNOSIS — A419 Sepsis, unspecified organism: Principal | ICD-10-CM

## 2018-01-22 LAB — CBC
HEMATOCRIT: 23.8 % — ABNORMAL LOW (ref 36.0–46.0)
HEMOGLOBIN: 8.2 g/dL — ABNORMAL LOW (ref 13.5–16.0)
MEAN CORPUSCULAR HEMOGLOBIN CONC: 34.3 g/dL (ref 31.0–37.0)
MEAN CORPUSCULAR HEMOGLOBIN: 25.7 pg — ABNORMAL LOW (ref 26.0–34.0)
MEAN CORPUSCULAR VOLUME: 74.8 fL — ABNORMAL LOW (ref 80.0–100.0)
MEAN PLATELET VOLUME: 6.8 fL — ABNORMAL LOW (ref 7.0–10.0)
NUCLEATED RED BLOOD CELLS: 0 /100{WBCs} (ref <=4)
PLATELET COUNT: 330 10*9/L (ref 150–440)
RED BLOOD CELL COUNT: 3.18 10*12/L — ABNORMAL LOW (ref 4.00–5.20)
RED CELL DISTRIBUTION WIDTH: 23.4 % — ABNORMAL HIGH (ref 12.0–15.0)
WBC ADJUSTED: 5.6 10*9/L (ref 4.5–11.0)

## 2018-01-22 LAB — BASIC METABOLIC PANEL
ANION GAP: <1 mmol/L — ABNORMAL LOW (ref 7–15)
BLOOD UREA NITROGEN: 22 mg/dL — ABNORMAL HIGH (ref 7–21)
BUN / CREAT RATIO: 27
CALCIUM: 8 mg/dL — ABNORMAL LOW (ref 8.5–10.2)
CHLORIDE: 107 mmol/L (ref 98–107)
CO2: 28 mmol/L (ref 22.0–30.0)
CREATININE: 0.81 mg/dL (ref 0.60–1.00)
EGFR CKD-EPI AA FEMALE: 77 mL/min/{1.73_m2} (ref >=60)
EGFR CKD-EPI NON-AA FEMALE: 66 mL/min/{1.73_m2} (ref >=60)
POTASSIUM: 3.2 mmol/L — ABNORMAL LOW (ref 3.5–5.0)
SODIUM: 135 mmol/L (ref 135–145)

## 2018-01-22 LAB — PROTIME: Lab: 24.9 — ABNORMAL HIGH

## 2018-01-22 LAB — PROTIME-INR: INR: 2.13

## 2018-01-22 LAB — CHLORIDE: Chloride:SCnc:Pt:Ser/Plas:Qn:: 107

## 2018-01-22 LAB — CORTISOL TOTAL: Cortisol:MCnc:Pt:Ser/Plas:Qn:: 22.2

## 2018-01-22 LAB — MEAN CORPUSCULAR VOLUME: Lab: 74.8 — ABNORMAL LOW

## 2018-01-22 NOTE — Unmapped (Signed)
Received fax from [facility]. Orders for PT. Placed in box for review and signature.

## 2018-01-22 NOTE — Unmapped (Signed)
Geriatrics (MDA) Progress Note    Assessment & Plan:     [name] [name] is a 84 y.o. female with a PMHx of metastatic NSCLC (Stage IV, on crizotinib) c/b b/l malignant effusions s/p bilateraly pleurX catheters, recent hospitalization earlier this month for cellulitis, hx multiple LE ulcers, AFib on elequis, that presented to [facility] with fever and weakness.    Principal Problem:    Sepsis (CMS-HCC)  Active Problems:    Diabetes mellitus, type 2 (CMS-HCC)    Metastatic lung cancer (metastasis from lung to other site), unspecified laterality (CMS-HCC)    Pressure injury of sacral region, stage 2 (CMS-HCC)    Urinary tract infection  Resolved Problems:    * No resolved hospital problems. *      Urosepsis  Hypotensive and febrile with lactate 3.1 on admission. S/p fluid resuscitation with improvement in lactate. Started on Levophed through PIV along with Midodrine 10mg  TID. Unfortunately have been unsuccessful weaning pressors to date, and still requires 2-4 of Norepinephrine for pressure support. A-line placed this afternoon. Have attempted twice now to place CVL, but patient uncooperative thus far making risk greater than benefit. Will continue to attempt to get patient to agree to CVL, but may need to use sedative to assist. 2/2 blood cultures positive for gram negative bacilli, and urine culture positive for Pseudomonas now. D/c'd Vanc and will continue Cefepime. Consider adding Tobramycin if patient worsens or continues to not improve. In continued discussions regarding goals of care with patient and family continue to pursue aggressive medical route regarding current infection. Will continue to address goals of care as necessary.  - Follow up culture sensitivities  - Cefepime 2g q12h  - Consider adding Tobra if decompensates or does not improve    Delirium  Patient now waxing and waning with inattention, confusion, decreased mental status, and occasionally somewhat verbally and physically aggressive behavior. Most likely related to urosepsis. Will continue to monitor and follow delirium precautions.    Urinary Retention  Had been discharged home from last hospitalization with foley in place. Successfully completed trial of void and had not been requiring I&O caths. However, concern for retention here so foley placed and continues for now.  - Strict I/Os    Multiple Wounds  Multiple wounds to BLE and significant sacral decubitus ulcer which appear to have been present for some time. No evidence of acute infection. Sacral wound suggests patient recently has not been as functional as initially suspected.  - WOCN consult    Stage IV NSCLC  Complicated by bilateral malignant infusions with PleurX catheters in place and drained qOD. Known mets to liver. Recently saw Heme/Onc and noted to have stable disease. On Crizotinib alone for cancer directed therapy. Holding Crizotinib while septic which Oncology sts is reasonable in curbside consult. Will need to figure out how Crizotinib should be reordered once patient clears sepsis. Does follow with Dr. Legrand Como for Palliative Care. Has had ongoing discussions regarding goals of care; see most recent note 01/16/2018 for further details. Will continue to address goals of care while admitted. Concern in particular that should patient code she would be unlikely to have a good outcome.  - Hold Crizotinib  - Drain PleurX catheters every other day (due today 11/25)    Anemia of Chronic Disease  Recent admission in early 12/2017 during which patient required 2 units pRBCs for anemia. Hemoglobin stable at 9.8 on admission and stable at 8.2 since receiving fluid resuscitation. Had been on iron supplementation, but will  d/c as patient may not have been taking and concern this will further decrease appetite in patient with already limited PO intake.  - Hold iron supplement    IDDM  A1c 6.4 most recently. Home insulin Novolog 70/30 8 units BID per recent decrease. Had been borderline hypoglycemic at home. Will decrease to 5 units Novolog 70/30 BID for now while admitted.  - Novolog 70/30 5 units BID with SSI    Daily Checklist:  Diet: Regular Diet  DVT PPx: Patient Already on Full Anticoagulation with Apixaban   GI PPx: Home H2 Blocker  Electrolytes: Potassium Repleted  Code Status: Full Code, though will plan to continue discussions regarding Code Status and GOC as suspect patient would have poor outcome if she required CPR  Dispo: Continue ICU Care    Subjective:   Continued to require Norepinephrine overnight mostly at 2 and up to max of 4. Also receiving Midodrine 10mg  q8h. Patient denies any complaints this morning. Seems a little less awake and interactive today compared to interview yesterday morning. Per family patient was a bit grumpy and confused overnight. Was apparently somewhat verbally abusive to nursing including trying to hit an NA. Family reports wanting to continue with aggressive medical care for sepsis.    Objective:   Heart Rate:  [54-96] 77  SpO2 Pulse:  [51-77] 62  Resp:  [10-21] 16  BP: (65-175)/(19-142) 144/62  SpO2:  [89 %-100 %] 100 %    Gen: Chronically ill-appearing in NAD, alert, oriented to person only, answers most questions appropriately  HEENT: atraumatic, sclera anicteric, MMM. OP w/o erythema or exudate, temporal wasting  Heart: Irregularly irregular, normal rate, S1, S2, no M/R/G, no chest wall tenderness  Lungs: Diminished breath sounds worst in right mid-lung and base, no use of accessory muscles  Abdomen: Normoactive bowel sounds, soft, NTND, no rebound/guarding  Extremities: No clubbing or cyanosis, BLE pitting edema L > R, multiple wounds with bandages intact    Labs/Studies: Labs and Studies from the last 24hrs per EMR and Reviewed

## 2018-01-22 NOTE — Unmapped (Addendum)
Adult Nutrition Assessment Note    Visit Type: RN Consult  Reason for Visit: Have you had a decrease in food intake or appetite?, Have you gained or lost 10 pounds in the past 3 months?    ASSESSMENT:   HPI & PMH: Per EMR, [name] [name] is a 84 y.o. female with PMHx of metastatic NSCLC (Stage IV, on crizotinib) c/b b/l malignant effusions s/p bilateraly pleurX catheters, recent hospitalization earlier this month for cellulitis, hx multiple LE ulcers, AFib on elequis, that presented to [facility] with fever and weakness.  Nutrition Hx: Most of NutHx provided by family at bedside, they mention UBW of 137 lbs, but that the pt has lost weight recently. Per EMR, pt with 6% weight loss between 11/11 and 11/19 which is significant, more recent weight gain likely 2/2 fluid as pt is +5.4 L. Per family, appetite was veracious, having 100% of 3 meals/day and sometimes more, however 3 days PTA pt appetite declined, now eating <50% of meals. Pt does not like any milky Ensure products but does like Ensure clear. Family denies pt N/V, does endorse some diarrhea PTA that has resolved. Pt denies chewing/swallowing difficulties.  Nutritionally Pertinent Meds: Senna, potassium chloride, abx, calcium carbonate-cholecalciferol, pepcid, miralax, insulin, norepinephrine 3.16mL/hr, LR infusion at 10mL/hr, PRN: D50W  Labs: K 3.2, BUN 22, glucose POC 128-211, Ca 8.0  Abd/GI: Last BM 11/20  Skin: unstageable pressure injury, wound/skin tear  Patient Lines/Drains/Airways Status    Active Wounds     Name:   Placement date:   Placement time:   Site:   Days:    Wound 01/20/18 Pressure Injury Sacrum Mid Picture taken 11/24 by MD and uploaded to Epic Unstageable   01/20/18    0000    Sacrum   2    Wound 12/14/17 Skin Tear Knee Left   12/14/17    ???    Knee   39               Current nutrition therapy order:   Nutrition Orders          Supplement Adult; Ensure Clear (Clear Liquid); # of Products PER Serving: 1 4xd PCHS starting at 11/24 1300 Nutrition Therapy General (Regular) starting at 11/24 0208           Anthropometric Data:  -- Height: 162.6 cm (5' 4.02)   -- Last recorded weight: 63.9 kg (140 lb 14 oz)  -- Admission weight: 63.9 kg (140 lb 14 oz)  -- IBW: 54.53 kg  -- Percent IBW: 117.18 %  -- BMI: Body mass index is 24.17 kg/m??.   -- Weight changes this admission:   Last 5 Recorded Weights    01/21/18 0426 01/21/18 1930   Weight: 63.9 kg (140 lb 14 oz) 63.9 kg (140 lb 14 oz)      -- Weight history PTA:   Wt Readings from Last 10 Encounters:   01/21/18 63.9 kg (140 lb 14 oz)   01/16/18 58.8 kg (129 lb 9.6 oz)   01/08/18 62.1 kg (137 lb)   01/05/18 61.2 kg (134 lb 14.7 oz)   12/20/17 61.2 kg (135 lb)   12/11/17 59.9 kg (132 lb)   11/30/17 60.8 kg (134 lb)   11/28/17 60.8 kg (134 lb 1.3 oz)   11/21/17 61.9 kg (136 lb 6.4 oz)   11/01/17 60.8 kg (134 lb 0.6 oz)        Daily Estimated Nutrient Needs:   Energy: 1575-1890 kcals [25-30 kcal/kg using last  recorded weight, 63 kg (01/22/18 1524)]  Protein: 63-76 gm [1.0-1.2 gm/kg using last recorded weight, 63 kg (01/22/18 1524)]  Carbohydrate:   [no restriction]  Fluid:   [per MD team]     Nutrition Focused Physical Exam:  Fat Areas Examined  Orbital: Moderate loss      Muscle Areas Examined  Temple: Severe loss  Clavicle: Severe loss     NFPE brief, pt originally agreed to assessment, however, when assessing orbital responded get your hand off my face. RD deferred NFPE at this time. However, moderate/severe losses noted. Needs full NFPE.         DIAGNOSIS:  Malnutrition Assessment using AND/ASPEN Clinical Characteristics:        Malnutrition dx pending        Overall nutrition impression:   Pt was eating well 100% of 3-4 meals/day, however 3 days PTA began having decreased appetite, eating <50% currently. Brief NFPE as pt seemed irritable, inappropriate to continue exam - Needs full NFPE, malnutrition dx. Pt with prior weight loss, then regain likely 2/2 fluid (+5.4L per I/O). Pt likes Ensure Clear and does not like Milk products, no milkshakes. Monitor intake, weight.        GOALS:  Oral Intake:       - Patient to consume >/=50% of 3 meals per day.  - Patient to consume >/=50% of 1-2 oral supplements per day.  Anthropometric:       - Maintain weight within 2-3% of 63.9 kg over course of hospitalization.  Laboratory Data:       - Electrolyte and renal profile will trend towards normal limits.     RECOMMENDATIONS AND INTERVENTIONS:  1. Recommend continue regular diet order, continue Ensure Clear  2. RD encouraged pt to eat as much as she can, high kcal/high protein diet  3. Consider 500mg  Vitamin C and 220mg  Zinc daily for wound healing (d/c zinc after 10 days, interferes with copper absorption)  4. Recommend MVI  5. Consider increasing bowel regimen, last BM 11/20, however on abx - monitor  6. Correct lytes, of note, K 3.2  7. Continue to monitor intake per % meal documentation in EMR  8. Please weigh patient weekly    RD Follow Up Parameters:  1-2 times per week (and more frequent as indicated)     I appreciate the opportunity to participate in the care of this patient.  Please contact me with any questions.     Abel Presto, MPH, RDN, LDN, CSCS  Pager: [phone]  Phone: [phone]

## 2018-01-22 NOTE — Unmapped (Signed)
Called to follow up with caregiver. Per caregiver patient was taken to hospital Saturday for evaluation and is currently admitted at this time.

## 2018-01-22 NOTE — Unmapped (Signed)
Antibiotic Timeout Checklist  Indication for antibiotics: sepsis  Antibiotic Start Date: 01/20/18  Current systemic antibiotics: vancomycin IV 1g q24hr and cefepime IV 2g q12hr   Microbiology Results: gram negative bacilli on [facility]  Sensitivities Available? n/a  Are antibiotics still indicated? YES  Is it appropriate to de-escalate? YES  Is it appropriate to convert to PO therapy? NO  Today's antibiotic plan: discontinue vancomycin, continue cefepime IV 2g q12hr  Planned Antibiotic Duration: TBD     Elvis Coil, PharmD Candidate    Okey Regal, PharmD  PGY1 Acute Care Pharmacy Resident

## 2018-01-22 NOTE — Unmapped (Signed)
Pt remains ICU status. Alert and oriented to self only. Drowsy but agitated with stimulus. At times physically and verbally abusive. Q2 turns. Urine output adequate. LR @ 75/hr. Norepi @ 2. Anticipate being able to wean from norepi before change of shift.  Problem: Adult Inpatient Plan of Care  Goal: Plan of Care Review  Outcome: Progressing  Goal: Patient-Specific Goal (Individualization)  Outcome: Progressing  Goal: Absence of Hospital-Acquired Illness or Injury  Outcome: Progressing  Goal: Optimal Comfort and Wellbeing  Outcome: Progressing  Goal: Readiness for Transition of Care  Outcome: Progressing  Goal: Rounds/Family Conference  Outcome: Progressing     Problem: Skin Injury Risk Increased  Goal: Skin Health and Integrity  Outcome: Progressing     Problem: Self-Care Deficit  Goal: Improved Ability to Complete Activities of Daily Living  Outcome: Progressing     Problem: Fall Injury Risk  Goal: Absence of Fall and Fall-Related Injury  Outcome: Progressing     Problem: Diabetes Comorbidity  Goal: Blood Glucose Level Within Desired Range  Outcome: Progressing     Problem: Hypertension Comorbidity  Goal: Blood Pressure in Desired Range  Outcome: Progressing     Problem: Infection  Goal: Infection Symptom Resolution  Outcome: Progressing

## 2018-01-22 NOTE — Unmapped (Signed)
ARTERIAL LINE (A-Line) PLACEMENT    Date: 01/22/18   Time: 1:07 PM  Indication: Hemodynamic monitoring  Attending: Tawnya Crook, MD  Supervising Resident: Redgie Grayer, MD PGY-3    A time-out was completed verifying correct patient, procedure, site, positioning, and special equipment if applicable. The patient???s left wrist was prepped and draped in sterile fashion. 1% Lidocaine was used to anesthetize the area. A 20 G Arrow line was introduced into the radial artery. The catheter was threaded over the guide wire and the needle was removed with appropriate pulsatile blood return. The catheter was then sutured in place to the skin and a sterile dressing applied. Perfusion to the extremity distal to the point of catheter insertion was checked and found to be adequate. A pressure transducer was connected sterilely to the arterial line and an arterial line waveform was noted on the monitor.     Estimated Blood Loss: 10 ml  The patient tolerated the procedure well and there were no complications.

## 2018-01-23 LAB — BASIC METABOLIC PANEL
ANION GAP: <1 mmol/L — ABNORMAL LOW (ref 7–15)
BLOOD UREA NITROGEN: 22 mg/dL — ABNORMAL HIGH (ref 7–21)
BUN / CREAT RATIO: 33
CALCIUM: 8.1 mg/dL — ABNORMAL LOW (ref 8.5–10.2)
CO2: 27 mmol/L (ref 22.0–30.0)
CREATININE: 0.67 mg/dL (ref 0.60–1.00)
EGFR CKD-EPI AA FEMALE: >90 mL/min/{1.73_m2} (ref >=60)
EGFR CKD-EPI NON-AA FEMALE: 80 mL/min/{1.73_m2} (ref >=60)
GLUCOSE RANDOM: 220 mg/dL — ABNORMAL HIGH (ref 65–179)
POTASSIUM: 3.7 mmol/L (ref 3.5–5.0)
SODIUM: 134 mmol/L — ABNORMAL LOW (ref 135–145)

## 2018-01-23 LAB — SODIUM: Sodium:SCnc:Pt:Ser/Plas:Qn:: 134 — ABNORMAL LOW

## 2018-01-23 LAB — CBC
HEMATOCRIT: 25.1 % — ABNORMAL LOW (ref 36.0–46.0)
HEMOGLOBIN: 8.7 g/dL — ABNORMAL LOW (ref 13.5–16.0)
MEAN CORPUSCULAR HEMOGLOBIN CONC: 34.5 g/dL (ref 31.0–37.0)
MEAN CORPUSCULAR HEMOGLOBIN: 25.7 pg — ABNORMAL LOW (ref 26.0–34.0)
MEAN CORPUSCULAR VOLUME: 74.5 fL — ABNORMAL LOW (ref 80.0–100.0)
MEAN PLATELET VOLUME: 7.1 fL (ref 7.0–10.0)
NUCLEATED RED BLOOD CELLS: 1 /100{WBCs} (ref <=4)
PLATELET COUNT: 359 10*9/L (ref 150–440)
RED BLOOD CELL COUNT: 3.37 10*12/L — ABNORMAL LOW (ref 4.00–5.20)
RED CELL DISTRIBUTION WIDTH: 23.8 % — ABNORMAL HIGH (ref 12.0–15.0)

## 2018-01-23 LAB — MAGNESIUM
MAGNESIUM: 1.5 mg/dL — ABNORMAL LOW (ref 1.6–2.2)
Magnesium:MCnc:Pt:Ser/Plas:Qn:: 1.5 — ABNORMAL LOW

## 2018-01-23 LAB — INR: Lab: 1.54

## 2018-01-23 LAB — MEAN PLATELET VOLUME: Lab: 7.1

## 2018-01-23 LAB — TOBRAMYCIN RANDOM: Tobramycin:MCnc:Pt:Ser/Plas:Qn:: 7.1

## 2018-01-23 NOTE — Unmapped (Signed)
Aminoglycoside Therapeutic Monitoring Pharmacy Note    Kayslee Splinter is a 84 y.o. female starting tobramycin. Date of therapy initiation: 01/22/18    Indication: bacteremia/sepsis and UTI    Prior Dosing Information: None/new initiation     Goals:  Therapeutic Drug Levels  ?? Trough level: not applicable  ?? Peak level: not applicable    Additional Clinical Monitoring/Outcomes  Renal function, volume status (intake and output)    Results:   ?? Not applicable   ?? Not applicable    Wt Readings from Last 1 Encounters:   01/21/18 63.9 kg (140 lb 14 oz)     Lab Results   Component Value Date    CREATININE 0.81 01/22/2018       Pharmacokinetic Considerations and Significant Drug Interactions:  ? Adult (estimated initial): Vd = 21.1 L, ke = 0.110 hr-1   ? CrCl (rounding Scr to 1): 35.5 mL/min  ? Concurrent nephrotoxic meds: contrast 11/24, vasopressors    Assessment/Plan:  Recommendation(s)  ? Start tobramycin 447.2 mg (7 mg/kg) x1 by [employer]. May consider q48h regimen if continuing.  ? Estimated peak and trough on recommended regimen: Not applicable - dosing by nomogram    Follow-up  ? Level due: 6-14 hours after the start of the infusion of the first dose  ? A pharmacist will continue to monitor and order levels as appropriate    Please page service pharmacist with questions/clarifications.    Colin Mulders, PharmD

## 2018-01-23 NOTE — Unmapped (Signed)
Geriatrics (MDA) Progress Note    Assessment & Plan:     [name] [name] is a 84 y.o. female with a PMHx of metastatic NSCLC (Stage IV, on crizotinib) c/b b/l malignant effusions s/p bilateraly pleurX catheters, recent hospitalization earlier this month for cellulitis, hx multiple LE ulcers, AFib on elequis, that presented to [facility] with fever and weakness.    Principal Problem:    Sepsis (CMS-HCC)  Active Problems:    Diabetes mellitus, type 2 (CMS-HCC)    Metastatic lung cancer (metastasis from lung to other site), unspecified laterality (CMS-HCC)    Pressure injury of sacral region, stage 2 (CMS-HCC)    Urinary tract infection  Resolved Problems:    * No resolved hospital problems. *      Urosepsis  Hypotensive and febrile with lactate 3.1 on admission. S/p fluid resuscitation with improvement in lactate. Started on Levophed through PIV along with Midodrine 10mg  TID. Now with CVL and a-line in place. Continues to require 2-4 of Norepinephrine for BP support, though was off pressors entirely briefly overnight. Of note patient's a-line in left radial artery generally seems to be reading lower BP compared to cuff on upper right arm; unclear which is most accurate and family denies any known h/o vascular issues in the extremities. 2/2 blood cultures positive for gram negative bacilli, and urine culture positive for Pseudomonas now. Initially on Vanc/Cefepime now Cefepime with Tobra added last night 11/25. Overall patient appears to be slightly improved compared to yesterday; is mentating more appropriately though may have just caught patient at a good time this AM. Will give additional fluid resuscitation with 5% albumin 25g today. In continued discussions regarding goals of care with patient and family continue to pursue aggressive medical route regarding current infection. Will continue to address goals of care as necessary.  - Follow up culture sensitivities; consider abx adjustment pending data  - Cefepime 2g q12h along with Tobra dosed per Pharmacy  - Albumin 5% 25g this AM  - Continue LR at 75 cc/hr  - Continue to attempt to wean off pressors    Delirium  Patient now waxing and waning with inattention, confusion, decreased mental status, and occasionally somewhat verbally and physically aggressive behavior. Most likely related to urosepsis. Will continue to monitor and follow delirium precautions. Does seem a bit more clear, more pleasant, and less confused/aggressive this AM; still intermittently refuses medications. PO intake has not bee great per family with patient not eating much of her meals.    Urinary Retention  Had been discharged home from last hospitalization with foley in place. Successfully completed trial of void and had not been requiring I&O caths. However, concern for retention here so foley placed and continues for now.  - Strict I/Os    Multiple Wounds  Multiple wounds to BLE and significant sacral decubitus ulcer which appear to have been present for some time. No evidence of acute infection. Sacral wound suggests patient recently has not been as functional as initially suspected.  - WOCN consult    Stage IV NSCLC  Complicated by bilateral malignant infusions with PleurX catheters in place and drained qOD. Known mets to liver. Recently saw Heme/Onc and noted to have stable disease. On Crizotinib alone for cancer directed therapy. Holding Crizotinib while septic which Oncology sts is reasonable in curbside consult. Will need to figure out how Crizotinib should be reordered once patient clears sepsis. Does follow with Dr. Legrand Como for Palliative Care. Has had ongoing discussions regarding goals of care; see  most recent note 01/16/2018 for further details. Will continue to address goals of care while admitted; see most recent note dated 11/25 for updates. Concern in particular that should patient code she would be unlikely to have a good outcome.  - Hold Crizotinib  - Drain PleurX catheters every other day (next due tomorrow 11/27)    Anemia of Chronic Disease  Recent admission in early 12/2017 during which patient required 2 units pRBCs for anemia. Hemoglobin stable at 9.8 on admission and remains stable in the 8s after receiving fluid resuscitation. Had been on iron supplementation, but will d/c as patient may not have been taking and concern this will further decrease appetite in patient with already limited PO intake.  - Hold iron supplement    IDDM  A1c 6.4 most recently. Home insulin Novolog 70/30 8 units BID per recent decrease. Had been borderline hypoglycemic at home. Will decrease to 5 units and switch to Glargine while admitted.  - Glargine 5 units qHS with SSI    Daily Checklist:  Diet: Regular Diet  DVT PPx: Patient Already on Full Anticoagulation with Apixaban   GI PPx: Home H2 Blocker  Electrolytes: Potassium Repleted  Code Status: Full Code, though will plan to continue discussions regarding Code Status and GOC as suspect patient would have poor outcome if she required CPR  Dispo: Continue ICU Care    Subjective:   No acute events overnight. Patient seems somewhat more alert/awake and pleasant this AM compared to last night. Still intermittently refuses to take meds. No specific complaints from patient this morning, and family feels that patient is overall doing somewhat better. Specifically denies any pain, dyspnea, nausea, vomiting, or other symptoms. Does report she has an appetite and would like breakfast this AM.    Objective:   Heart Rate:  [62-94] 82  SpO2 Pulse:  [49-78] 62  Resp:  [4-24] 13  BP: (96-187)/(32-96) 98/32  SpO2:  [93 %-100 %] 100 %    Gen: Chronically ill-appearing in NAD, alert, oriented to person only, answers most questions appropriately, somewhat more awake/interactive/pleasant today compared to prior exam  HEENT: atraumatic, sclera anicteric, MMM. OP w/o erythema or exudate, temporal wasting  Heart: Irregularly irregular, normal rate, S1, S2, no M/R/G, no chest wall tenderness  Lungs: Diminished breath sounds worst in right mid-lung and base, no use of accessory muscles  Abdomen: Normoactive bowel sounds, soft, mildly distended unchanged from prior exam, no rebound/guarding  Extremities: No clubbing or cyanosis, BLE pitting edema L > R, multiple wounds with bandages intact  Skin: See sacral wound image in H&P dated 01/21/2018    Labs/Studies: Labs and Studies from the last 24hrs per EMR and Reviewed

## 2018-01-23 NOTE — Unmapped (Signed)
Pt alert, oriented to self only. CAM ICU positive. Combative and resistant to care at times. Afib, rates 70's. Frequent PVC's with frequent runs of vtach (~8+ beats per occasion); md aware. Art line placed today with no complications noted. Norepi continues to infuse at 2-4 mcg/min (see mar) to maintain map > 60. Foley in place, adequate urine output. No BM this shift. Poor PO intake despite encouragement and assistance from family at bedside. Pt repositioned in bed q2, refusing at times. No new skin breakdown noted. Foley care, bath refused. Family at bedside updated on plan of care. Will ctm / maintain safe env.       Problem: Adult Inpatient Plan of Care  Goal: Plan of Care Review  01/22/2018 1646 by Sherrian Divers, RN  Outcome: Progressing  Flowsheets (Taken 01/22/2018 1646)  Progress: no change  Plan of Care Reviewed With: patient;caregiver  01/22/2018 1645 by Sherrian Divers, RN  Outcome: Progressing  Goal: Patient-Specific Goal (Individualization)  01/22/2018 1646 by Sherrian Divers, RN  Outcome: Progressing  01/22/2018 1645 by Sherrian Divers, RN  Outcome: Progressing  Goal: Absence of Hospital-Acquired Illness or Injury  01/22/2018 1646 by Sherrian Divers, RN  Outcome: Progressing  01/22/2018 1645 by Sherrian Divers, RN  Outcome: Progressing  Goal: Optimal Comfort and Wellbeing  01/22/2018 1646 by Sherrian Divers, RN  Outcome: Progressing  01/22/2018 1645 by Sherrian Divers, RN  Outcome: Progressing  Goal: Readiness for Transition of Care  01/22/2018 1646 by Sherrian Divers, RN  Outcome: Progressing  01/22/2018 1645 by Sherrian Divers, RN  Outcome: Progressing  Goal: Rounds/Family Conference  01/22/2018 1646 by Sherrian Divers, RN  Outcome: Progressing  01/22/2018 1645 by Sherrian Divers, RN  Outcome: Progressing     Problem: Skin Injury Risk Increased  Goal: Skin Health and Integrity  01/22/2018 1646 by Sherrian Divers, RN  Outcome: Progressing  01/22/2018 1645 by Sherrian Divers, RN Outcome: Progressing     Problem: Self-Care Deficit  Goal: Improved Ability to Complete Activities of Daily Living  01/22/2018 1646 by Sherrian Divers, RN  Outcome: Progressing  01/22/2018 1645 by Sherrian Divers, RN  Outcome: Progressing     Problem: Fall Injury Risk  Goal: Absence of Fall and Fall-Related Injury  01/22/2018 1646 by Sherrian Divers, RN  Outcome: Progressing  01/22/2018 1645 by Sherrian Divers, RN  Outcome: Progressing     Problem: Diabetes Comorbidity  Goal: Blood Glucose Level Within Desired Range  01/22/2018 1646 by Sherrian Divers, RN  Outcome: Progressing  01/22/2018 1645 by Sherrian Divers, RN  Outcome: Progressing     Problem: Hypertension Comorbidity  Goal: Blood Pressure in Desired Range  01/22/2018 1646 by Sherrian Divers, RN  Outcome: Progressing  01/22/2018 1645 by Sherrian Divers, RN  Outcome: Progressing     Problem: Infection  Goal: Infection Symptom Resolution  01/22/2018 1646 by Sherrian Divers, RN  Outcome: Progressing  01/22/2018 1645 by Sherrian Divers, RN  Outcome: Progressing

## 2018-01-23 NOTE — Unmapped (Signed)
WOCN Consult Services                                                 Wound Evaluation: Pressure Injury    Reason for Consult:   - Lower Extremity Ulcer  - Initial  - Pressure Injury    Problem List:   Principal Problem:    Sepsis (CMS-HCC)  Active Problems:    Diabetes mellitus, type 2 (CMS-HCC)    Metastatic lung cancer (metastasis from lung to other site), unspecified laterality (CMS-HCC)    Pressure injury of sacral region, stage 2 (CMS-HCC)    Urinary tract infection    Assessment: [name] [name] is a 84 y.o. female with PMHx of metastatic NSCLC (Stage IV, on crizotinib) c/b b/l malignant effusions s/p bilateraly pleurX catheters, recent hospitalization earlier this month for cellulitis, hx multiple LE ulcers, AFib on elequis, that presented to [facility] with fever and weakness.     Pt with deep unstageable PI that is stable and does not appear infected, but high risk for infection due to location and severity of wound. I debrided necrotic tissue from wound base at bedside but still 100% slough to bed. At this time we discussed plan and will utilize antimicrobial dressings for wound care. Pending additional discussions for goals of care, we may consider enzymatic debridement next week. Pt with metastatic cancer and does not have building blocks for healing at this time- is also on pressors. Prevention of infection is key at this time. I spoke with pt and family regarding plan and RN/LIPs. Pt to turned L and R only and heels offloaded. Pt also with a small eschar to the left lateral malleolus area from unknown etiology- family states this was an edema blister form previous cellulitis here but the shape is punched out and this deep of an eschar from an edema blister is unusual. Will recommend moist wound healing with wound gel.  ??     Pre debridement      Post debridement 11/26       01/23/18 1500   Wound 01/20/18 Pressure Injury Sacrum Mid Unstageable Date First Assessed/Time First Assessed: 01/20/18 0000   Present on Hospital Admission: Yes  Primary Wound Type: Pressure Injury  Location: Sacrum  Wound Location Orientation: Mid  Staging: Unstageable  Medical Device Related Pressure Injury: No  Stag...   Dressing Status      Changed   Wound Length (cm) 6 cm   Wound Width (cm) 4 cm   Wound Depth (cm) 0.5 cm   Wound Surface Area (cm^2) 24 cm^2   Wound Volume (cm^3) 12 cm^3   Wound Healing % 9   Wound Bed Yellow   Odor None   Peri-wound Assessment      Maceration   Exudate Type      Sero-sanguineous;Yellow   Exudate Amnt      Small   Treatments Cleansed/Irrigation;Other (Comment)  (sharp debridement)   Wound 12/14/17 Skin Tear Knee Left   Date First Assessed: 12/14/17   Primary Wound Type: Skin Tear  Location: Knee  Wound Location Orientation: Left   Dressing Status      Changed   Wound Length (cm) 0.7 cm   Wound Width (cm) 1 cm   Wound Depth (cm) 0.1 cm   Wound Surface Area (cm^2) 0.7 cm^2   Wound Volume (cm^3) 0.07 cm^3  Wound Bed Red   Peri-wound Assessment      Intact   Exudate Type      Sanguineous   Exudate Amnt      Small   Dressing Silicone foam bordered dressing       Continence Status:   Incontinence of bladder: Foley in place  Incontinent of bowel: constipated    Moisture Associated Skin Damage:   - none     Lab Results   Component Value Date    WBC 6.4 01/23/2018    HGB 8.7 (L) 01/23/2018    HCT 25.1 (L) 01/23/2018    A1C 6.4 (H) 01/01/2018    GLU 220 (H) 01/23/2018    POCGLU 225 (H) 01/23/2018    ALBUMIN 2.0 (L) 01/20/2018    PROT 5.2 (L) 01/20/2018     Risk Factors:   - Aging  - Cognitive Impairment  - Friction/shear  - Immobility  - Multiple co-morbidities    Braden Scale Score: 14         Support Surface:   - Low Air Loss - ICU    Type Debridement Completed By WOCN:  N/A    Teaching:  - Improved nutrition   - Moisture management  - Offloading  - Turning and repositioning  - Wound care    WOCN Recommendations: L and R turning only, wedges, optimize nutrition, antimicrobial dressings for prevention, pending goals of care talk, consider enzymatic debridement- if drainage worsens or there are concerns for infection to bone, please consider scan (again, pending goals of care discussion)    - See nursing orders for wound care instructions.  - Contact WOCN with questions, concerns, or wound deterioration.    Topical Therapy/Interventions:   - Antimicrobial Solutions  - moist to moist w DAkins 1/4 strength    Recommended Consults:  - Not Applicable    WOCN Follow Up:  - Weekly    Plan of Care Discussed With:   - Patient  - Family  - RN primary  - LIP resident    Supplies Ordered: No    Workup Time:   75 minutes     Hamilton Capri, RN-BSN, [facility]  [facility] Wound Arboriculturist   [phone]

## 2018-01-23 NOTE — Unmapped (Signed)
PULMONARY CONSULT  NOTE      Patient: Jadda V Mcaffee([birth year])  Reason for consultation: Ms. Huot is a 84 y.o. female who is seen in consultation at the request of Tawnya Crook, MD for comprehensive evaluation of septic shock.    Assessment and Recommendations:      Principal Problem:    Sepsis (CMS-HCC)  Active Problems:    Diabetes mellitus, type 2 (CMS-HCC)    Metastatic lung cancer (metastasis from lung to other site), unspecified laterality (CMS-HCC)    Pressure injury of sacral region, stage 2 (CMS-HCC)    Urinary tract infection  Resolved Problems:    * No resolved hospital problems. *    Patient clearly with mild distributive shock. Suspect urinary source for sepsis, but sacral wound could also be contributing. Continuing pressors and adding midodrine. Continuing IV antimicrobials.  She has gram negative in blood cultures, species pending.      Adrenal insuff may be playing a role as patient unable to wean off small amount of vasopressor. Will check am cortisol.      We appreciate the opportunity to assist in the care of this patient.  Please page [phone] with any questions.    Simonne Martinet, MD    Subjective:      History of Present Illness:  Ms. Murton is a 84 y.o. female with a history of metastatic NSCLC on crizotinib and chronic bilateral malignant pleural effusions admitted for septic shock from presumed urinary source vs. Skin or soft tissue infection. Has received vancomycin and cefepime and adequate fluid resuscitation.  Remains on vasopressors.  Team attempting to place central line but patient is agitated and refusing so they are holding off at the moment and attempting to redirect her with assistance of family.      Review of Systems: A comprehensive review of systems was performed and was negative except as above in HPI  Past Medical History:   Diagnosis Date   ??? Atrial fibrillation (CMS-HCC)    ??? Cancer (CMS-HCC)    ??? Diabetes mellitus (CMS-HCC)    ??? Hypertension    ??? Metastatic lung cancer (metastasis from lung to other site) (CMS-HCC)    ??? Pulmonary embolism (CMS-HCC)      Past Surgical History:   Procedure Laterality Date   ??? CHG RAD GUIDED,PERCUT DRAINAGE,W/CATH PLACE Right 06/19/2017    Procedure: RADIOLOGICAL GUIDANCE, FOR PERCUTANEOUS DRAINAGE, WITH PLACEMENT OF CATHETER, RAD S&I;  Surgeon: Sherwood Gambler, MD;  Location: BRON[facility] PROCEDURE LAB [facility];  Service: Pulmonary   ??? CHOLECYSTECTOMY     ??? HYSTERECTOMY     ??? neck surgey     ??? PR INSERTION INDWELLING TUNNELED PLEURAL CATHETER Bilateral 06/19/2017    Procedure: INSERTION OF INDWELLING TUNNELED PLEURAL CATHETER WITH CUFF WITH MODERATE SEDATION;  Surgeon: Sherwood Gambler, MD;  Location: BRON[facility] PROCEDURE LAB [facility];  Service: Pulmonary   ??? PR INSERTION INDWELLING TUNNELED PLEURAL CATHETER Left 10/26/2017    Procedure: INSERTION OF INDWELLING TUNNELED PLEURAL CATHETER WITH CUFF;  Surgeon: Mercy Moore, MD;  Location: MAIN OR [facility];  Service: Pulmonary   ??? PR MOD SED SAME PHYS/QHP EACH ADDL 15 MINS  06/19/2017    Procedure: MODERATE SEDATION SERVICES PROVIDED BY SAME PHYSICIAN OR OTHER QUALIFIED HEALTH CARE PROFESSIONAL PERFORMING THE DIAGNOSTIC OR THERAPEUTIC SERVICE THAT SEDATION SUPPORTS, EACH ADDITIONAL 15 MINS;  Surgeon: Sherwood Gambler, MD;  Location: BRONCH PROCEDURE LAB [facility];  Service: Pulmonary   ??? PR MOD SED SAME PHYS/QHP INITIAL 15 MINS  5/> YRS  06/19/2017    Procedure: MODERATE SEDATION SERVICES PROVIDED BY SAME PHYSICIAN OR OTHER QUALIFIED HC PROFESSIONAL PERFORMING THE DIAGNOSTIC OR THERAPEUTIC SERVICE THAT SEDATION SUPPORTS, INIT 15 MINS, PT AGE 46 YEARS OR OLDER;  Surgeon: Sherwood Gambler, MD;  Location: BRONCH PROCEDURE LAB [facility];  Service: Pulmonary     Medications reviewed in Epic  Allergies as of 01/20/2018 - Reviewed 01/20/2018   Allergen Reaction Noted   ??? Sulfasalazine Nausea Only 12/22/2014     Family History   Problem Relation Age of Onset   ??? Cancer Brother         lung   ??? Cancer Daughter         lung   ??? Cancer Daughter         breast     Social History     Tobacco Use   ??? Smoking status: Never Smoker   ??? Smokeless tobacco: Never Used   Substance Use Topics   ??? Alcohol use: No        Objective:      Physical Exam:  Vitals:    01/23/18 0852 01/23/18 0900 01/23/18 1000 01/23/18 1018   BP: 100/44 122/57 121/61 98/32   Pulse: 77 87 94 82   Resp: 14 17 14 13    Temp:       TempSrc:       SpO2:       Weight:       Height:         General: Alert, well-appearing, and in no distress.  Eyes: Anicteric sclera, conjunctiva clear.  ENT:  Mucous membranes moist and intact.  Lymph: No cervical or supraclavicular adenopathy.  Lungs: Normal excursion, no dullness to percussion. Good air movement bilaterally, without wheezes or crackles. Normal upper airway sounds without evidence of stridor.  Cardiovascular: Regular rate and rhythm, S1, S2 normal, no murmur, click, rub or gallop appreciated.  Abdomen: Soft, non-tender, not distended, bowel sounds are normal, liver is not enlarged, spleen is not enlarged  Musculoskeletal: No clubbing and no synovitis.  Skin: No rashes or lesions.  Neuro: No focal neurological deficits.    Malnutrition Assessment by RD:          Diagnostic Review:   All labs and images were personally reviewed.

## 2018-01-23 NOTE — Unmapped (Signed)
ADVANCED CARE PLANNING NOTE    Discussion Date:  January 22, 2018    Patient has decisional Capacity:  No    Living Will:  Yes    Surrogate Decision Maker:  Yes    Health Care Power of Attorney:  Yes  Name:  [name] [name]   Contact Information:  [phone]     Discussion Participants:  Michae Kava  Redgie Grayer, MD    Communication of Medical Status/Prognosis:   We discussed that the patient has a serious bloodstream infection requiring IV antibiotics and pressor support to keep her alive.  We discussed that we are hopeful that our treatments will work, but that we cannot be certain that they well.    We also discussed her overall health. When asked about understanding of overall health, HCPOA aware of that she has an uncurable cancer. He reports, however, that she is doing well with treatment, especially for a woman her age, and that the treatment is keeping the cancer stable. He acknowledges she is unlikely to live for 10-15 years, but hopeful that she will do well for a while.     We discussed that the patient is not currently eating, she is delirious, and she presented with multiple pressure ulcers suggesting functional decline. We discussed how these signs may be partially explained by current infection, but also are likely explained by underlying disease.     Communication of Treatment Goals/Options:   We discussed various treatment options including:  1. Aggressive management, including continued care in ICU with central line, A-line, and IV abx  2. IV abx while completing a trial of peripheral pressors x48 hours and then moving our focus towards comfort  3. Transitioning to comfort-focused care now.     We also discussed the role of CPR in this context and the patient's previously expressed wishes. Per HCPOA, when this topic was brought up in oncology clinic, she strongly stated that she wants to fight the cancer as much as possible and that she wants anything done to keep her alive. HCPOA explained that patinet has a really close bond with her grandson who is 59yo old. She has stated on multiple occasions that she wants to live as long as possible for him and that interactions with him are what provide her life meaning. HCPOA is unsure of whether she would still find these interactions meaningful if she lost the ability to verbally interact with her grandson. He was also unsure if she would be okay with potentially needing long-term care in a facility.     Treatment Decisions: Based on patient's previously described goals, HCPOA wanted to honor her wishes by continuing aggressive treatment as below   - Central line and A line placed, aggressive care continued  - Full Code Status maintained  - When this acute episode has resolved and patient's mental status has improved, HCPOA will discuss the topic again with her, especially to help determine whether she would want interventions that might result in her living in facility for extended period of time    I discussed advanced care planning with family for which they wanted to participate in this voluntary service.  I spent a total of 20 minutes providing advanced care planning services for this patient.

## 2018-01-23 NOTE — Unmapped (Signed)
Aminoglycoside Therapeutic Monitoring Pharmacy Note    [name] [name] is a 84 y.o. female continuing tobramycin. Date of therapy initiation: 01/22/18    Indication: bacteremia/sepsis    Prior Dosing Information: 7 mg/kg (447 mg) IV once (dosing based off [place] nomogram)     Goals:  Therapeutic Drug Levels  ?? Trough level: not applicable  ?? Peak level: not applicable    Additional Clinical Monitoring/Outcomes  Renal function, volume status (intake and output)    Results:   ?? Not applicable   ?? Random level: 7.1 mg/L, drawn 11.5 hours after dose given    Wt Readings from Last 1 Encounters:   01/21/18 63.9 kg (140 lb 14 oz)     Lab Results   Component Value Date    CREATININE 0.67 01/23/2018       Pharmacokinetic Considerations and Significant Drug Interactions:  ? dosing based off [place] nomogram  ? Concurrent nephrotoxic meds: contrast (01/21/18), norepinephrine    Assessment/Plan:  Recommendation(s)  ? Do NOT continue or redose tobramycin  ? Estimated peak and trough on recommended regimen: NA, tobramycin will not be continued  ? Blood cultures positive for Klebsiella, metronidazole 500 mg IV q8hr started 11/26    Follow-up  ? Level due: NA, tobramycin will not be continued  ? A pharmacist will continue to monitor and order levels as appropriate    Please page service pharmacist with questions/clarifications.    Elvis Coil, PharmD Candidate     Okey Regal, PharmD  PGY1 Acute Care Pharmacy Resident

## 2018-01-23 NOTE — Unmapped (Signed)
Pt with new CVC and A line.  All tubing changed and then attached to cvc.  PIVs SL.  Foley-adequate uop.  Cuff pressures reading higher than positional a line.  At 2 mcg of levo cuff= 155/49, a line=103/50.  Attempts to wean levo off.  See flowsheet for bp values with levo off.  Aline pressure was reading in 70's and cuff MAP was 60.  Irregular heart rhythm, p waves noticed.  Mag replaced.  Pt refuses meds.  Only able to get the midodrine in her last night.  She is uncooperative with care.  Drinks ok when given ensure.  Daughter at bedside, tried to help get pt to be cooperative but really not successful.  Mittens have been removed since they seemed to make pt more agitated.  She has not attempts to pull at her lines.  Poor appetite and food intake.  Did not eat any dinner.  No stool this shift.  Pt refusing senno.  Pt refusing several turns tonight.  Becomes agitated and tries to hit and push away staff.  Unable so far to change sheets and assess back side wounds.  Legs wounds are dry and healing, open to air.  Skin warm and dry, no distress.  Temp is now normal.      NO falls this shift.     HTN not an issue as BP is low right now and on pressors.      Problem: Adult Inpatient Plan of Care  Goal: Plan of Care Review  Outcome: Ongoing - Unchanged  Goal: Patient-Specific Goal (Individualization)  Outcome: Ongoing - Unchanged  Goal: Absence of Hospital-Acquired Illness or Injury  Outcome: Ongoing - Unchanged  Goal: Optimal Comfort and Wellbeing  Outcome: Ongoing - Unchanged  Goal: Readiness for Transition of Care  Outcome: Ongoing - Unchanged  Goal: Rounds/Family Conference  Outcome: Ongoing - Unchanged     Problem: Skin Injury Risk Increased  Goal: Skin Health and Integrity  Outcome: Ongoing - Unchanged     Problem: Self-Care Deficit  Goal: Improved Ability to Complete Activities of Daily Living  Outcome: Ongoing - Unchanged     Problem: Fall Injury Risk  Goal: Absence of Fall and Fall-Related Injury  Outcome: Ongoing - Unchanged     Problem: Diabetes Comorbidity  Goal: Blood Glucose Level Within Desired Range  Outcome: Ongoing - Unchanged     Problem: Hypertension Comorbidity  Goal: Blood Pressure in Desired Range  Outcome: Ongoing - Unchanged     Problem: Infection  Goal: Infection Symptom Resolution  Outcome: Ongoing - Unchanged

## 2018-01-23 NOTE — Unmapped (Signed)
Central Venous Catheter Insertion Procedure Note    Patient Name:: [name] [name]  Patient MRN: [id number]    Attending: Tawnya Crook, MD  Supervising Resident: Redgie Grayer, MD PGY-3  Performing Resident: Everlean Cherry, MD PGY-1    Line type:  Triple Lumen    Indications:  Medications requiring central access    Procedure Details:   Informed consent was obtained after explanation of the risks and benefits of the procedure, refer to the consent documentation.    Time-out was performed immediately prior to the procedure.    The left internal jugular vein was identified using bedside ultrasound. This area was prepped and draped in the usual sterile fashion. Maximum sterile technique was used including antiseptics, cap, gloves, gown, hand hygiene, mask, and sterile sheet. The patient was placed in Trendelenburg position. Local anesthesia with 1% lidocaine was applied subcutaneously then deep to the skin. The introducer was then inserted into the internal jugular vein using ultrasound guidance. Wire was inserted and advanced easily. Placement in the internal jugular vein was confirmed with transverse and longitudinal ultrasound views demonstrating wire in the lumen of the internal jugular.    Using the Seldinger Technique a Triple Lumen was placed with each port easily flushed and freely drawing venous blood.    The catheter was secured with sutures. A sterile bandage was placed over the site.    Condition:  The patient tolerated the procedure well and remains in the same condition as pre-procedure.    Complications:  None; patient tolerated the procedure well.    Plan:  CXR was ordered to verify placement.

## 2018-01-24 LAB — CBC
HEMOGLOBIN: 8.5 g/dL — ABNORMAL LOW (ref 13.5–16.0)
MEAN CORPUSCULAR HEMOGLOBIN CONC: 34.3 g/dL (ref 31.0–37.0)
MEAN CORPUSCULAR VOLUME: 75.2 fL — ABNORMAL LOW (ref 80.0–100.0)
MEAN PLATELET VOLUME: 7 fL (ref 7.0–10.0)
PLATELET COUNT: 332 10*9/L (ref 150–440)
RED BLOOD CELL COUNT: 3.31 10*12/L — ABNORMAL LOW (ref 4.00–5.20)
RED CELL DISTRIBUTION WIDTH: 24 % — ABNORMAL HIGH (ref 12.0–15.0)
WBC ADJUSTED: 5.2 10*9/L (ref 4.5–11.0)

## 2018-01-24 LAB — MEAN PLATELET VOLUME: Lab: 7

## 2018-01-24 LAB — BASIC METABOLIC PANEL
ANION GAP: <1 mmol/L — ABNORMAL LOW (ref 7–15)
BLOOD UREA NITROGEN: 21 mg/dL (ref 7–21)
BUN / CREAT RATIO: 33
CALCIUM: 8.4 mg/dL — ABNORMAL LOW (ref 8.5–10.2)
CHLORIDE: 109 mmol/L — ABNORMAL HIGH (ref 98–107)
CO2: 27 mmol/L (ref 22.0–30.0)
CREATININE: 0.64 mg/dL (ref 0.60–1.00)
EGFR CKD-EPI AA FEMALE: >90 mL/min/{1.73_m2} (ref >=60)
GLUCOSE RANDOM: 105 mg/dL (ref 65–179)
POTASSIUM: 3.3 mmol/L — ABNORMAL LOW (ref 3.5–5.0)

## 2018-01-24 LAB — PROTIME: Lab: 18.9 — ABNORMAL HIGH

## 2018-01-24 LAB — PROTIME-INR: PROTIME: 18.9 s — ABNORMAL HIGH (ref 10.2–13.1)

## 2018-01-24 LAB — BLOOD UREA NITROGEN: Urea nitrogen:MCnc:Pt:Ser/Plas:Qn:: 21

## 2018-01-24 NOTE — Unmapped (Signed)
Pt alert, oriented to self only. Verbally / physically aggressive at times. Refusing meds / turns at times. Afib / sinus arrhythmia with rates 80's. Apparent afib / RVR to 120's with exertion / agitation. BP above map goals of 60 with norepi infusing at 2 min. Afebrile. Tylenol x 1 for pain. Foley in place, urine output adequate. No BM this shift; suppository given. PO intake remains poor. Family at bedside, updated on plan of care. Will ctm / maintain safe env.       Problem: Adult Inpatient Plan of Care  Goal: Plan of Care Review  Outcome: Progressing  Goal: Patient-Specific Goal (Individualization)  Outcome: Progressing  Goal: Absence of Hospital-Acquired Illness or Injury  Outcome: Progressing  Goal: Optimal Comfort and Wellbeing  Outcome: Progressing  Goal: Readiness for Transition of Care  Outcome: Progressing  Goal: Rounds/Family Conference  Outcome: Progressing     Problem: Skin Injury Risk Increased  Goal: Skin Health and Integrity  Outcome: Progressing     Problem: Self-Care Deficit  Goal: Improved Ability to Complete Activities of Daily Living  Outcome: Progressing     Problem: Fall Injury Risk  Goal: Absence of Fall and Fall-Related Injury  Outcome: Progressing     Problem: Diabetes Comorbidity  Goal: Blood Glucose Level Within Desired Range  Outcome: Progressing     Problem: Hypertension Comorbidity  Goal: Blood Pressure in Desired Range  Outcome: Progressing     Problem: Infection  Goal: Infection Symptom Resolution  Outcome: Progressing

## 2018-01-24 NOTE — Unmapped (Signed)
Geriatrics (MDA) Progress Note    Assessment & Plan:     [name] [name] is a 84 y.o. female with a PMHx of metastatic NSCLC (Stage IV, on crizotinib) c/b b/l malignant effusions s/p bilateraly pleurX catheters, recent hospitalization earlier this month for cellulitis, hx multiple LE ulcers, AFib on elequis, that presented to [facility] with fever and weakness.    Principal Problem:    Sepsis (CMS-HCC)  Active Problems:    Diabetes mellitus, type 2 (CMS-HCC)    Metastatic lung cancer (metastasis from lung to other site), unspecified laterality (CMS-HCC)    Pressure injury of sacral region, stage 2 (CMS-HCC)    Urinary tract infection  Resolved Problems:    * No resolved hospital problems. *      Urosepsis  Hypotensive and febrile with lactate 3.1 on admission. S/p fluid resuscitation with improvement in lactate. Started on Levophed through PIV along with Midodrine 10mg  TID. Now with CVL and a-line in place. Continues to require 2-4 of Norepinephrine for BP support, though was off pressors entirely for a short time this morning. Of note patient's a-line in left radial artery generally seems to be reading lower BP compared to cuff on upper right arm; unclear which is most accurate and family denies any known h/o vascular issues in the extremities. I wonder if patient's pressures may truly be higher and we're just getting poor readings in her LUE. 2/2 blood cultures positive for gram negative bacilli, and urine culture positive for Pseudomonas now. 1 of 2 blood cultures now growing Klebsiella sensitive to all but Ampicillin, and 2nd blood culture reportedly positive for anaerobe, but awaiting speciation. Initially on Vanc/Cefepime now Cefepime with Tobra, and Flagyl added last night 11/26 for anaerobic coverage. Will not redose Tobra and continue Cefepime and Flagyl for now pending further culture data. Overall patient appears to be slowly and gradually improving; is mentating more appropriately though again may have just caught patient at a good time this AM. In continued discussions regarding goals of care with patient and family continue to pursue aggressive medical route regarding current infection. Will continue to address goals of care as necessary.  - Follow up cultures/sensitivities; consider abx adjustment pending data  - Cefepime 2g q12h along with Flagyl 500mg  q8h  - Continue LR at 75 cc/hr  - Continue to attempt to wean off pressors    Delirium  Patient now waxing and waning with inattention, confusion, decreased mental status, and occasionally somewhat verbally and physically aggressive behavior. Most likely related to urosepsis. Will continue to monitor and follow delirium precautions. Does continue to seem a bit more clear, more pleasant, and less confused/aggressive this AM; still intermittently refuses medications. PO intake has not bee great per family with patient not eating much of her meals. Patient did eat some breakfast this morning and requested salt for her food. Will see if patient eats more with salting food.    Urinary Retention  Had been discharged home from last hospitalization with foley in place. Successfully completed trial of void and had not been requiring I&O caths. However, concern for retention here so foley placed and continues for now.  - Strict I/Os    Multiple Wounds  Multiple wounds to BLE and significant sacral decubitus ulcer which appear to have been present for some time. No evidence of acute infection. Sacral wound suggests patient recently has not been as functional as initially suspected. Seen by [facility] 11/26 who recommend antimicrobial dressings/Dakins, nutrition optimization, possible enzymatic debridement, and offloading area  of wound.  - Appreciate WOCN recommendations    Stage IV NSCLC  Complicated by bilateral malignant infusions with PleurX catheters in place and drained qOD. Known mets to liver. Recently saw Heme/Onc and noted to have stable disease. On Crizotinib alone for cancer directed therapy. Holding Crizotinib while septic which Oncology sts is reasonable in curbside consult. Will need to figure out how Crizotinib should be reordered once patient clears sepsis. Does follow with Dr. Legrand Como for Palliative Care. Has had ongoing discussions regarding goals of care; see most recent note 01/16/2018 for further details. Will continue to address goals of care while admitted; see most recent note dated 11/25 for updates. Concern in particular that should patient code she would be unlikely to have a good outcome.  - Hold Crizotinib  - Drain PleurX catheters every other day (due today 11/27)    Anemia of Chronic Disease  Recent admission in early 12/2017 during which patient required 2 units pRBCs for anemia. Hemoglobin stable at 9.8 on admission and remains stable in the 8s after receiving fluid resuscitation. Had been on iron supplementation, but will d/c as patient may not have been taking and concern this will further decrease appetite in patient with already limited PO intake.  - Hold iron supplement    IDDM  A1c 6.4 most recently. Home insulin Novolog 70/30 8 units BID per recent decrease. Had been borderline hypoglycemic at home. Will decrease to 5 units and switch to Glargine while admitted.  - Glargine 5 units qHS with SSI    Daily Checklist:  Diet: Regular Diet  DVT PPx: Patient Already on Full Anticoagulation with Apixaban   GI PPx: Home H2 Blocker  Electrolytes: Potassium Repleted  Code Status: Full Code, though will plan to continue discussions regarding Code Status and GOC as suspect patient would have poor outcome if she required CPR  Dispo: Continue ICU Care    Subjective:   No acute events overnight. Patient's mental status continues to wax and wane. Taken off pressors and maintained MAPs in 60s for about 1.5 hours this AM, but dropped into 40-50s again so placed back on Norepi at 2. Patient denies any complaints this morning and reports she overall feels well. Continues to have poor PO intake, but has been drinking some fluids and Ensure. Eating a little breakfast this morning, but complains her food needs more salt which we happily obliged. Specifically denies any pain, nausea, or dyspnea. Despite not eating much patient reports her appetite is good.    Objective:   Heart Rate:  [69-120] 93  Resp:  [11-23] 16  BP: (77-144)/(34-87) 85/65  SpO2:  [92 %-100 %] 100 %    Gen: Chronically ill-appearing in NAD, alert, oriented to person only, answers most questions appropriately, somewhat more awake/interactive/pleasant today similar to yesterday's exam  HEENT: atraumatic, sclera anicteric, MMM. OP w/o erythema or exudate, temporal wasting  Heart: Irregularly irregular, normal rate, S1, S2, no M/R/G, no chest wall tenderness  Lungs: Diminished breath sounds worst in right mid-lung and base, no use of accessory muscles  Abdomen: Normoactive bowel sounds, soft, mildly distended unchanged from prior exam, no rebound/guarding  Extremities: No clubbing or cyanosis, BLE pitting edema L > R, multiple wounds with bandages intact  Skin: See sacral wound image in H&P dated 01/21/2018    Labs/Studies: Labs and Studies from the last 24hrs per EMR and Reviewed

## 2018-01-24 NOTE — Unmapped (Signed)
Pt alert, oriented to self only. Verbally / physically aggressive at times. Refusing meds / turns at times. Afib / sinus arrhythmia with rates 80's. Apparent afib / RVR to 120's with exertion / agitation. BP above map goals of 60 with norepi infusing at 2-4 mcg/min. Afebrile. Tylenol x 1 for pain associated with wound care. Foley in place, urine output adequate. No BM this shift. PO intake remains poor. Family at bedside, updated on plan of care. Will ctm / maintain safe env.       Problem: Adult Inpatient Plan of Care  Goal: Plan of Care Review  Outcome: Progressing  Goal: Patient-Specific Goal (Individualization)  Outcome: Progressing  Goal: Absence of Hospital-Acquired Illness or Injury  Outcome: Progressing  Goal: Optimal Comfort and Wellbeing  Outcome: Progressing  Goal: Readiness for Transition of Care  Outcome: Progressing  Goal: Rounds/Family Conference  Outcome: Progressing     Problem: Skin Injury Risk Increased  Goal: Skin Health and Integrity  Outcome: Progressing     Problem: Self-Care Deficit  Goal: Improved Ability to Complete Activities of Daily Living  Outcome: Progressing     Problem: Fall Injury Risk  Goal: Absence of Fall and Fall-Related Injury  Outcome: Progressing     Problem: Diabetes Comorbidity  Goal: Blood Glucose Level Within Desired Range  Outcome: Progressing     Problem: Hypertension Comorbidity  Goal: Blood Pressure in Desired Range  Outcome: Progressing     Problem: Infection  Goal: Infection Symptom Resolution  Outcome: Progressing

## 2018-01-25 LAB — POTASSIUM: Potassium:SCnc:Pt:Ser/Plas:Qn:: 3.5

## 2018-01-25 LAB — BASIC METABOLIC PANEL
ANION GAP: <1 mmol/L — ABNORMAL LOW (ref 7–15)
ANION GAP: <1 mmol/L — ABNORMAL LOW (ref 7–15)
BLOOD UREA NITROGEN: 18 mg/dL (ref 7–21)
BLOOD UREA NITROGEN: 20 mg/dL (ref 7–21)
BLOOD UREA NITROGEN: 19 mg/dL (ref 7–21)
BUN / CREAT RATIO: 33
BUN / CREAT RATIO: 36
BUN / CREAT RATIO: 35
CALCIUM: 7.9 mg/dL — ABNORMAL LOW (ref 8.5–10.2)
CALCIUM: 7.7 mg/dL — ABNORMAL LOW (ref 8.5–10.2)
CALCIUM: 7.7 mg/dL — ABNORMAL LOW (ref 8.5–10.2)
CHLORIDE: 111 mmol/L — ABNORMAL HIGH (ref 98–107)
CHLORIDE: 110 mmol/L — ABNORMAL HIGH (ref 98–107)
CHLORIDE: 109 mmol/L — ABNORMAL HIGH (ref 98–107)
CO2: 28 mmol/L (ref 22.0–30.0)
CO2: 27 mmol/L (ref 22.0–30.0)
CREATININE: 0.55 mg/dL — ABNORMAL LOW (ref 0.60–1.00)
CREATININE: 0.55 mg/dL — ABNORMAL LOW (ref 0.60–1.00)
CREATININE: 0.54 mg/dL — ABNORMAL LOW (ref 0.60–1.00)
EGFR CKD-EPI AA FEMALE: >90 mL/min/{1.73_m2} (ref >=60)
EGFR CKD-EPI AA FEMALE: >90 mL/min/{1.73_m2} (ref >=60)
EGFR CKD-EPI NON-AA FEMALE: 86 mL/min/{1.73_m2} (ref >=60)
EGFR CKD-EPI NON-AA FEMALE: 86 mL/min/{1.73_m2} (ref >=60)
GLUCOSE RANDOM: 103 mg/dL (ref 65–179)
GLUCOSE RANDOM: 225 mg/dL — ABNORMAL HIGH (ref 65–179)
GLUCOSE RANDOM: 205 mg/dL — ABNORMAL HIGH (ref 65–179)
POTASSIUM: 2.6 mmol/L — CL (ref 3.5–5.0)
POTASSIUM: 3.5 mmol/L (ref 3.5–5.0)
POTASSIUM: 3.5 mmol/L (ref 3.5–5.0)
SODIUM: 136 mmol/L (ref 135–145)
SODIUM: 134 mmol/L — ABNORMAL LOW (ref 135–145)
SODIUM: 135 mmol/L (ref 135–145)

## 2018-01-25 LAB — CBC
HEMATOCRIT: 22.7 % — ABNORMAL LOW (ref 36.0–46.0)
MEAN CORPUSCULAR HEMOGLOBIN CONC: 35.2 g/dL (ref 31.0–37.0)
MEAN CORPUSCULAR HEMOGLOBIN: 26.2 pg (ref 26.0–34.0)
MEAN CORPUSCULAR VOLUME: 74.4 fL — ABNORMAL LOW (ref 80.0–100.0)
MEAN PLATELET VOLUME: 7.1 fL (ref 7.0–10.0)
NUCLEATED RED BLOOD CELLS: 1 /100{WBCs} (ref <=4)
PLATELET COUNT: 297 10*9/L (ref 150–440)
RED BLOOD CELL COUNT: 3.05 10*12/L — ABNORMAL LOW (ref 4.00–5.20)
RED CELL DISTRIBUTION WIDTH: 23.8 % — ABNORMAL HIGH (ref 12.0–15.0)
WBC ADJUSTED: 4.7 10*9/L (ref 4.5–11.0)

## 2018-01-25 LAB — INR: Lab: 1.49

## 2018-01-25 LAB — ANION GAP: Anion gap 3:SCnc:Pt:Ser/Plas:Qn:: <1 — ABNORMAL LOW

## 2018-01-25 LAB — C-REACTIVE PROTEIN: C reactive protein:MCnc:Pt:Ser/Plas:Qn:: 83.2 — ABNORMAL HIGH

## 2018-01-25 LAB — MAGNESIUM
Magnesium:MCnc:Pt:Ser/Plas:Qn:: 1.4 — ABNORMAL LOW
Magnesium:MCnc:Pt:Ser/Plas:Qn:: 1.9
Magnesium:MCnc:Pt:Ser/Plas:Qn:: 2

## 2018-01-25 LAB — CO2: Carbon dioxide:SCnc:Pt:Ser/Plas:Qn:: 28

## 2018-01-25 LAB — SLIDE SCAN: Lab: 0

## 2018-01-25 LAB — LACTATE BLOOD VENOUS: Lactate:SCnc:Pt:BldV:Qn:: 0.5

## 2018-01-25 LAB — ERYTHROCYTE SEDIMENTATION RATE: Lab: 64 — ABNORMAL HIGH

## 2018-01-25 LAB — PROTIME-INR: PROTIME: 17.3 s — ABNORMAL HIGH (ref 10.2–13.1)

## 2018-01-25 NOTE — Unmapped (Signed)
Neuro: Pt ranges from Oriented to self to oriented x3. CAM-ICU +. PERRL. No prn's given. Pt is refusing all PO medications and all care.  MD aware.    CV: ICU status. HR Sinus arrhythmia, 90's. BP 80-100's SBP/50-60's DBP. All MAPs above 65 with norepi @ 2. Unable to wean.  Afebrile. Pulses weak but palpable.   Resp: RA, no desaturations noted.   GI/GU: Foley intact/present. UOP adequate. Unknown date of last BM. Poor PO intake. ACHS, blood glucose dropped <70 overnight, gave 12.5 of D50, new blood glucose this morning was 100.  Lantus given overnight, but no corrective insulin given.   Lines: L radial art line, intact and functional. L TL IJ with LR @ 75, NSC x1, and norepi @ 2.   Wounds: Pt refused bath, oral care, and wound dressing changes.  Has wet to dry with Dankins on sacrum, LLE wounds.    Daughter is bedside and attempted to help with care, but pt still refused all care.  Plan of care meeting w/family needs to take place. Q2H turns. Pt safe/free from falls.     Problem: Adult Inpatient Plan of Care  Goal: Plan of Care Review  Outcome: Progressing  Goal: Patient-Specific Goal (Individualization)  Outcome: Progressing  Goal: Absence of Hospital-Acquired Illness or Injury  Outcome: Progressing  Goal: Optimal Comfort and Wellbeing  Outcome: Progressing  Goal: Readiness for Transition of Care  Outcome: Progressing  Goal: Rounds/Family Conference  Outcome: Progressing     Problem: Skin Injury Risk Increased  Goal: Skin Health and Integrity  Outcome: Progressing     Problem: Self-Care Deficit  Goal: Improved Ability to Complete Activities of Daily Living  Outcome: Progressing     Problem: Fall Injury Risk  Goal: Absence of Fall and Fall-Related Injury  Outcome: Progressing     Problem: Diabetes Comorbidity  Goal: Blood Glucose Level Within Desired Range  Outcome: Progressing     Problem: Hypertension Comorbidity  Goal: Blood Pressure in Desired Range  Outcome: Progressing     Problem: Infection  Goal: Infection Symptom Resolution  Outcome: Progressing

## 2018-01-25 NOTE — Unmapped (Signed)
Geriatrics (MDA) Progress Note    Assessment & Plan:     [name] [name] is a 84 y.o. female with a PMHx of metastatic NSCLC (Stage IV, on crizotinib) c/b b/l malignant effusions s/p bilateral pleurX catheters, hx multiple LE ulcers, AFib on elequis, here with septic shock and Klebsiella/Bacteroides bacteremia. Overall improving but with lingering small pressor requirement.    Principal Problem:    Sepsis (CMS-HCC)  Active Problems:    Diabetes mellitus, type 2 (CMS-HCC)    Metastatic lung cancer (metastasis from lung to other site), unspecified laterality (CMS-HCC)    Pressure injury of sacral region, stage 2 (CMS-HCC)    Urinary tract infection  Resolved Problems:    * No resolved hospital problems. *      Septic shock, Klebsiella and Bacteroides bacteremia  Stable, but unable to wean from 2 mcg/min of norepi. BP cuff on L arm correlates with a-line, BP cuff on R arm with higher BP. BCx growing Klebsiella pneumoniae and Bacteroides fragilis, suspect source is intraabdominal given metastatic cancer likely invading duodenum and likely malignant ascites. UCx growing PsA, may represent colonization. No focal fluid collection or abscess on CT AP on admission. Suspect pulmonary source unlikely, pleurX catheters draining pleural fluid freely. AM cortisol 22.2, making adrenal insufficiency unlikely.  - Cefepime 2g q12h along with Flagyl 500mg  q8h  - Continue LR at 75 cc/hr  - Midodrine 10 mg TID, although patient frequently refuses PO meds when family not at bedside  - Given clinical improvement, will trial off norepi and go off of R arm cuff BP, check lactate to ensure no organ hypoperfusion, and if stable will remove a-line.    Toxic metabolic encephalopathy, delirium  Slowly improving. Waxing/waning mental status, at times agitated, better with family at bedside.  - Delirium precautions    Urinary Retention  Had been discharged home from last hospitalization with foley in place. Successfully completed trial of void and had not been requiring I&O caths. However, concern for retention here so foley placed and continues for now.  - Strict I/Os    Multiple Wounds (LEs and sacral decub)  Multiple wounds to BLE and significant sacral decubitus ulcer which appear to have been present for some time. No evidence of acute infection. Sacral wound suggests patient recently has not been as functional as initially suspected. Seen by [facility] 11/26 who recommend antimicrobial dressings/Dakins, nutrition optimization, possible enzymatic debridement, and offloading area of wound.  - Appreciate WOCN recommendations    Stage IV NSCLC  Complicated by bilateral malignant infusions with PleurX catheters in place and drained qOD. Known mets to liver. Recently saw Heme/Onc and noted to have stable disease. On Crizotinib alone for cancer directed therapy. Holding Crizotinib while septic which Oncology sts is reasonable in curbside consult. Will need to figure out how Crizotinib should be reordered once patient clears sepsis. Does follow with Dr. Legrand Como for Palliative Care. Has had ongoing discussions regarding goals of care; see most recent note 01/16/2018 for further details. Will continue to address goals of care while admitted; see most recent note dated 11/25 for updates. Concern in particular that should patient code she would be unlikely to have a good outcome.  - Hold Crizotinib  - Drain PleurX catheters every other day (due today 11/29)    Anemia of Chronic Disease, IDA  Recent admission in early 12/2017 during which patient required 2 units pRBCs for anemia. Hemoglobin stable at 9.8 on admission and remains stable in the 8s after receiving fluid resuscitation.  Had been on iron supplementation, but will d/c as patient may not have been taking and concern this will further decrease appetite in patient with already limited PO intake.  - Hold iron supplement    IDDM  A1c 6.4 most recently. Home insulin Novolog 70/30 8 units BID per recent decrease. Had been borderline hypoglycemic at home. Will decrease to 5 units and switch to Glargine while admitted.  - Glargine 5 units qHS with SSI    Daily Checklist:  Diet: Regular Diet  DVT PPx: Patient Already on Full Anticoagulation with Apixaban   GI PPx: Home H2 Blocker  Electrolytes: Potassium Repleted  Code Status: Full Code, though will plan to continue discussions regarding Code Status and GOC as suspect patient would have poor outcome if she required CPR  Dispo: Continue ICU Care    Subjective:   Continues to require small norepi infusion. Cuff pressures on R arm consistently significantly higher than arterial line pressures, on L arm consistent with a line readings. No new complaints.    Objective:   Heart Rate:  [77-130] 99  Resp:  [11-28] 17  BP: (77-147)/(34-85) 119/69  SpO2:  [93 %-100 %] 93 %    Gen: Chronically ill-appearing in NAD, alert  Heart: Irregularly irregular, normal rate, S1, S2, no M/R/G, no chest wall tenderness  Lungs: Diminished breath sounds worst in right mid-lung and base, no use of accessory muscles  Abdomen: Normoactive bowel sounds, soft, mildly distended unchanged from prior exam, no rebound/guarding  Extremities: No clubbing or cyanosis, BLE pitting edema L > R, multiple wounds with bandages intact    Labs/Studies: Labs and Studies from the last 24hrs per EMR and Reviewed     Arcola Jansky, MD

## 2018-01-26 LAB — CBC
HEMOGLOBIN: 5 g/dL — ABNORMAL LOW (ref 13.5–16.0)
MEAN CORPUSCULAR HEMOGLOBIN CONC: 34.6 g/dL (ref 31.0–37.0)
MEAN CORPUSCULAR HEMOGLOBIN: 26.1 pg (ref 26.0–34.0)
MEAN CORPUSCULAR VOLUME: 75.5 fL — ABNORMAL LOW (ref 80.0–100.0)
MEAN PLATELET VOLUME: 7 fL (ref 7.0–10.0)
NUCLEATED RED BLOOD CELLS: 0 /100{WBCs} (ref <=4)
PLATELET COUNT: 280 10*9/L (ref 150–440)
RED BLOOD CELL COUNT: 1.92 10*12/L — ABNORMAL LOW (ref 4.00–5.20)
RED CELL DISTRIBUTION WIDTH: 24 % — ABNORMAL HIGH (ref 12.0–15.0)
WBC ADJUSTED: 4.9 10*9/L (ref 4.5–11.0)

## 2018-01-26 LAB — CBC W/ AUTO DIFF
HEMATOCRIT: 22 % — ABNORMAL LOW (ref 36.0–46.0)
HEMOGLOBIN: 7.6 g/dL — ABNORMAL LOW (ref 13.5–16.0)
MEAN CORPUSCULAR HEMOGLOBIN CONC: 34.5 g/dL (ref 31.0–37.0)
MEAN CORPUSCULAR HEMOGLOBIN: 25.7 pg — ABNORMAL LOW (ref 26.0–34.0)
MEAN CORPUSCULAR VOLUME: 74.6 fL — ABNORMAL LOW (ref 80.0–100.0)
MEAN PLATELET VOLUME: 7.5 fL (ref 7.0–10.0)
NUCLEATED RED BLOOD CELLS: 0 /100{WBCs} (ref <=4)
PLATELET COUNT: 242 10*9/L (ref 150–440)
WBC ADJUSTED: 4.6 10*9/L (ref 4.5–11.0)

## 2018-01-26 LAB — HEMATOCRIT: Lab: 22 — ABNORMAL LOW

## 2018-01-26 LAB — MANUAL DIFFERENTIAL
BASOPHILS - ABS (DIFF): 0 10*9/L (ref 0.0–0.1)
BASOPHILS - REL (DIFF): 0 %
EOSINOPHILS - REL (DIFF): 0 %
LYMPHOCYTES - ABS (DIFF): 0.2 10*9/L — ABNORMAL LOW (ref 1.5–5.0)
LYMPHOCYTES - REL (DIFF): 4 %
MONOCYTES - ABS (DIFF): 0.1 10*9/L — ABNORMAL LOW (ref 0.2–0.8)
MONOCYTES - REL (DIFF): 2 %
NEUTROPHILS - ABS (DIFF): 4.3 10*9/L (ref 2.0–7.5)
NEUTROPHILS - REL (DIFF): 94 %

## 2018-01-26 LAB — LYMPHOCYTES - ABS (DIFF): Lab: 0.2 — ABNORMAL LOW

## 2018-01-26 LAB — EGFR CKD-EPI AA FEMALE: Lab: >90

## 2018-01-26 LAB — BASIC METABOLIC PANEL
ANION GAP: <1 mmol/L — ABNORMAL LOW (ref 7–15)
BLOOD UREA NITROGEN: 20 mg/dL (ref 7–21)
BUN / CREAT RATIO: 36
CALCIUM: 8.2 mg/dL — ABNORMAL LOW (ref 8.5–10.2)
CHLORIDE: 110 mmol/L — ABNORMAL HIGH (ref 98–107)
CREATININE: 0.55 mg/dL — ABNORMAL LOW (ref 0.60–1.00)
EGFR CKD-EPI AA FEMALE: >90 mL/min/{1.73_m2} (ref >=60)
EGFR CKD-EPI NON-AA FEMALE: 86 mL/min/{1.73_m2} (ref >=60)
GLUCOSE RANDOM: 185 mg/dL — ABNORMAL HIGH (ref 65–179)
POTASSIUM: 4 mmol/L (ref 3.5–5.0)
SODIUM: 134 mmol/L — ABNORMAL LOW (ref 135–145)

## 2018-01-26 LAB — MEAN PLATELET VOLUME: Lab: 7

## 2018-01-26 LAB — PROTIME-INR: PROTIME: 18.8 s — ABNORMAL HIGH (ref 10.2–13.1)

## 2018-01-26 LAB — INR: Lab: 1.62

## 2018-01-26 NOTE — Unmapped (Signed)
Problem: Adult Inpatient Plan of Care  Goal: Plan of Care Review  Outcome: Progressing  Flowsheets (Taken 01/26/2018 1548)  Progress: improving  Plan of Care Reviewed With: patient  Note:   Patient downgraded to intermediate status, BP stable, remains in NSR 90s-110. B/L Pleurx drains drained, output total, pt tolerated well. Foley d/cd, pt unable to uninate on own thus far, will c/t to monitor via bladder scan and I&O per order. PRN pain interventions effective, repositioned q2hrs with no evidence new skin breakdown.  Goal: Patient-Specific Goal (Individualization)  Outcome: Progressing  Goal: Absence of Hospital-Acquired Illness or Injury  Outcome: Progressing  Goal: Optimal Comfort and Wellbeing  Outcome: Progressing  Goal: Readiness for Transition of Care  Outcome: Progressing  Goal: Rounds/Family Conference  Outcome: Progressing     Problem: Skin Injury Risk Increased  Goal: Skin Health and Integrity  Outcome: Progressing     Problem: Self-Care Deficit  Goal: Improved Ability to Complete Activities of Daily Living  Outcome: Progressing     Problem: Fall Injury Risk  Goal: Absence of Fall and Fall-Related Injury  Outcome: Progressing     Problem: Diabetes Comorbidity  Goal: Blood Glucose Level Within Desired Range  Outcome: Progressing     Problem: Hypertension Comorbidity  Goal: Blood Pressure in Desired Range  Outcome: Progressing     Problem: Infection  Goal: Infection Symptom Resolution  Outcome: Progressing

## 2018-01-26 NOTE — Unmapped (Signed)
PULMONARY CONSULT  NOTE      Patient: [name] [name](1930/04/01)  Reason for consultation: [name] [name] is a 84 y.o. female who is seen in consultation at the request of Tawnya Crook, MD for comprehensive evaluation of septic shock.    Assessment and Recommendations:      Principal Problem:    Sepsis (CMS-HCC)  Active Problems:    Diabetes mellitus, type 2 (CMS-HCC)    Metastatic lung cancer (metastasis from lung to other site), unspecified laterality (CMS-HCC)    Pressure injury of sacral region, stage 2 (CMS-HCC)    Urinary tract infection  Resolved Problems:    * No resolved hospital problems. *    Patient with resolved septic shock. Continuing IV antimicrobials.    Cortisol was normal.  Patient now off pressors after using opposite arm and following cuff pressure with lactate to confirm adequate. Suspicion is for possible abdominal source related to malignancy/ascites/translocation given polymicrobial anaerobic and gram negative sepsis.      No current critical care needs at this time so will sign off.    We appreciate the opportunity to assist in the care of this patient.  Please page [phone] with any questions.    Simonne Martinet, MD    Subjective:      History of Present Illness:  [name] [name] is a 84 y.o. female with a history of metastatic NSCLC on crizotinib and chronic bilateral malignant pleural effusions admitted for septic shock from presumed urinary source vs. Skin or soft tissue infection vs intraabdominal. Has received vancomycin and cefepime and adequate fluid resuscitation.  Now off pressors.      Patient feels well today, no complaints.  Appetite marginal today.        Review of Systems: A comprehensive review of systems was performed and was negative except as above in HPI  Past Medical History:   Diagnosis Date   ??? Atrial fibrillation (CMS-HCC)    ??? Cancer (CMS-HCC)    ??? Diabetes mellitus (CMS-HCC)    ??? Hypertension    ??? Metastatic lung cancer (metastasis from lung to other site) (CMS-HCC)    ??? Pulmonary embolism (CMS-HCC)      Past Surgical History:   Procedure Laterality Date   ??? CHG RAD GUIDED,PERCUT DRAINAGE,W/CATH PLACE Right 06/19/2017    Procedure: RADIOLOGICAL GUIDANCE, FOR PERCUTANEOUS DRAINAGE, WITH PLACEMENT OF CATHETER, RAD S&I;  Surgeon: Sherwood Gambler, MD;  Location: [facility] PROCEDURE LAB [facility];  Service: Pulmonary   ??? CHOLECYSTECTOMY     ??? HYSTERECTOMY     ??? neck surgey     ??? PR INSERTION INDWELLING TUNNELED PLEURAL CATHETER Bilateral 06/19/2017    Procedure: INSERTION OF INDWELLING TUNNELED PLEURAL CATHETER WITH CUFF WITH MODERATE SEDATION;  Surgeon: Sherwood Gambler, MD;  Location: [facility] PROCED[facility] LAB [facility];  Service: Pulmonary   ??? PR INSERTION INDWELLING TUNNELED PLEURAL CATHETER Left 10/26/2017    Procedure: INSERTION OF INDWELLING TUNNELED PLEURAL CATHETER WITH CUFF;  Surgeon: Mercy Moore, MD;  Location: MAIN OR [facility];  Service: Pulmonary   ??? PR MOD SED SAME PHYS/QHP EACH ADDL 15 MINS  06/19/2017    Procedure: MODERATE SEDATION SERVICES PROVIDED BY SAME PHYSICIAN OR OTHER QUALIFIED HEALTH CARE PROFESSIONAL PERFORMING THE DIAGNOSTIC OR THERAPEUTIC SERVICE THAT SEDATION SUPPORTS, EACH ADDITIONAL 15 MINS;  Surgeon: Sherwood Gambler, MD;  Location: [facility] PROCEDURE LAB [facility];  Service: Pulmonary   ??? PR MOD SED SAME PHYS/QHP INITIAL 15 MINS 5/> YRS  06/19/2017    Procedure: MODERATE SEDATION  SERVICES PROVIDED BY SAME PHYSICIAN OR OTHER QUALIFIED HC PROFESSIONAL PERFORMING THE DIAGNOSTIC OR THERAPEUTIC SERVICE THAT SEDATION SUPPORTS, INIT 15 MINS, PT AGE 63 YEARS OR OLDER;  Surgeon: Sherwood Gambler, MD;  Location: [facility] PROCED[facility] LAB [facility];  Service: Pulmonary     Medications reviewed in Epic  Allergies as of 01/20/2018 - Reviewed 01/20/2018   Allergen Reaction Noted   ??? Sulfasalazine Nausea Only 12/22/2014     Family History   Problem Relation Age of Onset   ??? Cancer Brother         lung   ??? Cancer Daughter         lung   ??? Cancer Daughter         breast Social History     Tobacco Use   ??? Smoking status: Never Smoker   ??? Smokeless tobacco: Never Used   Substance Use Topics   ??? Alcohol use: No        Objective:      Physical Exam:  Vitals:    01/26/18 0708 01/26/18 0710 01/26/18 0800 01/26/18 0814   BP:   139/107    Pulse: 81 84 74 72   Resp: 15 13 11 13    Temp:   36.4 ??C (97.5 ??F)    TempSrc:   Oral    SpO2: 99%   98%   Weight:       Height:         General: Alert, well-appearing, and in no distress.  Eyes: Anicteric sclera, conjunctiva clear.  ENT:  Mucous membranes moist and intact.  Lymph: No cervical or supraclavicular adenopathy.  Lungs: Normal excursion, no dullness to percussion. Good air movement bilaterally, without wheezes or crackles. Normal upper airway sounds without evidence of stridor.  Cardiovascular: Regular rate and rhythm, S1, S2 normal, no murmur, click, rub or gallop appreciated.  Abdomen: Soft, non-tender, not distended, bowel sounds are normal, liver is not enlarged, spleen is not enlarged  Musculoskeletal: No clubbing and no synovitis.  Skin: No rashes or lesions.  Neuro: No focal neurological deficits.    Malnutrition Assessment by RD:          Diagnostic Review:   All labs and images were personally reviewed.

## 2018-01-26 NOTE — Unmapped (Signed)
[name] [name] appears to be more alert and cooperative with initial assessment. Pt found to have more stable sbp on midodrine.  Pt requested alittle food this evening.  Daughter at bedside.  Pt lungs diminished but clear.  Pt has received several potassium / mag supp.  Pt is without complaints other than when moved.  Pt remains afebrile.  Plan to do bath  and dressing in am.  Will continue to follow.    Problem: Adult Inpatient Plan of Care  Goal: Plan of Care Review  Outcome: Progressing  Goal: Patient-Specific Goal (Individualization)  Outcome: Progressing  Goal: Absence of Hospital-Acquired Illness or Injury  Outcome: Progressing  Goal: Optimal Comfort and Wellbeing  Outcome: Progressing  Goal: Readiness for Transition of Care  Outcome: Progressing  Goal: Rounds/Family Conference  Outcome: Progressing     Problem: Skin Injury Risk Increased  Goal: Skin Health and Integrity  Outcome: Progressing     Problem: Fall Injury Risk  Goal: Absence of Fall and Fall-Related Injury  Outcome: Progressing     Problem: Hypertension Comorbidity  Goal: Blood Pressure in Desired Range  Outcome: Progressing     Problem: Infection  Goal: Infection Symptom Resolution  Outcome: Progressing

## 2018-01-26 NOTE — Unmapped (Signed)
Geriatrics (MDA) Progress Note    Assessment & Plan:     [name] [name] is a 84 y.o. female with a PMHx of metastatic NSCLC (Stage IV, on crizotinib) c/b b/l malignant effusions s/p bilateral pleurX catheters, hx multiple LE ulcers, AFib on elequis, here with septic shock and Klebsiella/Bacteroides bacteremia. Overall improving and remains stable off pressors this AM.    Principal Problem:    Sepsis (CMS-HCC)  Active Problems:    Diabetes mellitus, type 2 (CMS-HCC)    Metastatic lung cancer (metastasis from lung to other site), unspecified laterality (CMS-HCC)    Pressure injury of sacral region, stage 2 (CMS-HCC)    Urinary tract infection  Resolved Problems:    * No resolved hospital problems. *      Septic shock, Klebsiella and Bacteroides bacteremia  Stable, now off IV pressors and receiving only Midodrine for BP support. BCx growing Klebsiella pneumoniae and Bacteroides fragilis, suspect source is intraabdominal given metastatic cancer likely invading duodenum and likely malignant ascites. UCx growing PsA, may represent colonization. No focal fluid collection or abscess on CT AP on admission. Suspect pulmonary source unlikely, pleurX catheters draining pleural fluid freely. AM cortisol 22.2, making adrenal insufficiency unlikely. Overall doing better; will plan for 14 day total course of antibiotics through 02/05/2018. Will touch base with Pharmacy regarding possibly consolidating to PO with Flagyl and Levaquin rather than parenteral.  - Cefepime 2g q12h along with Flagyl 500mg  q8h; plan for 14 days total abx through 02/05/2018  - D/c continuous IVF today 11/29  - D/c foley today 11/29  - Midodrine 10 mg TID, although patient frequently refuses PO meds when family not at bedside    Toxic metabolic encephalopathy, delirium  Slowly improving. Waxing/waning mental status, at times agitated, better with family at bedside. Overall seems to be improving from delirium aspect, though seemed sleepy this AM 11/29.  - Delirium precautions    Urinary Retention  Had been discharged home from last hospitalization with foley in place. Successfully completed trial of void and had not been requiring I&O caths. However, concern for retention here so foley placed; will d/c today 11/29.  - Strict I/Os    Multiple Wounds (LEs and sacral decub)  Multiple wounds to BLE and significant sacral decubitus ulcer which appear to have been present for some time. No evidence of acute infection. Sacral wound suggests patient recently has not been as functional as initially suspected. Seen by [facility] 11/26 who recommend antimicrobial dressings/Dakins, nutrition optimization, possible enzymatic debridement, and offloading area of wound.  - Appreciate WOCN recommendations    Stage IV NSCLC  Complicated by bilateral malignant infusions with PleurX catheters in place and drained qOD. Known mets to liver. Recently saw Heme/Onc and noted to have stable disease. On Crizotinib alone for cancer directed therapy. Holding Crizotinib while septic which Oncology sts is reasonable in curbside consult. Will need to figure out how Crizotinib should be reordered once patient clears sepsis. Does follow with Dr. Legrand Como for Palliative Care. Has had ongoing discussions regarding goals of care; see most recent note 01/16/2018 for further details. Will continue to address goals of care while admitted; see most recent note dated 11/25 for updates. Concern in particular that should patient code she would be unlikely to have a good outcome.  - Hold Crizotinib  - Drain PleurX catheters every other day (due today 11/29)    Anemia of Chronic Disease, IDA  Recent admission in early 12/2017 during which patient required 2 units pRBCs for anemia.  Hemoglobin stable at 9.8 on admission and had been stable in the 8s after receiving fluid resuscitation. Hb 5.0 on CBC today 11/29; however no suggestion of active bleed or other etiology for sudden drop. Vital signs remain stable and overall improved. Suspect Hb/Hct this AM may be erroneous and will recheck prior to initiating further investigation/treatment. Had been on iron supplementation, but d/c'd as patient may not have been taking and concern this will further decrease appetite in patient with already limited PO intake.  - Hold iron supplement  - F/u CBC    IDDM  A1c 6.4 most recently. Home insulin Novolog 70/30 8 units BID per recent decrease. Had been borderline hypoglycemic at home. Will decrease to 5 units and switch to Glargine while admitted.  - Glargine 5 units qHS with SSI    Daily Checklist:  Diet: Regular Diet  DVT PPx: Patient Already on Full Anticoagulation with Apixaban   GI PPx: Home H2 Blocker  Electrolytes: Potassium Repleted  Code Status: Full Code, though will plan to continue discussions regarding Code Status and GOC as suspect patient would have poor outcome if she required CPR  Dispo: Continue ICU Care    Subjective:   Overall improved and now off of Levophed with stable MAPs. Patient denies any complaints this morning. Continues to have poor appetite per family.    Objective:   Temp:  [36.4 ??C-36.5 ??C] 36.4 ??C  Heart Rate:  [56-152] 72  SpO2 Pulse:  [57-73] 68  Resp:  [11-20] 13  BP: (75-156)/(32-107) 139/107  SpO2:  [96 %-100 %] 98 %    Gen: Chronically ill-appearing in NAD, alert  Heart: Irregularly irregular, normal rate, S1, S2, no M/R/G, no chest wall tenderness  Lungs: Diminished breath sounds worst in right mid-lung and base, no use of accessory muscles  Abdomen: Normoactive bowel sounds, soft, mildly distended unchanged from prior exam, no rebound/guarding  Extremities: No clubbing or cyanosis, trace BLE pitting edema L > R, multiple wounds with bandages intact    Labs/Studies: Labs and Studies from the last 24hrs per EMR and Reviewed     Everlean Cherry, MD

## 2018-01-26 NOTE — Unmapped (Signed)
Pt progressing with care.  A-Line removed. NorEpi stopped. Pt taking her medications and eating meals and drinking her ensure.      Problem: Adult Inpatient Plan of Care  Goal: Plan of Care Review  Outcome: Progressing     Problem: Adult Inpatient Plan of Care  Goal: Patient-Specific Goal (Individualization)  Outcome: Progressing     Problem: Adult Inpatient Plan of Care  Goal: Absence of Hospital-Acquired Illness or Injury  Outcome: Progressing     Problem: Adult Inpatient Plan of Care  Goal: Readiness for Transition of Care  Outcome: Progressing     Problem: Adult Inpatient Plan of Care  Goal: Rounds/Family Conference  Outcome: Progressing     Problem: Fall Injury Risk  Goal: Absence of Fall and Fall-Related Injury  Outcome: Progressing     Problem: Diabetes Comorbidity  Goal: Blood Glucose Level Within Desired Range  Outcome: Progressing     Problem: Hypertension Comorbidity  Goal: Blood Pressure in Desired Range  Outcome: Progressing     Problem: Infection  Goal: Infection Symptom Resolution  Outcome: Progressing

## 2018-01-27 DIAGNOSIS — A419 Sepsis, unspecified organism: Principal | ICD-10-CM

## 2018-01-27 LAB — CBC
HEMATOCRIT: 21.8 % — ABNORMAL LOW (ref 36.0–46.0)
HEMOGLOBIN: 7.6 g/dL — ABNORMAL LOW (ref 13.5–16.0)
MEAN CORPUSCULAR HEMOGLOBIN CONC: 34.8 g/dL (ref 31.0–37.0)
MEAN CORPUSCULAR HEMOGLOBIN: 26.1 pg (ref 26.0–34.0)
MEAN CORPUSCULAR VOLUME: 75 fL — ABNORMAL LOW (ref 80.0–100.0)
MEAN PLATELET VOLUME: 7.7 fL (ref 7.0–10.0)
PLATELET COUNT: 208 10*9/L (ref 150–440)
RED BLOOD CELL COUNT: 2.9 10*12/L — ABNORMAL LOW (ref 4.00–5.20)
RED CELL DISTRIBUTION WIDTH: 24.1 % — ABNORMAL HIGH (ref 12.0–15.0)

## 2018-01-27 LAB — BASIC METABOLIC PANEL
ANION GAP: <1 mmol/L — ABNORMAL LOW (ref 7–15)
BLOOD UREA NITROGEN: 22 mg/dL — ABNORMAL HIGH (ref 7–21)
BUN / CREAT RATIO: 37
CHLORIDE: 111 mmol/L — ABNORMAL HIGH (ref 98–107)
CO2: 27 mmol/L (ref 22.0–30.0)
CREATININE: 0.59 mg/dL — ABNORMAL LOW (ref 0.60–1.00)
EGFR CKD-EPI NON-AA FEMALE: 84 mL/min/{1.73_m2} (ref >=60)
GLUCOSE RANDOM: 177 mg/dL (ref 65–179)
POTASSIUM: 3.7 mmol/L (ref 3.5–5.0)
SODIUM: 135 mmol/L (ref 135–145)

## 2018-01-27 LAB — CALCIUM: Calcium:MCnc:Pt:Ser/Plas:Qn:: 8.4 — ABNORMAL LOW

## 2018-01-27 LAB — WBC ADJUSTED: Lab: 4.6

## 2018-01-27 NOTE — Unmapped (Signed)
Geriatrics (MDA) Progress Note    Assessment & Plan:     [name] [name] is a 84 y.o. female with a PMHx of metastatic NSCLC (Stage IV, on crizotinib) c/b b/l malignant effusions s/p bilateral pleurX catheters, hx multiple LE ulcers, AFib on elequis, here with septic shock and Klebsiella/Bacteroides bacteremia. Overall improving and remains stable off pressors this AM.    Principal Problem:    Sepsis (CMS-HCC)  Active Problems:    Diabetes mellitus, type 2 (CMS-HCC)    Metastatic lung cancer (metastasis from lung to other site), unspecified laterality (CMS-HCC)    Pressure injury of sacral region, stage 2 (CMS-HCC)    Urinary tract infection  Resolved Problems:    * No resolved hospital problems. *      Septic shock, Klebsiella and Bacteroides bacteremia  Stable, now off IV pressors and Midodrine. BCx growing Klebsiella pneumoniae and Bacteroides fragilis, suspect source is intraabdominal given metastatic cancer likely invading duodenum and likely malignant ascites. UCx growing PsA, may represent colonization. No focal fluid collection or abscess on CT AP on admission. Suspect pulmonary source unlikely, pleurX catheters draining pleural fluid freely. AM cortisol 22.2, making adrenal insufficiency unlikely. Overall doing better; will plan for 14 day total course of antibiotics through 02/05/2018. Will likely need parenteral abx for the duration of therapy.  - Cefepime 2g q12h along with Flagyl 500mg  q8h; plan for 14 days total abx through 02/05/2018  - D/c [facility] CVL today    Toxic metabolic encephalopathy, delirium  Slowly improving. Waxing/waning mental status, at times agitated, better with family at bedside. Overall seems to be improving from delirium aspect.  - Delirium precautions    Urinary Retention  Had been discharged home from last hospitalization with foley in place. Successfully completed trial of void and had not been requiring I&O caths. However, concern for retention here so foley placed. Has required I&O cath since discontinuing foley yesterday. Will see how patient does today before considering replacing indwelling foley.  - I&O cath PRN; consider replacing indwelling foley if continues to be unable to void spontaneously    Multiple Wounds (LEs and sacral decub)  Multiple wounds to BLE and significant sacral decubitus ulcer which appear to have been present for some time. No evidence of acute infection. Sacral wound suggests patient recently has not been as functional as initially suspected. Seen by [facility] 11/26 who recommend antimicrobial dressings/Dakins, nutrition optimization, possible enzymatic debridement, and offloading area of wound.  - Appreciate WOCN recommendations    Stage IV NSCLC  Complicated by bilateral malignant infusions with PleurX catheters in place and drained qOD. Known mets to liver. Recently saw Heme/Onc and noted to have stable disease. On Crizotinib alone for cancer directed therapy. Holding Crizotinib while septic which Oncology sts is reasonable in curbside consult. Will need to figure out how Crizotinib should be reordered once patient clears sepsis. Does follow with Dr. Legrand Como for Palliative Care. Has had ongoing discussions regarding goals of care; see most recent note 01/16/2018 for further details. Will continue to address goals of care while admitted; see most recent note dated 11/25 for updates. Concern in particular that should patient code she would be unlikely to have a good outcome. Of note had further discussion with daughter 11/30 who believes her brother [name] [name] [facility]) is somewhat in denial about their mother's overall condition and prognosis.  - Hold Crizotinib  - Drain PleurX catheters every other day (next due 12/1)    Anemia of Chronic Disease, IDA  Recent admission in  early 12/2017 during which patient required 2 units pRBCs for anemia. Hemoglobin stable at 9.8 on admission and had been stable in the 8s after receiving fluid resuscitation. Initial Hb down at 5.0 11/29, but suspect this was spurious as recheck shortly after with Hb stable at 7.6. Had been on iron supplementation, but d/c'd as patient may not have been taking and concern this will further decrease appetite in patient with already limited PO intake.  - Hold iron supplement  - Daily CBC    IDDM  A1c 6.4 most recently. Home insulin Novolog 70/30 8 units BID per recent decrease. Had been borderline hypoglycemic at home. Will decrease to 5 units and switch to Glargine while admitted.  - Glargine 5 units qHS with SSI    Daily Checklist:  Diet: Regular Diet  DVT PPx: Patient Already on Full Anticoagulation with Apixaban   GI PPx: Home H2 Blocker  Electrolytes: Potassium Repleted  Code Status: Full Code, though will plan to continue discussions regarding Code Status and GOC as suspect patient would have poor outcome if she required CPR  Dispo: Continue ICU Care    Subjective:   Continues to slowly improve; maintaining pressure off Levophed. Continues to deny any complaints this morning. Continues to have poor appetite per family. Also unable to void spontaneously per family requiring I&O caths. Has yet to have BM while admitted.    Objective:   Temp:  [36.4 ??C-36.5 ??C] 36.5 ??C  Heart Rate:  [66-100] 90  Resp:  [8-18] 13  BP: (113-151)/(53-109) 124/60  SpO2:  [92 %-100 %] 95 %    Gen: Chronically ill-appearing in NAD, alert  Heart: Irregularly irregular, normal rate, S1, S2, no M/R/G, no chest wall tenderness  Lungs: Mildly diminished breath sounds worst in right mid-lung and base, no use of accessory muscles  Abdomen: Normoactive bowel sounds, soft, mildly distended unchanged from prior exam, no rebound/guarding  Extremities: No clubbing or cyanosis, trace BLE pitting edema L > R, multiple wounds with bandages intact    Labs/Studies: Labs and Studies from the last 24hrs per EMR and Reviewed     Everlean Cherry, MD

## 2018-01-28 LAB — CBC
MEAN CORPUSCULAR HEMOGLOBIN CONC: 34.7 g/dL (ref 31.0–37.0)
MEAN CORPUSCULAR VOLUME: 75.1 fL — ABNORMAL LOW (ref 80.0–100.0)
MEAN PLATELET VOLUME: 7.5 fL (ref 7.0–10.0)
PLATELET COUNT: 188 10*9/L (ref 150–440)
RED BLOOD CELL COUNT: 2.71 10*12/L — ABNORMAL LOW (ref 4.00–5.20)
RED CELL DISTRIBUTION WIDTH: 24.3 % — ABNORMAL HIGH (ref 12.0–15.0)
WBC ADJUSTED: 4.4 10*9/L — ABNORMAL LOW (ref 4.5–11.0)

## 2018-01-28 LAB — BASIC METABOLIC PANEL
ANION GAP: <1 mmol/L — ABNORMAL LOW (ref 7–15)
BLOOD UREA NITROGEN: 22 mg/dL — ABNORMAL HIGH (ref 7–21)
BUN / CREAT RATIO: 34
CHLORIDE: 112 mmol/L — ABNORMAL HIGH (ref 98–107)
CO2: 26 mmol/L (ref 22.0–30.0)
CREATININE: 0.64 mg/dL (ref 0.60–1.00)
EGFR CKD-EPI NON-AA FEMALE: 82 mL/min/{1.73_m2} (ref >=60)
GLUCOSE RANDOM: 157 mg/dL (ref 65–179)
POTASSIUM: 3.2 mmol/L — ABNORMAL LOW (ref 3.5–5.0)
SODIUM: 135 mmol/L (ref 135–145)

## 2018-01-28 LAB — EGFR CKD-EPI AA FEMALE: Lab: >90

## 2018-01-28 LAB — MEAN CORPUSCULAR HEMOGLOBIN: Lab: 26.1

## 2018-01-28 NOTE — Unmapped (Signed)
Geriatrics (MDA) Progress Note    Assessment & Plan:     [name] [name] is a 84 y.o. female with a PMHx of metastatic NSCLC (Stage IV, on crizotinib) c/b b/l malignant effusions s/p bilateral pleurX catheters, hx multiple LE ulcers, AFib on elequis, here with septic shock and Klebsiella/Bacteroides bacteremia. Overall improving; floor status and anticipate discharge within the next few days.    Principal Problem:    Sepsis (CMS-HCC)  Active Problems:    Diabetes mellitus, type 2 (CMS-HCC)    Metastatic lung cancer (metastasis from lung to other site), unspecified laterality (CMS-HCC)    Pressure injury of sacral region, stage 2 (CMS-HCC)    Urinary tract infection  Resolved Problems:    * No resolved hospital problems. *      Septic shock, Klebsiella and Bacteroides bacteremia  Stable, now off IV pressors and Midodrine. BCx growing Klebsiella pneumoniae and Bacteroides fragilis, suspect source is intraabdominal given metastatic cancer likely invading duodenum and likely malignant ascites. UCx growing PsA, may represent colonization. No focal fluid collection or abscess on CT AP on admission. Suspect pulmonary source unlikely, pleurX catheters draining pleural fluid freely. AM cortisol 22.2, making adrenal insufficiency unlikely. Overall doing better; will plan for 14 day total course of antibiotics through 02/05/2018. ID consult this AM who report we can switch to PO Levaquin/Flagyl to complete 2 week course of antibiotics through 02/02/2018.  - Levaquin 500mg  daily (will check EKG given borderline QTc in past)  - Flagyl 500mg  TID  - D/c LIJ CVL today    Delirium  Gradually improving. Waxing/waning mental status, at times agitated, better with family at bedside. Overall seems to be significantly improving from delirium aspect.  - Delirium precautions    Urinary Retention  Had been discharged home from last hospitalization with foley in place. Successfully completed trial of void and had not been requiring I&O caths. However, concern for retention here so foley placed. Evidence of further retention after d/c'ing foley so foley replaced and will likely need to leave in place until patient is seen in follow up.  -Follow up regarding foley/urinary retention as outpatient    Multiple Wounds (LEs and sacral decub)  Multiple wounds to BLE and significant sacral decubitus ulcer which appear to have been present for some time. No evidence of acute infection. Sacral wound suggests patient recently has not been as functional as initially suspected. Seen by [facility] 11/26 who recommend antimicrobial dressings/Dakins, nutrition optimization, possible enzymatic debridement, and offloading area of wound.  - Appreciate WOCN recommendations    Stage IV NSCLC  Complicated by bilateral malignant infusions with PleurX catheters in place and drained qOD. Known mets to liver. Recently saw Heme/Onc and noted to have stable disease. On Crizotinib alone for cancer directed therapy. Holding Crizotinib while septic which Oncology sts is reasonable in curbside consult. Will need to figure out how Crizotinib should be reordered once patient clears sepsis. Does follow with Dr. Legrand Como for Palliative Care. Has had ongoing discussions regarding goals of care; see most recent note 01/16/2018 for further details. Will continue to address goals of care while admitted; see most recent note dated 11/25 for updates. Concern in particular that should patient code she would be unlikely to have a good outcome. Of note had further discussion with daughter 11/30 who believes her brother [name] [facility]) is somewhat in denial about their mother's overall condition and prognosis.  - Hold Crizotinib  - Drain PleurX catheters every other day (next due 12/1)  Anemia of Chronic Disease, IDA  Recent admission in early 12/2017 during which patient required 2 units pRBCs for anemia. Hemoglobin stable at 9.8 on admission and had been stable in the 8s after receiving fluid resuscitation. Initial Hb down at 5.0 11/29, but suspect this was spurious as recheck shortly after with Hb stable at 7.6. Had been on iron supplementation, but d/c'd as patient may not have been taking and concern this will further decrease appetite in patient with already limited PO intake.  - Hold iron supplement  - Daily CBC    IDDM  A1c 6.4 most recently. Home insulin Novolog 70/30 8 units BID per recent decrease. Had been borderline hypoglycemic at home. Will decrease to 5 units and switch to Glargine while admitted.  - Glargine 5 units qHS with SSI    Daily Checklist:  Diet: Regular Diet  DVT PPx: Patient Already on Full Anticoagulation with Apixaban   GI PPx: Home H2 Blocker  Electrolytes: Potassium Repleted  Code Status: Full Code, though will plan to continue discussions regarding Code Status and GOC as suspect patient would have poor outcome if she required CPR  Dispo: Continue ICU Care    Subjective:   No overnight events. Much improved and feel that patient is nearing discharge. Patient denies any complaints at this time and appears to have eaten most of her breakfast today. Seems to be eating decently for one meal daily and taking fluids throughout the day otherwise. Discussed discharge plan with family who remain resolute that they have enough support at home to care for the patient, though are interested in hoyer lift at least if not hospital bed or other DME.    Objective:   Temp:  [36.2 ??C-37 ??C] 36.2 ??C  Heart Rate:  [89-100] 89  Resp:  [13-19] 19  BP: (115-140)/(59-90) 134/59  SpO2:  [95 %-96 %] 96 %    Gen: Chronically ill-appearing in NAD, alert  Heart: Irregularly irregular, normal rate, S1, S2, no M/R/G, no chest wall tenderness  Lungs: Mildly diminished breath sounds, no use of accessory muscles  Abdomen: Normoactive bowel sounds, soft, mildly distended unchanged from prior exam, no rebound/guarding  Extremities: No clubbing or cyanosis, trace BLE pitting edema L > R, multiple wounds with bandages intact    Labs/Studies: Labs and Studies from the last 24hrs per EMR and Reviewed     Everlean Cherry, MD

## 2018-01-28 NOTE — Unmapped (Signed)
Pt had uneventful night. VSS. Afebrile. Denies any pain pain. Pt is quiet. Slow to response. Family at the bed site at all times. Turned every two hour and prn. Pt remains stable, cont to monitor. See flowhseet for full assessment.     Problem: Adult Inpatient Plan of Care  Goal: Plan of Care Review  Outcome: Progressing  Goal: Patient-Specific Goal (Individualization)  Outcome: Progressing  Goal: Absence of Hospital-Acquired Illness or Injury  Outcome: Progressing  Goal: Optimal Comfort and Wellbeing  Outcome: Progressing  Goal: Readiness for Transition of Care  Outcome: Progressing  Goal: Rounds/Family Conference  Outcome: Progressing     Problem: Skin Injury Risk Increased  Goal: Skin Health and Integrity  Outcome: Progressing     Problem: Self-Care Deficit  Goal: Improved Ability to Complete Activities of Daily Living  Outcome: Progressing     Problem: Fall Injury Risk  Goal: Absence of Fall and Fall-Related Injury  Outcome: Progressing     Problem: Diabetes Comorbidity  Goal: Blood Glucose Level Within Desired Range  Outcome: Progressing     Problem: Hypertension Comorbidity  Goal: Blood Pressure in Desired Range  Outcome: Progressing     Problem: Infection  Goal: Infection Symptom Resolution  Outcome: Progressing

## 2018-01-28 NOTE — Unmapped (Addendum)
Patient has VVS, Floor status bed now, Pleuro drains will be drained tomorrow. Replaced foley today,patient unable to urinate and bladder scan showed over 500, 100 ml output. Pain medication and interventions were effective. Patient turned every 2 hours, wound dressing change completed (1800) with no change in wound condition. Will continue to   Problem: Adult Inpatient Plan of Care  Goal: Plan of Care Review  Outcome: Ongoing - Unchanged  Goal: Patient-Specific Goal (Individualization)  Outcome: Ongoing - Unchanged  Goal: Absence of Hospital-Acquired Illness or Injury  Outcome: Ongoing - Unchanged  Goal: Optimal Comfort and Wellbeing  Outcome: Ongoing - Unchanged  Goal: Readiness for Transition of Care  Outcome: Ongoing - Unchanged  Goal: Rounds/Family Conference  Outcome: Ongoing - Unchanged     Problem: Skin Injury Risk Increased  Goal: Skin Health and Integrity  Outcome: Ongoing - Unchanged     Problem: Self-Care Deficit  Goal: Improved Ability to Complete Activities of Daily Living  Outcome: Ongoing - Unchanged     Problem: Fall Injury Risk  Goal: Absence of Fall and Fall-Related Injury  Outcome: Ongoing - Unchanged     Problem: Diabetes Comorbidity  Goal: Blood Glucose Level Within Desired Range  Outcome: Ongoing - Unchanged     Problem: Hypertension Comorbidity  Goal: Blood Pressure in Desired Range  Outcome: Ongoing - Unchanged     Problem: Infection  Goal: Infection Symptom Resolution  Outcome: Ongoing - Unchanged   monitor.

## 2018-01-28 NOTE — Unmapped (Signed)
GENERAL INFECTIOUS DISEASES TEAM B CONSULT NOTE      [name] [name] is a 84 y.o. female  being seen in consultation at the request of Tawnya Crook, MD for evaluation of bacteremia/sepsis      Assessment:    This is an 84 year old female with history of metastatic non-small cell lung carcinoma (on crizotinib) (bilateral metastatic pleural effusions with Pleurx catheters, mets to the brain and the liver) admitted on November 23 with septic shock in the setting of K.pneumoniae and Bacteroides fragilis bacteremia.  She is clinically improved at this point.  The exact source of the bacteremia has not been identified but it is likely of GI source related to the intra-abdominal tumor(s).  Urine culture grew Pseudomonas.  She is a chronic indwelling catheter and it is hard to be sure whether the Pseudomonas was causing a symptomatic infection but probably not, given that the blood cultures grew other organisms.  She also has a sacral decubitus ulcer which does not appear acutely infected.  Unlikely that the bacteremia would be explained by a Pleurx related infection.  At this point she is clinically improved and hemodynamically stable.  I think that it is reasonable to change antibiotics to oral levofloxacin and metronidazole to complete total of 2 weeks since admission (11/23-12/6)    ID Problem List:  1. Septic shock in the setting of K.pneumoniae and B.fragilis bacteremia  2. Positive urine cx for Pseudomonas aeruginosa  3. metastatic non-small cell lung carcinoma (on crizotinib) (bilateral metastatic pleural effusions with Pleurx catheters, mets to the brain and the liver)       Recommendations:  1. I would change current abx to oral Levofloxacin and Flagyl to complete total of 2 weeks since 11/23 (11/23-12/6)  2. ID will sign off. Please call if questions.      Thank you for involving [place] in the care of this patient. Please call the General B ID pager with questions at [phone].     Colman Cater Jemila Camille MD  [facility]        History of Present Illness:      Ms. [name] [name] is an 84 year old female with history of metastatic non-small cell lung carcinoma (on crizotinib) (bilateral metastatic pleural effusions with Pleurx catheters, mets to the brain and the liver), DM, afib, recent admission for LLE cellulitis, hx of intraabdominal gist tumor, sacral decub ulcer, admitted on November 23 with septic shock in the setting of K.pneumoniae and Bacteroides fragilis bacteremia. The pt had had weakness and decreased appetite for 3 days prior to admission and fever for 1 day. On admission she also had lactic acidosis. She was given broad abx and also required pressors for a few days.  Urine culture grew Pseudomonas (She is a chronic indwelling catheter). No flank pain. No abdominal pain. No N/V/D. No chest pain. She improved clinically and at this point she is off pressors.  She is still feeling weak, but overall feels better compared to admission.    Source of information includes:  Review of medical records, discussion with patient, discussion with treating providers.      Allergies:  Sulfasalazine      Medications:   Current antimicrobials:  Cefepime  flagyl    Other medications reviewed.       Medical History:  Past Medical History:   Diagnosis Date   ??? Atrial fibrillation (CMS-HCC)    ??? Cancer (CMS-HCC)    ??? Diabetes mellitus (CMS-HCC)    ??? Hypertension    ??? Metastatic  lung cancer (metastasis from lung to other site) (CMS-HCC)    ??? Pulmonary embolism (CMS-HCC)        Surgical History:  Past Surgical History:   Procedure Laterality Date   ??? CHG RAD GUIDED,PERCUT DRAINAGE,W/CATH PLACE Right 06/19/2017    Procedure: RADIOLOGICAL GUIDANCE, FOR PERCUTANEOUS DRAINAGE, WITH PLACEMENT OF CATHETER, RAD S&I;  Surgeon: Sherwood Gambler, MD;  Location: [facility];  Service: Pulmonary   ??? CHOLECYSTECTOMY     ??? HYSTERECTOMY     ??? neck surgey     ??? PR INSERTION INDWELLING TUNNELED PLEURAL CATHETER Bilateral 06/19/2017    Procedure: INSERTION OF INDWELLING TUNNELED PLEURAL CATHETER WITH CUFF WITH MODERATE SEDATION;  Surgeon: Sherwood Gambler, MD;  Location: [facility];  Service: Pulmonary   ??? PR INSERTION INDWELLING TUNNELED PLEURAL CATHETER Left 10/26/2017    Procedure: INSERTION OF INDWELLING TUNNELED PLEURAL CATHETER WITH CUFF;  Surgeon: Mercy Moore, MD;  Location: [facility] OR [facility];  Service: Pulmonary   ??? PR MOD SED SAME PHYS/QHP EACH ADDL 15 MINS  06/19/2017    Procedure: MODERATE SEDATION SERVICES PROVIDED BY SAME PHYSICIAN OR OTHER QUALIFIED HEALTH CARE PROFESSIONAL PERFORMING THE DIAGNOSTIC OR THERAPEUTIC SERVICE THAT SEDATION SUPPORTS, EACH ADDITIONAL 15 MINS;  Surgeon: Sherwood Gambler, MD;  Location: [facility];  Service: Pulmonary   ??? PR MOD SED SAME PHYS/QHP INITIAL 15 MINS 5/> YRS  06/19/2017    Procedure: MODERATE SEDATION SERVICES PROVIDED BY SAME PHYSICIAN OR OTHER QUALIFIED HC PROFESSIONAL PERFORMING THE DIAGNOSTIC OR THERAPEUTIC SERVICE THAT SEDATION SUPPORTS, INIT 15 MINS, PT AGE 75 YEARS OR OLDER;  Surgeon: Sherwood Gambler, MD;  Location: [facility];  Service: Pulmonary       Social History:  Social History     Socioeconomic History   ??? Marital status: Widowed     Spouse name: Not on file   ??? Number of children: Not on file   ??? Years of education: Not on file   ??? Highest education level: Not on file   Occupational History   ??? Not on file   Social Needs   ??? Financial resource strain: Not on file   ??? Food insecurity:     Worry: Not on file     Inability: Not on file   ??? Transportation needs:     Medical: Not on file     Non-medical: Not on file   Tobacco Use   ??? Smoking status: Never Smoker   ??? Smokeless tobacco: Never Used   Substance and Sexual Activity   ??? Alcohol use: No   ??? Drug use: Never   ??? Sexual activity: Not on file   Lifestyle   ??? Physical activity:     Days per week: Not on file     Minutes per session: Not on file   ??? Stress: Not on file   Relationships   ??? Social connections:     Talks on phone: Not on file     Gets together: Not on file     Attends religious service: Not on file     Active member of club or organization: Not on file     Attends meetings of clubs or organizations: Not on file     Relationship status: Not on file   Other Topics Concern   ??? Do you use sunscreen? No   ??? Tanning bed use? No   ??? Are you easily burned? No   ???  Excessive sun exposure? No   ??? Blistering sunburns? No   Social History Narrative   ??? Not on file       Family History:  Family History   Problem Relation Age of Onset   ??? Cancer Brother         lung   ??? Cancer Daughter         lung   ??? Cancer Daughter         breast       Review of Systems:  12 systems reviewed and negative except as per HPI.       Objective:   Vital signs:  Temp:  [36.2 ??C (97.2 ??F)-37 ??C (98.6 ??F)] 36.2 ??C (97.2 ??F)  Heart Rate:  [89-100] 89  Resp:  [13-19] 19  BP: (115-140)/(59-90) 134/59  MAP (mmHg):  [87] 87  SpO2:  [95 %-96 %] 96 %    Physical exam   General: in no acute distress, chronically ill appearing  HEENT: anicteric conjunctivae, moist mucous membranes, no oral lesions  LUNGS: normal work of breathing, decreasing breathing sounds in bases. Bil pleurX caths  HEART: irregular rhythm, no murmur  ABDOMINAL: normal bowel sounds, soft, no tenderness  MSK: anasacra  SKIN: no rash. Sacral decubitus ulcer (pictures reviewed)  GU: foley catheter  NEUROLOGICAL: alert and oriented x3  PSYCH: interactive  LINES: appear non-infected      Labs:    Lab Results   Component Value Date    WBC 4.4 (L) 01/28/2018    NEUTROABS 4.3 01/26/2018    LYMPHSABS  01/20/2018      Comment:      Myeloperoxidase Deficiency Noted   This is a corrected result. Previous result was 1.1 10*9/L on 01/20/2018 at 2318 EST    EOSABS  01/20/2018      Comment:      Myeloperoxidase Deficiency Noted   This is a corrected result. Previous result was 0.0 10*9/L on 01/20/2018 at 2318 EST    HGB 7.1 (L) 01/28/2018    HCT 20.3 (L) 01/28/2018    PLT 188 01/28/2018       Lab Results   Component Value Date    NA 135 01/28/2018    K 3.2 (L) 01/28/2018    CL 112 (H) 01/28/2018    CO2 26.0 01/28/2018    BUN 22 (H) 01/28/2018    CREATININE 0.64 01/28/2018    CALCIUM 8.4 (L) 01/28/2018    MG 2.0 01/25/2018       Lab Results   Component Value Date    ALKPHOS 124 01/20/2018    BILITOT 0.4 01/20/2018    BILIDIR <0.10 01/01/2018    PROT 5.2 (L) 01/20/2018    ALBUMIN 2.0 (L) 01/20/2018    ALT 22 01/20/2018    AST 24 01/20/2018       Lab Results   Component Value Date    ESR 64 (H) 01/25/2018    CRP 83.2 (H) 01/25/2018       Microbiology:    Blood cultures:   11/23: B.fragilis and K.pneumoniae    Urine cultures:   11/23: P.aeruginosa      Studies:   Xr Chest Portable    Result Date: 01/22/2018  EXAM: XR CHEST PORTABLE DATE: 01/22/2018 ACCESSION: [id number] UN DICTATED: 01/22/2018 7:10 PM INTERPRETATION LOCATION: [facility]     CLINICAL INDICATION: 84 years old Female with CATHETER VASCULAR FIT&ADJ      COMPARISON: CT abdomen/pelvis 01/21/2018. Chest radiograph 01/20/2018.     TECHNIQUE:  Portable Chest Radiograph.     CONCLUSIONS:     Left IJ central venous catheter with tip in the right atrium. Right-sided chest tube with tip projecting over the peripheral right upper lobe. Additional catheter is coiled over the left lung base/left upper quadrant with tip projecting over the mediastinum.     Low lung volumes. Mild pulmonary edema.     Left lower lobe/retrocardiac consolidation may represent combination of infection and atelectasis.     Small left pleural effusion. No pneumothorax.     Stable cardiomediastinal silhouette.         Xr Abdomen Portable    Result Date: 01/27/2018  EXAM: XR ABDOMEN PORTABLE DATE: 01/27/2018 9:57 AM ACCESSION: [id number] UN DICTATED: 01/27/2018 11:47 AM INTERPRETATION LOCATION: [facility]     CLINICAL INDICATION: 84 years old Female with CONSTIPATION      COMPARISON: CT abdomen pelvis 01/21/2018.     TECHNIQUE: Supine view of the abdomen.     FINDINGS: Gaseous distention of the stomach. Nonobstructive bowel gas pattern. Moderate colonic stool burden. Multilevel degenerative changes of the lumbar spine. Sclerotic bone lesions in the lower lumbar spine left iliac and ischium and multiple displaced left rib fractures are better evaluated evaluated on CT 01/21/2018. Left basilar opacity and small left pleural effusion. Intravenous catheter tip is partially visualized projecting over the right atrium.         Nonobstructive bowel gas pattern. Moderate colonic stool burden.

## 2018-01-29 DIAGNOSIS — I4891 Unspecified atrial fibrillation: Secondary | ICD-10-CM

## 2018-01-29 DIAGNOSIS — Z86711 Personal history of pulmonary embolism: Secondary | ICD-10-CM

## 2018-01-29 DIAGNOSIS — I10 Essential (primary) hypertension: Secondary | ICD-10-CM

## 2018-01-29 DIAGNOSIS — E1165 Type 2 diabetes mellitus with hyperglycemia: Secondary | ICD-10-CM

## 2018-01-29 DIAGNOSIS — A419 Sepsis, unspecified organism: Principal | ICD-10-CM

## 2018-01-29 DIAGNOSIS — C787 Secondary malignant neoplasm of liver and intrahepatic bile duct: Secondary | ICD-10-CM

## 2018-01-29 DIAGNOSIS — Z466 Encounter for fitting and adjustment of urinary device: Secondary | ICD-10-CM

## 2018-01-29 DIAGNOSIS — C7951 Secondary malignant neoplasm of bone: Secondary | ICD-10-CM

## 2018-01-29 DIAGNOSIS — L97322 Non-pressure chronic ulcer of left ankle with fat layer exposed: Secondary | ICD-10-CM

## 2018-01-29 DIAGNOSIS — Z7901 Long term (current) use of anticoagulants: Secondary | ICD-10-CM

## 2018-01-29 DIAGNOSIS — Z794 Long term (current) use of insulin: Secondary | ICD-10-CM

## 2018-01-29 DIAGNOSIS — I959 Hypotension, unspecified: Secondary | ICD-10-CM

## 2018-01-29 DIAGNOSIS — J910 Malignant pleural effusion: Secondary | ICD-10-CM

## 2018-01-29 DIAGNOSIS — Z9181 History of falling: Secondary | ICD-10-CM

## 2018-01-29 DIAGNOSIS — D510 Vitamin B12 deficiency anemia due to intrinsic factor deficiency: Secondary | ICD-10-CM

## 2018-01-29 DIAGNOSIS — C3412 Malignant neoplasm of upper lobe, left bronchus or lung: Principal | ICD-10-CM

## 2018-01-29 DIAGNOSIS — C7931 Secondary malignant neoplasm of brain: Secondary | ICD-10-CM

## 2018-01-29 DIAGNOSIS — M5135 Other intervertebral disc degeneration, thoracolumbar region: Secondary | ICD-10-CM

## 2018-01-29 DIAGNOSIS — Z87440 Personal history of urinary (tract) infections: Secondary | ICD-10-CM

## 2018-01-29 DIAGNOSIS — Z4801 Encounter for change or removal of surgical wound dressing: Secondary | ICD-10-CM

## 2018-01-29 DIAGNOSIS — R339 Retention of urine, unspecified: Secondary | ICD-10-CM

## 2018-01-29 DIAGNOSIS — L89150 Pressure ulcer of sacral region, unstageable: Secondary | ICD-10-CM

## 2018-01-29 DIAGNOSIS — D649 Anemia, unspecified: Secondary | ICD-10-CM | POA: Diagnosis not present

## 2018-01-29 DIAGNOSIS — R531 Weakness: Secondary | ICD-10-CM | POA: Diagnosis not present

## 2018-01-29 DIAGNOSIS — R0602 Shortness of breath: Secondary | ICD-10-CM | POA: Diagnosis not present

## 2018-01-29 DIAGNOSIS — R000 Tachycardia, unspecified: Secondary | ICD-10-CM | POA: Diagnosis not present

## 2018-01-29 DIAGNOSIS — R0902 Hypoxemia: Secondary | ICD-10-CM | POA: Diagnosis not present

## 2018-01-29 LAB — HEMOGLOBIN: Lab: 8.6 — ABNORMAL LOW

## 2018-01-29 LAB — COMPREHENSIVE METABOLIC PANEL
ALBUMIN: 1.8 g/dL — ABNORMAL LOW (ref 3.5–5.0)
ALBUMIN: 1.5 g/dL — ABNORMAL LOW (ref 3.5–5.0)
ALKALINE PHOSPHATASE: 210 U/L — ABNORMAL HIGH (ref 38–126)
ALKALINE PHOSPHATASE: 180 U/L — ABNORMAL HIGH (ref 38–126)
ALT (SGPT): 29 U/L (ref <35)
ALT (SGPT): 26 U/L (ref <35)
ANION GAP: <1 mmol/L — ABNORMAL LOW (ref 7–15)
ANION GAP: <1 mmol/L — ABNORMAL LOW (ref 7–15)
AST (SGOT): 148 U/L — ABNORMAL HIGH (ref 14–38)
AST (SGOT): 62 U/L — ABNORMAL HIGH (ref 14–38)
BILIRUBIN TOTAL: 0.2 mg/dL (ref 0.0–1.2)
BLOOD UREA NITROGEN: 25 mg/dL — ABNORMAL HIGH (ref 7–21)
BUN / CREAT RATIO: 34
BUN / CREAT RATIO: 36
CALCIUM: 8.4 mg/dL — ABNORMAL LOW (ref 8.5–10.2)
CHLORIDE: 113 mmol/L — ABNORMAL HIGH (ref 98–107)
CHLORIDE: 113 mmol/L — ABNORMAL HIGH (ref 98–107)
CO2: 22 mmol/L (ref 22.0–30.0)
CO2: 23 mmol/L (ref 22.0–30.0)
CREATININE: 0.74 mg/dL (ref 0.60–1.00)
CREATININE: 0.73 mg/dL (ref 0.60–1.00)
EGFR CKD-EPI AA FEMALE: 85 mL/min/{1.73_m2} (ref >=60)
EGFR CKD-EPI AA FEMALE: 87 mL/min/{1.73_m2} (ref >=60)
EGFR CKD-EPI NON-AA FEMALE: 74 mL/min/{1.73_m2} (ref >=60)
EGFR CKD-EPI NON-AA FEMALE: 75 mL/min/{1.73_m2} (ref >=60)
GLUCOSE RANDOM: 214 mg/dL — ABNORMAL HIGH (ref 65–179)
POTASSIUM: 3.5 mmol/L (ref 3.5–5.0)
POTASSIUM: 3.5 mmol/L (ref 3.5–5.0)
PROTEIN TOTAL: 4.7 g/dL — ABNORMAL LOW (ref 6.5–8.3)
PROTEIN TOTAL: 3.9 g/dL — ABNORMAL LOW (ref 6.5–8.3)
SODIUM: 135 mmol/L (ref 135–145)
SODIUM: 135 mmol/L (ref 135–145)

## 2018-01-29 LAB — MEAN PLATELET VOLUME: Lab: 7.2

## 2018-01-29 LAB — BLOOD GAS CRITICAL CARE PANEL, VENOUS
CALCIUM IONIZED VENOUS (MG/DL): 5.29 mg/dL (ref 4.40–5.40)
GLUCOSE WHOLE BLOOD: 186 mg/dL
HCO3 VENOUS: 22 mmol/L (ref 22–27)
HEMOGLOBIN BLOOD GAS: 14 g/dL (ref 12.00–16.00)
O2 SATURATION VENOUS: 27 % — ABNORMAL LOW (ref 40.0–85.0)
PCO2 VENOUS: 50 mmHg (ref 40–60)
PH VENOUS: 7.31 — ABNORMAL LOW (ref 7.32–7.43)
PO2 VENOUS: 24 mmHg — ABNORMAL LOW (ref 30–55)
POTASSIUM WHOLE BLOOD: 3.4 mmol/L (ref 3.4–4.6)
SODIUM WHOLE BLOOD: 139 mmol/L (ref 135–145)

## 2018-01-29 LAB — CBC W/ AUTO DIFF
BASOPHILS ABSOLUTE COUNT: 0.1 10*9/L (ref 0.0–0.1)
BASOPHILS RELATIVE PERCENT: 0.5 %
EOSINOPHILS ABSOLUTE COUNT: 0 10*9/L (ref 0.0–0.4)
EOSINOPHILS RELATIVE PERCENT: 0 %
HEMATOCRIT: 25.5 % — ABNORMAL LOW (ref 36.0–46.0)
HEMATOCRIT: 20.2 % — ABNORMAL LOW (ref 36.0–46.0)
HEMOGLOBIN: 8.6 g/dL — ABNORMAL LOW (ref 13.5–16.0)
HEMOGLOBIN: 7 g/dL — ABNORMAL LOW (ref 13.5–16.0)
LARGE UNSTAINED CELLS: 3 % (ref 0–4)
LYMPHOCYTES ABSOLUTE COUNT: 0.4 10*9/L — ABNORMAL LOW (ref 1.5–5.0)
LYMPHOCYTES RELATIVE PERCENT: 4.3 %
MEAN CORPUSCULAR HEMOGLOBIN CONC: 33.8 g/dL (ref 31.0–37.0)
MEAN CORPUSCULAR HEMOGLOBIN CONC: 34.5 g/dL (ref 31.0–37.0)
MEAN CORPUSCULAR HEMOGLOBIN: 25.7 pg — ABNORMAL LOW (ref 26.0–34.0)
MEAN CORPUSCULAR HEMOGLOBIN: 26.2 pg (ref 26.0–34.0)
MEAN CORPUSCULAR VOLUME: 76 fL — ABNORMAL LOW (ref 80.0–100.0)
MEAN CORPUSCULAR VOLUME: 75.8 fL — ABNORMAL LOW (ref 80.0–100.0)
MONOCYTES ABSOLUTE COUNT: 0.7 10*9/L (ref 0.2–0.8)
MONOCYTES RELATIVE PERCENT: 7.1 %
NEUTROPHILS ABSOLUTE COUNT: 7.8 10*9/L — ABNORMAL HIGH (ref 2.0–7.5)
NEUTROPHILS RELATIVE PERCENT: 85.4 %
NUCLEATED RED BLOOD CELLS: 1 /100{WBCs} (ref <=4)
PLATELET COUNT: 294 10*9/L (ref 150–440)
PLATELET COUNT: 168 10*9/L (ref 150–440)
RED BLOOD CELL COUNT: 3.36 10*12/L — ABNORMAL LOW (ref 4.00–5.20)
RED BLOOD CELL COUNT: 2.66 10*12/L — ABNORMAL LOW (ref 4.00–5.20)
RED CELL DISTRIBUTION WIDTH: 24.9 % — ABNORMAL HIGH (ref 12.0–15.0)
WBC ADJUSTED: 13.4 10*9/L — ABNORMAL HIGH (ref 4.5–11.0)
WBC ADJUSTED: 9.2 10*9/L (ref 4.5–11.0)

## 2018-01-29 LAB — NEUTROPHILS - ABS (DIFF): Lab: 8.6 — ABNORMAL HIGH

## 2018-01-29 LAB — BLOOD GAS, VENOUS
BASE EXCESS VENOUS: -1.7 (ref -2.0–2.0)
HCO3 VENOUS: 23 mmol/L (ref 22–27)
O2 SATURATION VENOUS: 97 % — ABNORMAL HIGH (ref 40.0–85.0)
PCO2 VENOUS: 38 mmHg — ABNORMAL LOW (ref 40–60)
PO2 VENOUS: 110 mmHg — ABNORMAL HIGH (ref 30–55)

## 2018-01-29 LAB — PH VENOUS: pH:LsCnc:Pt:BldV:Qn:: 7.39

## 2018-01-29 LAB — CBC
HEMATOCRIT: 22 % — ABNORMAL LOW (ref 36.0–46.0)
HEMOGLOBIN: 7.6 g/dL — ABNORMAL LOW (ref 13.5–16.0)
MEAN CORPUSCULAR HEMOGLOBIN CONC: 34.7 g/dL (ref 31.0–37.0)
MEAN CORPUSCULAR HEMOGLOBIN: 26.3 pg (ref 26.0–34.0)
MEAN PLATELET VOLUME: 7.2 fL (ref 7.0–10.0)
NUCLEATED RED BLOOD CELLS: 1 /100{WBCs} (ref <=4)
PLATELET COUNT: 182 10*9/L (ref 150–440)
RED BLOOD CELL COUNT: 2.9 10*12/L — ABNORMAL LOW (ref 4.00–5.20)
RED CELL DISTRIBUTION WIDTH: 24.4 % — ABNORMAL HIGH (ref 12.0–15.0)
WBC ADJUSTED: 4.7 10*9/L (ref 4.5–11.0)

## 2018-01-29 LAB — MANUAL DIFFERENTIAL
LYMPHOCYTES - ABS (DIFF): 3.9 10*9/L (ref 1.5–5.0)
LYMPHOCYTES - REL (DIFF): 29 %
MONOCYTES - ABS (DIFF): 0.9 10*9/L — ABNORMAL HIGH (ref 0.2–0.8)
MONOCYTES - REL (DIFF): 7 %
NEUTROPHILS - REL (DIFF): 64 %

## 2018-01-29 LAB — BASIC METABOLIC PANEL
ANION GAP: <1 mmol/L — ABNORMAL LOW (ref 7–15)
BLOOD UREA NITROGEN: 25 mg/dL — ABNORMAL HIGH (ref 7–21)
BUN / CREAT RATIO: 37
CALCIUM: 8.7 mg/dL (ref 8.5–10.2)
CHLORIDE: 115 mmol/L — ABNORMAL HIGH (ref 98–107)
CO2: 23 mmol/L (ref 22.0–30.0)
CREATININE: 0.68 mg/dL (ref 0.60–1.00)
EGFR CKD-EPI AA FEMALE: >90 mL/min/{1.73_m2} (ref >=60)
EGFR CKD-EPI NON-AA FEMALE: 80 mL/min/{1.73_m2} (ref >=60)
GLUCOSE RANDOM: 130 mg/dL (ref 65–179)
SODIUM: 136 mmol/L (ref 135–145)

## 2018-01-29 LAB — LACTATE BLOOD VENOUS
Lactate:SCnc:Pt:BldV:Qn:: 1.3
Lactate:SCnc:Pt:BldV:Qn:: 2.2 — ABNORMAL HIGH

## 2018-01-29 LAB — MAGNESIUM
Magnesium:MCnc:Pt:Ser/Plas:Qn:: 1.9
Magnesium:MCnc:Pt:Ser/Plas:Qn:: 1.9

## 2018-01-29 LAB — APTT
APTT: 36 s (ref 25.9–39.5)
Coagulation surface induced:Time:Pt:PPP:Qn:Coag: 36

## 2018-01-29 LAB — EGFR CKD-EPI NON-AA FEMALE: Lab: 80

## 2018-01-29 LAB — TROPONIN I
Troponin I.cardiac:MCnc:Pt:Ser/Plas:Qn:: <0.06
Troponin I.cardiac:MCnc:Pt:Ser/Plas:Qn:: <0.06
Troponin I.cardiac:MCnc:Pt:Ser/Plas:Qn:: <0.06

## 2018-01-29 LAB — URINALYSIS WITH CULTURE REFLEX
BILIRUBIN UA: NEGATIVE
GLUCOSE UA: NEGATIVE
GRANULAR CASTS: 30 /LPF — ABNORMAL HIGH
LEUKOCYTE ESTERASE UA: NEGATIVE
PH UA: 6.5 (ref 5.0–9.0)
PROTEIN UA: 100 — AB
RBC UA: 23 /HPF — ABNORMAL HIGH (ref <4)
UROBILINOGEN UA: 0.2
WBC UA: 4 /HPF (ref 0–5)

## 2018-01-29 LAB — SPECIFIC GRAVITY UA: Lab: 1.02

## 2018-01-29 LAB — CREATININE: Creatinine:MCnc:Pt:Ser/Plas:Qn:: 0.73

## 2018-01-29 LAB — PRO-BNP: Natriuretic peptide.B prohormone N-Terminal:MCnc:Pt:Ser/Plas:Qn:: 1600 — ABNORMAL HIGH

## 2018-01-29 LAB — MEAN CORPUSCULAR HEMOGLOBIN: Lab: 26.2

## 2018-01-29 LAB — PROTIME-INR: INR: 1.85

## 2018-01-29 LAB — AST (SGOT): Aspartate aminotransferase:CCnc:Pt:Ser/Plas:Qn:: 148 — ABNORMAL HIGH

## 2018-01-29 LAB — INR: Lab: 1.85

## 2018-01-29 MED ORDER — SODIUM HYPOCHLORITE 0.125 % SOLUTION
Freq: Two times a day (BID) | TOPICAL | 0 refills | 0.00000 days | Status: CP
Start: 2018-01-29 — End: ?

## 2018-01-29 MED ORDER — ASCORBIC ACID (VITAMIN C) 500 MG TABLET
ORAL_TABLET | Freq: Every day | ORAL | 11 refills | 0 days | Status: CP
Start: 2018-01-29 — End: 2019-01-29

## 2018-01-29 MED ORDER — LEVOFLOXACIN 500 MG TABLET
ORAL_TABLET | ORAL | 0 refills | 0.00000 days | Status: SS
Start: 2018-01-29 — End: 2018-02-01

## 2018-01-29 MED ORDER — METRONIDAZOLE 500 MG TABLET
ORAL_TABLET | Freq: Three times a day (TID) | ORAL | 0 refills | 0 days | Status: SS
Start: 2018-01-29 — End: 2018-02-01

## 2018-01-29 NOTE — Unmapped (Signed)
Pt transferred from CCU, VSS.  No c/o pain.  Bilateral pleural drain site CDI, dressing changed and pleurex drained x 2, pt tolerated without difficulty.  Received PO antibiotics, tolerated without difficulty.  Family at bedside throughout shift.  Pt refused turns at times.  Will continue to work towards prioritized goals.

## 2018-01-29 NOTE — Unmapped (Signed)
84 year old woman mid abdomen initially 11/24/ 2019.  Family is at bedside, patient is a poor Fish farm manager.    1.  Sepsis picture.  She was initially admitted with a picture of sepsis.  At that time, one blood culture grew Klebsiella and the second which are grew Bacteroides.  It seems unusual that Klebsiella would not grew out of both for true culture positive, although Bacteroides can sometimes be difficult to culture.  Patient also had a urinary tract infection which was secondary to pansensitive Pseudomonas, and also has a pressure ulcer on her back.  She appears to responded to the combination of levofloxacin and metronidazole.    2.  Metastatic cancer. She has known static lung cancer with topple lung nodules.  There is, as well mass near the duodenum which appears to be localized near the duodenum.  In addition there is a new mass in the left hepatic lobe also appears to be metastatic.  There is also a new enhancing lesion in the inferior pole the right kidney, is also concerning for a renal malignancy.  She was also shown to have a large amount of ascites, with thickened and edematous bowel.     3.  Diastolic heart failure.  Patient has moderate pulmonary hypertension, moderate tricuspid regurgitation, and preserved ejection fraction.    4.  Anemia.  Patient has an anemia with a hematocrit of 20, the ferritin unfortunately is unfortunately not helpful, since it is a phase reactant.  The iron strongly suggest iron deficiency and this also implies the high probability of bleeding from the mass in the duodenum which may be infiltrating the duodenal wall.    Initial labs.  Electrolytes are normal except for chloride of 115, hematocrit is 22, white blood cell count 4.7, platelet count 182,000.  Reticulocyte count is 100.  KUB shows a moderate stool burden, gaseous distention of the stomach, generative disease of the spine.    Physical exam.  The patient is alert and talkative, not fully oriented.  Vital signs.  35.9, 54, 18, 90% on room air, 165/89.  Skin.  There is a 6 x 4 cm stage IV pressure ulcer of the sacrum, with dead tissue overlying, but no drainage.  Respiratory.  There are few scattered rales at the bases, otherwise they are normal.  Cardiovascular.  No murmurs or gallops.  Abdomen.  Generally benign    Impression  1.  Metastatic non-small cell lung cancer with deposits in the liver  2.  Periduodenal mass which could be metastatic or primary  3.  Mass in the in the lower pole of the right kidney, question metastasis or primary renal carcinoma.  4.  Iron deficiency anemia due to bleeding presumably from the duodenal mass.  5.  History of sepsis picture, likely UTI.  6.  Suspect contamination of blood cultures  7.  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  8.  Osteoporosis with T12 compression fracture.    Suggestions.  I think I would infuse the patient with iron, although her reticulocyte count of 100 suggest she is making some red cells.  It might be interesting to obtain a stool guaiac to see whether or not she is actually bleeding.  I am not confident that the blood cultures mean anything and are likely contaminant since Klebsiella is very easy to culture when present and should been present in both bottles.  Bacteriodes however is somewhat more difficult to culture.  She will need either collagenase or an alginate dressing for her  sacral ulcer, and I recommend against packing with gauze since she is lying on her back.  I think based on family's desire she may be discharged home and would strongly recommend hospice care.

## 2018-01-29 NOTE — Unmapped (Signed)
Pharmacist Discharge Note  Patient Name: [name] [name]  Reason for admission: Urosepsis  Reason for writing this note: new diagnosis with new medication    Highlighted medication changes with rationale (if applicable):  - Levaquin 500 mg daily x 4 more days - instructed to hold Xalkori while on Levaquin.  -metronidazole 500 mg po tid x 8 more days  -Dakins solution for wound care  -ascorbic acid 500 mg daily     Daughter is LPN and verbalized understanding of all meds.    Medication access:  - No barriers identified    Outpatient follow-up:      Burnard Bunting   Clinical Pharmacist    Future Appointments   Date Time Provider Department Center   02/06/2018  8:40 AM [name], MD [facility]   04/13/2018  8:30 AM HBR CT RM 1 [facility] [place] [facility]   04/17/2018  7:30 AM [facility]ULT [facility]   04/17/2018  8:30 AM Molli Barrows, MD [facility]   04/17/2018  9:00 AM Sonny Masters, MD [facility]

## 2018-01-29 NOTE — Unmapped (Signed)
Completed and ready for fax.

## 2018-01-29 NOTE — Unmapped (Signed)
I can sign off on the forms.

## 2018-01-29 NOTE — Unmapped (Signed)
Problem: Fall Injury Risk  Goal: Absence of Fall and Fall-Related Injury  Outcome: Progressing     VSS. Alert but confused. Patient refused some HS medications. Agreed to take some but not all of her medications. Slept well. Bed low, call bell within reach.

## 2018-01-29 NOTE — Unmapped (Signed)
Dr Allena Katz,     Your patient is due to be discharged from the hospital on 01/29/18, with orders for home health services.  To process these orders I need to know if you will sign the home health plan of care and any ongoing orders once the initial home health assessment is done by the clinician.  Patient was last seen in your office on 01/08/18 and is scheduled to be seen in your office on 02/06/18.  Please advise as we are unable to process the orders without a verified signing MD.    Thank you,   Ihor Austin LPN

## 2018-01-29 NOTE — Unmapped (Signed)
Physician Discharge Summary [facility]  4 BT[facility]  [address]  [place] [address]  Dept: [phone]  Loc: [phone]     Identifying Information:   [name] [name]  01-19-1931  [id number]    Primary Care Physician: [name], MD   Code Status: Full Code    Admit Date: 01/20/2018    Discharge Date: 01/29/2018     Discharge To: Home with Home Health and/or PT/OT    Discharge Service: HBR: Geri Inpatient     Discharge Attending Physician: Ok Edwards, MD    Discharge Diagnoses:  Principal Problem:    Sepsis (CMS-HCC) POA: Yes  Active Problems:    Diabetes mellitus, type 2 (CMS-HCC) POA: Yes    Metastatic lung cancer (metastasis from lung to other site), unspecified laterality (CMS-HCC) POA: Yes    Pressure injury of sacral region, stage 2 (CMS-HCC) POA: Yes    Urinary tract infection POA: Yes  Resolved Problems:    * No resolved hospital problems. *      Outpatient Provider Follow Up Issues:  - Continue to address GOC; pt has clear preference to avoid hospital/health care facilities and most values being at home with her grandson, though remains on aggressive medical pathway at this time  - Pt discharged with Foley, will need outpatient TOV  - Levofloxacin last day 12/6, Flagyl last day 12/8    Hospital Course:  [name] [name] is a 84 y.o. female with PMHx of metastatic NSCLC (Stage IV, on crizotinib) c/b b/l malignant effusions s/p bilateraly pleurX catheters, recent hospitalization earlier this month for cellulitis, hx multiple LE ulcers, AFib on elequis, that presented to [facility] with septic shock 2/2 Klebsiella pneumoniae and Bacteroides fragilis bacteremia.     Septic shock, K. Pneumoniae and B. Fragilis bacteremia: Patient brought to the ED by family for fever to 101.6.  In the ED, she was febrile, hypotensive and tachycardic with lactic acid of 3.1, UA with greater than 100 WBCs, chest x-ray without new consolidation, CT head without acute change, and CT AP with redemonstration of stable mesenteric mass, slightly enlarged hepatic lobe mass, right lower renal pole mass and moderate volume ascites.  She was started on vancomycin and cefepime and given sepsis fluid bolus with improvement of her lactic acid.  She continued to be hypotensive and required low dose norepinephrine to maintain blood pressure.  AM cortisol >20. She was given one dose of tobramycin. Urine culture grew Pseudomonas aeruginosa and blood cultures grew Klebsiella pneumoniae and Bacteroides fragilis.  Metronidazole was added.  Source thought to be secondary to gut translocation in the setting of likely malignant ascites and abdominal metastases.  Patient had persistent hypotension in her left radial arterial line, but as blood pressures were good in the right arm and lactate off of norepinephrine was 0.5, arterial line was removed and norepinephrine was discontinued.  Patient had good clinical improvement including improvement in mental status, blood pressure, heart rate and appetite.  Antibiotics were narrowed to oral metronidazole and oral levofloxacin on 12/1 per ID recommendations with plan to complete 2-week course of antibiotics.    Sacral decubitus ulcer: Developed while patient at home, reflective of her recent decline in functional status. WOCN consulted and topical wound care therapies provided.  - WOCN Recommendations: L and R turning only, wedges, optimize nutrition, antimicrobial dressings for prevention, pending goals of care talk, consider enzymatic debridement- if drainage worsens or there are concerns for infection to bone, please consider scan (again, pending goals of  care discussion)  - Cleanse wound with dakins. Pat dry, skin prep periwound. Apply dakins moistened 4x4 and dry cover dressing.    Left Ankle Wound  Left lateral ankle eschar  1. Cleanse wound with Normal Saline and 4 x 4 gauze, pat dry.   2. Apply non-alcohol skin barrier wipe (161096) to periwound and let dry 15 seconds.   3. Apply wound gel (045409) to gauze and apply to wound OR apply wound gel to wound bed.  4. Cover with gauze/ABD pad/appropriate cover dressing.   5. Secure dressing appropriately.   6. Change daily/PRN if dislodged, soiled or saturated.    Urinary retention: Patient had urinary retention on admission that required Foley catheter, and later failed a trial of void during admission.  She likely has some element of stretch injury to her bladder.  She should have a trial of void as an outpatient, and will be discharged with a Foley catheter in place.    Debility  Patient with significant decrease in function over past several months per history. Family reports she had been up and able to get around fairly independently until recently including going shopping with assistance, but concern patient function actually lower than previously stated by family. Notably patient has large unstageable sacral ulcer that is likely at least stage 3 if not 4 which would not expect if she is functional enough to get around on her own. Seen by PT/OT here who noted patient requires max assistance for all tasks. Now essentially bedbound with worsening debility after this hospitalization. PT/OT recommendation for SNF. However, per patient and family patient absolutely refuses SNF/ALF as a possibility and most values being home with her grandson. Will plan to d/c home with DME including ramp and hoyer lift as well as Home Health.    Goals of care: Goals of care were addressed multiple times throughout admission.  Through discussions with the patient, her son, and her daughters, it was clear that patient would prefer to stay home if possible.  She is strongly opposed to any long-term health care facility or SNF stays.  Given these goals, the care team suggested the patient should be DNR/DNI; however, family were not ready to make this change.  We did explain her overall prognosis and are concerned that she will continue to have recurrent infections and overall decline.  The patient and her family understood her prognosis and respect the patient's wishes; she will go home with HH vs to a SNF.  Would continue to readdress as an outpatient. She follows with Dr. Deland Pretty of palliative care.      **GERIATRIC ASSESSMENT:??  During her admission 11/3-11/5 she was very confused and weak, but she has a lot of family support and was able to be discharged home. Her family reports that she did improve after going home and was able to go out to eat with them. Then a few days before this presentation she had decreased oral intake and worsening fatigue/lethargy.    See note from recent hospitalization:  Ms. Lechtenberg is a pleasant 84 year-old female with stage IV NSCLC with brain and liver mets as well as malignant pleural effusions s/p pleurx catheters. She was diagnosed with lung cancer this year. Prior to that, her family reports that she was very independent. She has a home in the [place] area. She was doing her own shopping and preparing large meals for family. She has had 6 children, 4 living, and has a lot of support from family that has  allowed her to continue living at home since her cancer diagnosis. Her daughter came down from [place] in June to live with her to help as her function declined. Her son and other family members live very close to her as well. Her son, [name], is her HCPOA and he manages her finances and organizes her medications. Her daughter reports that she has had more weakness over the last month and is needing more help with getting to the toilet, bathing, and transfers. She uses a walker but every once in a while will walk without any assistive devices. Her family gives her a bed bath most days and twice a week will get her into the shower but Ms. Presswood sometimes gets angry about the shower.   ??    Functional Assessment   ??  ADLs:?? IADLs:   Feeding:??Independent  Dressing:??Requires Assistance  Ambulation:??Requires Assistance Toileting:??Requires Assistance  Bathing:??Requires Assistance  ?? Using the phone:??Requires Assistance  Shopping:??Dependent  Meal preparation:??Dependent  Medication mgmt:??Dependent  Managing finances:??Dependent  Housework:??Dependent  Transportation (driving or navigating public transit):??Dependent   ??  Living situation: Patient lives in??own home??with grandsons. Her daughter is staying wit her.   ??  Changes in ADLs during hospitalization:??Full dependent in all ADLs except feeding (requires assistance) at time of discharge  ??  Assistive devices:??walker  ??  Additional services recommended at discharge:??Home health PT, OT, and wound care  ??  Cognitive Assessment   ??  Delirium Assessment: CAM (Confusion Assessment Method):    On discharge, the patient??did show evidence of delirium though improved overall. Discharging mental status: Improved. ??   ??  Other cognitive assessment:????In March of this year there was documentation of some mild cognitive disorder but do not see a SLUMS or MOCA documented.  ??  Advance Care Planning   ??  See ACP Note by Dr. Doreatha Martin 01/16/18: Inquired regarding whether patient has any care preferences in the event that she becomes seriously ill.  Patient son states that she has completed a living well though it is currently at home.  He plans to bring to her next visit.  Son's understanding is that patient wants to ride this out in terms of receiving all treatments until there is nothing left.  This would include being on a respirator.  Discussed low likelihood that care in intensive care unit would enable patient to return to her current level of function and that she would most likely not survive.  She states wanting to wait and see. Son states that patient is living for her family and particularly her great grandson who is 13 years old and for whom she began providing care when he was 59 weeks old secondary to a mother who was living with addiction.  ??  Code Status:??Full Code Surrogate decision maker:??Her son, Saleah Rishel  ??  Emergency Contact:  Extended Emergency Contact Information  Primary Emergency Contact: Laroque,Jessie  ??[place]  Home Phone: [phone]  Mobile Phone: [phone]  Relation: Son  Secondary Emergency Contact: Viviann Spare  Mobile Phone: [phone]  Relation: Daughter        Touchbase with Outpatient Provider:  Warm Handoff: Completed on 01/29/18 by Everlean Cherry  (Intern) via [facility] Message    Procedures:  arterial line and internal jugular line    ______________________________________________________________________  Discharge Medications:     Your Medication List      STOP taking these medications    furosemide 20 MG tablet  Commonly known as:  LASIX  START taking these medications    ascorbic acid (vitamin C) 500 MG tablet  Commonly known as:  VITAMIN C  Take 1 tablet (500 mg total) by mouth daily.     levoFLOXacin 500 MG tablet  Commonly known as:  LEVAQUIN  Take 1 tablet (500 mg total) by mouth daily. for 4 days     metroNIDAZOLE 500 MG tablet  Commonly known as:  FLAGYL  Take 1 tablet (500 mg total) by mouth Three (3) times a day. for 8 days     sodium hypochlorite 0.125 % Soln  Commonly known as:  DAKIN's (QUARTER STRENGTH)  Apply topically Two (2) times a day.        CONTINUE taking these medications    BD ULTRA-FINE SHORT PEN NEEDLE 31 gauge x 5/16 Ndle  Generic drug:  pen needle, diabetic  USE AS DIRECTED. USE 4 NEEDLES A DAY WITH DIABETIC MEDICATION PENS AS DIRECTED     blood sugar diagnostic Strp  Check 4x/day as instructed     blood sugar diagnostic Strp  Commonly known as:  glucose blood  Check blood sugar 4 times daily.     blood-glucose meter Misc  1 each by Miscellaneous route Four (4) times a day. for 1 day     blood-glucose meter kit  Use as instructed     calcium carbonate-vitamin D3 600 mg (1,500 mg)-800 unit Chew  Commonly known as:  CALTRATE 600 PLUS D  Chew 1 tablet Two (2) times a day. ELIQUIS 5 mg Tab  Generic drug:  apixaban  Take 5 mg by mouth Two (2) times a day. .     lancets Misc  1 each by Miscellaneous route Four (4) times a day.     lancets Misc  Commonly known as:  ACCU-CHEK SOFTCLIX LANCETS  Use 4 times daily.     lidocaine 4 % patch  Place 1 patch on the skin daily as needed.     metFORMIN 500 MG 24 hr tablet  Commonly known as:  GLUCOPHAGE-XR  TAKE 2 TABLETS (1,000 MG TOTAL) BY MOUTH TWO (2) TIMES A DAY.     mirtazapine 7.5 MG tablet  Commonly known as:  REMERON  Take 1 tablet (7.5 mg total) by mouth nightly.     NovoLOG Mix 70-30FlexPen U-100 100 unit/mL (70-30) injection pen  Generic drug:  insulin aspart protamine-insulin aspart  Inject 0.08 mL (8 Units total) under the skin two (2) times a day.     potassium chloride SA 20 MEQ tablet  Commonly known as:  KLOR-CON  Take 10 mEq by mouth Two (2) times a day. 1/2 tablet = 10 mEq     ranitidine 150 MG tablet  Commonly known as:  ZANTAC  Take 150 mg by mouth Two (2) times a day.     XALKORI 250 mg capsule  Generic drug:  crizotinib  Take 1 capsule (250 mg total) by mouth Two (2) times a day.            Allergies:  Sulfasalazine  ______________________________________________________________________  Pending Test Results (if blank, then none):      Most Recent Labs:  All lab results last 24 hours -   Recent Results (from the past 24 hour(s))   POCT Glucose    Collection Time: 01/28/18  5:32 PM   Result Value Ref Range    Glucose, POC 280 (H) 65 - 179 mg/dL   POCT Glucose    Collection Time: 01/28/18  8:27 PM  Result Value Ref Range    Glucose, POC 254 (H) 65 - 179 mg/dL   Basic metabolic panel    Collection Time: 01/29/18  6:24 AM   Result Value Ref Range    Sodium 136 135 - 145 mmol/L    Potassium 3.5 3.5 - 5.0 mmol/L    Chloride 115 (H) 98 - 107 mmol/L    CO2 23.0 22.0 - 30.0 mmol/L    Anion Gap <1 (L) 7 - 15 mmol/L    BUN 25 (H) 7 - 21 mg/dL    Creatinine 8.11 9.14 - 1.00 mg/dL    BUN/Creatinine Ratio 37     EGFR CKD-EPI Non-African American, Female 80 >=60 mL/min/1.77m2    EGFR CKD-EPI African American, Female >90 >=60 mL/min/1.98m2    Glucose 130 65 - 179 mg/dL    Calcium 8.7 8.5 - 78.2 mg/dL   CBC    Collection Time: 01/29/18  6:24 AM   Result Value Ref Range    WBC 4.7 4.5 - 11.0 10*9/L    RBC 2.90 (L) 4.00 - 5.20 10*12/L    HGB 7.6 (L) 13.5 - 16.0 g/dL    HCT 95.6 (L) 21.3 - 46.0 %    MCV 75.9 (L) 80.0 - 100.0 fL    MCH 26.3 26.0 - 34.0 pg    MCHC 34.7 31.0 - 37.0 g/dL    RDW 08.6 (H) 57.8 - 15.0 %    MPV 7.2 7.0 - 10.0 fL    Platelet 182 150 - 440 10*9/L    nRBC 1 <=4 /100 WBCs   POCT Glucose    Collection Time: 01/29/18  7:23 AM   Result Value Ref Range    Glucose, POC 131 65 - 179 mg/dL   POCT Glucose    Collection Time: 01/29/18 11:49 AM   Result Value Ref Range    Glucose, POC 140 65 - 179 mg/dL       Relevant Studies/Radiology (if blank, then none):  Xr Chest Portable    Result Date: 01/22/2018  EXAM: XR CHEST PORTABLE DATE: 01/22/2018 ACCESSION: [id number] UN DICTATED: 01/22/2018 7:10 PM [facility]PRETATION [facility]ATION[facility] CLINICAL INDICATION: 84 years old Female with CATHETER VASCULAR FIT&ADJ  COMPARISON: CT abdomen/pelvis 01/21/2018. Chest radiograph 01/20/2018. TECHNIQUE: Portable Chest Radiograph. CONCLUSIONS: Left IJ central venous catheter with tip in the right atrium. Right-sided chest tube with tip projecting over the peripheral right upper lobe. Additional catheter is coiled over the left lung base/left upper quadrant with tip projecting over the mediastinum. Low lung volumes. Mild pulmonary edema. Left lower lobe/retrocardiac consolidation may represent combination of infection and atelectasis. Small left pleural effusion. No pneumothorax. Stable cardiomediastinal silhouette.     Ct Head Wo Contrast    Result Date: 01/21/2018  EXAM: Computed tomography, head or brain without contrast material. DATE: 01/20/2018 11:37 PM ACCESSION: [id number] UN DICTATED: 01/20/2018 11:38 PM [facility]PRETATION LOCATION: [facility] CLINICAL INDICATION: 84 years old Female with ALTERED MENTAL STATUS  COMPARISON: CT head 11/08/2017. MRI brain 01/12/2018. TECHNIQUE: Axial CT images of the head  from skull base to vertex without contrast. FINDINGS: No acute intracranial hemorrhage. No evidence of acute infarct. Unchanged subcentimeter hypoattenuating lesion in the right posterior limb internal capsule consistent with remote infarct. No mass lesions or midline shift. Basilar cisterns are patent. There are scattered and confluent hypodense foci within the periventricular and deep white matter. These are nonspecific but commonly associated with small vessel ischemic changes. Unchanged appearance of irregular sclerotic focus in the left frontotemporal calvarium, better evaluated on  recent MRI. No acute osseous abnormalities. Prominent mucosal thickening in the visualized right maxillary sinus. The paranasal sinuses are otherwise pneumatized. Mastoid air cells are clear.     - No acute intracranial abnormality. - Unchanged appearance of sclerotic left frontotemporal calvarial metastatic lesion.     Xr Chest 2 Views    Result Date: 01/21/2018  EXAM: XR CHEST 2 VIEWS DATE: 01/20/2018 11:37 PM ACCESSION: [id number] UN DICTATED: 01/20/2018 11:35 PM [facility]PRETATION [facility] CLINICAL INDICATION: 84 years old Female with ALTERED MENTAL STATUS  COMPARISON: Chest radiograph dated 11/01/2017 TECHNIQUE: PA and Lateral Chest Radiographs. FINDINGS: Patient is rotated. Unchanged positioning of left-sided chest tube. Right-sided chest tube with tip overlying the right lung base. Interval decrease in size of left pleural effusion with no appreciable pneumothorax  identified. Bibasilar atelectasis. No right pleural effusion. Unchanged cardiomediastinal silhouette.     --Unchanged left-sided chest tube with its small left pleural effusion, decreased from prior. No appreciable pneumothorax. --Right-sided chest tube overlying the right lung base.    Ct Abdomen Pelvis With Iv Contrast Only    Result Date: 01/21/2018  EXAM: CT ABDOMEN PELVIS W CONTRAST DATE: 01/21/2018 1:31 AM ACCESSION: [id number] UN DICTATED: 01/21/2018 1:36 AM [facility]PRETATION [facility] CLINICAL INDICATION: Sepsis, query abscess  COMPARISON: CT of the chest dated 01/12/2018, 09/15/2017, and 04/30/2017. TECHNIQUE: A spiral CT scan of the abdomen and pelvis was obtained with IV contrast from the lung bases through the pubic symphysis. Images were reconstructed in the axial plane. Coronal and sagittal reformatted images were also provided for further evaluation. FINDINGS: LINES AND TUBES: Bilateral chest tubes in place. LOWER THORAX: Small to moderate bilateral pleural effusions with associated adjacent atelectasis. No pulmonary nodules. No pericardial effusion. Scattered atherosclerotic plaque/calcifications of the coronary vasculature. HEPATOBILIARY: No focal hepatic lesions. The gallbladder is surgically absent. No biliary dilatation.  SPLEEN: Borderline enlarged, measuring up to 12.9 cm in craniocaudal dimension. PANCREAS: Unremarkable. ADRENALS: Unremarkable. KIDNEYS/URETERS: There is a 2.7 x 1.5 cm heterogeneous enhancing lesion within the right inferior pole (2:76). Additional subcentimeter hypoattenuating lesion within the right kidney are too small to characterize. The kidneys otherwise enhance symmetrically. No hydronephrosis. No urinary tract calculi identified. BLADDER: Partially distended with mildly thickened walls, likely reactive. PELVIC/REPRODUCTIVE ORGANS: The uterus is surgically absent. GI TRACT: There are multiple thickened and mildly hyperemic loops of bowel, likely reactive. No dilated or thick walled loops of bowel. Colonic diverticulosis without evidence of diverticulitis. The appendix is unremarkable. PERITONEUM/RETROPERITONEUM AND MESENTERY: Large volume diffuse ascites. There is a 6.6 x 5.9 x 5.9 cm heterogeneously enhancing mass adjacent to the third portion of the duodenum (2:72, 4:25). LYMPH NODES: No enlarged lymph nodes. VESSELS: The aorta is normal in caliber.  Severe atherosclerotic calcifications of the abdominal aorta and its branch vessels, which appear normal in caliber. The portal venous system is patent. The hepatic veins and IVC are unremarkable. BONES AND SOFT TISSUES: Diffuse osteopenia. Severe multilevel degenerative disc disease. Chronic T12 compression fracture deformity. Multiple sclerotic lesions noted in the T9, L1, and L5 vertebral bodies, likely bone islands. Prominent Schmorl's node within the L4 vertebral body. No suspicious osseous lesions.     -- 6.6 cm heterogeneously enhancing mass within the right upper quadrant adjacent to the third portion of the duodenum, concerning for malignancy of unknown origin. -- 2.7 x 1.5 cm heterogeneous enhancing lesion within the right inferior pole, concerning for malignancy. Recommend follow-up imaging with contrast-enhanced MRI versus renal mass focal CT. -- Large volume ascites with multiple loops of thickened and  hyperemic bowel, likely reactive enterocolitis. ==================== ATTENDING ADDENDUM (01/21/2018 11:42 AM): - Note that the patient has known metastatic non-small cell lung cancer. The mesenteric mass in the central/upper abdomen has been present on several prior chest CTs, for example 09/15/2017 and is not markedly changed in the interval. Note is made of a mass in the lateral left hepatic lobe measuring 3.7 x 2.5 cm (2:37), also likely metastatic. This has slightly enlarged since recent prior studies although is smaller than it was on 04/30/2017. - Agree that a right lower pole renal mass is present, which could be either metastatic or a primary renal neoplasm. - Agree with the presence of moderate volume ascites. A differential should include malignant ascites although no definite peritoneal nodularity is identified.    Xr Abdomen Portable    Result Date: 01/27/2018  EXAM: XR ABDOMEN PORTABLE DATE: 01/27/2018 9:57 AM ACCESSION: [id number] UN DICTATED: 01/27/2018 11:47 AM [facility]PRETATION LOCATION: [facility] CLINICAL INDICATION: 84 years old Female with CONSTIPATION  COMPARISON: CT abdomen pelvis 01/21/2018. TECHNIQUE: Supine view of the abdomen. FINDINGS: Gaseous distention of the stomach. Nonobstructive bowel gas pattern. Moderate colonic stool burden. Multilevel degenerative changes of the lumbar spine. Sclerotic bone lesions in the lower lumbar spine left iliac and ischium and multiple displaced left rib fractures are better evaluated evaluated on CT 01/21/2018. Left basilar opacity and small left pleural effusion. Intravenous catheter tip is partially visualized projecting over the right atrium.     Nonobstructive bowel gas pattern. Moderate colonic stool burden.    ______________________________________________________________________  Discharge Instructions:           Other Instructions     Discharge instructions to patient: Call your primary care doctor and make an appointment to see them:      Within 2 weeks from the time you are discharged from the hospital               Resources and Referrals     Patient Lift (DME)      Type:  Standard Lift    Length of Need:  99 mo    Hoyer lift, please.                Follow Up instructions and Outpatient Referrals     Ambulatory referral to Home Health      Disciplines requested:   Nursing  Home Health Aide  Physical Therapy       Nursing requested:   Other: (please enter in comments) Comment - Pls see instructions below  Teaching/skilled observation and assessment       What teaching is needed (new diagnosis? new medications?):  Wound care    Physical Therapy requested:   Strengthening exercises  Evaluate and treat  Home safety evaluation       Physician to follow patient's care:  PCP    Requested start of care date:  Routine (within 48 hours)    Do you want ongoing co-management?:  Yes    Care coordination required?:  No    I certify that [name] [name] is confined to his/her home and needs intermittent skilled nursing care, physical therapy and/or speech therapy or continues to need occupational therapy. The patient is under my care, and I have authorized services on this plan of care and will periodically review the plan. The patient had a face-to-face encounter with an allowed provider type on 01/29/2018 and the encounter was related to the primary reason for home health care.     Skilled Nursing:  1. VS and O2 saturation assessment  2. Reconcile discharge/home medications  3. Teach and reinforce medication management  4. Any new medication teaching and reinforcement    5. Any teaching and reinforcement of disease management    6. Teaching and reinforcement of wound care   7. Assess wound for signs and symptoms of infection (purulent drainage, erythema, wound dehiscence)    Home Health RN, please reinforce foley care teaching with patient and/or caregiver and facilitate access to supplies.   Pt with bilateral pleurx drains. Caregiver know how to take care of the drains but please reinforce teaching:  Monitor right PleurX catheter site for signs of infections (purulent drainage, erythema, seroma, and dehiscence).    Teach and reinforce left chest pleurX catheter site and dressing care(foam catheter pad/gauze/occlusive dressing) , along with drainage care to pleurX drainage container. Drain catheter and dressing change every other day.  Patient may shower, but should not immerse wounds for 2 - 3 weeks.   Remove any remaining external surgical or chest tube dressings in 48 hours. May wash the area with soap and water, but do not scrub. Apply an external dressing of dry gauze and paper tape as needed to keep incision(s) clean and dry.     Pressure Injury Sacrum Mid Unstageable: BID: Cleanse wound with daikins. Pat dry, skin prep periwound. Apply dakins moistened 4x4 and dry cover dressing.    Left lateral ankle eschar  1. Cleanse wound with Normal Saline and 4 x 4 gauze, pat dry. 2. Apply non-alcohol skin barrier wipe (161096) to periwound and let dry 15 seconds.   3. Apply wound gel (045409) to gauze and apply to wound OR apply wound gel to wound bed.  4. Cover with gauze/ABD pad/appropriate cover dressing.   5. Secure dressing appropriately.   6. Change daily/PRN if dislodged, soiled or saturated.         Discharge instructions      You were admitted to [facility] for a blood stream infection. You will need to continue to take antibiotics by mouth as directed. Please resume your crizotinib after you have finished the antibiotics. Please follow up with your primary care doctor as well as Dr. Legrand Como.    Please carefully read and follow these instructions below upon your discharge:    1) Please take your medications as prescribed and note the changes listed on your discharge. At future follow-up appointments, please be sure to take all of your medications with you so your provider can better guide your care.     2) Seek medical care with your primary care doctor or local Emergency Room or Urgent Care if you develop any changes in your mental status, worsening abdominal pain, fevers greater than 101.5, any unexplained/unrelieved shortness of breath, uncontrolled nausea and vomiting that keeps you from remaining hydrated or taking your medication, or any other concerning symptoms.     3) Please go to your follow-up appointments. Some of your follow-up appointments have been listed below. If you do not see an appointment listed below with your primary care doctor, please call your doctor's office as soon as possible to schedule an appointment to be seen within 7-10 days of discharge.     4) If you have any concerns before you are able to follow-up with your primary care doctor, you can reach [place] by calling [phone] and asking to page the geratrics (or Med A) resident on call.  Appointments which have been scheduled for you    Feb 06, 2018  8:40 AM EST  (Arrive by 8:25 AM) OFFICE VISIT with [name], MD  [facility] FAMILY MEDICINE [place] [facility] REGION) [address] [place] [address]  [place] [address]  [phone]      Apr 13, 2018  8:30 AM EST  (Arrive by 8:15 AM)  CT CHEST WO CONTRAST with HBR CT RM 1  IMG CT HBR [facility] [place]) [address]  [place] [address]  [phone]   Let us know if pt:  Pregnant or nursing  Claustrophobic    (Title:[id number])     Apr 17, 2018  7:30 AM EST  (Arrive by 7:00 AM)  LAB ONLY [place] with ADULT ONC LAB  [facility] ADULT ONCOLOGY LAB DRAW STATION [place] [facility] REGION) [address]  [place] [address]  [phone]      Apr 17, 2018  8:30 AM EST  (Arrive by 8:00 AM)  RETURN LUNG ACTIVE  with Molli Barrows, MD  [place] ONCOLOGY MULTIDISCIPLINARY 2ND FLR CANCER HOSP [facility] REGION) [address]  [place] [address]  [phone]      Apr 17, 2018  9:00 AM EST  (Arrive by 8:30 AM)  RETURN PALLIATIVE CARE with Sonny Masters, MD  [place] ONCOLOGY MULTIDISCIPLINARY 2ND FLR CANCER HOSP [facility] REGION) [address]  [place] [address]  [phone]           ______________________________________________________________________  Discharge Day Services:  BP 165/89  - Pulse 54  - Temp 35.9 ??C (Oral)  - Resp 18  - Ht 162.6 cm (5' 4.02)  - Wt 63.9 kg (140 lb 14 oz)  - SpO2 99%  - BMI 24.17 kg/m??   Pt seen on the day of discharge and determined appropriate for discharge.    Condition at Discharge: fair    Length of Discharge: I spent greater than 30 mins in the discharge of this patient.

## 2018-01-29 NOTE — Unmapped (Signed)
Faxed paperwork back to [facility]. Confirmation received.

## 2018-01-30 LAB — CBC W/ AUTO DIFF
BASOPHILS ABSOLUTE COUNT: 0 10*9/L (ref 0.0–0.1)
BASOPHILS RELATIVE PERCENT: 0.6 %
EOSINOPHILS ABSOLUTE COUNT: 0 10*9/L (ref 0.0–0.4)
EOSINOPHILS RELATIVE PERCENT: 0.2 %
HEMATOCRIT: 20.4 % — ABNORMAL LOW (ref 36.0–46.0)
HEMOGLOBIN: 7 g/dL — ABNORMAL LOW (ref 13.5–16.0)
LARGE UNSTAINED CELLS: 3 % (ref 0–4)
LYMPHOCYTES ABSOLUTE COUNT: 0.7 10*9/L — ABNORMAL LOW (ref 1.5–5.0)
LYMPHOCYTES RELATIVE PERCENT: 9.4 %
MEAN CORPUSCULAR HEMOGLOBIN CONC: 34.4 g/dL (ref 31.0–37.0)
MEAN CORPUSCULAR HEMOGLOBIN: 26.1 pg (ref 26.0–34.0)
MEAN CORPUSCULAR VOLUME: 75.9 fL — ABNORMAL LOW (ref 80.0–100.0)
MEAN PLATELET VOLUME: 7.4 fL (ref 7.0–10.0)
MONOCYTES ABSOLUTE COUNT: 0.6 10*9/L (ref 0.2–0.8)
MONOCYTES RELATIVE PERCENT: 8.9 %
NEUTROPHILS RELATIVE PERCENT: 77.6 %
PLATELET COUNT: 178 10*9/L (ref 150–440)
RED BLOOD CELL COUNT: 2.69 10*12/L — ABNORMAL LOW (ref 4.00–5.20)
RED CELL DISTRIBUTION WIDTH: 24.8 % — ABNORMAL HIGH (ref 12.0–15.0)
WBC ADJUSTED: 7.1 10*9/L (ref 4.5–11.0)

## 2018-01-30 LAB — BLOOD GAS, VENOUS
BASE EXCESS VENOUS: 1.5 (ref -2.0–2.0)
O2 SATURATION VENOUS: 73.3 % (ref 40.0–85.0)
PCO2 VENOUS: 36 mmHg — ABNORMAL LOW (ref 40–60)
PH VENOUS: 7.46 — ABNORMAL HIGH (ref 7.32–7.43)
PO2 VENOUS: 41 mmHg (ref 30–55)

## 2018-01-30 LAB — TARGET CELLS

## 2018-01-30 LAB — PCO2 VENOUS: Lab: 36 — ABNORMAL LOW

## 2018-01-30 LAB — COMPREHENSIVE METABOLIC PANEL
ALBUMIN: 1.5 g/dL — ABNORMAL LOW (ref 3.5–5.0)
ALT (SGPT): 23 U/L (ref <35)
ANION GAP: <1 mmol/L — ABNORMAL LOW (ref 7–15)
AST (SGOT): 39 U/L — ABNORMAL HIGH (ref 14–38)
BILIRUBIN TOTAL: 0.2 mg/dL (ref 0.0–1.2)
BLOOD UREA NITROGEN: 25 mg/dL — ABNORMAL HIGH (ref 7–21)
BUN / CREAT RATIO: 36
CALCIUM: 8.5 mg/dL (ref 8.5–10.2)
CHLORIDE: 114 mmol/L — ABNORMAL HIGH (ref 98–107)
CO2: 22 mmol/L (ref 22.0–30.0)
CREATININE: 0.7 mg/dL (ref 0.60–1.00)
GLUCOSE RANDOM: 177 mg/dL (ref 65–179)
POTASSIUM: 3.5 mmol/L (ref 3.5–5.0)
PROTEIN TOTAL: 3.9 g/dL — ABNORMAL LOW (ref 6.5–8.3)
SODIUM: 135 mmol/L (ref 135–145)

## 2018-01-30 LAB — CO2: Carbon dioxide:SCnc:Pt:Ser/Plas:Qn:: 22

## 2018-01-30 LAB — LACTATE, VENOUS, WHOLE BLOOD: LACTATE BLOOD VENOUS: 1.1 mmol/L (ref 0.5–1.8)

## 2018-01-30 LAB — BASOPHILS RELATIVE PERCENT: Lab: 0.6

## 2018-01-30 LAB — SLIDE REVIEW

## 2018-01-30 LAB — LACTATE BLOOD VENOUS: Lactate:SCnc:Pt:BldV:Qn:: 1.1

## 2018-01-30 NOTE — Unmapped (Signed)
Repeat Volume Status and Tissue Perfusion Assessment    I personally examined this patient at 12:48 AM and attest that I have completed a focused tissue perfusion assessment.    BMI:    kg/m??   Height:      Weight:     IBW:  54.7 kg  IV Fluid Bolus:  BMI is equal to or < 30, so fluid resuscitation ordered based on actual body weight  Mental Status:  (performed 30 minutes prior to this note, patient sleeping comfortably at the time of this note) speaking more quickly and speech less slurred, still refusing to answer questions--per daughter mental status improved  Cardiopulmonary Exam: Heart with regular rate and rhythm. lungs clear to auscultation.  Capillary Refill:  < or = 2 seconds  Peripheral Pulse Evaluation:  Unable to palpate given edema but extremities warm   Skin Examination:  No signs of Cyanosis    Vital Signs At Time Of Assessment:    Temperature     Date/Time Temp Temp src      01/30/18 0000  36.3 ??C  Oral         Pulse, Blood Pressure, Respiratory Rate     Date/Time Pulse BP MAP (mmHg) Resp    01/30/18 0000  107  100/65  71  12

## 2018-01-30 NOTE — Unmapped (Signed)
Admitting MD at the bedside.  

## 2018-01-30 NOTE — Unmapped (Addendum)
Outpatient Provider Issues to Follow Up  -- PCP follow up with Dr. Allena Katz scheduled for 02/06/18.  -- Follow up results of upper extremity PVL dopplers. Preliminary read indicates obstruction in left axillary artery.  -- Recommend CBC and BMP to monitor Hgb and potassium.  -- Recommend UA and urine culture at 02/06/18 appointment. Also recommend trial of void as outpatient.  -- Follow up evaluation with PCP for possible adjustments to Lasix dosing (pt discharged on 20mg  daily).  -- Final dose of levofloxacin on 02/02/18.  -- Metronidazole last day on 02/04/18.  -- Recommend continuing to address goals of care. Patient and family prefer for her to be at home and may be ready to consider more of a comfort care approach.    [name] [name] is an 84 yo woman with a PMHx of metastatic NSCLC (Stage IV, on crizotinib) c/b b/l malignant effusions s/p bilateral pleurX catheters, hx multiple LE ulcers, AFib on elequis, and anemia who re-presented after discharge on 01/29/18 with acute shortness of breath, hypotension, and tachycardia.  ??  Acute Dyspnea, Possible Recurrent Sepsis  Patient with sudden onset shortness of breath after she transferred to the car to be discharged from the hospital on 01/29/18. She was originally admitted on 01/21/18 and treated for septic shock (Klebsiella and Bacteroides bacteremia, also with Pseudomonas UTI). On re-admission exam, patient was tachycardic and hypotensive. She was given IV fluids with improvement in hemodynamic status. CXR unchanged and CTA negative for PE. Lactate and WBC initially elevated but subsequently normalized. She also has a deep sacral ulcer, but no evidence of active infection. Patient was afebrile and without chills. Troponins negative. EKG showed Afib with RVR. Precise etiology for sudden onset dyspnea unclear, but possibilities include recurrent sepsis, worsening heart failure (history of diastolic dysfunction), flash pulmonary edema, and anxiety (her son believes she was quite anxious as she was getting into the car to leave hospital). Throughout admission, [name] [name] was hemodynamically stable. One blood culture grew gram positive cocci in pairs (likely a contaminant based on previous cultures) while the second culture had no growth at 48 hours. The patient was initially started on vanc/cefepime/flagyl, but antibiotics were de-escalated to the regimen she was recently discharged on, levofloxacin (last day 12/6) and metronidazole (last day 12/8). Urine culture returned positive for candida glabrata on day of discharge. Given patient's kidney function, borderline QTc, and tendency of candida glabrata to be resistant to azoles, a course of fluconazole was not initiated. She was treated with one dose of micafungin IV 100mg   and her foley catheter was changed. Recommend repeat UA and urine culture at PCP follow up visit.  ??  Anasarca  Patient was volume overloaded with  generalized edema and JVD. Suspect related to IVF resuscitation on admission. Blood pressures were soft, but stable throughout admission. She received one dose of IV diuresis with Lasix 20mg  and was discharged on furosemide 20mg  PO daily. Potassium decreased to 2.8 on day of discharge, likely due to loop diuretic. She has close follow up with PCP for repeat BMP and furosemide dose adjustments as necessary.  ??  Diminished Pulses in Left Upper Extremity  Patient with significantly diminished pulses in left upper extremity. Bilateral upper extremity dopplers were obtained. Preliminary results indicated obstruction in left axillary artery. Patient asymptomatic. She has follow up with PCP on 12210/19.  ??  Deep Sacral Decubitus Ulcer  No evidence of active infection, but wound remains at high risk of infection. Necrotic tissue was debrided from wound base on 01/30/18.  Collagenase moist dressings were utilized throughout admission.  ??  Stage IV NSCLC, Bilateral Pleural Effusions  Patient with history of metastatic lung cancer with known metastases to liver and bilateral malignant pleural effusions. PleurX catheters are place and drained every other day. She does follow with Dr. Legrand Como for Palliative Care. Crizotinib was held in setting of possible infections. She has follow up with Oncology scheduled for February 2020. Family may be ready to consider comfort care.  ??  Urinary Retention  During prior admission, patient failed trial of void and was discharged with foley catheter in place. Foley changed on day of discharge (02/01/18). Recommend trial of void as outpatient.   ??  Anemia  Likely a combination of iron deficiency and anemia of chronic inflammation based on iron studies. During prior admission hemoglobin was stable around 8s. During this hospitalization, Hgb hovered around 7 and was 7.2 on day of discharge. 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 Calculated iron deficit was approximately 930mg . She was treated with IV ferric gluconate 125mg  BID. Patient was discharged on ferrous sulfate 325mg  BID.  ??  Atrial Fibrillation  Heart rate under fairly good control in the 90-100 range, after initial elevation in the ED. Patient continued on apixaban for anti-coagulation. Given possibility of bleeding from duodenal mass, benefits of anticoagulation may not outweigh risks of bleeding and also may depend on outcome of future goals of care discussions.  ??  T2DM  Hemoglobin A1c 6.4 on 01/01/18. Prior to previous admission, patient had been on home insulin Novolog 70/30 8 units BID with some borderline hypoglycemia at home. She was treated with sliding scale insulin with blood glucose under reasonably good control during admission. Patient discharged on home regimen.        **GERIATRIC ASSESSMENT:  During her admission 12/31/17-01/02/18 she was very confused and weak, but she has a lot of family support and was able to be discharged home. Her family reports that she did improve after going home and was able to go out to eat with them. Then a few days before 11/23 presentation she had decreased oral intake and worsening fatigue/lethargy. She became acutely short of breath after getting into the car on 01/29/18 to be discharged, which led to her re-admission.  ??  See note from recent hospitalization:  [name] [name] is a pleasant 84 year-old female with stage IV NSCLC with brain and liver mets as well as malignant pleural effusions s/p pleurx catheters. She was diagnosed with lung cancer this year. Prior to that, her family reports that she was very independent. She has a home in the [place] area. She was doing her own shopping and preparing large meals for family. She has had 6 children, 4 living, and has a lot of support from family that has allowed her to continue living at home since her cancer diagnosis. Her daughter came down from [place] in June to live with her to help as her function declined. Her son and other family members live very close to her as well. Her son, [name], is her HCPOA and he manages her finances and organizes her medications. Her daughter reports that she has had more weakness over the last month and is needing more help with getting to the toilet, bathing, and transfers. She uses a walker but every once in a while will walk without any assistive devices. Her family gives her a bed bath most days and twice a week will get her into the shower but [name] [name] sometimes  gets angry about the shower.    Admit Date:  Functional Assessment        ADLs:  IADLs:   Feeding: Requires Assistance  Dressing: Dependent  Ambulation: Dependent  Toileting: Dependent  Bathing: Dependent   Using the phone: Requires Assistance  Shopping: Dependent  Meal preparation: Dependent  Medication mgmt: Dependent  Managing finances: Dependent  Housework: Dependent  Transportation (driving or navigating public transit): Dependent     Living situation: Patient lives in own home with grandsons. Her daughter is also staying with her. Changes in ADLs during hospitalization: None    Assistive devices: Walker    Additional services recommended at discharge: Home health PT, OT, and wound care    Cognitive Assessment     Delirium Assessment: CAM (Confusion Assessment Method):    On discharge, the patient did show evidence of delrium, although improved overall. Discharging mental status: Improved.      Other cognitive assessment:  In March 2019 there was documentation of some mild cognitive disorder but do not see a SLUMS or MOCA documented.    Advance Care Planning     See ACP Note by Dr. Doreatha Martin 01/16/18: Inquired regarding whether patient has any care preferences in the event that she becomes seriously ill. ??Patient son states that she has completed a living well though it is currently at home. ??He plans to bring to her next visit. ??Son's understanding is that patient wants to ride this out in terms of receiving all treatments until there is nothing left. ??This would include being on a respirator. ??Discussed low likelihood that care in intensive care unit would enable patient to return to her current level of function and that she would most likely not survive. ??She states wanting to wait and see. Son states that patient is living for her family and particularly her great grandson who is 51 years old and for whom she began providing care when he was 64 weeks old secondary to a mother who was living with addiction.    The patient {ACP Geri [id number]}.    Code Status: Full Code    Surrogate decision maker: Patient's Son, Jamelah Sitzer    Emergency Contact:  Extended Emergency Contact Information  Primary Emergency Contact: Kottke,Jessie   [place]  Home Phone: [phone]  Mobile Phone: [phone]  Relation: Son  Secondary Emergency Contact: Viviann Spare  Mobile Phone: [phone]  Relation: Daughter    _____________________________        Why was I admitted?   You have been admitted with shortness of breath, weakness, and looking unwell after leaving the hospital. We kept a close eye on you while you were in the hospital and your blood pressure and heart rate looked good while you were here. You did not have any fevers or trouble breathing while you were in the hospital. We did not have any concerns for an additional infection in your blood and were able to switch back to the oral antibiotics that you were taking before. The lab work did show a new fungal infection in your bladder, so we gave you one dose of an anti-fungal medicine to treat that before you were discharged. We also changed your Foley catheter. Because you have iron deficiency anemia, we gave you some IV iron in the hospital and sent you home with oral iron tablets to take.    Medication Changes  - You have one more dose to take of the Levaquin (also called levofloxacin) antibiotic. Please take  this tomorrow morning (02/02/18).  - For the Flagyl (also called metronidazole) antibiotic, please take one dose tonight (02/01/18) before bed. You will then take this 3 times per day (morning, afternoon, and evening) on December 6th, 7th, and 8th.  - We are restarting your Lasix (water pill) at 20mg  per day. Please call your primary care doctor if your swelling worsens or does not improve, or if you develop symptoms of low blood pressure such as dizziness, lightheadedness, or weakness.  - We gave you IV iron in the hospital and are sending you home with oral iron tablets. Please take 325mg  per day.  -- Please follow the wound care instructions for the ulcer on your backside.   - Please take all other medications as prescribed and as detailed in this After Visit Summary.    What should I watch for in the next few weeks?  After you return home, it is important to monitor for worsening shortness of breath, chest pain, and fevers. Please also monitor for symptoms from anemia such as feeling tired. Also, it is important to watch out for urinary symptoms such as burning. In addition, please monitor for symptoms of increased swelling or low blood pressure such as lightheadedness, dizziness, weakness.    You have had delirium while here in the hospital. That means confused thinking that comes and goes.. It may continue for a time after you leave the hospital. Here are suggestions for how to improve this:  - Make sure to stay awake as much as possible during the day  - During the day, keep lights on and curtains open.   - During the night, keep lights out and TV off, except for a small night-light if needed for safety.   - Delirium sometimes feels scary. It helps to have someone explain over and over again what's going on, what day it is, and where you are.   - Being able to see and hear clearly can help. So if you have glasses or hearing aids, they should be kept on during waking hours.   It is important that someone stay with you in your home until the delirium is better. If delirium is lingering beyond a couple of days or getting worse anytime, contact the doctors (see below for contact information).    Who will I follow up with?  We have scheduled you follow up with these doctors  Follow up with Dr. Allena Katz on December 10 at 8:40 AM. You are also scheduled to follow up with Oncology in February 2020, as listed below.    Future Appointments   Date Time Provider Department Center   02/06/2018  8:40 AM [name], MD [facility]   04/13/2018  8:30 AM HBR CT RM 1 [facility] [place] [facility]   04/17/2018  7:30 AM ADULT ONC LAB [facility]   04/17/2018  8:30 AM Molli Barrows, MD [facility]   04/17/2018  9:00 AM Sonny Masters, MD [facility]        When you see your primary doctor, please get these labs drawn:  - CBC and BMP.    What should I do if I start to feel sick again?  If the shortness of breath, weakness, swelling, or other symptoms worsen, or if you develop fevers, we would recommend that you call your primary care doctor. In general, if you have specific questions about these instructions or your hospital stay in the next 48 hours, please call  [phone] and ask for the geriatrics doctor on call. Let them know that you were just discharged from the hospital.     For new symptoms or problems, call your primary care provider.     If you believe that this is a life-threatening emergency, call 911 or go to the emergency room.       Thank you for allowing [place] to participate in your care.

## 2018-01-30 NOTE — Unmapped (Signed)
Spoke with provider about NS fluid, waiting for provider to speak to attending before administering.

## 2018-01-30 NOTE — Unmapped (Signed)
Pt states her breathing is better. No c/o pain at this time.

## 2018-01-30 NOTE — Unmapped (Signed)
No acute events.  Pt arrived from ED just before 22:00.  BP soft but stable.  Pt afebrile.  Fluids given per sepsis protocol.  ABX given.  Pt turned Q2.  Pt has PU on sacrum, present on admission.  Pt was d/c to home yesterday, got into the car and was feeling badly, returned to ED and was readmitted. UOP adequate. Pt with foley for retention. Will continue to monitor and assess.   Problem: Adult Inpatient Plan of Care  Goal: Plan of Care Review  Outcome: Progressing  Goal: Patient-Specific Goal (Individualization)  Outcome: Progressing  Goal: Absence of Hospital-Acquired Illness or Injury  Outcome: Progressing  Goal: Optimal Comfort and Wellbeing  Outcome: Progressing  Goal: Readiness for Transition of Care  Outcome: Progressing  Goal: Rounds/Family Conference  Outcome: Progressing     Problem: Diabetes Comorbidity  Goal: Blood Glucose Level Within Desired Range  Outcome: Progressing     Problem: Skin Injury Risk Increased  Goal: Skin Health and Integrity  Outcome: Progressing     Problem: Fall Injury Risk  Goal: Absence of Fall and Fall-Related Injury  Outcome: Progressing     Problem: Self-Care Deficit  Goal: Improved Ability to Complete Activities of Daily Living  Outcome: Not Progressing

## 2018-01-30 NOTE — Unmapped (Signed)
?? 

## 2018-01-30 NOTE — Unmapped (Signed)
84 year old woman mid abdomen initially 11/24/ 2019.  Family is at bedside, patient is a poor informant.    Initial history on admission  ??  1.  Sepsis picture.  She was initially admitted with a picture of sepsis.  At that time, one blood culture grew Klebsiella and the second which are grew Bacteroides.  It seems unusual that Klebsiella would not grew out of both flasks to indicate a  true positive culture. On the other hand, Bacteroides can sometimes be difficult to culture.  Patient also had a urinary tract infection which was secondary to pansensitive Pseudomonas, and also has a pressure ulcer on her back.  She appears to responded to the combination of levofloxacin and metronidazole.  ??  2.  Metastatic cancer. She has known metastatic lung cancer with multiple lung nodules.  There is, as well mass near the duodenum which appears to be localized near the duodenum.  In addition there is a new mass in the left hepatic lobe that also appears to be metastatic.  There is also a new enhancing lesion in the inferior pole the right kidney, is also concerning for a metastasis versus a renal malignancy.  She was also shown to have a large amount of ascites, with thickened and edematous bowel.   ??  3.  Diastolic heart failure.  Patient has moderate pulmonary hypertension, moderate tricuspid regurgitation, and preserved ejection fraction.  ??  4.  Anemia.  Patient has an anemia with a hematocrit of 20, the ferritin unfortunately is unfortunately not helpful, since it is a phase reactant.  The iron strongly suggest iron deficiency and this also implies the high probability of bleeding from the mass in the duodenum which may be infiltrating the duodenal wall.  ??  ??  Hospital course    1.  Acute dyspnea.  Patient was discharged from the hospital yesterday afternoon, and the family says that the patient appeared well and was not complaining of anything.  They loaded her into the car and work getting ready to drive out of the parking lot when suddenly she said that she was short of breath and could not breathe.  He was brought back to the emergency room and blood sugars were all above 100, her blood pressure was initially elevated 150 systolic, at that point it was thought that she perhaps might have a septic picture and was started on vancomycin, metronidazole and cefepime.  She was also given large quantities of fluid.  This morning the family does not think that she is back to her usual state, and she is somewhat confused and lethargic, but she complains of no pain, and feels that her breathing is much improved.  A CTA performed in the emergency department did not show evidence of pulmonary emboli.  There is also nothing to suggest acute airspace disease.    2.  Question of recurrent sepsis.  As noted on her initial hospitalization, Klebsiella had grown out of 1 blood culture and Bacteroides out of the other.  She also had a urinary infection secondary to Pseudomonas.  Finally, she also has a active page 4 pressure ulcer over her spine this is not oozing.  The family did not note any shaking chills nor was there a recorded fever in the emergency room.    3.  Urinary retention.  Patient has a chronic indwelling Foley because of her inability to void, it is felt that this is the source of the urinary tract infection with Pseudomonas.  4.  Metastatic cancer.  As noted in initial history, has known metastatic lung cancer that appears metastatic node only to the lung but also to the liver and to the skull and perhaps the kidney.  She is currently on a tyrosine kinase inhibitor.  She has a chronic indwelling right pleural catheter to drain a malignant pleural effusion.  Is also noted were that the patient has a past history of melanoma dating back to 2006.    5.  Nutritional deficiency with hypoalbuminemia.  Appears to be secondary not only to her cancer, but also to the loss of protein from the pleural space due to drainage of the pleural catheter.  In addition she has diffuse edema.    6.  Diastolic heart failure.  The patient is known to have diastolic heart failure with pulmonary hypertension and evidence of right-sided overload.    7.  Minor cognitive disorder.  She also has minor cognitive disorder, which the family notes has suddenly worsened since yesterday.    8.  Anemia.  The patient has chronically run hematocrits of around 20, on readmission yesterday hematocrit was 25.  Because of the unreliability of ferritin during acute illness, it is assumed that she is iron deficient based on her iron panel.  More than likely she is bleeding from the duodenal mass which may be infiltrating the bowel.  This however is not proven.  This however may be worsened by the use of apixaban for problem #9.    9.  Fibrillation.  She remains on apixaban as an anticoagulant.  Her heart rate has generally been controlled in the 90s to 100 and somewhat elevated to 110-133 the emergency department.    10.  Osteoporosis.  Patient has osteoporosis with a T12 compression fracture.    11.  Diabetes.  Since her admission the patient sugars have ranged from 164-232, her most recent hemoglobin A1c this month was 6.4%.  She is currently on a sliding scale of rapid acting insulin.  In July the albumin creatinine ratio was less than 0.6.    12.  Sacral pressure ulcer.  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  This is not been observed to lose but does have significant dead tissue overlying it requiring debridement.    Past medical history.  Patient never smoked or drank.  Operations include a total melanoma, hysterectomy, cholecystectomy, and a branchial cleft cystectomy.  Family history is positive for diabetes and hypertension.  Allergies are to sulfa.  Medications have been reviewed and confirmed in the workstation.  No additions include cefepime, metronidazole and vancomycin.    Review of systems.  Patient does not feel short of breath this morning, does feel drowsy and sleepy but is not complaining of pain anywhere.  Her daughter who is with her says that she has not had any chills or not aware fever, there is been no symptoms of seizures and she yesterday was not panting even when she was stating she was short of breath.  There are no new focal neurological signs according to the family, and she appears to feel better this morning.    Initial laboratory values.  CTA scan shows no new nodules, or airspace disease nor other pulmonary emboli.  A VBG shows a pH of 7.46, PCO2 36, and oxygen level of 41.  The conference of metabolic panel is normal except for a chloride of 114, BUN of 25, albumin of 1.5, total protein 3.9,  AST of 39 and alk phos of 165.  WBC is 7.1 although it had risen to 13.4 initially.  Hematocrit once again 20.4, count 178,000.  The urinalysis shows moderate amounts of protein, large amount of blood in the urine with RBCs scattered WBCs rare bacteria and number of granular casts.  The INR is 1.85, but the patient is on apixaban.      Physical exam.  General.  The patient is awake, much more somnolent than yesterday morning, and again confused and disoriented.  There is generalized anasarca.  Vital signs.  35.7, 101 and irregular, 15, 100% on 2 L, 100/59.  Skin.  There is a 6 x 4 cm stage IV pressure ulcer of the sacrum, with dead tissue overlying, but no drainage.  This is not changed.  HEENT.  Current conjunctiva are normal, I do not feel the thyroid, neck veins are distended and increase on inspiration.  Respiratory.  There are few scattered rales at the bases, the patient really has a great deal of difficulty eating sitting up with assistance, and this exam is optimal. .  Cardiovascular.    The rhythm is mostly regular this morning, I hear no murmurs or gallops.  Abdomen.  The liver and spleen are likely of normal size, there is substantial abdominal wall edema, bowel sounds are quiet, but there are no tender areas or masses felt. GU.  Foley catheter in place  Rectal.  ND  Extremities.  Pulses are very weak, 2+ edema in the legs and there is generalized anasarca.  Musculoskeletal.  There is modest osteoarthritis of the knees and less of the hands, no joints are warm or swollen.  Psychiatric.  Patient is somnolent, and not oriented, but speaking in sentences that are relevant.  Neurological.  Cranial nerves appear intact, upper extremity strength is normal and tone is normal also.  She is able to move the lower extremities but cannot cooperate with the sensory exam.  Gait and station were not tested.  Toes are downgoing.    Impression.  1.  Sudden onset of dyspnea, cause uncertain, possibly worsening heart failure or sepsis.  2.  Past septic picture and possibly recurrent sepsis.  Multiple possible sites including bacterial peritonitis (clinical picture however does not fit), gut translocation of bacteria, duodenal mass, and more likely urinary tract infection.  3.  Metastatic non-small cell lung cancer with new deposits in the liver  4.  Periduodenal mass which could be metastatic or primary  5.  Mass in the in the lower pole of the right kidney, question metastasis or primary renal carcinoma.  6.  Iron deficiency anemia due to bleeding presumably from the duodenal mass.  5.  History Pseudomonas UTI.  6.  Suspect past contamination of blood cultures  7.  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  8.  Osteoporosis with T12 compression fracture.  9.  Diabetes mellitus  10.  Cognitive disorder with superimposed mild delirium  11.  Generalized anasarca and ascites due to albuminemia and possible sepsis picture  ??  Suggestions.  Patient appears to be generally fluid overloaded with anasarca and an increased jugular venous pressure.  Cautious diuresis I think would be appropriate here.  We can continue the antibiotics for 24 hours until we see if there are positive blood cultures or urine culture.  After that I think I would discontinue them to see what her clinical course is.  Also would begin infusion of Ferrlecit after initial 24 hours to make certain that  she is not septic.  If the cultures turn positive, it might be wise to obtain a sample of the ascites although spontaneous bacterial peritonitis in exudative peritoneal effusions is rare.  The lack of studying of the omentum or abdominal wall with nodules, however, suggests this may not be metastatic.  She will need either collagenase or an alginate based treatment for the pressure ulcer to debride it.  We also need to have a serious discussion with the family as to the possibility of initiating hospice care.

## 2018-01-30 NOTE — Unmapped (Signed)
[facility] History and Physical    Assessment/Plan:    Principal Problem:    Hypotension  Active Problems:    SOB (shortness of breath)    Diabetes mellitus, type 2 (CMS-HCC)    Metastatic lung cancer (metastasis from lung to other site), unspecified laterality (CMS-HCC)    Anemia    Pressure injury of sacral region, stage 2 (CMS-HCC)    Tachycardia  Resolved Problems:    * No resolved hospital problems. *      DARE SPILLMAN is a 84 y.o. female with PMHx stage IV NSCLC, T2DM, afib that presented to [facility] with acute onset shortness of breath when climbing into the car to discharge from the hospital today. In the ED she was found to be tachycardic to 190s initially. Now hypotensive, tachycardic concerning for recurrent sepsis. Sick on admission.     Hypotension, tachycardia: discharged earlier today after admission for Klebsiella and B fragilis bacteremia. Switched to PO abx 12/1. In ED today lactate 2.2, extremities cold, talking slowly again per daughter which is how she was when she came in with sepsis a few weeks ago. BP 100/60s, HR 110-120s down from systolic BP 160s systolic at time of discharge today with HR 60s. Differential includes recurrent sepsis, less likely cardiogenic or hypovolemic shock. Initially I was concerned for cardiogenic shock given cold extremities but this appears to have been true on admission for sepsis 2 weeks ago as well. Had a normal cortisol during this admission. WBC up from 4.7 to 13.4 in < 12 hours. switched to PO abx 12/1. Source was felt to be gut translocation in the setting of her malignant ascites and abdominal metastases therefore difficult source control and at high risk for recurrent infection. She does appear hypervolemic on exam but given she presented with similar exam previously and most recent echo shows normal LV and RV function will continue with aggressive fluid resuscitation.    - 30cc/kg LR  - blood culture x2  - urine culture  - s/p CXR which is unchanged from prior  - recheck lactate   - vancomycin, cefepime, flagyl  - confirmed with patient's daughter that they would still want aggressive care including escalation to ICU status, central line, arterial line, etc  - confirmed Full Code    Acute onset shortness of breath: sudden onset as she got in to the car to discharge from the hospital this afternoon. CTA negative for PE. Per triage vitals on arrival to ED HR 190 so possible flash pulmonary edema vs new sepsis vs pericardial effusion (small on previous echo). PleurX last drained yesterday, not due til 12/2.   - repeat TTE  - drain pleurx per schedule  - treatment for possible sepsis as below    Stage IV NSCLC, bilateral pleural effusions: pleurX drained 12/1  - pleurX drainage QOD, next 12/3  - hold crizotinib    Elevated AST: AST 148 on admission, normal at 24 on last check 11/23. Possibly sepsis vs congestive.  - treatment of sepsis above    Urinary retention: discharged with Foley in place today. Urinalysis with blood but no signs of infection.  - Foley in place    Anemia of chronic disease: hemoglobin stable at 8.6     Atrial fibrillation:   - continue home apixaban    T2DM:  - SSI       Cognitive assessment:  CAM (Confusion Assessment Method): unable to assess as patient not cooperating with questioning    FEN:  Regular  diet    Prophylaxis:  - DVT:Pt already on theraputic anticoagulation    Advance Care Planning & Code Status:  Full Code, discussed again on admission and confirmed with family    Surrogate decision maker: son, Lajuan Lines, phone number in chart    Disposition:  Stepdown status, Med A    Emergency Contact:  Extended Emergency Contact Information  Primary Emergency Contact: Radovich,Jessie   [place]  Home Phone: [phone]  Mobile Phone: [phone]  Relation: Son  Secondary Emergency Contact: Viviann Spare  Mobile Phone: [phone]  Relation: Daughter ___________________________________________________________________    Chief Complaint:  Chief Complaint   Patient presents with   ??? Shortness of Breath     Hypotension    PCP: [name], MD    HPI:  LUXE CUADROS is a 84 y.o. female with PMHx stage IV NSCLC, T2DM, afib, PE that presented to [facility] with acute onset shortness of breath. The patient was discharged from Med A this afternoon after an admission for GNR bacteremia with Klebsiella and Bacteroides. As she was being carried into the car she suddenly said I can't breathe. A code medic was called but then they were told to drive around to the ED. They drove around building and she presented to the ED. Reportedly her HR in triage was 190 and she was very ill appearing. Per ED looked like she was going to code. HR and symptoms reportedly improved over the next little while (family is not sure how long) without intervention. She underwent CTA which was negative for PE. CXR essentially unchanged from prior. Patient called for admission back to med A team.    On my assessment patient tachycardic to 120s and hypotensive to 90/50s, improving slowly with IVF at 125 ml/hr. Per daughter patient speaking slowly which was a sign before that she was sick. Repeat labs significant for WBC jump from 4 to 13 since 6am. Afebrile. The family said they felt like she was acting normally before she came downstairs to be discharged.     Allergies:  Sulfasalazine    Medications:   Prior to Admission medications    Medication Dose, Route, Frequency   ascorbic acid, vitamin C, (VITAMIN C) 500 MG tablet 500 mg, Oral, Daily (standard)   blood sugar diagnostic (GLUCOSE BLOOD) Strp Check blood sugar 4 times daily.   blood sugar diagnostic Strp Check 4x/day as instructed   blood-glucose meter kit Use as instructed   blood-glucose meter Misc 1 each, Miscellaneous, 4 times a day   calcium carbonate-vitamin D3 (CALTRATE 600 + D) 600 mg (1,500 mg)-800 unit Chew 1 tablet, Oral, 2 times a day (standard)   crizotinib (XALKORI) 250 mg capsule 250 mg, Oral, 2 times a day (standard)   ELIQUIS 5 mg Tab 5 mg, Oral, 2 times a day (standard), .   insulin aspart protamine-insulin aspart (NOVOLOG MIX 70-30FLEXPEN U-100) 100 unit/mL (70-30) injection pen 8 Units, Subcutaneous, 2 times a day   lancets (ACCU-CHEK SOFTCLIX LANCETS) Misc Use 4 times daily.   lancets Misc 1 each, Miscellaneous, 4 times a day   levoFLOXacin (LEVAQUIN) 500 MG tablet 500 mg, Oral, Every 24 hours   lidocaine 4 % patch 1 patch, Transdermal, Daily PRN   metFORMIN (GLUCOPHAGE-XR) 500 MG 24 hr tablet 1,000 mg, Oral, 2 times a day   metroNIDAZOLE (FLAGYL) 500 MG tablet 500 mg, Oral, 3 times a day (standard)   mirtazapine (REMERON) 7.5 MG tablet 7.5 mg, Oral, Nightly   pen needle,  diabetic (BD ULTRA-FINE SHORT PEN NEEDLE) 31 gauge x 5/16 Ndle USE AS DIRECTED. USE 4 NEEDLES A DAY WITH DIABETIC MEDICATION PENS AS DIRECTED   potassium chloride SA (K-DUR,KLOR-CON) 20 MEQ tablet 10 mEq, Oral, 2 times a day (standard), 1/2 tablet = 10 mEq   ranitidine (ZANTAC) 150 MG tablet 150 mg, Oral, 2 times a day (standard)   sodium hypochlorite (DAKIN'S, QUARTER STRENGTH,) 0.125 % Soln Topical, 2 times a day (standard)       Medical History:  Past Medical History:   Diagnosis Date   ??? Atrial fibrillation (CMS-HCC)    ??? Cancer (CMS-HCC)    ??? Diabetes mellitus (CMS-HCC)    ??? Hypertension    ??? Metastatic lung cancer (metastasis from lung to other site) (CMS-HCC)    ??? Pulmonary embolism (CMS-HCC)        Surgical History:  Past Surgical History:   Procedure Laterality Date   ??? CHG RAD GUIDED,PERCUT DRAINAGE,W/CATH PLACE Right 06/19/2017    Procedure: RADIOLOGICAL GUIDANCE, FOR PERCUTANEOUS DRAINAGE, WITH PLACEMENT OF CATHETER, RAD S&I;  Surgeon: Sherwood Gambler, MD;  Location: BRON[facility] PROCEDURE LAB [facility];  Service: Pulmonary   ??? CHOLECYSTECTOMY     ??? HYSTERECTOMY     ??? neck surgey     ??? PR INSERTION INDWELLING TUNNELED PLEURAL CATHETER Bilateral 06/19/2017    Procedure: INSERTION OF INDWELLING TUNNELED PLEURAL CATHETER WITH CUFF WITH MODERATE SEDATION;  Surgeon: Sherwood Gambler, MD;  Location: BRON[facility] PROCEDURE LAB [facility];  Service: Pulmonary   ??? PR INSERTION INDWELLING TUNNELED PLEURAL CATHETER Left 10/26/2017    Procedure: INSERTION OF INDWELLING TUNNELED PLEURAL CATHETER WITH CUFF;  Surgeon: Mercy Moore, MD;  Location: MAIN OR [facility];  Service: Pulmonary   ??? PR MOD SED SAME PHYS/QHP EACH ADDL 15 MINS  06/19/2017    Procedure: MODERATE SEDATION SERVICES PROVIDED BY SAME PHYSICIAN OR OTHER QUALIFIED HEALTH CARE PROFESSIONAL PERFORMING THE DIAGNOSTIC OR THERAPEUTIC SERVICE THAT SEDATION SUPPORTS, EACH ADDITIONAL 15 MINS;  Surgeon: Sherwood Gambler, MD;  Location: [facility]ON[facility] PROCEDURE LAB [facility];  Service: Pulmonary   ??? PR MOD SED SAME PHYS/QHP INITIAL 15 MINS 5/> YRS  06/19/2017    Procedure: MODERATE SEDATION SERVICES PROVIDED BY SAME PHYSICIAN OR OTHER QUALIFIED HC PROFESSIONAL PERFORMING THE DIAGNOSTIC OR THERAPEUTIC SERVICE THAT SEDATION SUPPORTS, INIT 15 MINS, PT AGE 66 YEARS OR OLDER;  Surgeon: Sherwood Gambler, MD;  Location: BRONCH PROCEDURE LAB [facility];  Service: Pulmonary       Social History:  Social History     Socioeconomic History   ??? Marital status: Widowed     Spouse name: Not on file   ??? Number of children: Not on file   ??? Years of education: Not on file   ??? Highest education level: Not on file   Occupational History   ??? Not on file   Social Needs   ??? Financial resource strain: Not on file   ??? Food insecurity:     Worry: Not on file     Inability: Not on file   ??? Transportation needs:     Medical: Not on file     Non-medical: Not on file   Tobacco Use   ??? Smoking status: Never Smoker   ??? Smokeless tobacco: Never Used   Substance and Sexual Activity   ??? Alcohol use: No   ??? Drug use: Never   ??? Sexual activity: Not on file   Lifestyle   ??? Physical activity:     Days per week:  Not on file     Minutes per session: Not on file   ??? Stress: Not on file   Relationships   ??? Social connections:     Talks on phone: Not on file     Gets together: Not on file     Attends religious service: Not on file     Active member of club or organization: Not on file     Attends meetings of clubs or organizations: Not on file     Relationship status: Not on file   Other Topics Concern   ??? Do you use sunscreen? No   ??? Tanning bed use? No   ??? Are you easily burned? No   ??? Excessive sun exposure? No   ??? Blistering sunburns? No   Social History Narrative   ??? Not on file       *See geriatric assessment from discharge summary with same date    Family History:  Family History   Problem Relation Age of Onset   ??? Cancer Brother         lung   ??? Cancer Daughter         lung   ??? Cancer Daughter         breast       Review of Systems:  10 systems reviewed and are negative unless otherwise mentioned in HPI    Labs/Studies:  Labs and Studies from the last 24hrs per EMR and Reviewed    Physical Exam:  Temp:  [35.7 ??C-36.4 ??C] 36.4 ??C  Heart Rate:  [54-123] 113  SpO2 Pulse:  [65-95] 90  Resp:  [13-22] 15  BP: (93-165)/(62-124) 98/65  SpO2:  [98 %-100 %] 99 %    GEN: chronically ill appearing but in no acute distress, speech slow and sometimes difficult to understand  EYES: PERRL  ENT: MMM  CV: tachycardic, no murmurs appreciated, ?JVD 2 cm above clavicle at 45 degrees (difficult exam as patient not cooperative with turning her neck, telling me you go lay down)  PULM: CTA B, normal work of breathing  ABD: soft, NT/ND, +BS  EXT: 2+ pitting edema of bilateral upper and lower extremities, L > R swelling in lower extremities. Hands and feet cool to touch.  NEURO: alert, refusing to answer most questions including orientation questions, per daughter is mad because people keep asking her these questions, moving all extremities, speech slow and slightly slurred, facial expressions symmetric

## 2018-01-30 NOTE — Unmapped (Signed)
Pt complains of sob that started one hour prior to arrival. Pt discharged from this hospital today. Pt was admitted for 9 days. Sats 88% on RA and HR 190 on monitor.

## 2018-01-30 NOTE — Unmapped (Signed)
[facility] Emergency Department Provider Note      ED Clinical Impression     Final diagnoses:   SOB (shortness of breath) (Primary)   Hypoxia   Tachycardia   Anemia, unspecified type   Weakness       Initial Impression, ED Course, Assessment and Plan     84 y.o. 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    Plan for basic labs, blood cultures, VBG, coags, lactate, pro-BNP, troponin, magnesium, UA with culture, EKG, and chest XR.    5:07 PM  Blood sugar 232. BP 157/112. Heart rate 110 bpm. O2 sat 97%. Temp 97.89F.    5:10 PM  Repeat EKG with A-fib with RVR. Ventricular rate 116. 1 PVC. Nonspecific ST and T wave changes. Some artifact.    5:19 PM  Magnesium WNL. VBG with lactate elevated to 2.2. Chest XR with impression:  --Small left apical pneumothorax.   --Nondisplaced left sixth rib fracture and minimally displaced right fifth rib fracture.  --Hazy bibasilar opacities, greater on the left with small left and trace right pleural effusions.    5:26 PM  CMP with anion gap <1, AST elevated to 148, glucose 214, albumin 1.8, and alk phos 210.    5:30 PM  Family states that the patient looks much different than she did in the room prior to discharge. They state that her cheeks were red and now she looks pale and weak, which is a significant difference from how she looked at the time of her discharge.    5:50 PM  Troponin negative. PT elevated to 21.6, other coags normal.    6:15 PM  CBC with slight leukocytosis to 13.4, Hgb 8.6 (improved from 7.6 this morning), absolute neutrophils elevated to 8.6, otherwise unremarkable. CTA chest with impression:  --No pulmonary embolism.  --Unchanged size and appearance of bilateral upper lobe pulmonary masses.  --Small left and moderate right pleural effusion with tunneled pleural drain is in place. Bibasilar atelectasis.  --No new sites of metastatic disease are identified.  --Moderate abdominal ascites.    6:18 PM  Heart rate in the 90s. BP 117/63. O2 sat 94% on 2L.     6:48 PM  UA with large blood, no sign of infection.    7:21 PM  Pro-BNP elevated to 1,600. Will page MAO for admission.     Labs     Labs Reviewed   COMPREHENSIVE METABOLIC PANEL - Abnormal; Notable for the following components:       Result Value    Chloride 113 (*)     BUN 25 (*)     Glucose 214 (*)     Albumin 1.8 (*)     Total Protein 4.7 (*)     AST 148 (*)     Alkaline Phosphatase 210 (*)     Anion Gap <1 (*)     All other components within normal limits   URINALYSIS WITH CULTURE REFLEX - Abnormal; Notable for the following components:    Protein, UA 100 mg/dL (*)     Ketones, UA Trace (*)     Blood, UA Large (*)     RBC, UA 23 (*)     Bacteria, UA Rare (*)  Granular Casts, UA 30 (*)     All other components within normal limits    Narrative:     Unspun microscopic                     PRO-BNP - Abnormal; Notable for the following components:    PRO-BNP 1,600.0 (*)     All other components within normal limits   BLOOD GAS CRITICAL CARE PANEL, VENOUS - Abnormal; Notable for the following components:    pH, Venous 7.31 (*)     pO2, Ven 24 (*)     O2 Saturation, Venous 27.0 (*)     Lactate, Venous 2.2 (*)     All other components within normal limits   PROTIME-INR - Abnormal; Notable for the following components:    PT 21.6 (*)     All other components within normal limits   POCT GLUCOSE, INTERFACED - Abnormal; Notable for the following components:    Glucose, POC 232 (*)     All other components within normal limits   CBC W/ AUTO DIFF - Abnormal; Notable for the following components:    WBC 13.4 (*)     RBC 3.36 (*)     HGB 8.6 (*)     HCT 25.5 (*)     MCV 76.0 (*)     MCH 25.7 (*)     RDW 24.9 (*)     Variable HGB Concentration Slight (*) Microcytosis Marked (*)     Anisocytosis Marked (*)     Hyperchromasia Slight (*)     All other components within normal limits    Narrative:     Please use the Absolute Differential for reference ranges.    MANUAL DIFFERENTIAL - Abnormal; Notable for the following components:    Absolute Neutrophils 8.6 (*)     Absolute Monocytes 0.9 (*)     Smear Review Comments See Comment (*)     Polychromasia Slight (*)     Target Cells Moderate (*)     All other components within normal limits    Narrative:     Slide reviewed   TROPONIN I - Normal   MAGNESIUM - Normal   BLOOD CULTURE   URINE CULTURE   APTT   EXTRA TUBES    Narrative:     The following orders were created for panel order ED Extra Tubes.                  Procedure                               Abnormality         Status                                     ---------                               -----------         ------                                     GOLD Lindaann Slough ZOXW[[id number]]  Final result                                                 Please view results for these tests on the individual orders.   CBC W/ DIFFERENTIAL    Narrative:     The following orders were created for panel order CBC w/ Differential.                  Procedure                               Abnormality         Status                                     ---------                               -----------         ------                                     CBC w/ [id number]]         Abnormal            Final result                               Manual Differential[[id number]]         Abnormal            Final result                                                 Please view results for these tests on the individual orders.   CBC W/ DIFFERENTIAL    Narrative:     The following orders were created for panel order CBC w/ Differential.                  Procedure                               Abnormality         Status ---------                               -----------         ------                                     CBC w/ Differential[[id number]]  Please view results for these tests on the individual orders.   TROPONIN I   TYPE AND SCREEN   GOLD SST EXTRA TUBE   CBC W/ AUTO DIFF       Radiology     Xr Chest Portable    Result Date: 01/29/2018  EXAM: XR CHEST PORTABLE DATE: 01/29/2018 5:02 PM ACCESSION: [id number] UN DICTATED: 01/29/2018 5:03 PM INTERPRETATION LOCATION: [facility] CLINICAL INDICATION: 84 years old Female with SHORTNESS OF BREATH  COMPARISON: 01/22/2018 TECHNIQUE: Portable Chest Radiograph. FINDINGS: Defibrillator pad overlies the right chest wall. Unchanged left-sided chest tube. Right-sided chest tube has migrated inferiorly with distal tubing lying over the right hemidiaphragm. Streaky bibasilar opacities, greater on the left. Small left and likely trace right pleural effusions. Trace left pneumothorax. No right pneumothorax. Stable cardiomediastinal silhouette. Multiple left-sided rib fractures are again visualized.     Small left apical pneumothorax. Low lung volumes with hazy bibasilar opacities, greater on the left with small left and trace right pleural effusions.    Cta Chest W Contrast    Result Date: 01/29/2018  EXAM: CTA CHEST W CONTRAST DATE: 01/29/2018 5:55 PM ACCESSION: [id number] UN DICTATED: 01/29/2018 6:04 PM INTERPRETATION LOCATION: [facility] CLINICAL INDICATION: 84 years old Female with SOB  COMPARISON: Same day chest radiograph, chest CT 01/12/2018 TECHNIQUE: A spiral CTA scan was obtained with IV contrast from the lung apices to the lung bases. Images were reconstructed in the axial plane. Multiplanar reformatted and MIP images were provided. FINDINGS: LINES: Tunneled bilateral pleural catheters. PULMONARY ARTERIES: No pulmonary embolism, however exam is significantly degraded by motion. AIRWAYS, LUNGS, PLEURA: Clear central tracheobronchial tree.  Left basilar atelectasis. Small right and moderate left pleural effusions. Unchanged appearance of left upper lobe mass with evidence of endobronchial invasion (5:41). Unchanged appearance of right upper lobe pulmonary nodule (5:40). No new focal consolidative opacities. No pneumothorax MEDIASTINUM: Normal heart size. No pericardial effusion.  Normal caliber thoracic aorta. Aortic arch and coronary artery calcifications. No mediastinal lymphadenopathy. IMAGED ABDOMEN: Moderate volume ascites. SOFT TISSUES/BONES: Similar appearance of multinodular goiter. Unchanged lipoma along the right chest wall. Redemonstrated healing left third through 10th rib fractures no new fractures are identified. Moderate bilateral shoulder osteoarthrosis. Moderate multilevel degenerative disease of the thoracolumbar spine. Unchanged deformity of the T12 vertebral body.     --No pulmonary embolism. --Unchanged size and appearance of bilateral upper lobe pulmonary masses. --Small left and moderate right pleural effusion with tunneled pleural drain is in place. Bibasilar atelectasis. --No new sites of metastatic disease are identified. --Moderate abdominal ascites.    Ed Pocus Rush Protocol Emergency    Result Date: 01/29/2018  Limited Cardiac Ultrasound (CPT [phone]) Indication: A focused ultrasound exam of the heart was performed to evaluate for pericardial effusion, tamponade, severe hypovolemia, or gross abnormalities of cardiac anatomy or function in this patient. The ultrasound was performed with the following indications, as noted in the H&P: Dyspnea Identified structures: The pericardial sac, myocardium, and 4 chambers were identified using the following views: parasternal long axis, parasternal short axis, apical 4-chamber and IVC (long axis) Findings: Exam of the above structures revealed the following findings:  Pericardial effusion: Present  with a SMALL effusion Pericardial tamponade: N/A  Global LV function: Normal  Right ventricular size: Normal   Signs of RV strain: N/A  IVC: Normal  Other findings: NA Limitations: None. Impression: No sonographic evidence of significant cardiac dysfunction and No sonographic evidence of significant pericardial effusion Other: NA Interpreted by: Raynald Blend, MD  History     Chief Complaint  Shortness of Breath      HPI   Patient was seen by me at 4:39 PM.    Patient is a 84 y.o. 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    Patient reports that she has had shortness of breath that began 1 hour PTA. Denies chest pain, stating it is just difficult to take a breath. Patient reports that she has felt cold since her discharge. Endorses left lower extremity swelling, worsened from baseline today. Denies headache. Patient's son reports that the patient was discharged today from this hospital. He reports that they had difficulty getting the patient in the car, but once they did, the patient then started complaining of shortness of breath. He states that the patient has been able to talk since her discharge and has not had any loss of consciousness. He reports that the patient has had increased, generalized weakness from her baseline. Patient's son reports that the patient's code status is Full Code.    Per chart review, patient was treated with vancomycin, cefepime, and metronidazole during the course of her admission. She was discharged today with a 2-week course of oral metronidazole and oral levofloxacin. Patient's H&H at time of discharge was 7.6 and 22. Patient's family reports that the patient had a blood transfusion around 3 weeks ago.    Previous chart, nursing notes, and vital signs reviewed.      Pertinent labs & imaging results that were available during my care of the patient were reviewed by me and considered in my medical decision making (see chart for details).    Past Medical History:   Diagnosis Date   ??? Atrial fibrillation (CMS-HCC)    ??? Cancer (CMS-HCC)    ??? Diabetes mellitus (CMS-HCC)    ??? Hypertension    ??? Metastatic lung cancer (metastasis from lung to other site) (CMS-HCC)    ??? Pulmonary embolism (CMS-HCC)        Past Surgical History:   Procedure Laterality Date   ??? CHG RAD GUIDED,PERCUT DRAINAGE,W/CATH PLACE Right 06/19/2017    Procedure: RADIOLOGICAL GUIDANCE, FOR PERCUTANEOUS DRAINAGE, WITH PLACEMENT OF CATHETER, RAD S&I;  Surgeon: Sherwood Gambler, MD;  Location: BRONCH PROCEDURE LAB [facility];  Service: Pulmonary   ??? CHOLECYSTECTOMY     ??? HYSTERECTOMY     ??? neck surgey     ??? PR INSERTION INDWELLING TUNNELED PLEURAL CATHETER Bilateral 06/19/2017    Procedure: INSERTION OF INDWELLING TUNNELED PLEURAL CATHETER WITH CUFF WITH MODERATE SEDATION;  Surgeon: Sherwood Gambler, MD;  Location: BRON[facility] PROCEDURE LAB [facility];  Service: Pulmonary   ??? PR INSERTION INDWELLING TUNNELED PLEURAL CATHETER Left 10/26/2017    Procedure: INSERTION OF INDWELLING TUNNELED PLEURAL CATHETER WITH CUFF;  Surgeon: Mercy Moore, MD;  Location: MAIN OR [facility];  Service: Pulmonary   ??? PR MOD SED SAME PHYS/QHP EACH ADDL 15 MINS  06/19/2017    Procedure: MODERATE SEDATION SERVICES PROVIDED BY SAME PHYSICIAN OR OTHER QUALIFIED HEALTH CARE PROFESSIONAL PERFORMING THE DIAGNOSTIC OR THERAPEUTIC SERVICE THAT SEDATION SUPPORTS, EACH ADDITIONAL 15 MINS;  Surgeon: Sherwood Gambler, MD;  Location: BRONCH PROCEDURE LAB [facility];  Service: Pulmonary   ??? PR MOD SED SAME PHYS/QHP INITIAL 15 MINS 5/> YRS  06/19/2017    Procedure: MODERATE SEDATION SERVICES PROVIDED BY SAME PHYSICIAN OR OTHER QUALIFIED HC PROFESSIONAL PERFORMING THE DIAGNOSTIC OR THERAPEUTIC SERVICE THAT SEDATION SUPPORTS, INIT 15 MINS, PT AGE 37 YEARS OR OLDER;  Surgeon: Sherwood Gambler, MD;  Location: BRONCH PROCEDURE LAB [facility];  Service: Pulmonary         Current Facility-Administered Medications:   ???  sodium chloride (NS) 0.9 % infusion, 125 mL/hr, Intravenous, Continuous, Modesta Messing, PA, Last Rate: 125 mL/hr at 01/29/18 2002, 125 mL/hr at 01/29/18 2002    Current Outpatient Medications:   ???  ascorbic acid, vitamin C, (VITAMIN C) 500 MG tablet, Take 1 tablet (500 mg total) by mouth daily., Disp: 30 tablet, Rfl: 11  ???  blood sugar diagnostic (GLUCOSE BLOOD) Strp, Check blood sugar 4 times daily., Disp: 100 each, Rfl: 3  ???  blood sugar diagnostic Strp, Check 4x/day as instructed, Disp: 120 strip, Rfl: 5  ???  blood-glucose meter kit, Use as instructed, Disp: 1 each, Rfl: 0  ???  blood-glucose meter Misc, 1 each by Miscellaneous route Four (4) times a day. for 1 day, Disp: 1 each, Rfl: 1  ???  calcium carbonate-vitamin D3 (CALTRATE 600 + D) 600 mg (1,500 mg)-800 unit Chew, Chew 1 tablet Two (2) times a day., Disp: , Rfl:   ???  crizotinib (XALKORI) 250 mg capsule, Take 1 capsule (250 mg total) by mouth Two (2) times a day., Disp: 60 capsule, Rfl: 3  ???  ELIQUIS 5 mg Tab, Take 5 mg by mouth Two (2) times a day. ., Disp: , Rfl: 3  ???  insulin aspart protamine-insulin aspart (NOVOLOG MIX 70-30FLEXPEN U-100) 100 unit/mL (70-30) injection pen, Inject 0.08 mL (8 Units total) under the skin two (2) times a day., Disp: 15 mL, Rfl: 0  ???  lancets (ACCU-CHEK SOFTCLIX LANCETS) Misc, Use 4 times daily., Disp: 100 each, Rfl: 3  ???  lancets Misc, 1 each by Miscellaneous route Four (4) times a day., Disp: 200 each, Rfl: 4  ???  levoFLOXacin (LEVAQUIN) 500 MG tablet, Take 1 tablet (500 mg total) by mouth daily. for 4 days, Disp: 4 tablet, Rfl: 0  ???  lidocaine 4 % patch, Place 1 patch on the skin daily as needed. , Disp: , Rfl:   ???  metFORMIN (GLUCOPHAGE-XR) 500 MG 24 hr tablet, TAKE 2 TABLETS (1,000 MG TOTAL) BY MOUTH TWO (2) TIMES A DAY., Disp: 360 tablet, Rfl: 3  ??? metroNIDAZOLE (FLAGYL) 500 MG tablet, Take 1 tablet (500 mg total) by mouth Three (3) times a day. for 8 days, Disp: 24 tablet, Rfl: 0  ???  mirtazapine (REMERON) 7.5 MG tablet, Take 1 tablet (7.5 mg total) by mouth nightly., Disp: 30 tablet, Rfl: 3  ???  pen needle, diabetic (BD ULTRA-FINE SHORT PEN NEEDLE) 31 gauge x 5/16 Ndle, USE AS DIRECTED. USE 4 NEEDLES A DAY WITH DIABETIC MEDICATION PENS AS DIRECTED, Disp: , Rfl:   ???  potassium chloride SA (K-DUR,KLOR-CON) 20 MEQ tablet, Take 10 mEq by mouth Two (2) times a day. 1/2 tablet = 10 mEq, Disp: , Rfl:   ???  ranitidine (ZANTAC) 150 MG tablet, Take 150 mg by mouth Two (2) times a day. , Disp: , Rfl:   ???  sodium hypochlorite (DAKIN'S, QUARTER STRENGTH,) 0.125 % Soln, Apply topically Two (2) times a day., Disp: 473 mL, Rfl: 0    Allergies  Sulfasalazine    Family History   Problem Relation Age of Onset   ??? Cancer  Brother         lung   ??? Cancer Daughter         lung   ??? Cancer Daughter         breast       Social History  Social History     Tobacco Use   ??? Smoking status: Never Smoker   ??? Smokeless tobacco: Never Used   Substance Use Topics   ??? Alcohol use: No   ??? Drug use: Never       Review of Systems  Constitutional: Positive for chills. Negative for fever.  Eyes: Negative for visual changes.  ENT: Negative for sore throat.  Cardiovascular: Negative for chest pain.  Respiratory: Positive for shortness of breath.  Gastrointestinal: Negative for abdominal pain, vomiting or diarrhea.  Genitourinary: Negative for dysuria.  Musculoskeletal: Positive for lower extremity swelling. Negative for back pain.  Skin: Negative for rash.  Neurological: Negative for headaches, focal weakness or numbness.      Physical Exam     VITAL SIGNS:    Vitals:    01/29/18 1930 01/29/18 2000 01/29/18 2025 01/29/18 2030   BP: 121/100 93/68  116/80   Pulse: 102 104 108 108   Resp: 13 14 15 14    Temp:       TempSrc:       SpO2:   100%      Constitutional: Alert and oriented to person, place. Frail/cachectic, pale, tachypneic. Chronically ill appearing. Shaking and cool to the touch but not diaphoretic.  Eyes: Conjunctivae are normal. PERRLA. EOMs intact.   ENT       Head: Normocephalic and atraumatic.       Nose: No congestion. No epistaxis       Mouth/Throat: Mucous membranes are moist without lesions/ulcerations. Posterior oropharynx patent.       Neck: No stridor. Full ROM without pain.  Hematological/Lymphatic/Immunilogical: No cervical lymphadenopathy.  Cardiovascular: Irregularly irregular rhythm. Tachycardic with a rate of 115. Normal and symmetric distal pulses are present in all extremities.  Respiratory: Tachypneic. Breath sounds are normal in the upper lung fields. Diminished breath sounds in the bases. No wheezse, crackles, or rales.  Gastrointestinal: Well healed scar at the RUQ. Normal, active BS. Abdomen is soft and nontender.  Genitourinary: Foley catheter in place which is draining dark yellow urine.  Musculoskeletal: Diffuse edema to the right arm from the elbow and distal. Edema to the left lower extremity. See media in chart.  Neurologic: Normal speech and language. Cranial nerves II-XII intact.  Skin: Skin is warm, dry and intact. No rash noted.  Psychiatric: Mood and affect are normal. Speech and behavior are normal.     EKG     16:49 atrial fibrillation with RVR and a ventricular rate of 123 bpm with artifact, low voltage and nonspecific ST and T wave changes.    Documentation assistance was provided by Pamala Duffel, Scribe, on January 29, 2018 at 4:39 PM for Modesta Messing, [place].    January 29, 2018 7:28 PM. Documentation assistance provided by the scribe. I was present during the time the encounter was recorded. The information recorded by the scribe was done at my direction and has been reviewed and validated by me.      Modesta Messing, [place]  01/30/18 [phone]

## 2018-01-30 NOTE — Unmapped (Signed)
Care Management  Initial Transition Planning Assessment              General  Care Manager assessed the patient by : Medical record review, Telephone conversation with family, Discussion with Clinical Care team  Orientation Level: Disoriented to place, Disoriented to time  Who provides care at home?: Other (Comment)(self care)  Reason for referral: Discharge Planning, Psychosocial/Community Resource Concerns    Contact/Decision Maker  Extended Emergency Contact Information  Primary Emergency Contact: Miao,Jessie   [place]  Home Phone: [phone]  Mobile Phone: [phone]  Relation: Son  Secondary Emergency Contact: Viviann Spare  Mobile Phone: [phone]  Relation: Daughter    Legal Next of Kin / Guardian / POA / Advance Directives     HCDM (HCPOA): Coombs,Jessie - Son [phone]    Advance Directive (Medical Treatment)  Does patient have an advance directive covering medical treatment?: Patient does not have advance directive covering medical treatment., Patient would not like information.  Reason patient does not have an advance directive covering medical treatment:: Patient does not wish to complete one at this time  Reason there is not a Health Care Decision Maker appointed:: Patient does not wish to appoint a Health Care Decision Maker at this time  Information provided on advance directive:: No  Patient requests assistance:: No    Advance Directive (Mental Health Treatment)  Does patient have an advance directive covering mental health treatment?: Patient does not have advance directive covering mental health treatment., Patient would not like information.  Reason patient does not have an advance directive covering mental health treatment:: Patient needs follow-up to complete one.    Patient Information  Lives with: Children    Type of Residence: Private residence        Location/Detail: [address] [place] [address] this is a single story hoe iwth 3 STE front and 4 STE back has HR    Support Systems: Family Members, Children    Responsibilities/Dependents at home?: Yes (Describe)(82 yo and 61 yo grandsons)    Home Care services in place prior to admission?: Yes  Type of Home Care services in place prior to admission: Home PT, Home nursing visits  Current Home Care provider (Name/Phone #): [facility] open to PT/RN upon d/c 12/2 services were changed based on pt request to [facility], yet services were not initiated since pt readmitted.     Outpatient/Community Resources in place prior to admission: Clinic  Agency detail (Name/Phone #): [facility] [phone]    Equipment Currently Used at Home: cane, straight, walker, rolling, glucometer  Current HME Agency (Name/Phone #): pt owns    Currently receiving outpatient dialysis?: No       Financial Information       Need for financial assistance?: No       Social Determinants of Health  Social Determinants of Health were addressed in provider documentation.  Please refer to patient history.    Discharge Needs Assessment  Concerns to be Addressed: care coordination/care conferences, adjustment to diagnosis/illness, coping/stress    Clinical Risk Factors: > 65, Principal Diagnosis: Cancer, Stroke, COPD, Heart Failure, AMI, Pneumonia, Joint Replacment, Readmission < 30 Days, Multiple Diagnoses (Chronic), Poor Health Literacy, Lives Alone or Absence of Caregiver to Assist with Discharge and Home Care    Barriers to taking medications: No    Prior overnight hospital stay or ED visit in last 90 days: Yes    Readmission Within the Last 30 Days: previous discharge plan unsuccessful  Anticipated Changes Related to Illness: inability to care for self, inability to care for someone else    Equipment Needed After Discharge: other (see comments)(TBD)    Discharge Facility/Level of Care Needs: nursing facility, skilled    Readmission  Risk of Unplanned Readmission Score: UNPLANNED READMISSION SCORE: 32%  Predictive Model Details           33% (High) Factors Contributing to Score   Calculated 01/30/2018 14:41 32% Number of ED visits in last six months is 8   [facility] Risk of Unplanned Readmission Model 13% Number of active Rx orders is 27     10% Number of hospitalizations in last year is 3     7% Diagnosis of cancer is present     7% ECG/EKG order is present in last 6 months     6% Latest BUN is high (25 mg/dL)     5% Age is 84     5% Imaging order is present in last 6 months     Readmitted Within the Last 30 Days? (No if blank) Yes  Patient at risk for readmission?: Yes    Discharge Plan  Screen findings are: Discharge planning needs identified or anticipated (Comment).    Expected Discharge Date: 02/02/18    Expected Transfer from Critical Care:      Patient and/or family were provided with choice of facilities / services that are available and appropriate to meet post hospital care needs?: Yes   List choices in order highest to lowest preferred, if applicable. : [facility] with [facility] will readdress once team has approached advanced planning conversations.    Initial Assessment complete?: Yes

## 2018-01-30 NOTE — Unmapped (Signed)
WOCN Consult Services                                                 Wound Evaluation: Pressure Injury    Reason for Consult:   - Lower Extremity Ulcer  - Initial  - Pressure Injury    Problem List:   Principal Problem:    Hypotension  Active Problems:    SOB (shortness of breath)    Diabetes mellitus, type 2 (CMS-HCC)    Metastatic lung cancer (metastasis from lung to other site), unspecified laterality (CMS-HCC)    Anemia    Pressure injury of sacral region, stage 2 (CMS-HCC)    Tachycardia    Assessment: [name] [name] is a 84 y.o. female with PMHx of metastatic NSCLC (Stage IV, on crizotinib) c/b b/l malignant effusions s/p bilateraly pleurX catheters, recent hospitalization earlier this month for cellulitis, hx multiple LE ulcers, AFib on elequis, that presented to [facility] with fever and weakness.     Pt with deep unstageable PI that is stable and does not appear infected, but still high risk for infection due to location and severity of wound. I debrided necrotic tissue from wound base at bedside but still 100% slough to bed. At this time we discussed plan w NP and will utilize Santyl moist dressings to help penetrate slough.    Pt with metastatic cancer and does not have building blocks for healing at this time so key is prevention of infection. Will cleanse w VASHE wound cleanser and then use the Santyl daily.      I spoke with pt and family regarding plan and RN/LIPs. Pt to turned L and R only and heels offloaded. Pt also with a small eschar to the left lateral malleolus area from unknown etiology-stable eschar here. Will recommend moist wound healing with wound gel.     L and R turning only, heel elevation recommended       12/3 sacrum  ??     Pre debridement  11/26      Post debridement 11/26       01/30/18 1100   Wound 01/23/18 Other (comment) Ankle Left;Outer   Date First Assessed/Time First Assessed: 01/23/18 1600   Present on Hospital Admission: Yes  Primary Wound Type: (c) Other (comment)  Location: Ankle  Wound Location Orientation: Left;Outer   Dressing Status      Changed   Wound Length (cm) 1 cm   Wound Width (cm) 2 cm   Wound Depth (cm) 0.1 cm   Wound Surface Area (cm^2) 2 cm^2   Wound Volume (cm^3) 0.2 cm^3   Wound Bed Black   Peri-wound Assessment      Intact   Exudate Amnt      None   Treatments Cleansed/Irrigation   Dressing Wound gel;Dry gauze   Wound 01/20/18 Pressure Injury Sacrum Mid Unstageable   Date First Assessed/Time First Assessed: 01/20/18 0000   Present on Hospital Admission: Yes  Primary Wound Type: Pressure Injury  Location: Sacrum  Wound Location Orientation: Mid  Staging: Unstageable  Medical Device Related Pressure Injury: No  Stag...   Dressing Status      Changed   Wound Length (cm) 7 cm   Wound Width (cm) 4.9 cm   Wound Depth (cm) 0.7 cm   Wound Surface Area (cm^2) 34.3 cm^2   Wound Volume (cm^3)  24.01 cm^3   Wound Healing % -83   Wound Bed Yellow;Black   Peri-wound Assessment      Intact;Induration   Exudate Type      Yellow   Exudate Amnt      Scant   Treatments Cleansed/Irrigation   Dressing Moist to moist;Other (Comment)  (santyl )   Wound 12/14/17 Skin Tear Knee Left   Date First Assessed: 12/14/17   Primary Wound Type: Skin Tear  Location: Knee  Wound Location Orientation: Left   Dressing Status      Clean   Wound Bed Brown   Peri-wound Assessment      Hyperpigmentation   Exudate Amnt      None   Treatments Cleansed/Irrigation   Dressing Silicone foam bordered dressing     Continence Status:   Incontinence of bladder: Foley in place  Incontinent of bowel: constipated    Moisture Associated Skin Damage:   - none     Lab Results   Component Value Date    WBC 7.1 01/30/2018    HGB 7.0 (L) 01/30/2018    HCT 20.4 (L) 01/30/2018    ESR 64 (H) 01/25/2018    CRP 83.2 (H) 01/25/2018    A1C 6.4 (H) 01/01/2018    GLU 177 01/30/2018    POCGLU 212 (H) 01/30/2018    ALBUMIN 1.5 (L) 01/30/2018    PROT 3.9 (L) 01/30/2018 Risk Factors:   - Aging  - Cognitive Impairment  - Friction/shear  - Immobility  - Multiple co-morbidities    Braden Scale Score: 15         Support Surface:   - Low Air Loss - ICU    Type Debridement Completed By WOCN:  N/A    Teaching:  - Improved nutrition   - Moisture management  - Offloading  - Turning and repositioning  - Wound care    WOCN Recommendations: L and R turning only, wedges, optimize nutrition, antimicrobial dressings for prevention, pending goals of care talk, consider enzymatic debridement- if drainage worsens or there are concerns for infection to bone, please consider scan (again, pending goals of care discussion)    - See nursing orders for wound care instructions.  - Contact WOCN with questions, concerns, or wound deterioration.    Topical Therapy/Interventions:   - Antimicrobial Solutions  - Santyl w saline moistened gauze dressing    Recommended Consults:  - Not Applicable    WOCN Follow Up:  - Weekly    Plan of Care Discussed With:   - Patient  - Family  - RN primary  - LIP resident    Supplies Ordered: No    Workup Time:   60 minutes     Hamilton Capri, RN-BSN, [facility]  [facility] Wound Arboriculturist   [phone]

## 2018-01-30 NOTE — Unmapped (Signed)
Med A Daily Progress Note    Subjective/Interval History:  Ms. Berkery reports that she is feeling well this morning. She states that her breathing has improved and she is no longer symptomatic. Her daughter is at the bedside and agrees that patient's breathing is definitely better. Ms. Herro has no complaints and denies pain, chills, lightheadedness. She is hemodynamically stable without clinical signs of infection.    Assessment/Plan:    Principal Problem:    Hypotension  Active Problems:    SOB (shortness of breath)    Diabetes mellitus, type 2 (CMS-HCC)    Metastatic lung cancer (metastasis from lung to other site), unspecified laterality (CMS-HCC)    Anemia    Pressure injury of sacral region, stage 2 (CMS-HCC)    Tachycardia  Resolved Problems:    * No resolved hospital problems. *        @LDAPRESSUREULCER @     Hospital Day 1    Tyffany V Raggio is a 84 y.o. female who presented to [facility] with Hypotension.    Ms. Sieh is an 84 yo woman with a PMHx of metastatic NSCLC (Stage IV, on crizotinib) c/b b/l malignant effusions s/p bilateral pleurX catheters, hx multiple LE ulcers, AFib on elequis, and anemia who re-presented after discharge on 01/29/18 with acute shortness of breath, hypotension, and tachycardia.    Acute Dyspnea, Possible Recurrent Sepsis  Patient with sudden onset shortness of breath after she transferred to the car to be discharged from the hospital on 01/29/18. She was originally admitted on 01/21/18 and treated for septic shock (Klebsiella and Bacteroides bacteremia, also with Pseudomonas UTI). On re-admission exam, patient was tachycardic and hypotensive. She was given IV fluids with improvement in hemodynamic status. CXR unchanged and CTA negative for PE. WBC was initially elevated to 13.4 but subsequently normalized overnight. Lactate initially 2.2 on admission but within normal limits on 12/3 AM. She also has a deep sacral ulcer, but no evidence of active infection. Patient was afebrile and without chills. Troponins negative. EKG showed Afib with RVR. On 12/3 AM, patient is HDS and is feeling better with notable improvement in breathing symptoms. Precise etiology for sudden onset dyspnea is unclear, but possibilities include recurrent sepsis, worsening heart failure (she does have a history of diastolic dysfunction), and flash pulmonary edema.  -- Continue vanc/cefepime/flagyl for 24 hours until blood and urine cultures return.   -- Follow up blood and urine cultures. If negative, likely discontinue antibiotics.  -- Plan on continuing to have goals of care discussions with patient and family. Patient is currently full code. On admission, patient's daughter stated they would still want aggressive care including escalation to ICU status, central line, arterial line, etc.    Anasarca  Patient is volume overloaded on exam with generalized edema and JVD. Likely a response to IVF resuscitation. Will hold diuresis for now as patient would likely continue to third space with low oncotic pressure (albumin 1.5).    -- Defer diuresis for now and reassess.    Deep Sacral Decubitus Ulcer  No evidence of active infection, but would remains at high risk of infection. Appreciate WOCN recommendations. Necrotic tissue was debrided from wound base.  -- Collagenase moist dressings.  -Wound care consult     Stage IV NSCLC, Bilateral Pleural Effusions  Patient with history of metastatic lung cancer with known metastases to liver and bilateral malignant pleural effusions. PleurX catheters are in place and drained every other day. She does follow with Dr. Legrand Como for Palliative Care.  --  PleurX drainage every other day.  -- Hold crizotinib in setting of possible infection.  -- Continue goals of care discussions.    Urinary Retention  During prior admission, patient failed trial of void and was discharged with foley catheter in place. Plan on trial of void prior to discharge.     Anemia  Likely a combination of iron deficiency and anemia of chronic inflammation based on iron studies. During prior admission hemoglobin was stable around 8s. Hemoglobin 8.6 in the ED and down to 7 on 01/30/18 AM; this drop is likely dilutional secondary to IVFs. 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  -- Planning on IV Ferrlicet after confirming negative cultures.  -- Consider whether to discontinue apixaban as below pending goals of care discussions.    Atrial Fibrillation  HR has generally been under reasonable control in the 90-100 range, although was elevated in ED. Patient is on apixaban for anti-coagulation. Given possibly of bleeding from duodenal mass, benefits of anticoagulation may not outweigh risks of bleeding.  -- Pending goals of care discussion, decide whether to discontinue apixaban.     T2DM  Hemoglobin A1c 6.4 on 01/01/18. Prior to previous admission, patient had been on home insulin Novolog 70/30 8 units BID with some borderline hypoglycemia at home.   -- SSI.  -- Will monitor blood glucose levels during admission and consider transitioning from SSI to insulin glargine.     Delirium  Patient's mental status waxes and wanes. She seems to improve with family at the bedside.  -- Delirium precautions.    Diet: Regular diet    Prophylaxis  DVT: Currently on therapeutic anticoagulation with apixaban  ___________________________________________________________________    Labs/Studies:  Labs and Studies from the last 24hrs per EMR and Reviewed    Objective:  BP 94/52  - Pulse 96  - Temp 35.8 ??C (96.4 ??F) (Axillary)  - Resp 13  - SpO2 98%     Physical Exam:  General: Chronically ill appearing. Resting comfortably, in no acute distress.  HEENT: Moist mucous membranes.  CV: Heart RRR with no murmurs appreciated. JVP elevated to the mid neck, fluctuation with respiration  Lungs: CTAB with no crackles appreciated (although exam limited due to patient's difficulty sitting upright). Normal work of breathing on 2L [place].  Abdomen: Soft, non-tender. Edema appreciated. Positive bowel sounds. No masses.  Extremities: 2+ edema in lower extremities bilaterally. Additionally there is edema in upper extremities.  Neuro: Alert and oriented to person.  Skin: Sacral decubitus ulcer with evidence of necrotic tissue; stable from prior exams.      Marva Panda, MS3      I attest that I have reviewed the medical student note and that the components of the history of the present illness, the physical exam, and the assessment and plan documented were performed by me or were performed in my presence by the student where I verified the documentation and performed (or re-performed) the exam and medical decision making. Berton Bon, MD

## 2018-01-30 NOTE — Unmapped (Signed)
Vancomycin Therapeutic Monitoring Pharmacy Note    [name] [name] is a 84 y.o. female starting vancomycin. Date of therapy initiation: 01/29/18    Indication: Bacteremia/Sepsis    Prior Dosing Information: Vanc 1500 mg loading dose     Goals:  Therapeutic Drug Levels  Vancomycin trough goal: 15-20 mg/L    Additional Clinical Monitoring/Outcomes  Renal function, volume status (intake and output)    Results: Not applicable    Wt Readings from Last 1 Encounters:   01/21/18 63.9 kg (140 lb 14 oz)     Creatinine   Date Value Ref Range Status   01/29/2018 0.74 0.60 - 1.00 mg/dL Final   16/11/9602 5.40 0.60 - 1.00 mg/dL Final   98/12/9145 8.29 0.60 - 1.00 mg/dL Final        Pharmacokinetic Considerations and Significant Drug Interactions:  ? Adult (estimated initial): Vd = 45.4 L, ke = 0.0432 hr-1  ? Concurrent nephrotoxic meds: not applicable    Assessment/Plan:  Recommendation(s)  ? Start vancomycin 1250 mg Q24H with next dose 12/3 1600.  ? Estimated trough on recommended regimen: 16 mg/L    Follow-up  ? Level due: prior to fourth or fifth dose.   ? A pharmacist will continue to monitor and order levels as appropriate    Please page service pharmacist with questions/clarifications.    Dot Lanes, PharmD

## 2018-01-31 DIAGNOSIS — A419 Sepsis, unspecified organism: Principal | ICD-10-CM

## 2018-01-31 LAB — CBC W/ AUTO DIFF
HEMATOCRIT: 20.5 % — ABNORMAL LOW (ref 36.0–46.0)
HEMOGLOBIN: 7.2 g/dL — ABNORMAL LOW (ref 13.5–16.0)
MEAN CORPUSCULAR HEMOGLOBIN CONC: 35.2 g/dL (ref 31.0–37.0)
MEAN CORPUSCULAR HEMOGLOBIN: 26.6 pg (ref 26.0–34.0)
MEAN CORPUSCULAR VOLUME: 75.6 fL — ABNORMAL LOW (ref 80.0–100.0)
PLATELET COUNT: 173 10*9/L (ref 150–440)
RED CELL DISTRIBUTION WIDTH: 25.3 % — ABNORMAL HIGH (ref 12.0–15.0)
WBC ADJUSTED: 6 10*9/L (ref 4.5–11.0)

## 2018-01-31 LAB — COMPREHENSIVE METABOLIC PANEL
ALBUMIN: 1.6 g/dL — ABNORMAL LOW (ref 3.5–5.0)
ALKALINE PHOSPHATASE: 190 U/L — ABNORMAL HIGH (ref 38–126)
ALT (SGPT): 24 U/L (ref <35)
ANION GAP: 3 mmol/L — ABNORMAL LOW (ref 7–15)
AST (SGOT): 34 U/L (ref 14–38)
BILIRUBIN TOTAL: 0.3 mg/dL (ref 0.0–1.2)
BLOOD UREA NITROGEN: 26 mg/dL — ABNORMAL HIGH (ref 7–21)
BUN / CREAT RATIO: 34
CHLORIDE: 111 mmol/L — ABNORMAL HIGH (ref 98–107)
CO2: 21 mmol/L — ABNORMAL LOW (ref 22.0–30.0)
CREATININE: 0.77 mg/dL (ref 0.60–1.00)
EGFR CKD-EPI AA FEMALE: 81 mL/min/{1.73_m2} (ref >=60)
EGFR CKD-EPI NON-AA FEMALE: 71 mL/min/{1.73_m2} (ref >=60)
GLUCOSE RANDOM: 209 mg/dL — ABNORMAL HIGH (ref 65–179)
PROTEIN TOTAL: 4.1 g/dL — ABNORMAL LOW (ref 6.5–8.3)
SODIUM: 135 mmol/L (ref 135–145)

## 2018-01-31 LAB — RED CELL DISTRIBUTION WIDTH: Lab: 25.3 — ABNORMAL HIGH

## 2018-01-31 LAB — ALKALINE PHOSPHATASE: Alkaline phosphatase:CCnc:Pt:Ser/Plas:Qn:: 190 — ABNORMAL HIGH

## 2018-01-31 LAB — SMEAR REVIEW

## 2018-01-31 NOTE — Unmapped (Signed)
VSS, 2L [place], no SOB reported or noted, turned q2hr, pt alert to slef, and situation mostly, new it was day time and month. Knows shes in the hospital.   Problem: Adult Inpatient Plan of Care  Goal: Plan of Care Review  Outcome: Progressing  Goal: Patient-Specific Goal (Individualization)  Outcome: Progressing  Goal: Absence of Hospital-Acquired Illness or Injury  Outcome: Progressing  Goal: Optimal Comfort and Wellbeing  Outcome: Progressing  Goal: Readiness for Transition of Care  Outcome: Progressing  Goal: Rounds/Family Conference  Outcome: Progressing     Problem: Diabetes Comorbidity  Goal: Blood Glucose Level Within Desired Range  Outcome: Progressing     Problem: Skin Injury Risk Increased  Goal: Skin Health and Integrity  Outcome: Progressing     Problem: Self-Care Deficit  Goal: Improved Ability to Complete Activities of Daily Living  Outcome: Progressing     Problem: Fall Injury Risk  Goal: Absence of Fall and Fall-Related Injury  Outcome: Progressing   mild pain controlled with PRN and repositioning. Wounds assessed by WOCN RN. Daughter at bedside, pleurx drains to be drained tomorrow. Supplies ordered. No acute events, see charting

## 2018-01-31 NOTE — Unmapped (Signed)
Med A Daily Progress Note    Subjective/Interval History:  [name] [name] reports that she feels good today with no shortness of breath. She denies pain, dizziness, and lightheadedness. Her daughter is at the bedside and agrees that [name] [name] seems to be getting along well and without any breathing difficulties.    Blood cultures returned with no growth at 24 hours. Will de-escalate to oral regimen with levofloxacin and metronidazole. Will also start IV ferric gluconate. Given persistent generalized anasarca will diurese with IV furosemide 20mg  and monitor response.    She has significantly diminished pulses in left upper extremity. Will obtain upper extremity dopplers.    Assessment/Plan:    Principal Problem:    Hypotension  Active Problems:    SOB (shortness of breath)    Diabetes mellitus, type 2 (CMS-HCC)    Metastatic lung cancer (metastasis from lung to other site), unspecified laterality (CMS-HCC)    Anemia    Pressure injury of sacral region, stage 2 (CMS-HCC)    Tachycardia  Resolved Problems:    * No resolved hospital problems. *        @LDAPRESSUREULCER @     Hospital Day 2    [name] [name] is a 84 y.o. female who presented to [facility] with Hypotension.    [name] [name] is an 84 yo woman with a PMHx of metastatic NSCLC (Stage IV, on crizotinib) c/b b/l malignant effusions s/p bilateral pleurX catheters, hx multiple LE ulcers, AFib on elequis, and anemia who re-presented after discharge on 01/29/18 with acute onset shortness of breath, hypotension, and tachycardia.    Acute Dyspnea, Possible Recurrent Sepsis  Patient with sudden onset shortness of breath after she transferred to the car to be discharged from the hospital on 01/29/18. She was originally admitted on 01/21/18 and treated for septic shock (Klebsiella and Bacteroides bacteremia, also with Pseudomonas UTI). On re-admission exam, patient was tachycardic and hypotensive. She was given IV fluids with improvement in hemodynamic status. CXR unchanged and CTA negative for PE. Lactate and WBC initially elevated but subsequently normalized. She also has a deep sacral ulcer, but no evidence of active infection. Patient afebrile and without chills. Troponins negative. EKG showed Afib with RVR. Precise etiology for sudden onset dyspnea is unclear, but possibilities include recurrent sepsis, worsening heart failure (history of diastolic dysfunction), flash pulmonary edema, and anxiety (her soon believes she was quite anxious as she was getting into the car to leave). On 12/4 AM, patient continues to be HDS with no shortness of breath. Blood cultures with no growth at 24 hours x 2 so will de-escalate to the oral regimen she was recently discharged on, levofloxacin and metronidazole, which will provide sufficient coverage.  -- Transition to levofloxacin PO 500mg  daily (last dose 02/02/18) and metronidazole PO 500mg  TID (last dose 02/04/18).  -- Continue following blood and urine cultures.  -- Continue goals of care discussions with patient and family.    Anasarca  Patient is volume overloaded on exam with worsening generalized edema and JVD. Suspect related to IVF resuscitation on admission. Blood pressures soft, but stable. Will proceed with diuresis and monitor response.  -- Furosemide 20mg  IV one dose.    Diminished Pulses in Left Upper Extremity  Patient with significantly diminished pulses in left upper extremity. Blood pressure must be taken on right arm to get an accurate reading.  -- Obtain upper extremity dopplers for further evaluation.    Deep Sacral Decubitus Ulcer  No evidence of active infection, but wound remains at  high risk of infection. Necrotic tissue was debrided from wound base on 01/30/18.  -- Continue collagenase moist dressings.  -- Wound care consulted, appreciate recs.    Stage IV NSCLC, Bilateral Pleural Effusions  Patient with history of metastatic lung cancer with known metastases to liver and bilateral malignant pleural effusions. PleurX catheters are in place and drained every other day. She does follow with Dr. Legrand Como for Palliative Care.  -- PleurX drainage every other day. Next drain is today (01/31/18).  -- Hold crizotinib in setting of possible infection.  -- Continue goals of care discussions.    Urinary Retention  During prior admission, patient failed trial of void and was discharged with foley catheter in place.  -- Plan on trial of void prior to discharge.     Anemia  Likely a combination of iron deficiency and anemia of chronic inflammation based on iron studies. During prior admission hemoglobin was stable around 8s. Hemoglobin 8.6 in the ED and down to 7 on 12/3; Hgb 7.2 on 12/4. Suspect patient is bleeding from the duodenal mass. Calculated iron deficit is approximately 930mg .  -- IV ferric gluconate 125mg  BID.      Atrial Fibrillation  HR has generally been under reasonable control in the 90-100 range, although was elevated in ED. Patient is on apixaban for anti-coagulation. Given possibly of bleeding from duodenal mass, benefits of anticoagulation may not outweigh risks of bleeding.     -- Pending goals of care discussion, decide whether to discontinue apixaban.     T2DM  Hemoglobin A1c 6.4 on 01/01/18. Prior to previous admission, patient had been on home insulin Novolog 70/30 8 units BID with some borderline hypoglycemia at home. Goal is 150-200.  -- SSI    Delirium  Patient's mental status waxes and wanes. She seems to improve with family at the bedside.  -- Continue delirium precautions.    Diet: Regular diet    Prophylaxis  DVT: Currently on therapeutic anticoagulation with apixaban.  ___________________________________________________________________    Labs/Studies:  Labs and Studies from the last 24hrs per EMR and Reviewed    Objective:  BP 92/54  - Pulse 85  - Temp 36.3 ??C (97.3 ??F) (Oral)  - Resp 16  - SpO2 97%     Physical Exam:  General: Chronically ill appearing. Resting comfortably, in no acute distress. HEENT: Moist mucous membranes.  CV: Heart RRR with no murmurs appreciated. JVP elevated to the mid neck, fluctuation with respiration.  Lungs: CTAB with no crackles appreciated (although exam limited due to patient's difficulty sitting upright). Normal work of breathing on 2L [place].  Abdomen: Soft, non-tender. Edema appreciated. Positive bowel sounds. No masses.  Extremities: 3+ edema in lower extremities bilaterally. Additionally there is edema in upper extremities. Unable to palpate brachial pulse in left upper extremity. Good right brachial pulse.  Neuro: Alert and oriented to person.  Skin: Sacral decubitus ulcer with evidence of necrotic tissue; stable from prior exams. There is some weeping in the right arm.      Marva Panda, MS3    I attest that I have reviewed the medical student note and that the components of the history of the present illness, the physical exam, and the assessment and plan documented were performed by me or were performed in my presence by the student where I verified the documentation and performed (or re-performed) the exam and medical decision making. Berton Bon, MD

## 2018-01-31 NOTE — Unmapped (Signed)
PT stable, no acute events overnight.  Neuro: AOx2-3. CAM-ICU +. PERRL. No prn's given overnight.   CV: SD status. HR 70's. SBP 80-90's/DBP 50's. Afebrile. Pulses weak but palpable.    Resp: 2L [place], no desaturations noted.   GI/GU: UOP adequate. Last BM unknown. Regular diet with poor PO intake. ACHS, BG was >200 at the beginning of shift, pt was given 4 units of lispro.   Lines: x2 20g's PIV R bicep, both flushed/patent.   Wounds: Stage III-IV on sacrum. LLE wounds on knee and ankle. Pt bathed overnight and all wound dressings changed.   Pt is Q2H turns. Family bedside. Pt safe/free from falls.     Problem: Adult Inpatient Plan of Care  Goal: Plan of Care Review  Outcome: Progressing  Goal: Patient-Specific Goal (Individualization)  Outcome: Progressing  Goal: Absence of Hospital-Acquired Illness or Injury  Outcome: Progressing  Goal: Optimal Comfort and Wellbeing  Outcome: Progressing  Goal: Readiness for Transition of Care  Outcome: Progressing  Goal: Rounds/Family Conference  Outcome: Progressing     Problem: Diabetes Comorbidity  Goal: Blood Glucose Level Within Desired Range  Outcome: Progressing     Problem: Skin Injury Risk Increased  Goal: Skin Health and Integrity  Outcome: Progressing     Problem: Self-Care Deficit  Goal: Improved Ability to Complete Activities of Daily Living  Outcome: Progressing     Problem: Fall Injury Risk  Goal: Absence of Fall and Fall-Related Injury  Outcome: Progressing

## 2018-01-31 NOTE — Unmapped (Signed)
84 year old woman mid abdomen initially 11/24/??2019. ??Family is at bedside, patient is a poor informant.  ??  Initial history on admission  ??  1. ??Sepsis picture. ??She was initially admitted with a picture of sepsis. ??At that time,??one blood culture grew Klebsiella and the second which are grew Bacteroides. ??It seems unusual that Klebsiella would not grew out of both flasks to indicate a  true positive culture. On the other hand, Bacteroides can sometimes be difficult to culture. ??Patient also had a urinary tract infection which was secondary to pansensitive Pseudomonas, and also has a pressure ulcer on her back. ??She appears to responded to the combination of levofloxacin and metronidazole.  ??  2. ??Metastatic cancer. She??has known metastatic lung cancer with multiple lung nodules.????There is, as well mass near the duodenum which appears to be localized near the duodenum. ??In addition there is a new mass in the left hepatic lobe that also appears to be metastatic. ??There is also a new enhancing lesion in the inferior pole the right kidney, is also concerning for a metastasis versus a renal malignancy. ??She was also shown to have a large amount of ascites, with thickened and edematous bowel.   ??  3. ??Diastolic heart failure. ??Patient has moderate pulmonary hypertension, moderate tricuspid regurgitation, and preserved ejection fraction.  ??  4. ??Anemia.????Patient has an anemia with a hematocrit of 20, the ferritin unfortunately is unfortunately not helpful,??since it is a phase reactant. ??The iron strongly suggest iron deficiency and this also implies the high probability of bleeding from the mass in the duodenum which may be infiltrating the duodenal wall.  ??  Past medical history.  Patient never smoked or drank.  Operations include a total melanoma, hysterectomy, cholecystectomy, and a branchial cleft cystectomy.  Family history is positive for diabetes and hypertension.  Allergies are to sulfa.  Medications have been reviewed and confirmed in the workstation.  No additions include cefepime, metronidazole and vancomycin.  ??  Review of systems.  Patient does not feel short of breath this morning, does feel drowsy and sleepy but is not complaining of pain anywhere.  Her daughter who is with her says that she has not had any chills or not aware fever, there is been no symptoms of seizures and she yesterday was not panting even when she was stating she was short of breath.  There are no new focal neurological signs according to the family, and she appears to feel better this morning.  ??  Initial laboratory values.  CTA scan shows no new nodules, or airspace disease nor other pulmonary emboli.  A VBG shows a pH of 7.46, PCO2 36, and oxygen level of 41.  The conference of metabolic panel is normal except for a chloride of 114, BUN of 25, albumin of 1.5, total protein 3.9, AST of 39 and alk phos of 165.  WBC is 7.1 although it had risen to 13.4 initially.  Hematocrit once again 20.4, count 178,000.  The urinalysis shows moderate amounts of protein, large amount of blood in the urine with RBCs scattered WBCs rare bacteria and number of granular casts.  The INR is 1.85, but the patient is on apixaban.      Hospital course  ??  1.  Acute dyspnea.  The cause of this acute dyspneic episode is not clear, family believes that she is back to baseline.  She has not complained of dyspnea nor we have we seen any evidence of a possible source.  She is however fluid overloaded  and we have thought it might be useful to diurese her keeping into account her soft blood pressures.  ??  2.  Question of recurrent sepsis.  Blood cultures to date are active, there is little to suggest a current active infection though the urinalysis showed large amounts of blood, there were very few WBCs and rare bacteria.  ??  3.  Urinary retention.  Patient has a chronic indwelling Foley because of her inability to void, it is felt that this is the source of her recent urinary tract infection with Pseudomonas.  Pseudomonas is very difficult to treat in a patient as long as I have an indwelling catheter, normally it is wise to remove the Foley and replace it.  Treatment and is only based on clinical symptomatology.  ??  4.  Metastatic cancer.  As noted in initial history, has known metastatic lung cancer that appears metastatic node only to the lung but also to the liver and to the skull and perhaps the kidney.  She is currently on a tyrosine kinase inhibitor.  She has a chronic indwelling right pleural catheter to drain a malignant pleural effusion.  Is also noted were that the patient has a past history of melanoma dating back to 2006.  ??  5.  Nutritional deficiency with hypoalbuminemia.  Appears to be secondary not only to her cancer, but also to the loss of protein from the pleural space due to drainage of the pleural catheter.  In addition she has diffuse edema.  ??  6.  Diastolic heart failure.  The patient is known to have diastolic heart failure with pulmonary hypertension and evidence of right-sided overload.  ??  7.  Minor cognitive disorder.  She also has minor cognitive disorder, which the family notes has suddenly worsened since yesterday.  ??  8.  Anemia.   Hematocrit remains around 20.5, reticulocyte count has however been the upper limits of normal at around 100.  We are going to begin iron infusions now that her blood cultures remain negative.  ??  9.  Fibrillation.  She remains on apixaban as an anticoagulant.  Her heart rate has maintained less than 100 with average values being around 85.  ??  10.  Osteoporosis.  Patient has osteoporosis with a T12 compression fracture.  ??  11.  Diabetes.  Since her admission the patient sugars have ranged from 185-287, her most recent hemoglobin A1c this month was 6.4%.  She is currently on a sliding scale of rapid acting insulin.  In July the albumin creatinine ratio was less than 0.6.  ??  12.  Sacral pressure ulcer.  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  This is not been observed to lose but does have significant dead tissue overlying it requiring debridement.    ??  Physical exam.  General.    She is more alert today, and appears to be back to baseline.  Vital signs. 36.6, 85 and irregular, 79, 13, 100% on 2 L, 107/55.  Skin. ??There is a 6 x 4 cm stage IV pressure ulcer of the sacrum, with dead tissue overlying, but no drainage.  This has not changed.  HEENT.  Current conjunctiva are normal, I do not feel the thyroid, neck veins are distended and increase on inspiration.  Respiratory. ??There are few scattered rales at the bases, the patient really has a great deal of difficulty eating sitting up with assistance, and this exam is optimal. .  Cardiovascular. ??  The rhythm is mostly regular this morning, I hear no murmurs or gallops.  Abdomen.  The liver and spleen are likely of normal size, there is substantial abdominal wall edema, bowel sounds are quiet, but there are no tender areas or masses felt.  GU.  Foley catheter in place  Extremities.  Pulses are very weak, 2+ edema in the legs and there is generalized anasarca.  Musculoskeletal.  There is modest osteoarthritis of the knees and less of the hands, no joints are warm or swollen.  Psychiatric.  Patient is somnolent, and not oriented, but speaking in sentences that are relevant.  Neurological.  Cranial nerves appear intact, upper extremity strength is normal and tone is normal also.  She is able to move the lower extremities but cannot cooperate with the sensory exam.  Gait and station were not tested.  Toes are downgoing.  ??  Impression.  1.  Sudden onset of dyspnea, cause uncertain, possibly worsening heart failure or sepsis.  2.  Past septic picture and possibly recurrent sepsis.  Multiple possible sites including bacterial peritonitis (clinical picture however does not fit), gut translocation of bacteria, duodenal mass, and more likely urinary tract infection.  3. ??Metastatic non-small cell lung cancer with new deposits in the liver  4. 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 ??Mass in the in the lower pole of the right kidney, question metastasis or primary renal carcinoma.  6. ??Iron deficiency anemia due to bleeding presumably from the duodenal mass.  5. ??History Pseudomonas UTI.  6. ??Suspect past contamination of blood cultures  7. ??Stage IV pressure ulcer of the sacrum.  8. ??Osteoporosis with T12 compression fracture.  9.  Diabetes mellitus  10.  Cognitive disorder with superimposed mild delirium  11.  Generalized anasarca and ascites due to albuminemia and possible sepsis picture  ??  Suggestions.   At this point I am uncertain as to the cause of her decline, her son thinks she became anxious because of all the people helping her in the car.  Nevertheless currently we will diurese her a bit due to her generalized anasarca.  I think she should be ready to return home.  Much of the future discussion should focus on goals of care and I will try to contact her oncologist today.

## 2018-01-31 NOTE — Unmapped (Signed)
Adult Nutrition Assessment Note    Visit Type: RN Consult  Reason for Visit: Have you gained or lost 10 pounds in the past 3 months?, Have you had a decrease in food intake or appetite?    ASSESSMENT:   HPI & PMH: Per EMR, [name] [name] is a 84 y.o. female with PMHx stage IV NSCLC, T2DM, afib that presented to [facility] with acute onset shortness of breath when climbing into the car to discharge from the hospital today. In the ED she was found to be tachycardic to 190s initially. Now hypotensive, tachycardic concerning for recurrent sepsis. Sick on admission.  Nutrition Hx: NutHx provided by family at bedside. Family mentions UBW of 137 lbs, mentions she may have lost a little weight recently. Unclear if recent weights in EMR are accurate, fluctuating up/down. Pt appetite PTA at home is ravenous per family, having 100% of 3-4 meals a day if not more.  Family denies any pt N/V/C/D, but does endorse some loose stools. Family denies chewing/swallowing difficulties, food allergies. Since patient has been at the hospital, she has been having 1 meal/day per family. Per EMR, pt has had 0/50/75% of the last 3 meals.  Nutritionally Pertinent Meds: ferric gluconate, insulin, PRN: D50W  Labs: Hgb 7.2, K 3.2, Cl 111, BUN 26, Glucose POC 209-216  Abd/GI: Last BM 11/29  Skin: unstageable pressure injury sacrum, multiple wounds.  Patient Lines/Drains/Airways Status    Active Wounds     Name:   Placement date:   Placement time:   Site:   Days:    Wound 01/20/18 Pressure Injury Sacrum Mid Unstageable   01/20/18    0000    Sacrum   11    Wound 12/14/17 Skin Tear Knee Left   12/14/17    ???    Knee   48    Wound 01/23/18 Other (comment) Ankle Left;Outer   01/23/18    1600    Ankle   7               Current nutrition therapy order:   Nutrition Orders          Supplement Adult; Ensure Clear (Clear Liquid); # of Products PER Serving: 1 3xd Meals starting at 12/04 1700    Nutrition Therapy General (Regular) starting at 12/02 2147 Anthropometric Data:  -- Height: 162.6 cm (5' 4.02)   -- Last recorded weight: 63.9 kg (140 lb 14 oz)  -- Admission weight: 63.9 kg (140 lb 14 oz)  -- IBW: 54.53 kg  -- Percent IBW: 117.18 %  -- BMI: Body mass index is 24.17 kg/m??.   -- Weight changes this admission:   Last 5 Recorded Weights    01/31/18 1227 01/31/18 1500   Weight: 63.9 kg (140 lb 14 oz) 63.9 kg (140 lb 14 oz)      -- Weight history PTA:   Wt Readings from Last 10 Encounters:   01/31/18 63.9 kg (140 lb 14 oz)   01/21/18 63.9 kg (140 lb 14 oz)   01/16/18 58.8 kg (129 lb 9.6 oz)   01/08/18 62.1 kg (137 lb)   01/05/18 61.2 kg (134 lb 14.7 oz)   12/20/17 61.2 kg (135 lb)   12/11/17 59.9 kg (132 lb)   11/30/17 60.8 kg (134 lb)   11/28/17 60.8 kg (134 lb 1.3 oz)   11/21/17 61.9 kg (136 lb 6.4 oz)        Daily Estimated Nutrient Needs:   Energy: 1600-1920 kcals [25-30 kcal/kg using  last recorded weight, 64 kg (01/31/18 1545)]  Protein: 77-96 gm [1.2-1.5 gm/kg using last recorded weight, 64 kg (01/31/18 1545)]  Carbohydrate:   [no restriction]  Fluid:   [per MD team]     Nutrition Focused Physical Exam:  Fat Areas Examined  Orbital: Moderate loss      Muscle Areas Examined  Temple: Severe loss  Clavicle: Severe loss  Acromion: Severe loss         Nutrition Evaluation  Overall Impressions: Muscle loss noted, however unable to definitively designate malnutrition as the etiology;Fat loss noted, however unable to definitively designate malnutrition as the etiology(Brief NFPE, pt uncomfortable) (01/31/18 1546)  Nutrition Designation: Normal weight (BMI 18.50 - 24.99 kg/m2) (01/31/18 1546)     DIAGNOSIS:  Malnutrition Assessment using AND/ASPEN Clinical Characteristics:        Malnutrition dx pending, need full NFPE          Overall nutrition impression:   Pt with 100% intake of 3-4 meals/day and more per family. Losses on NFPE likely due to age, muscle loss 2/2 lung cancer, stage 4. Needs full NFPE. Per family, when pt is at home, eats ravenously, but when at the hospital eats less.  Pt likes Ensure Clear, ordered these TID. Monitor intake, weight, lytes, BG, BMs. Pt with unstageable pressure injury to sacrum.     GOALS:  Oral Intake:       - Patient to consume >/=50% of 3 meals per day.  - Patient to consume >/=50% of 1 oral supplements per day.  Anthropometric:       - Maintain weight within 2% of 63.9 kg over course of hospitalization.  Laboratory Data:       - Electrolyte and renal profile will trend towards normal limits.  - Blood Glucose and/or A1C values trend towards normal limits.     RECOMMENDATIONS AND INTERVENTIONS:  1. Recommend continue current diet order  2. RD will order Ensure Clear TID to encourage intake  3. Recommend bowel regimen if EMR accurate, last BM 11/29  4. Consider 500mg  Vitamin C and 220mg  Zinc daily for wound healing - unstageable pressure ulcer on sacrum (d/c zinc after 10 days, interferes with copper absorption)  5. Optimize blood glucose levels 180mg /dL  6. Replete lytes PRN, K 3.2  7. Continue to monitor intake per % meal documentation in EMR  8. Please weigh patient weekly      RD Follow Up Parameters:  1-2 times per week (and more frequent as indicated)     I appreciate the opportunity to participate in the care of this patient.  Please contact me with any questions.     Abel Presto, MPH, RDN, LDN, CSCS  Pager: [phone]  Phone: [phone]

## 2018-02-01 DIAGNOSIS — A419 Sepsis, unspecified organism: Principal | ICD-10-CM

## 2018-02-01 LAB — COMPREHENSIVE METABOLIC PANEL
ALBUMIN: 1.6 g/dL — ABNORMAL LOW (ref 3.5–5.0)
ALKALINE PHOSPHATASE: 224 U/L — ABNORMAL HIGH (ref 38–126)
ALT (SGPT): 25 U/L (ref <35)
ANION GAP: 5 mmol/L — ABNORMAL LOW (ref 7–15)
AST (SGOT): 30 U/L (ref 14–38)
BILIRUBIN TOTAL: 0.3 mg/dL (ref 0.0–1.2)
BLOOD UREA NITROGEN: 24 mg/dL — ABNORMAL HIGH (ref 7–21)
BUN / CREAT RATIO: 29
CALCIUM: 8.5 mg/dL (ref 8.5–10.2)
CHLORIDE: 108 mmol/L — ABNORMAL HIGH (ref 98–107)
CREATININE: 0.84 mg/dL (ref 0.60–1.00)
EGFR CKD-EPI AA FEMALE: 73 mL/min/{1.73_m2} (ref >=60)
EGFR CKD-EPI NON-AA FEMALE: 64 mL/min/{1.73_m2} (ref >=60)
GLUCOSE RANDOM: 223 mg/dL — ABNORMAL HIGH (ref 65–179)
POTASSIUM: 2.8 mmol/L — ABNORMAL LOW (ref 3.5–5.0)
PROTEIN TOTAL: 4 g/dL — ABNORMAL LOW (ref 6.5–8.3)
SODIUM: 135 mmol/L (ref 135–145)

## 2018-02-01 LAB — CBC W/ AUTO DIFF
BASOPHILS ABSOLUTE COUNT: 0 10*9/L (ref 0.0–0.1)
BASOPHILS RELATIVE PERCENT: 0.3 %
EOSINOPHILS ABSOLUTE COUNT: 0 10*9/L (ref 0.0–0.4)
EOSINOPHILS RELATIVE PERCENT: 0.3 %
HEMATOCRIT: 20.4 % — ABNORMAL LOW (ref 36.0–46.0)
HEMOGLOBIN: 7.2 g/dL — ABNORMAL LOW (ref 13.5–16.0)
LARGE UNSTAINED CELLS: 2 % (ref 0–4)
LYMPHOCYTES ABSOLUTE COUNT: 0.6 10*9/L — ABNORMAL LOW (ref 1.5–5.0)
LYMPHOCYTES RELATIVE PERCENT: 9 %
MEAN CORPUSCULAR HEMOGLOBIN CONC: 35.2 g/dL (ref 31.0–37.0)
MEAN CORPUSCULAR HEMOGLOBIN: 26.8 pg (ref 26.0–34.0)
MEAN CORPUSCULAR VOLUME: 76 fL — ABNORMAL LOW (ref 80.0–100.0)
MEAN PLATELET VOLUME: 7.6 fL (ref 7.0–10.0)
MONOCYTES ABSOLUTE COUNT: 0.4 10*9/L (ref 0.2–0.8)
MONOCYTES RELATIVE PERCENT: 6.6 %
NEUTROPHILS ABSOLUTE COUNT: 5.3 10*9/L (ref 2.0–7.5)
NEUTROPHILS RELATIVE PERCENT: 82 %
NUCLEATED RED BLOOD CELLS: 0 /100{WBCs} (ref <=4)
RED CELL DISTRIBUTION WIDTH: 25.5 % — ABNORMAL HIGH (ref 12.0–15.0)
WBC ADJUSTED: 6.4 10*9/L (ref 4.5–11.0)

## 2018-02-01 LAB — BASOPHILIC STIPPLING

## 2018-02-01 LAB — MONOCYTES RELATIVE PERCENT: Lab: 6.6

## 2018-02-01 LAB — SLIDE REVIEW

## 2018-02-01 LAB — BLOOD UREA NITROGEN: Urea nitrogen:MCnc:Pt:Ser/Plas:Qn:: 24 — ABNORMAL HIGH

## 2018-02-01 MED ORDER — METRONIDAZOLE 500 MG TABLET
ORAL_TABLET | Freq: Three times a day (TID) | ORAL | 0 refills | 0 days
Start: 2018-02-01 — End: 2018-02-04

## 2018-02-01 MED ORDER — LEVOFLOXACIN 500 MG TABLET
ORAL_TABLET | ORAL | 0 refills | 0 days
Start: 2018-02-01 — End: 2018-02-02

## 2018-02-01 MED ORDER — FUROSEMIDE 20 MG TABLET
ORAL_TABLET | Freq: Every day | ORAL | 0 refills | 0 days | Status: CP
Start: 2018-02-01 — End: 2018-03-03

## 2018-02-01 MED ORDER — FERROUS SULFATE 325 MG (65 MG IRON) TABLET,DELAYED RELEASE
ORAL_TABLET | Freq: Two times a day (BID) | ORAL | 0 refills | 0 days | Status: CP
Start: 2018-02-01 — End: 2019-02-01

## 2018-02-01 NOTE — Unmapped (Signed)
Patient A&O x 4, VSS, afebrile, patient states she has no pain. Generalized edema, and weak pulses. Patient poor  PO intake, and has adequate UOP via foley.  Patient wound dressing are clean dray and intact. Staff will continue to monitor.  Problem: Adult Inpatient Plan of Care  Goal: Optimal Comfort and Wellbeing  Outcome: Progressing     Problem: Adult Inpatient Plan of Care  Goal: Absence of Hospital-Acquired Illness or Injury  Outcome: Progressing     Problem: Diabetes Comorbidity  Goal: Blood Glucose Level Within Desired Range  Outcome: Progressing     Problem: Skin Injury Risk Increased  Goal: Skin Health and Integrity  Outcome: Progressing     Problem: Fall Injury Risk  Goal: Absence of Fall and Fall-Related Injury  Outcome: Progressing

## 2018-02-01 NOTE — Unmapped (Signed)
84 year old woman mid abdomen initially 11/24/??2019. ??Family is at bedside, patient is a poor??informant.  ??  Initial history on admission  ??  1. ??Sepsis picture. ??She was initially admitted with a picture of sepsis. ??At that time,??one blood culture grew Klebsiella and the second which are grew Bacteroides. ??It seems unusual that Klebsiella would not grew out of both??flasks to indicate a????true positive culture. On the other hand,??Bacteroides can sometimes be difficult to culture. ??Patient also had a urinary tract infection which was secondary to pansensitive Pseudomonas, and also has a pressure ulcer on her back. ??She appears to responded to the combination of levofloxacin and metronidazole.  ??  2. ??Metastatic cancer. She??has known??metastatic??lung cancer with multiple??lung nodules.????There is, as well mass near the duodenum which appears to be localized near the duodenum. ??In addition there is a new mass in the left hepatic lobe??that??also appears to be metastatic. ??There is also a new enhancing lesion in the inferior pole the right kidney, is also concerning for a??metastasis versus a??renal malignancy. ??She was also shown to have a large amount of ascites, with thickened and edematous bowel.   ??  3. ??Diastolic heart failure. ??Patient has moderate pulmonary hypertension, moderate tricuspid regurgitation, and preserved ejection fraction.  ??  4. ??Anemia.????Patient has an anemia with a hematocrit of 20, the ferritin unfortunately is unfortunately not helpful,??since it is a phase reactant. ??The iron strongly suggest iron deficiency and this also implies the high probability of bleeding from the mass in the duodenum which may be infiltrating the duodenal wall.  ??  Past medical history. ??Patient never smoked or drank. ??Operations include a total melanoma, hysterectomy, cholecystectomy, and a branchial cleft cystectomy. ??Family history is positive for diabetes and hypertension. ??Allergies are to sulfa. ??Medications have been reviewed and confirmed in the workstation. ??No additions include cefepime, metronidazole and vancomycin.  ??  Review of systems. ??Patient does not feel short of breath this morning, does feel drowsy and sleepy but is not complaining of pain anywhere. ??Her daughter who is with her says that she has not had any chills or not aware fever, there is been no symptoms of seizures and she yesterday was not panting even when she was stating she was short of breath. ??There are no new focal neurological signs according to the family, and she appears to feel better this morning.  ??  Initial laboratory values. ??CTA scan shows no new nodules, or airspace disease nor other pulmonary emboli. ??A VBG shows a pH of 7.46, PCO2 36, and oxygen level of 41.????The conference of metabolic panel is normal except for a chloride of 114, BUN of 25, albumin of 1.5, total protein 3.9, AST of 39 and alk phos of 165. ??WBC is 7.1 although it had risen to 13.4 initially. ??Hematocrit once again 20.4, count 178,000. ??The urinalysis shows moderate amounts of protein, large amount of blood in the urine with RBCs scattered WBCs rare bacteria and number of granular casts. ??The INR is 1.85, but the patient is on apixaban.  ??  ??  Hospital course  ??  1. ??Acute dyspnea.    We still remain unclear as to the cause of her acute dyspneic episode, over this time we will have her return home by ambulance.  Again discussed with the family that the goals of objective care for now her comfort because of her significant metastatic cancer.  If the oxygen level suffered today without oxygen is correct, her dyspneic spell may well of been acute hypoxemia.  Perhaps would be  wise to discharge her with oxygen.  ??  2. ??Question of recurrent sepsis.    Once again 1 of the blood cultures growing gram-positive cocci in pairs, based on previous cultures this likely represents a contaminant.  ??  3. ??Urinary retention.????Patient has a chronic indwelling Foley because of her inability to void, it is felt that this is the source of her recent urinary tract infection with Pseudomonas.  Pseudomonas is very difficult to treat in a patient as long as they have an indwelling catheter, normally it is wise to remove the Foley and replace it.  Treatment and is only based on clinical symptomatology.  ??  4. ??Metastatic cancer. ??As noted in initial history, has known metastatic lung cancer that appears metastatic node only to the lung but also to the liver and to the skull and perhaps the kidney. ??She is currently on a tyrosine kinase inhibitor. ??She has a chronic indwelling right pleural catheter to drain a malignant pleural effusion. ??Is also noted were that the patient has a past history of melanoma dating back to 2006.  ??  5. ??Nutritional deficiency with hypoalbuminemia. ??Appears to be secondary not only to her cancer, but also to the loss of protein from the pleural space due to drainage of the pleural catheter.????In addition she has diffuse edema.  ??  6. ??Diastolic heart failure. ??The patient is known to have diastolic heart failure with pulmonary hypertension and evidence of right-sided overload.  ??  7. ??Minor cognitive disorder. ??She also has minor cognitive disorder, which the family notes has suddenly worsened since yesterday.  ??  8. ??Anemia.??  Hematocrit remains around 20.5, reticulocyte count has however been the upper limits of normal at around 100.  We have begun iron infusions now that her blood cultures remain negative.  ??  9. ??Fibrillation. ??She remains on apixaban as an anticoagulant. ??Her heart rate has maintained less than 100 with average values being around 85.  ??  10. ??Osteoporosis. ??Patient has osteoporosis with a T12 compression fracture.  ??  11. ??Diabetes. ??Since her admission the patient sugars have ranged from 185-287, her most recent hemoglobin A1c this month was 6.4%. ??She is currently on a sliding scale of rapid acting insulin. ??In July the albumin creatinine ratio was less than 0.6.  ??  12. ??Sacral pressure ulcer. 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 ??This is not been observed to lose but does have significant dead tissue overlying it requiring debridement.  ??  ??  Physical exam.  General.????    She is calm today, I think she is probably close to her baseline, and her daughter seems to agree.  Vital signs.  35.8, 100, 18, 61% on room air, although I think this is an error.  124/63.  Skin. ??There is a 6 x 4 cm stage IV pressure ulcer of the sacrum, with dead tissue overlying, but no drainage.????This has not changed.  HEENT. ??Current conjunctiva are normal, I do not feel the thyroid, neck veins are distended and increase on inspiration.  Respiratory. ??There are few scattered rales at the bases,??the patient really has a great deal of difficulty eating sitting up with assistance, and this exam is optimal. .  Cardiovascular.????????The rhythm is mostly regular this morning, I hear no murmurs or gallops.  Abdomen.????The liver and spleen are likely of normal size, there is substantial abdominal wall edema, bowel sounds are quiet, but there are no tender areas or masses felt.  GU. ??Foley catheter in place  Extremities. ??Pulses are very weak, 2+ edema in the legs and there is generalized anasarca.  Musculoskeletal. ??There is modest osteoarthritis of the knees and less of the hands, no joints are warm or swollen.  Psychiatric. ??Patient is somnolent, and not oriented, but speaking in sentences that are relevant.  Neurological. ??Cranial nerves appear intact, upper extremity strength is normal and tone is normal also. ??She is able to move the lower extremities but cannot cooperate with the sensory exam. ??Gait and station were not tested. ??Toes are downgoing.  ??  Impression.  1. ??Sudden onset of dyspnea, cause uncertain, possibly worsening heart failure or sepsis.  2. ??Past septic picture and possibly recurrent sepsis. ??Multiple possible sites including bacterial peritonitis (clinical picture however does not fit), gut translocation of bacteria, duodenal mass, and more likely urinary tract infection.  3. ??Metastatic non-small cell lung cancer with??new??deposits in the liver  4. 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 ??Mass in the in the lower pole of the right kidney, question metastasis or primary renal carcinoma.  6. ??Iron deficiency anemia due to bleeding presumably from the duodenal mass.  5. ??History??Pseudomonas??UTI.  6. ??Suspect??past??contamination of blood cultures  7. ??Stage IV pressure ulcer of the sacrum.  8. ??Osteoporosis with T12 compression fracture.  9. ??Diabetes mellitus  10. ??Cognitive disorder with superimposed mild delirium  11. ??Generalized anasarca and ascites due to albuminemia and possible sepsis picture  12.  Acute hypoxemia.  ??  Suggestions.?? She did have evidence of hypoxemia today, and I noticed that coming back from radiology she did not have any oxygen on.  It is conceivable that when she was moved to the car and sitting the air that she became hypoxic and this accounts for the episode to resolve.  It may be well to her advantage to send her home on oxygen to avoid any further returns to the emergency department for dyspnea.  I think if we can arrange the home details the family is ready to consider comfort care, and she may be discharged.

## 2018-02-01 NOTE — Unmapped (Signed)
Reviewed plan of care for today with patient and family.  Discussed skin protection, positioning, drain care, increased PO intake, pain management, and discharge planning.         Problem: Adult Inpatient Plan of Care  Goal: Plan of Care Review  Outcome: Progressing  Goal: Patient-Specific Goal (Individualization)  Outcome: Progressing  Goal: Absence of Hospital-Acquired Illness or Injury  Outcome: Progressing  Goal: Optimal Comfort and Wellbeing  Outcome: Progressing  Goal: Readiness for Transition of Care  Outcome: Progressing  Goal: Rounds/Family Conference  Outcome: Progressing     Problem: Diabetes Comorbidity  Goal: Blood Glucose Level Within Desired Range  Outcome: Progressing     Problem: Skin Injury Risk Increased  Goal: Skin Health and Integrity  Outcome: Progressing     Problem: Self-Care Deficit  Goal: Improved Ability to Complete Activities of Daily Living  Outcome: Progressing     Problem: Fall Injury Risk  Goal: Absence of Fall and Fall-Related Injury  Outcome: Progressing

## 2018-02-01 NOTE — Unmapped (Signed)
OCCUPATIONAL THERAPY  Evaluation (01/31/18 1610)    Patient Name:  [name] [name]       Medical Record Number: [id number]   Date of Birth: 02-Oct-1930  Sex: Female          OT Treatment Diagnosis:  Pt was found to be tachycardic to 190s initially, now hypotensive, tachycardic concerning for recurrent sepsis.    Assessment  Adelaida VETA DAMBROSIA is a 84 y.o. female with PMHx stage IV NSCLC, T2DM, afib that presented to [facility] with acute onset shortness of breath when climbing into the car to discharge from the hospital today. In the ED she was found to be tachycardic to 190s initially. Now hypotensive, tachycardic concerning for recurrent sepsis. Sick on admission. Family express desire to care for pt at home but are not receptive to hospice care at this time. OT providing education on recommended home DME given pt's overall functional decline and for management of stage IV sacral wound including ROHO style pressure relieving w/c cushion, fully electric hospital bed with air mattress, ramp installation at home, possible hoyer lift for use with transfers, and stretcher transport to home. Family worried initially that pt won't agree to use of hospital bed as pt strongly prefers her king size bed, but pt tells OT 'if I have to, I'll use it'. Family remains hopeful re: pt's overall prognosis and requests HHPT services. Will keep on caseload with limited scope of family training for assisting pt with BADLs and bed mobility.     Today's Interventions: Education - Family / caregiver;Positioning;Safety education;Therapeutic exercise    Activity Tolerance During Today's Session  Patient limited by fatigue(family education primarily, limited direct pt treatment)    Plan  Planned Frequency of Treatment:  1-2x per day for: 2-3x week       Planned Interventions:  Education - Family / caregiver;ADL retraining;Safety education;Therapeutic exercise    Post-Discharge Occupational Therapy Recommendations:  OT Post Acute Discharge Recommendations: 3x weekly    ;    OT DME Recommendations: Hospital Bed(fully electric hospital bed with air mattress, pressure relieving w/c cushion, home health to assess for use of hoyer lift)    GOALS:   Patient and Family Goals: 'I think she can get back to walking and doing things for herself'            Short Term:  Caregiver will provide teach back for appropriate/safe technique for bed level bathing/dressing/toileting routine given minimal VCs.   Time Frame : 4 days  Pt/cg will verbalize understanding of recommended DME for discharge to home environment.   Time Frame : 4 days  Pt/cg will demonstrate performance of HEP for BUE AROM using written program.    Time Frame : 3 days                  Prognosis:  Fair  Positive Indicators:  family support, PLOF  Barriers to Discharge: Cognitive deficits;Endurance deficits;Inability to safely perform ADLS;Impaired Balance    Subjective  Current Status presents/returned to supine in bed at OT arrival, all needs within reach, RN updated.  Prior Functional Status At baseline, pt performs ADLs with mobility assistance from family members and uses a Secondary school teacher (i.e. family would walk with pt to bathroom then leave her alone until she was ready to stand up; family assists pt getting to shower chair, then pt bathes herself). Family reports that they encourage pt to do as much on her own as possible.     Medical Tests / Procedures:  reviewed in Epic  Services patient receives: PT;OT  Patient / Caregiver reports: 'she was up and walking around 1 week ago'    Past Medical History:   Diagnosis Date   ??? Atrial fibrillation (CMS-HCC)    ??? Cancer (CMS-HCC)    ??? Diabetes mellitus (CMS-HCC)    ??? Hypertension    ??? Metastatic lung cancer (metastasis from lung to other site) (CMS-HCC)    ??? Pulmonary embolism (CMS-HCC)     Social History     Tobacco Use   ??? Smoking status: Never Smoker   ??? Smokeless tobacco: Never Used   Substance Use Topics   ??? Alcohol use: No      Past Surgical History:   Procedure Laterality Date   ??? CHG RAD GUIDED,PERCUT DRAINAGE,W/CATH PLACE Right 06/19/2017    Procedure: RADIOLOGICAL GUIDANCE, FOR PERCUTANEOUS DRAINAGE, WITH PLACEMENT OF CATHETER, RAD S&I;  Surgeon: Sherwood Gambler, MD;  Location: [facility]ON[facility] PROCEDURE LAB [facility];  Service: Pulmonary   ??? CHOLECYSTECTOMY     ??? HYSTERECTOMY     ??? neck surgey     ??? PR INSERTION INDWELLING TUNNELED PLEURAL CATHETER Bilateral 06/19/2017    Procedure: INSERTION OF INDWELLING TUNNELED PLEURAL CATHETER WITH CUFF WITH MODERATE SEDATION;  Surgeon: Sherwood Gambler, MD;  Location: BRON[facility] PROCEDURE LAB [facility];  Service: Pulmonary   ??? PR INSERTION INDWELLING TUNNELED PLEURAL CATHETER Left 10/26/2017    Procedure: INSERTION OF INDWELLING TUNNELED PLEURAL CATHETER WITH CUFF;  Surgeon: Mercy Moore, MD;  Location: MAIN OR [facility];  Service: Pulmonary   ??? PR MOD SED SAME PHYS/QHP EACH ADDL 15 MINS  06/19/2017    Procedure: MODERATE SEDATION SERVICES PROVIDED BY SAME PHYSICIAN OR OTHER QUALIFIED HEALTH CARE PROFESSIONAL PERFORMING THE DIAGNOSTIC OR THERAPEUTIC SERVICE THAT SEDATION SUPPORTS, EACH ADDITIONAL 15 MINS;  Surgeon: Sherwood Gambler, MD;  Location: [facility]ON[facility] PROCEDURE LAB [facility];  Service: Pulmonary   ??? PR MOD SED SAME PHYS/QHP INITIAL 15 MINS 5/> YRS  06/19/2017    Procedure: MODERATE SEDATION SERVICES PROVIDED BY SAME PHYSICIAN OR OTHER QUALIFIED HC PROFESSIONAL PERFORMING THE DIAGNOSTIC OR THERAPEUTIC SERVICE THAT SEDATION SUPPORTS, INIT 15 MINS, PT AGE 39 YEARS OR OLDER;  Surgeon: Sherwood Gambler, MD;  Location: [facility]ON[facility] PROCEDURE LAB [facility];  Service: Pulmonary    Family History   Problem Relation Age of Onset   ??? Cancer Brother         lung   ??? Cancer Daughter         lung   ??? Cancer Daughter         breast        Sulfasalazine     Objective Findings  Precautions / Restrictions  Falls precautions;Isolation precautions    Weight Bearing  Non-applicable(stage IV sacral wound, avoid shearing forces)    Required Braces or Orthoses  Non-applicable    Communication Preference       Pain  no c/o pain    Equipment / Environment  Supplemental oxygen;Vascular access (PIV, TLC, Port-a-cath, PICC);Telemetry;Foley(2L O2)    Living Situation   Living environment: House   Lives With: Family;Daughter(son and multiple other family members are close by and provide assist with caring for pt)   Home Living: One level home;Stairs to enter with rails   Equipment available at home: [employer];Wheelchair-manual;Shower chair;Bedside commode   Rail placement (outside): Bilateral rails        Cognition   Orientation Level:  (oriented to self)   Arousal/Alertness:  Inconsistent responses to stimuli  Attention Span:  Difficulty attending to directions   Memory:  Unable to assess   Following Commands:  Follows one step commands with repetition   Safety Judgment:  Unable to assess   Awareness of Errors:  Unable to assess   Problem Solving:  Assistance required to generate solutions;Assistance required to implement solutions   Comments: difficulty following directions for participation in BUE AROM exercises    Vision / Perception  Vision: Wears glasses for reading only          Hand Function  Hand Dominance: right handed  impaired bil fmc    Skin Inspection  Some scabbing on back and arms. Sacral wound covered. significant edema noted in bil UEs    ROM / Strength/Coordination  UE ROM/ Strength/ Coordination: BUE 50% hand AROM, 50% full AROM, 50% shoulder AROM  LE ROM/ Strength/ Coordination: Defer to PT    Sensation:  light touch intact BUEs    Balance:  not addressed, from PT treatments earlier in week max - total A for bed mobility and static sitting balance    Mobility/Gait/Transfers: NT - stage IV sacral ulcer    ADL:  Bathing: max A  Grooming: mod A  Dressing: max A  Eating: demos holding cup and taking sip from straw with SBA but anticipate min - mod A needed for manipulating utensils given impaired fmc  Toileting: max A  IADLs: NT Vitals/ Orthostatics:  At Rest: monitored by telemetry           Interventions Performed During Today's Session: caregiver education re: home DME, performance of bed level BADLs, demo of BUE gentle AROM per family request for 'exercise'         Eval Duration (OT): 30 Min.    Medical Staff Made Aware: RN Wynona Canes    I attest that I have reviewed the above information.  Signed: Ervin Knack, OT  Filed 01/31/2018

## 2018-02-02 ENCOUNTER — Encounter: Admit: 2018-02-02 | Discharge: 2018-03-07 | Payer: MEDICARE

## 2018-02-02 ENCOUNTER — Inpatient Hospital Stay: Admit: 2018-02-02 | Discharge: 2018-03-07 | Payer: MEDICARE

## 2018-02-02 DIAGNOSIS — I4891 Unspecified atrial fibrillation: Secondary | ICD-10-CM

## 2018-02-02 DIAGNOSIS — I959 Hypotension, unspecified: Secondary | ICD-10-CM

## 2018-02-02 DIAGNOSIS — J910 Malignant pleural effusion: Secondary | ICD-10-CM

## 2018-02-02 DIAGNOSIS — L97322 Non-pressure chronic ulcer of left ankle with fat layer exposed: Secondary | ICD-10-CM

## 2018-02-02 DIAGNOSIS — I10 Essential (primary) hypertension: Secondary | ICD-10-CM

## 2018-02-02 DIAGNOSIS — D510 Vitamin B12 deficiency anemia due to intrinsic factor deficiency: Secondary | ICD-10-CM

## 2018-02-02 DIAGNOSIS — E1165 Type 2 diabetes mellitus with hyperglycemia: Secondary | ICD-10-CM

## 2018-02-02 DIAGNOSIS — L89150 Pressure ulcer of sacral region, unstageable: Secondary | ICD-10-CM

## 2018-02-02 DIAGNOSIS — R339 Retention of urine, unspecified: Secondary | ICD-10-CM

## 2018-02-02 DIAGNOSIS — Z87440 Personal history of urinary (tract) infections: Secondary | ICD-10-CM

## 2018-02-02 DIAGNOSIS — M5135 Other intervertebral disc degeneration, thoracolumbar region: Secondary | ICD-10-CM

## 2018-02-02 DIAGNOSIS — Z4801 Encounter for change or removal of surgical wound dressing: Secondary | ICD-10-CM

## 2018-02-02 DIAGNOSIS — Z466 Encounter for fitting and adjustment of urinary device: Secondary | ICD-10-CM

## 2018-02-02 DIAGNOSIS — C787 Secondary malignant neoplasm of liver and intrahepatic bile duct: Secondary | ICD-10-CM

## 2018-02-02 DIAGNOSIS — Z86711 Personal history of pulmonary embolism: Secondary | ICD-10-CM

## 2018-02-02 DIAGNOSIS — Z794 Long term (current) use of insulin: Secondary | ICD-10-CM

## 2018-02-02 DIAGNOSIS — Z9181 History of falling: Secondary | ICD-10-CM

## 2018-02-02 DIAGNOSIS — C7951 Secondary malignant neoplasm of bone: Secondary | ICD-10-CM

## 2018-02-02 DIAGNOSIS — C3412 Malignant neoplasm of upper lobe, left bronchus or lung: Principal | ICD-10-CM

## 2018-02-02 DIAGNOSIS — C7931 Secondary malignant neoplasm of brain: Secondary | ICD-10-CM

## 2018-02-02 DIAGNOSIS — Z7901 Long term (current) use of anticoagulants: Secondary | ICD-10-CM

## 2018-02-02 MED ORDER — COLLAGENASE CLOSTRIDIUM HISTOLYTICUM 250 UNIT/GRAM TOPICAL OINTMENT
Freq: Every day | TOPICAL | 0 refills | 0 days | Status: CP
Start: 2018-02-02 — End: 2018-03-01

## 2018-02-02 NOTE — Unmapped (Signed)
[name] [name] Discharge Summary    Identifying Information:   [name] [name]  05-04-1930  [id number]    PCP: [name] Lodge, MD    Admit date: 01/29/2018    Discharge date: 02/01/2018     Discharge Service: [name] (MDA)    Discharge Attending Physician: [name] Edwards, MD    Discharge to: Home with Home Health and/or PT/OT    Discharge Diagnoses:  Principal Problem:    Hypotension  Active Problems:    SOB (shortness of breath)    Diabetes mellitus, type 2 (CMS-HCC)    Metastatic lung cancer (metastasis from lung to other site), unspecified laterality (CMS-HCC)    Anemia    Pressure injury of sacral region, stage 2 (CMS-HCC)    Tachycardia  Resolved Problems:    * No resolved hospital problems. *    Outpatient Provider Issues to Follow Up  -- PCP follow up with [name] [name] scheduled for 02/06/18.  -- Follow up results of upper extremity PVL dopplers  -- Recommend CBC and BMP to monitor Hgb and potassium.  -- Recommend UA and urine culture at 02/06/18 appointment. Also recommend trial of void as outpatient.  -- Follow up evaluation with PCP for possible adjustments to Lasix dosing (pt discharged on 20mg  daily).  -- Final dose of levofloxacin on 02/02/18.  -- Metronidazole last day on 02/04/18.  -- Recommend continuing to address goals of [name]. Patient and family prefer for her to be at home and may at some point want to transition to a more comfort [name] approach, however per GOC discussions with our team in the hospital, family preferred to continue therapies at this time pending further discussions with pt's oncologist      Hospital Course:   [name] [name] is an 84 yo woman with a??PMHx of metastatic NSCLC (Stage IV, on crizotinib) c/b b/l malignant effusions s/p bilateral pleurX catheters, hx multiple LE ulcers, AFib on elequis,??and??anemia who re-presented after discharge on 01/29/18??with??acute shortness of breath, hypotension, and tachycardia.  ??  Acute Dyspnea, Possible Recurrent Sepsis  Patient with sudden onset shortness of breath after she transferred to the car to be discharged from the hospital on 01/29/18. She was originally admitted on 01/21/18 and treated for septic shock ([name] [name] and [name] [name] bacteremia, also with [name] [name] UTI). On re-admission exam, patient was tachycardic and hypotensive. She was given IV fluids with improvement in hemodynamic status. CXR unchanged and CTA negative for PE.??Lactate and??WBC initially elevated??but??subsequently normalized.??She also has a deep sacral ulcer, but no evidence of active infection. Patient was afebrile and without chills. Troponins negative. EKG showed Afib with RVR. Precise etiology for sudden onset dyspnea unclear, but possibilities include recurrent sepsis, worsening heart failure (history of diastolic dysfunction), flash pulmonary edema, and anxiety (her son believes she was quite anxious as she was getting into the car to leave hospital). Throughout admission, [name] was hemodynamically stable. One blood culture grew gram positive cocci in pairs (likely a contaminant based on previous cultures) while the second culture had no growth at 48 hours. The patient was initially started on vanc/cefepime/flagyl, but antibiotics were de-escalated to the regimen she was recently discharged on, levofloxacin (last day 12/6) and metronidazole (last day 12/8). Urine culture returned positive for candida glabrata on day of discharge. Given lack of clarity as to whether this isolate was a contaminant vs. Infection in this otherwise hemodynamically stable patient, her Foley catheter was exchanged for a new one and she was given a dose of IV micafungin 100mg  x1 prior  to discharge. Will communicate this to patient's PCP as well, and likely warrants repeat UA/UCx at follow up visit.   ??  Anasarca  Patient was volume overloaded with????generalized edema and JVD.??Suspect related to??IVF resuscitation??on admission, as well as significant hypoalbuminemia.??Blood pressures were soft, but stable throughout admission. She received one dose of IV diuresis with Lasix 20mg  and was discharged on furosemide 20mg  PO daily. Potassium decreased to 2.8 on day of discharge, likely due to IV loop diuretic. She has close follow up with PCP for repeat BMP and furosemide dose adjustments as necessary. Was instructed to continue previously prescribed potassium supplement (10 BID)  ??  Diminished Pulses in Left Upper Extremity  Patient with significantly diminished pulses in left upper extremity. Bilateral upper extremity dopplers were obtained due to difficulty with initial unilateral duplex read. Results pending at the time of discharge, will need to be followed up. Return precautions provided.   ??  Deep Sacral Decubitus Ulcer  No evidence of active infection, but??wound??remains at high risk of infection. Necrotic tissue was debrided from wound base??on 01/30/18. Collagenase moist dressings were utilized throughout admission.  ??  Stage IV NSCLC, Bilateral Pleural Effusions  Patient with history of metastatic lung cancer with known metastases to liver and bilateral malignant pleural effusions. PleurX catheters are place and drained every other day. She does follow with [name] [name] for [name] [name]. Crizotinib was held in setting of possible infections. She has follow up with Oncology scheduled for February 2020. Would encourage continued discussion of a possible transition to comfort-based [name] given the patient's recurrent hospitalizations and overall decline.   ??  Urinary Retention  During prior admission, patient failed trial of void due to bladder distention and was discharged with foley catheter in place. Foley changed on day of discharge (02/01/18). Recommend trial of void as outpatient.   ??  Anemia  Likely a combination of iron deficiency and anemia of chronic inflammation based on iron studies. During prior admission hemoglobin was stable around 8s. During this hospitalization, Hgb hovered around 7 and was 7.2 on day of discharge. 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 Calculated iron deficit was approximately 930mg . Initiated treatment with IV ferric gluconate 125mg  x3 doses prior to discharge. Patient was discharged on ferrous sulfate 325mg  BID, though may benefit from scheduling further outpatient IV iron. Also warrants discussion of whether or not to continue apixaban (see below), however will opt to continue for now.   ??  Atrial Fibrillation  Heart rate under fairly good control in the 90-100 range, after initial elevation in the ED. Patient continued on apixaban for anti-coagulation. Given possibility of bleeding from duodenal mass, benefits of anticoagulation may not outweigh risks of bleeding and also may depend on outcome of future goals of [name] discussions.   ??  T2DM  Hemoglobin A1c 6.4 on 01/01/18. Prior to previous admission, patient had been on home insulin Novolog 70/30 8 units BID with some borderline hypoglycemia at home.??She was treated with sliding scale insulin with blood glucose under reasonably good control during admission. Patient discharged on home regimen.    ??  **GERIATRIC ASSESSMENT:  During her admission 12/31/17-01/02/18 she was very confused and weak, but she has a lot of family support and was able to be discharged home. Her family reports that she did improve after going home and was able to go out to eat with them. Then a few days before 11/23 presentation she had decreased oral intake and worsening fatigue/lethargy. She became acutely short  of breath after getting into the car on 01/29/18 to be discharged, which led to her re-admission.  ??  See note from recent hospitalization:  Ms. Vohra is a pleasant 84 year-old female with stage IV NSCLC with brain and liver mets as well as malignant pleural effusions s/p pleurx catheters. She was diagnosed with lung cancer this year. Prior to that, her family reports that she was very independent. She has a home in the [place] area. She was doing her own shopping and preparing large meals for family. She has had 6 children, 4 living, and has a lot of support from family that has allowed her to continue living at home since her cancer diagnosis. Her daughter came down from [place] in June to live with her to help as her function declined. Her son and other family members live very close to her as well. Her son, [name], is her HCPOA and he manages her finances and organizes her medications. Her daughter reports that she has had more weakness over the last month and is needing more help with getting to the toilet, bathing, and transfers. She uses a walker but every once in a while will walk without any assistive devices. Her family gives her a bed bath most days and twice a week will get her into the shower but Ms. Decoste sometimes gets angry about the shower.  ??    Functional Assessment                                         ADLs:  IADLs:   Feeding: Requires Assistance  Dressing: Dependent  Ambulation: Dependent  Toileting: Dependent  Bathing: Dependent  ?? Using the phone: Requires Assistance  Shopping: Dependent  Meal preparation: Dependent  Medication mgmt: Dependent  Managing finances: Dependent  Housework: Dependent  Transportation (driving or navigating public transit): Dependent   ??  Living situation: Patient lives in own home with grandsons. Her daughter is also staying with her.   ??  Changes in ADLs during hospitalization: None  ??  Assistive devices: Walker  ??  Additional services recommended at discharge: Home health PT, OT, and wound [name]  ??  Cognitive Assessment   ??  Delirium Assessment: CAM (Confusion Assessment Method):    On discharge, the patient did show evidence of delrium, although improved overall. Discharging mental status: Improved.    ??  Other cognitive assessment:  In March 2019 there was documentation of some mild cognitive disorder but do not see a SLUMS or MOCA documented. Advance [name] Planning   ??  See ACP Note by Dr. Doreatha Martin 01/16/18:??Inquired regarding whether patient has any [name] preferences in the event that she becomes seriously ill. ??Patient son states that she has completed a living well though it is currently at home. ??He plans to bring to her next visit. ??Son's understanding is that patient wants to ride this out in terms of receiving all treatments until there is nothing left. ??This would include being on a respirator. ??Discussed low likelihood that [name] in intensive [name] unit would enable patient to return to her current level of function and that she would most likely not survive. ??She states wanting to wait and see. Son states that patient is living for her family and particularly her great grandson who is 69 years old and for whom she began providing [name] when he was 33 weeks old secondary  to a mother who was living with addiction.    ??  Code Status: Full Code  ??  Surrogate decision maker: Patient's Son, Darrion Macaulay  ??  Emergency Contact:  Extended Emergency Contact Information  Primary Emergency Contact: Tierney,Jessie   [place]  Home Phone: [phone]  Mobile Phone: [phone]  Relation: Son  Secondary Emergency Contact: Viviann Spare  Mobile Phone: [phone]  Relation: Daughter    Procedures:  No admission procedures for hospital encounter.  _____________________________________________________________________________  Discharge Day Services:  BP 124/63  - Pulse 100  - Temp 35.8 ??C (Oral)  - Resp 18  - Ht 162.6 cm (5' 4.02)  - Wt 63.9 kg (140 lb 14 oz)  - SpO2 92%  - BMI 24.17 kg/m??    Last 5 Recorded Weights    01/31/18 1227 01/31/18 1500   Weight: 63.9 kg (140 lb 14 oz) 63.9 kg (140 lb 14 oz)       Pt seen on the day of discharge and determined appropriate for discharge.    Condition at Discharge: stable    Length of Discharge: I spent greater than 30 mins in the discharge of this patient. _____________________________________________________________________________  Discharge Medications:     Your Medication List      START taking these medications    collagenase 250 unit/gram ointment  Commonly known as:  SANTYL  Apply topically daily. To sacral wound  Start taking on:  February 02, 2018     ferrous sulfate 325 (65 FE) MG EC tablet  Take 1 tablet (325 mg total) by mouth Two (2) times a day.     furosemide 20 MG tablet  Commonly known as:  LASIX  Take 1 tablet (20 mg total) by mouth daily.        CONTINUE taking these medications    ascorbic acid (vitamin C) 500 MG tablet  Commonly known as:  VITAMIN C  Take 1 tablet (500 mg total) by mouth daily.     BD ULTRA-FINE SHORT PEN NEEDLE 31 gauge x 5/16 Ndle  Generic drug:  pen needle, diabetic  USE AS DIRECTED. USE 4 NEEDLES A DAY WITH DIABETIC MEDICATION PENS AS DIRECTED     blood sugar diagnostic Strp  Check 4x/day as instructed     blood sugar diagnostic Strp  Commonly known as:  glucose blood  Check blood sugar 4 times daily.     blood-glucose meter Misc  1 each by Miscellaneous route Four (4) times a day. for 1 day     blood-glucose meter kit  Use as instructed     calcium carbonate-vitamin D3 600 mg (1,500 mg)-800 unit Chew  Commonly known as:  CALTRATE 600 PLUS D  Chew 1 tablet Two (2) times a day.     ELIQUIS 5 mg Tab  Generic drug:  apixaban  Take 5 mg by mouth Two (2) times a day. .     lancets Misc  1 each by Miscellaneous route Four (4) times a day.     lancets Misc  Commonly known as:  ACCU-CHEK SOFTCLIX LANCETS  Use 4 times daily.     levoFLOXacin 500 MG tablet  Commonly known as:  LEVAQUIN  Take 1 tablet (500 mg total) by mouth daily. for 1 day     lidocaine 4 % patch  Place 1 patch on the skin daily as needed.     metFORMIN 500 MG 24 hr tablet  Commonly known as:  GLUCOPHAGE-XR  TAKE 2 TABLETS (1,000  MG TOTAL) BY MOUTH TWO (2) TIMES A DAY.     metroNIDAZOLE 500 MG tablet  Commonly known as:  FLAGYL  Take 1 tablet (500 mg total) by mouth Three (3) times a day. for 3 days     mirtazapine 7.5 MG tablet  Commonly known as:  REMERON  Take 1 tablet (7.5 mg total) by mouth nightly.     NovoLOG Mix 70-30FlexPen U-100 100 unit/mL (70-30) injection pen  Generic drug:  insulin aspart protamine-insulin aspart  Inject 0.08 mL (8 Units total) under the skin two (2) times a day.     potassium chloride SA 20 MEQ tablet  Commonly known as:  KLOR-CON  Take 10 mEq by mouth Two (2) times a day. 1/2 tablet = 10 mEq     ranitidine 150 MG tablet  Commonly known as:  ZANTAC  Take 150 mg by mouth Two (2) times a day.     sodium hypochlorite 0.125 % Soln  Commonly known as:  DAKIN's (QUARTER STRENGTH)  Apply topically Two (2) times a day.     XALKORI 250 mg capsule  Generic drug:  crizotinib  Take 1 capsule (250 mg total) by mouth Two (2) times a day.          _____________________________________________________________________________  Pending Test Results (if blank, then none):   Order Current Status    Blood Culture Preliminary result    Blood Culture #1 Preliminary result          Most Recent Labs:  Microbiology Results (last day)     Procedure Component Value Date/Time Date/Time    Blood Culture #1 [[id number]] Collected:  01/29/18 1724    Lab Status:  Preliminary result Specimen:  Blood from 1 Peripheral Draw Updated:  02/01/18 1745     Blood Culture, Routine No Growth at 72 hours    Blood Culture [[id number]] Collected:  01/29/18 2229    Lab Status:  Preliminary result Specimen:  Blood from 1 Peripheral Draw Updated:  02/01/18 1135     Blood Culture, Routine NO GROWTH TO DATE     Gram Stain Result No organisms seen     Comment: Edited result: Previously reported as Gram positive cocci in pairs on 02/01/2018 at 0044 EST.             Lab Results   Component Value Date    WBC 6.4 02/01/2018    HGB 7.2 (L) 02/01/2018    HCT 20.4 (L) 02/01/2018    PLT 170 02/01/2018       Lab Results   Component Value Date    NA 135 02/01/2018    K 2.8 (L) 02/01/2018    CL 108 (H) 02/01/2018    CO2 22.0 02/01/2018    BUN 24 (H) 02/01/2018    CREATININE 0.84 02/01/2018    CALCIUM 8.5 02/01/2018    MG 1.9 01/29/2018       Lab Results   Component Value Date    ALKPHOS 224 (H) 02/01/2018    BILITOT 0.3 02/01/2018    BILIDIR <0.10 01/01/2018    PROT 4.0 (L) 02/01/2018    ALBUMIN 1.6 (L) 02/01/2018    ALT 25 02/01/2018    AST 30 02/01/2018       Lab Results   Component Value Date    PT 21.6 (H) 01/29/2018    INR 1.85 01/29/2018    APTT 36.0 01/29/2018     Hospital Radiology:  Xr Chest Portable    Result Date: 01/29/2018  EXAM: XR CHEST PORTABLE DATE: 01/29/2018 5:02 PM ACCESSION: [id number] UN DICTATED: 01/29/2018 5:03 PM INTERPRETATION LOCATION: [facility] CLINICAL INDICATION: 84 years old Female with SHORTNESS OF BREATH  COMPARISON: 01/22/2018 TECHNIQUE: Portable Chest Radiograph. FINDINGS: Defibrillator pad overlies the right chest wall. Unchanged left-sided chest tube. Right-sided chest tube has migrated inferiorly with distal tubing lying over the right hemidiaphragm. Streaky bibasilar opacities, greater on the left. Small left and likely trace right pleural effusions. Trace left pneumothorax. No right pneumothorax. Stable cardiomediastinal silhouette. Multiple left-sided rib fractures are again visualized.     Small left apical pneumothorax. Low lung volumes with hazy bibasilar opacities, greater on the left with small left and trace right pleural effusions.      Pvl Arterial Duplex Upper Extremity Left    Result Date: 02/01/2018    Peripheral Vascular Lab     [address]   [place], [place] [address]  PVL ARTERIAL DUPLEX UPPER EXTREMITY LEFT Patient Demographics Pt. Name: KIMBERLEIGH MEHAN Location: [facility] Inpatient MRN:      [id number]             Sex:      F DOB:      [birth year]          Age:      74 years  Study Information Authorizing         [phone] [facility]      Performed Time       01/31/2018 7:10:44 Provider Name       [facility]                                     PM Ordering Physician Kai Levins     Patient Location     [facility] Accession Number    [id number] UN         Technologist         Delee Whitaker                                                                RDCS Diagnosis:                                Assisting            Charlett Blake,                                           Technologist         RVT Ordered Reason For Exam: decreased pulse on L  Other Indication: Weak pulses. History:          Afib, PVC. Left arm swelling.  Risk Factors: Hyperlipidemia.  Examination Protocol: Duplex is used to assess specific areas of concern per ordering physician. If no specific area is identified prior to exam, Doppler spectral waveforms from bilateral brachial, radial, and ulnar arteries. Additional Doppler waveforms may be obtained from the axillary and subclavian artery at the discretion of the technologist. Further duplex scanning may be performed at any level of the upper extremity at the discretion  of the technologist/ordering physician. Blood pressures may be obtained from one or more levels of the arm bilaterally. Findings  Right  Not assessed. Left: Duplex waveforms in the subclavian and axillary artery appear multiphasic       with sharp systolic upstrokes. Brachial and radial artery Doppler velocity       waveforms appears dampened.        Patient uncooperative. Refused to lower head of bed down.  Summary of Findings Right: Not assessed. Left: Evidence of obstruction proximal to the brachial artery. See repeat exam       of 02/01/2018 for additional information.  Final Interpretation  Left: Obstruction proximal to the brachial artery. See followup       study  Electronically signed by [id number] Jodell Cipro MD on 02/01/2018 at 3:14:44 PM.   --------------------------------------------------------------------------------   Final     Cta Chest W Contrast    Result Date: 01/30/2018  EXAM: CTA CHEST W CONTRAST DATE: 01/29/2018 5:55 PM ACCESSION: [id number] UN DICTATED: 01/29/2018 6:04 PM INTERPRETATION LOCATION: [facility] CLINICAL INDICATION: 84 years old Female with SOB  COMPARISON: Same day chest radiograph, chest CT 01/12/2018 TECHNIQUE: A spiral CTA scan was obtained with IV contrast from the lung apices to the lung bases. Images were reconstructed in the axial plane. Multiplanar reformatted and MIP images were provided. FINDINGS: LINES: Tunneled bilateral pleural catheters. PULMONARY ARTERIES: No pulmonary embolism, however exam is significantly degraded by motion. AIRWAYS, LUNGS, PLEURA: Clear central tracheobronchial tree.  Left basilar atelectasis. Bilateral moderate pleural effusions, unchanged Unchanged appearance of left upper lobe mass with evidence of endobronchial invasion (5:41). Unchanged appearance of right upper lobe pulmonary nodule (5:40). No new focal consolidative opacities. No pneumothorax MEDIASTINUM: Normal heart size. No pericardial effusion.  Normal caliber thoracic aorta. Aortic arch and coronary artery calcifications. No mediastinal lymphadenopathy. IMAGED ABDOMEN: Moderate volume ascites. SOFT TISSUES/BONES: Similar appearance of multinodular goiter. Unchanged lipoma along the right chest wall. Redemonstrated healing left third through 10th rib fractures. No new fractures are identified. Moderate bilateral shoulder osteoarthrosis. Moderate multilevel degenerative disease of the thoracolumbar spine. Unchanged compression deformity of the T12 vertebral body.     --No pulmonary embolism. --Unchanged size and appearance of bilateral upper lobe pulmonary masses. --Bilateral moderate pleural effusions. Bibasilar atelectasis. --No new sites of metastatic disease are identified. --Moderate abdominal ascites.    Ed Pocus Rush Protocol Emergency    Result Date: 01/31/2018  Limited Cardiac Ultrasound (CPT [phone]) Indication: A focused ultrasound exam of the heart was performed to evaluate for pericardial effusion, tamponade, severe hypovolemia, or gross abnormalities of cardiac anatomy or function in this patient. The ultrasound was performed with the following indications, as noted in the H&P: Dyspnea Identified structures: The pericardial sac, myocardium, and 4 chambers were identified using the following views: parasternal long axis, parasternal short axis, apical 4-chamber and IVC (long axis) Findings: Exam of the above structures revealed the following findings:  Pericardial effusion: Present  with a SMALL effusion   Pericardial tamponade: N/A  Global LV function: Normal  Right ventricular size: Normal   Signs of RV strain: N/A  IVC: Normal  Other findings: NA Limitations: None. Impression: No sonographic evidence of significant cardiac dysfunction and No sonographic evidence of significant pericardial effusion Other: NA Interpreted by: Raynald Blend, MD Quality Assurance  After review of the point-of-[name] ultrasound performed in this case I assess the overall image quality as: Image quality: Minimal criteria met for diagnosis, all structures imaged well and diagnosis easily supported  The accuracy  of interpretation of images as presented reflects a true positive for small pericardial effusion. This study does  meet minimum criteria for credentialing and billing. Marjory Sneddon, MD       _____________________________________________________________________________  Discharge Instructions:   Activity Instructions     Activity as tolerated                    Follow Up instructions and Outpatient Referrals     Discharge instructions      Why was I admitted?     It was a pleasure taking [name] of you at [facility]. You were admitted due to shortness of breath, weakness, and looking unwell after leaving the hospital. We kept a close eye on you while you were in the hospital and thankfully your blood pressure and heart rate looked good while you were here. You did not have any fevers or trouble breathing while you were in the hospital. We did not have any concerns for an additional infection in your blood and were able to switch back to the oral antibiotics that you were taking before. The lab work did show a new fungal infection in your bladder, so we gave you one dose of an anti-fungal medicine to treat that before you were discharged. We also changed your Foley catheter out for a fresh one. Because you have iron deficiency anemia, we gave you some IV iron in the hospital and sent you home with oral iron tablets to take. We also did an ultrasound of your left arm because your pulse tends to be weaker on that side. We will ask your PCP to follow up on the final results.     Medication Changes  - You have one more dose to take of the Levaquin (also called levofloxacin) antibiotic. Please take this tomorrow morning (02/02/18).  - For the Flagyl (also called metronidazole) antibiotic, please take one dose tonight (02/01/18) before bed. You will then take this 3 times per day (morning, afternoon, and evening) on December 6th, 7th, and 8th.  - We are restarting your Lasix (water pill) at 20mg  per day. Please call your primary [name] doctor if your swelling worsens or does not improve, or if you develop symptoms of low blood pressure such as dizziness, lightheadedness, or weakness.  - We gave you IV iron in the hospital and are sending you home with oral iron tablets. Please take 325mg  per day.  -- Please follow the wound [name] instructions for the ulcer on your backside. Apply collagenase ointment with each dressing change for your sacral wound.  - Please take all other medications as prescribed and as detailed in this After Visit Summary.    What should I watch for in the next few weeks?  After you return home, it is important to monitor for worsening shortness of breath, chest pain, and fevers. Please also monitor for symptoms from anemia such as feeling tired. Also, it is important to watch out for urinary symptoms such as burning. In addition, please monitor for symptoms of increased swelling or low blood pressure such as lightheadedness, dizziness, weakness. Also monitor for left arm increased weakness, coldness or numbness.     You have had delirium while here in the hospital. That means confused thinking that comes and goes.. It may continue for a time after you leave the hospital. Here are suggestions for how to improve this:  - Make sure to stay awake as much as possible during the day  -  During the day, keep lights on and curtains open.   - During the night, keep lights out and TV off, except for a small night-light if needed for safety.   - Delirium sometimes feels scary. It helps to have someone explain over and over again what's going on, what day it is, and where you are.   - Being able to see and hear clearly can help. So if you have glasses or hearing aids, they should be kept on during waking hours.   It is important that someone stay with you in your home until the delirium is better. If delirium is lingering beyond a couple of days or getting worse anytime, contact the doctors (see below for contact information).    Who will I follow up with?  We have scheduled you follow up with these doctors  Follow up with [name] [name] on December 10 at 8:40 AM. You are also scheduled to follow up with Oncology in February 2020, as listed below.    Future Appointments  Date Time Provider Department Center  02/06/2018  8:40 AM [name] Lodge, MD [facility]  04/13/2018  8:30 AM HBR CT RM 1 H[facility] [place] - [facility]  04/17/2018  7:30 AM ADULT ONC LAB [facility]  04/17/2018  8:30 AM Molli Barrows, MD [facility]  04/17/2018  9:00 AM Sonny Masters, MD [facility]       When you see your primary doctor, please get these labs drawn:  - CBC, BMP, repeat urinalysis    What should I do if I start to feel sick again?  If the shortness of breath, weakness, swelling, or other symptoms worsen, or if you develop fevers, we would recommend that you call your primary [name] doctor.    In general, if you have specific questions about these instructions or your hospital stay in the next 48 hours, please call [phone] and ask for the [name] doctor on call. Let them know that you were just discharged from the hospital.     For new symptoms or problems, call your primary [name] provider.     If you believe that this is a life-threatening emergency, call 911 or go to the emergency room.       Thank you for allowing [place] to participate in your [name].               Appointments which have been scheduled for you    Feb 06, 2018  8:40 AM EST  (Arrive by 8:25 AM)  OFFICE VISIT with [name] Lodge, MD  [facility] REGION) [address] [place] [address]  [place] [address]  [phone]      Apr 13, 2018  8:30 AM EST  (Arrive by 8:15 AM)  CT CHEST WO CONTRAST with HBR CT RM 1  IMG CT HBR [facility]) [address]  [place] [address]  [phone]   Let us know if pt:  Pregnant or nursing  Claustrophobic    (Title:CTWOCNTRST)     Apr 17, 2018  7:30 AM EST  (Arrive by 7:00 AM)  LAB ONLY [place] with ADULT ONC LAB  [facility] ADULT ONCOLOGY LAB DRAW STATION [facility] REGION) [address]  [place] [address]  [phone]      Apr 17, 2018  8:30 AM EST  (Arrive by 8:00 AM)  RETURN LUNG ACTIVE [place] with Molli Barrows, MD  [place] [facility]ISCIP[facility] 2ND [facility] REGION) [address]  [place] [address]  [phone]      Apr 17, 2018  9:00 AM EST  (Arrive by 8:30 AM)  RETURN [name] [name] with Sonny Masters, MD  [facility] MULTIDISCIPLINARY 2ND F[facility] CANCER HOSP [facility] REGION) [address]  [place] [address]  [phone]             ???I attest that I have reviewed the student note and that the components of the history of the present illness, the physical exam, and the assessment and plan documented were performed by me or were performed in my presence by the student where I verified the documentation and performed (or re-performed) the exam and medical decision making.???    Blanche East, MD  Internal Medicine PGY-2

## 2018-02-02 NOTE — Unmapped (Deleted)
Assessment/Plan:         Problem List Items Addressed This Visit     None          I have reviewed and addressed the patient???s adherence and response to prescribed medications. I have identified patient barriers to following the proposed medication and treatment plan, and have noted opportunities to optimize healthy behaviors. I have answered the patient???s questions to satisfaction and the patient voices understanding.    No follow-ups on file.       Subjective:            Patient ID: [name] [name] is a 84 y.o. female who presents for follow up of the following:  Informant: Patient came to appointment alone.    No chief complaint on file.    HPI    Hypertension  BP at goal today. Patient is not currently taking Losartan and potassium supplement. She is taking Lasix 20 mg daily prn for swelling. On previous visit Amlodipine 5 mg was stopped without improvement.  BP Readings from Last 3 Encounters:   02/01/18 124/63   01/29/18 165/89   01/16/18 121/71     DM2  A1c at goal. Patient is taking Metformin XR 1000 mg BID and Novolog 70/30 8u BID. Medication compliance has been an issue in the past. At home BG readings between 400-500 before meals and 100-300 in the mornings when she first wakes up. She reports a decreased appetite. Gets routine meals but has to be forced to eat. Eats breakfast and dinner, usually skips lunch. Drinks water throughout the day.  Lab Results   Component Value Date    A1C 6.4 (H) 01/01/2018     Anemia  Patient was anemic upon hospitalization and is taking Ferrous sulfate 325 mg daily.    ***    Last OV:  Cellulitis  Patient was admitted for LLE cellulitis. She is currently taking Keflex 500 mg TID x 5 days with mild improvement. Patient's son reports a spreading of redness. She restarted Lasix 20 mg this morning to reduce swelling. Patient is elevating legs throughout the day.  ??  Urinary retention  Patient followed with neurology for voiding trial and assessment of urinary retention. She has started urinating on her own and using catheter every other day per son. Denies blood in urine.    L knee laceration  Seen by ED on 11/08/2017 for fall. Pts son states that pt was using a walker and fell forward. Pts son states that the sutures on left knee broke and he put butterfly bandages over the wound.??Sutures removed today. No s/sx of infection. Has been cleaning with mild soap and water and applying neosporin.  ??  Sacral decubitus ulcer - progression since last seen here. They have not been rotating her during the day.  ??  Chest pain  Pt reports chest pain from ED visit has resolved.   ??  Thoracic compression fracture  Pt reports having occassional back pain.She currently takes 1000mg  otc Tylenol and Salonpas for pain relief.   ??  Acute pulmonary embolism  Emboli in right upper lobe segmental and subsegmental arteries 04/30/17, taking apixaban BID.   ??  Metastatic lung cancer  Left upper lung 10cm mass found 04/30/17 with 2.7cm right upper lobe nodule. With mediastinal lymphadenopathy and hepatic lobe mass. Liver lesion biopsied 3/21 confirmed adenocarcinoma of lung.   - upcoming f/u for pleural effusion. Stop eliquis as recommend.  - No cardiopulm complaints - admission sxs of CP/DOE have resolved  ROS  Comprehensive ROS negative except as per HPI    Facility-Administered Medications Prior to Visit   Medication Dose Route Frequency Provider Last Rate Last Dose   ??? acetaminophen (TYLENOL) tablet 650 mg  650 mg Oral Q6H PRN Bennye Alm, MD   650 mg at 02/01/18 1135   ??? apixaban (ELIQUIS) tablet 5 mg  5 mg Oral BID Bennye Alm, MD   5 mg at 02/01/18 1143   ??? collagenase (SANTYL) ointment   Topical Daily Tiffany D Long, MD       ??? dextrose 50 % in water (D50W) 50 % solution 12.5 g  12.5 g Intravenous Q10 Min PRN Tiffany D Long, MD       ??? ferric gluconate (FERRLECIT) 125 mg in sodium chloride (NS) 0.9 % 100 mL IVPB  125 mg Intravenous BID Bennye Alm, MD 120 mL/hr at 02/01/18 0646 125 mg at 02/01/18 0646   ??? insulin lispro (HumaLOG) injection 0-12 Units  0-12 Units Subcutaneous ACHS Bennye Alm, MD   6 Units at 02/01/18 1432   ??? insulin NPH (HumuLIN,NovoLIN) injection 8 Units  8 Units Subcutaneous Q12H [facility] Kai Levins, MD       ??? levoFLOXacin (LEVAQUIN) tablet 500 mg  500 mg Oral Daily Tiffany D Long, MD   500 mg at 02/01/18 1135   ??? metroNIDAZOLE (FLAGYL) tablet 500 mg  500 mg Oral TID Bennye Alm, MD   500 mg at 02/01/18 1135   ??? micafungin (MYCAMINE) 100 mg in sodium chloride (NS) 0.9 % 100 mL IVPB  100 mg Intravenous Once Kai Levins, MD       ??? pneumococcal polysacchride (23-valps) (PNEUMOVAX) vaccine 0.5 mL  0.5 mL Subcutaneous During hospitalization Bennye Alm, MD       ??? potassium chloride 10 mEq in 100 mL IVPB  10 mEq Intravenous Q1H Berton Bon, MD 100 mL/hr at 02/01/18 1550 10 mEq at 02/01/18 1550     Outpatient Medications Prior to Visit   Medication Sig Dispense Refill   ??? ascorbic acid, vitamin C, (VITAMIN C) 500 MG tablet Take 1 tablet (500 mg total) by mouth daily. 30 tablet 11   ??? blood sugar diagnostic (GLUCOSE BLOOD) Strp Check blood sugar 4 times daily. 100 each 3   ??? blood sugar diagnostic Strp Check 4x/day as instructed 120 strip 5   ??? blood-glucose meter kit Use as instructed 1 each 0   ??? blood-glucose meter Misc 1 each by Miscellaneous route Four (4) times a day. for 1 day 1 each 1   ??? calcium carbonate-vitamin D3 (CALTRATE 600 + D) 600 mg (1,500 mg)-800 unit Chew Chew 1 tablet Two (2) times a day.     ??? [START ON 02/02/2018] collagenase (SANTYL) 250 unit/gram ointment Apply topically daily. To sacral wound 15 g 0   ??? crizotinib (XALKORI) 250 mg capsule Take 1 capsule (250 mg total) by mouth Two (2) times a day. 60 capsule 3   ??? ELIQUIS 5 mg Tab Take 5 mg by mouth Two (2) times a day. .  3   ??? ferrous sulfate 325 (65 FE) MG EC tablet Take 1 tablet (325 mg total) by mouth Two (2) times a day. 60 tablet 0   ??? furosemide (LASIX) 20 MG tablet Take 1 tablet (20 mg total) by mouth daily. 30 tablet 0   ??? insulin aspart protamine-insulin aspart (NOVOLOG MIX 70-30FLEXPEN U-100) 100 unit/mL (70-30) injection pen Inject 0.08 mL (8 Units  total) under the skin two (2) times a day. 15 mL 0   ??? lancets (ACCU-CHEK SOFTCLIX LANCETS) Misc Use 4 times daily. 100 each 3   ??? lancets Misc 1 each by Miscellaneous route Four (4) times a day. 200 each 4   ??? levoFLOXacin (LEVAQUIN) 500 MG tablet Take 1 tablet (500 mg total) by mouth daily. for 1 day 1 tablet 0   ??? lidocaine 4 % patch Place 1 patch on the skin daily as needed.      ??? metFORMIN (GLUCOPHAGE-XR) 500 MG 24 hr tablet TAKE 2 TABLETS (1,000 MG TOTAL) BY MOUTH TWO (2) TIMES A DAY. 360 tablet 3   ??? metroNIDAZOLE (FLAGYL) 500 MG tablet Take 1 tablet (500 mg total) by mouth Three (3) times a day. for 3 days 9 tablet 0   ??? mirtazapine (REMERON) 7.5 MG tablet Take 1 tablet (7.5 mg total) by mouth nightly. 30 tablet 3   ??? pen needle, diabetic (BD ULTRA-FINE SHORT PEN NEEDLE) 31 gauge x 5/16 Ndle USE AS DIRECTED. USE 4 NEEDLES A DAY WITH DIABETIC MEDICATION PENS AS DIRECTED     ??? potassium chloride SA (K-DUR,KLOR-CON) 20 MEQ tablet Take 10 mEq by mouth Two (2) times a day. 1/2 tablet = 10 mEq     ??? ranitidine (ZANTAC) 150 MG tablet Take 150 mg by mouth Two (2) times a day.      ??? sodium hypochlorite (DAKIN'S, QUARTER STRENGTH,) 0.125 % Soln Apply topically Two (2) times a day. 473 mL 0       The following portions of the patient's history were reviewed and updated as appropriate: allergies, current medications, past medical history, past social history and problem list.          Objective:            Vital Signs  There were no vitals taken for this visit.     Exam  General appearance: alert, cooperative, NAD   Head: normocephalic and atraumatic  Eyes: normal EOMs, PERRL, normal conjunctiva, no discharge  ENT: OP clear and moist, no tonsillar exudates or erythema, normal TMs and external ear canals, normal nasal passages  Neck: normal ROM, supple, no thyromegaly, no cervical/supraclavicular lymphadenopathy, no appreciable JVD  Lungs: normal work of breathing, good air entry and clear to auscultation bilaterally, no wheezes or crackles appreciated   Heart: regular rate and rhythm, S1, S2 normal, no murmur, click, rub or gallop   Abdomen: Abdomen soft, non-tender, non distended. No masses, no organomegaly. Bowel sounds present.  Back: no spinal or paraspinal tenderness, no CVA tenderness  Extremities: extremities normal, atraumatic, no cyanosis or edema. Pulses 2+  MSK: no joint tenderness or swelling  NEURO: AAOx3, appropriately responds to questions, spontaneous movement of extremities, strength and sensation intact bilaterally, no facial asymmetry or CN deficit  Psych: normal mood and affect  Skin: Warm and dry. No rashes or lesions.       I attest that I, Pollyann Savoy, personally documented this note while acting as scribe for [name], MD.      Pollyann Savoy, Scribe.  02/06/2018     The documentation recorded by the scribe accurately reflects the service I personally performed and the decisions made by me.    [name], MD

## 2018-02-05 DIAGNOSIS — M5135 Other intervertebral disc degeneration, thoracolumbar region: Secondary | ICD-10-CM

## 2018-02-05 DIAGNOSIS — C7951 Secondary malignant neoplasm of bone: Secondary | ICD-10-CM

## 2018-02-05 DIAGNOSIS — Z9181 History of falling: Secondary | ICD-10-CM

## 2018-02-05 DIAGNOSIS — D510 Vitamin B12 deficiency anemia due to intrinsic factor deficiency: Secondary | ICD-10-CM

## 2018-02-05 DIAGNOSIS — E1165 Type 2 diabetes mellitus with hyperglycemia: Secondary | ICD-10-CM

## 2018-02-05 DIAGNOSIS — Z4801 Encounter for change or removal of surgical wound dressing: Secondary | ICD-10-CM

## 2018-02-05 DIAGNOSIS — I4891 Unspecified atrial fibrillation: Secondary | ICD-10-CM

## 2018-02-05 DIAGNOSIS — C7931 Secondary malignant neoplasm of brain: Secondary | ICD-10-CM

## 2018-02-05 DIAGNOSIS — Z466 Encounter for fitting and adjustment of urinary device: Secondary | ICD-10-CM

## 2018-02-05 DIAGNOSIS — J910 Malignant pleural effusion: Secondary | ICD-10-CM

## 2018-02-05 DIAGNOSIS — C787 Secondary malignant neoplasm of liver and intrahepatic bile duct: Secondary | ICD-10-CM

## 2018-02-05 DIAGNOSIS — L89150 Pressure ulcer of sacral region, unstageable: Secondary | ICD-10-CM

## 2018-02-05 DIAGNOSIS — Z87440 Personal history of urinary (tract) infections: Secondary | ICD-10-CM

## 2018-02-05 DIAGNOSIS — R339 Retention of urine, unspecified: Secondary | ICD-10-CM

## 2018-02-05 DIAGNOSIS — C3412 Malignant neoplasm of upper lobe, left bronchus or lung: Principal | ICD-10-CM

## 2018-02-05 DIAGNOSIS — Z794 Long term (current) use of insulin: Secondary | ICD-10-CM

## 2018-02-05 DIAGNOSIS — Z7901 Long term (current) use of anticoagulants: Secondary | ICD-10-CM

## 2018-02-05 DIAGNOSIS — I10 Essential (primary) hypertension: Secondary | ICD-10-CM

## 2018-02-05 DIAGNOSIS — Z86711 Personal history of pulmonary embolism: Secondary | ICD-10-CM

## 2018-02-05 DIAGNOSIS — I959 Hypotension, unspecified: Secondary | ICD-10-CM

## 2018-02-05 DIAGNOSIS — L97322 Non-pressure chronic ulcer of left ankle with fat layer exposed: Secondary | ICD-10-CM

## 2018-02-06 DIAGNOSIS — Z86711 Personal history of pulmonary embolism: Secondary | ICD-10-CM

## 2018-02-06 DIAGNOSIS — C7931 Secondary malignant neoplasm of brain: Secondary | ICD-10-CM

## 2018-02-06 DIAGNOSIS — Z87440 Personal history of urinary (tract) infections: Secondary | ICD-10-CM

## 2018-02-06 DIAGNOSIS — Z794 Long term (current) use of insulin: Secondary | ICD-10-CM

## 2018-02-06 DIAGNOSIS — M5135 Other intervertebral disc degeneration, thoracolumbar region: Secondary | ICD-10-CM

## 2018-02-06 DIAGNOSIS — E1165 Type 2 diabetes mellitus with hyperglycemia: Secondary | ICD-10-CM

## 2018-02-06 DIAGNOSIS — Z4801 Encounter for change or removal of surgical wound dressing: Secondary | ICD-10-CM

## 2018-02-06 DIAGNOSIS — C3412 Malignant neoplasm of upper lobe, left bronchus or lung: Principal | ICD-10-CM

## 2018-02-06 DIAGNOSIS — C787 Secondary malignant neoplasm of liver and intrahepatic bile duct: Secondary | ICD-10-CM

## 2018-02-06 DIAGNOSIS — R339 Retention of urine, unspecified: Secondary | ICD-10-CM

## 2018-02-06 DIAGNOSIS — Z7901 Long term (current) use of anticoagulants: Secondary | ICD-10-CM

## 2018-02-06 DIAGNOSIS — Z9181 History of falling: Secondary | ICD-10-CM

## 2018-02-06 DIAGNOSIS — Z466 Encounter for fitting and adjustment of urinary device: Secondary | ICD-10-CM

## 2018-02-06 DIAGNOSIS — I4891 Unspecified atrial fibrillation: Secondary | ICD-10-CM

## 2018-02-06 DIAGNOSIS — D510 Vitamin B12 deficiency anemia due to intrinsic factor deficiency: Secondary | ICD-10-CM

## 2018-02-06 DIAGNOSIS — J910 Malignant pleural effusion: Secondary | ICD-10-CM

## 2018-02-06 DIAGNOSIS — I959 Hypotension, unspecified: Secondary | ICD-10-CM

## 2018-02-06 DIAGNOSIS — I10 Essential (primary) hypertension: Secondary | ICD-10-CM

## 2018-02-06 DIAGNOSIS — L89150 Pressure ulcer of sacral region, unstageable: Secondary | ICD-10-CM

## 2018-02-06 DIAGNOSIS — L97322 Non-pressure chronic ulcer of left ankle with fat layer exposed: Secondary | ICD-10-CM

## 2018-02-06 DIAGNOSIS — C7951 Secondary malignant neoplasm of bone: Secondary | ICD-10-CM

## 2018-02-07 MED ORDER — NYSTATIN 100,000 UNIT/ML ORAL SUSPENSION
Freq: Four times a day (QID) | ORAL | 0 refills | 0.00000 days | Status: CP
Start: 2018-02-07 — End: ?

## 2018-02-07 NOTE — Unmapped (Signed)
Xalkori 250mg  was given last night and the son believes it sat on her tongue all night. Her tongue is swollen and bleeding this morning and her breathing sounds mucosy. Tried to get patient to smile but she fell back asleep. Arms can move equally on both sides, but she is very weak as baseline. Patient was speaking to son this morning but she was hard to understand, not sure if it is because of swollen tongue or confusion.    Background: Came home from the hospital on 12/05 and was back to almost normal. Monday and Tuesday of this week she started acting a little more tired/lethargic and is still sleepy at this time 9:20am when she usually gets up.     Plan: Encouraged patient's son to call an ambulance if her breathing became more difficult. Will reach out to Tammy NN about reaction to [facility] and call patient back ASAP.

## 2018-02-07 NOTE — Unmapped (Signed)
Clinical Pharmacist Practitioner Lung Cancer Clinic: PHONE CONSULT    [name] [name] is a 84 y.o. 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     Current cancer therapy: Crizotinib 250mg  PO BID (started 06/23/2017)    HPI:[name] [name] was hospitalized from 11/24 through 12/5, originally at [facility] but then transferred to [facility]. She had K pneumoniae and B fragillis bacteremia, and also grew Pseudomonas and Candida glabrata on urine culture (glabrata suspected to be a contaminant from foley). Her son calls today to report that she did not swallow her crizotinib capsule and held it in her mouth overnight. When he got there this morning she still had remnants of the capsule in her mouth, along with mucus. He said her tongue was swollen and she had welts in her mouth.     PLAN:   1. We discussed moving her evening doses of BID medications to closer to dinner time (5-6pm) so that she is not so drowsy and can swallow them. Also discussed checking her mouth after giving medication to ensure she has swallowed it.   2. Sent in Rx for Nystatin for presumed thrush. No signs of systemic infection, but I remain concerned about her urine cultures that were positive for C glabrata. She is following up with her PCP and has home health support.     ASSESSMENT:   [name] [name] says his mom is improved from this morning. He says when he got there at 5:30am they found remnants of the crizotinib capsule in her mouth. They took out her dentures and cleaned her mouth out, but he says she had a lot of mucus, her tongue was swollen such that it was difficult to understand her speech, and she had welts in her mouth. He states that this has improved this morning after she was able to eat breakfast. The tongue swelling is decreased and she is talking normally. He did say that she had some coughing when she drank fluids, but he usually uses a straw and did not this time. He says he still hears some mucus in her lungs, but she has not shortness of breath and appears comfortable. He states that he thinks she has thrush, which she has had before, since finishing her antibiotics. Denies fevers, altered mental status, chills, changes in urinary habits.     I asked if the patient sleeps lying flat, as I am concerned given this situation for aspiration. He says she does sleep slightly propped up, but she improved a lot when he sat her up in a chair this morning. Discussed ensuring that she is propped up at night and sitting up when possible.     He says that she has no issues swallowing her medications normally, but that she gets very tired at night and this likely contributed to her not swallowing the medication. We discussed that there should be a minimum of 8 hours between crizotinib doses, and that we could move up her evening dose to around dinner time to ensure she is alert and able to swallow.     FOLLOW-UP: Seeing her PCP 12/18. Does not have oncology follow up scheduled until February but I will reach out to Dr. Janann August and Dr. Legrand Como to suggest an earlier follow up given her recent admission and overall decline.     A total of 25 minutes was spent in direct patient care.     Leotis Shames, PharmD,  BCOP, CPP

## 2018-02-07 NOTE — Unmapped (Signed)
Hi,     Patient's son contacted the Communication Center requesting to speak with the care team of [name] [name] to discuss:    Requesting a call back from Tammy Allred.    Please contact patient's son at [phone].    Program: Lung  Speciality: Medical Oncology    Check Indicates criteria has been reviewed and confirmed with the patient:    [x]  Preferred Name   [x]  DOB and/or MR#  [x]  Preferred Contact Method  [x]  Phone Number(s)   []  MyChart     Thank you,   Jannette Spanner  [facility]   [phone]

## 2018-02-08 DIAGNOSIS — Z86711 Personal history of pulmonary embolism: Secondary | ICD-10-CM

## 2018-02-08 DIAGNOSIS — C7951 Secondary malignant neoplasm of bone: Secondary | ICD-10-CM

## 2018-02-08 DIAGNOSIS — E1165 Type 2 diabetes mellitus with hyperglycemia: Secondary | ICD-10-CM

## 2018-02-08 DIAGNOSIS — I959 Hypotension, unspecified: Secondary | ICD-10-CM

## 2018-02-08 DIAGNOSIS — L97322 Non-pressure chronic ulcer of left ankle with fat layer exposed: Secondary | ICD-10-CM

## 2018-02-08 DIAGNOSIS — Z794 Long term (current) use of insulin: Secondary | ICD-10-CM

## 2018-02-08 DIAGNOSIS — I10 Essential (primary) hypertension: Secondary | ICD-10-CM

## 2018-02-08 DIAGNOSIS — R339 Retention of urine, unspecified: Secondary | ICD-10-CM

## 2018-02-08 DIAGNOSIS — Z9181 History of falling: Secondary | ICD-10-CM

## 2018-02-08 DIAGNOSIS — Z87440 Personal history of urinary (tract) infections: Secondary | ICD-10-CM

## 2018-02-08 DIAGNOSIS — C3412 Malignant neoplasm of upper lobe, left bronchus or lung: Principal | ICD-10-CM

## 2018-02-08 DIAGNOSIS — C787 Secondary malignant neoplasm of liver and intrahepatic bile duct: Secondary | ICD-10-CM

## 2018-02-08 DIAGNOSIS — Z4801 Encounter for change or removal of surgical wound dressing: Secondary | ICD-10-CM

## 2018-02-08 DIAGNOSIS — Z466 Encounter for fitting and adjustment of urinary device: Secondary | ICD-10-CM

## 2018-02-08 DIAGNOSIS — M5135 Other intervertebral disc degeneration, thoracolumbar region: Secondary | ICD-10-CM

## 2018-02-08 DIAGNOSIS — C7931 Secondary malignant neoplasm of brain: Secondary | ICD-10-CM

## 2018-02-08 DIAGNOSIS — D510 Vitamin B12 deficiency anemia due to intrinsic factor deficiency: Secondary | ICD-10-CM

## 2018-02-08 DIAGNOSIS — I4891 Unspecified atrial fibrillation: Secondary | ICD-10-CM

## 2018-02-08 DIAGNOSIS — L89150 Pressure ulcer of sacral region, unstageable: Secondary | ICD-10-CM

## 2018-02-08 DIAGNOSIS — Z7901 Long term (current) use of anticoagulants: Secondary | ICD-10-CM

## 2018-02-08 DIAGNOSIS — J910 Malignant pleural effusion: Secondary | ICD-10-CM

## 2018-02-08 NOTE — Unmapped (Deleted)
Assessment/Plan:         Problem List Items Addressed This Visit     None          I have reviewed and addressed the patient???s adherence and response to prescribed medications. I have identified patient barriers to following the proposed medication and treatment plan, and have noted opportunities to optimize healthy behaviors. I have answered the patient???s questions to satisfaction and the patient voices understanding.    No follow-ups on file.       Subjective:            Patient ID: [name] [name] is a 84 y.o. female who presents for follow up of the following:  Informant: Patient came to appointment alone.    No chief complaint on file.    HPI  ??  DM2  A1c at goal. Patient is taking Novolog 70/30 8u BID and Metformin XR 1000 mg BID. Medication compliance has been an issue in the past. At home BG readings between 400-500 before meals and 100-300 in the mornings when she first wakes up. She reports a decreased appetite. Gets routine meals but has to be forced to eat. Eats breakfast and dinner, usually skips lunch. Drinks water throughout the day. She was on SSI (4-6u with meals) throughout hospitalization.  Lab Results   Component Value Date    A1C 6.4 (H) 01/01/2018    A1C 8.1 (A) 09/26/2017    A1C 9.6 (A) 08/22/2017    A1C 10.3 (A) 08/01/2017    A1C 8.1 (H) 05/19/2017       ***    Last OV:  HTN  BP at goal today. Patient is not currently taking Losartan and potassium supplement. She started Lasix 20 mg daily this morning to help reduce swelling. On previous visit Amlodipine 5 mg was stopped without improvement.      BP Readings from Last 3 Encounters:   01/23/18 130/76   01/16/18 121/71   01/08/18 112/76   ??  Cellulitis  Patient was admitted for LLE cellulitis. She is currently taking Keflex 500 mg TID x 5 days with mild improvement. Patient's son reports a spreading of redness. She restarted Lasix 20 mg this morning to reduce swelling. Patient is elevating legs throughout the day.  ??  Urinary retention Patient followed with neurology for voiding trial and assessment of urinary retention. She has started urinating on her own and using catheter every other day per son. Denies blood in urine.  ??  Anemia  Patient was anemic upon hospitalization and has not yet started iron supplements.    Denies difficulty breathing, chest pains, and coughing.  ??  L knee laceration  Seen by ED on 11/08/2017 for fall. Pts son states that pt was using a walker and fell forward. Pts son states that the sutures on left knee broke and he put butterfly bandages over the wound.??Sutures removed today. No s/sx of infection. Has been cleaning with mild soap and water and applying neosporin.  ??  Sacral decubitus ulcer - progression since last seen here. They have not been rotating her during the day.  ??  Chest pain  Pt reports chest pain from ED visit has resolved.   ??  Thoracic compression fracture  Pt reports having occassional back pain.She currently takes 1000mg  otc Tylenol and Salonpas for pain relief.   ??  Acute pulmonary embolism  Emboli in right upper lobe segmental and subsegmental arteries 04/30/17, taking apixaban BID.   ??  Metastatic lung cancer  Left upper lung 10cm mass found 04/30/17 with 2.7cm right upper lobe nodule. With mediastinal lymphadenopathy and hepatic lobe mass. Liver lesion biopsied 3/21 confirmed adenocarcinoma of lung.   - upcoming f/u for pleural effusion. Stop eliquis as recommend.  - No cardiopulm complaints - admission sxs of CP/DOE have resolved    ROS  Comprehensive ROS negative except as per HPI    Outpatient Medications Prior to Visit   Medication Sig Dispense Refill   ??? ascorbic acid, vitamin C, (VITAMIN C) 500 MG tablet Take 1 tablet (500 mg total) by mouth daily. 30 tablet 11   ??? blood sugar diagnostic (GLUCOSE BLOOD) Strp Check blood sugar 4 times daily. 100 each 3   ??? blood sugar diagnostic Strp Check 4x/day as instructed 120 strip 5   ??? blood-glucose meter kit Use as instructed 1 each 0   ??? blood-glucose meter Misc 1 each by Miscellaneous route Four (4) times a day. for 1 day 1 each 1   ??? calcium carbonate-vitamin D3 (CALTRATE 600 + D) 600 mg (1,500 mg)-800 unit Chew Chew 1 tablet Two (2) times a day.     ??? collagenase (SANTYL) 250 unit/gram ointment Apply topically daily. To sacral wound 15 g 0   ??? crizotinib (XALKORI) 250 mg capsule Take 1 capsule (250 mg total) by mouth Two (2) times a day. 60 capsule 3   ??? ELIQUIS 5 mg Tab Take 5 mg by mouth Two (2) times a day. .  3   ??? ferrous sulfate 325 (65 FE) MG EC tablet Take 1 tablet (325 mg total) by mouth Two (2) times a day. 60 tablet 0   ??? furosemide (LASIX) 20 MG tablet Take 1 tablet (20 mg total) by mouth daily. 30 tablet 0   ??? insulin aspart protamine-insulin aspart (NOVOLOG MIX 70-30FLEXPEN U-100) 100 unit/mL (70-30) injection pen Inject 0.08 mL (8 Units total) under the skin two (2) times a day. 15 mL 0   ??? lancets (ACCU-CHEK SOFTCLIX LANCETS) Misc Use 4 times daily. 100 each 3   ??? lancets Misc 1 each by Miscellaneous route Four (4) times a day. 200 each 4   ??? lidocaine 4 % patch Place 1 patch on the skin daily as needed (pain).     ??? metFORMIN (GLUCOPHAGE-XR) 500 MG 24 hr tablet TAKE 2 TABLETS (1,000 MG TOTAL) BY MOUTH TWO (2) TIMES A DAY. 360 tablet 3   ??? mirtazapine (REMERON) 7.5 MG tablet Take 1 tablet (7.5 mg total) by mouth nightly. 30 tablet 3   ??? nystatin (MYCOSTATIN) 100,000 unit/mL suspension Take 5 mL (500,000 Units total) by mouth Four (4) times a day. 280 mL 0   ??? pen needle, diabetic (BD ULTRA-FINE SHORT PEN NEEDLE) 31 gauge x 5/16 Ndle USE AS DIRECTED. USE 4 NEEDLES A DAY WITH DIABETIC MEDICATION PENS AS DIRECTED     ??? potassium chloride SA (K-DUR,KLOR-CON) 20 MEQ tablet Take 10 mEq by mouth Two (2) times a day. 1/2 tablet = 10 mEq     ??? ranitidine (ZANTAC) 150 MG tablet Take 150 mg by mouth Two (2) times a day.      ??? sodium hypochlorite (DAKIN'S, QUARTER STRENGTH,) 0.125 % Soln Apply topically Two (2) times a day. 473 mL 0     No facility-administered medications prior to visit.        The following portions of the patient's history were reviewed and updated as appropriate: allergies, current medications, past medical history, past social history and problem list.  Objective:            Vital Signs  There were no vitals taken for this visit.     Exam  General appearance: alert, cooperative, NAD   Head: normocephalic and atraumatic  Eyes: normal EOMs, PERRL, normal conjunctiva, no discharge  ENT: OP clear and moist, no tonsillar exudates or erythema, normal TMs and external ear canals, normal nasal passages  Neck: normal ROM, supple, no thyromegaly, no cervical/supraclavicular lymphadenopathy, no appreciable JVD  Lungs: normal work of breathing, good air entry and clear to auscultation bilaterally, no wheezes or crackles appreciated   Heart: regular rate and rhythm, S1, S2 normal, no murmur, click, rub or gallop   Abdomen: Abdomen soft, non-tender, non distended. No masses, no organomegaly. Bowel sounds present.  Back: no spinal or paraspinal tenderness, no CVA tenderness  Extremities: extremities normal, atraumatic, no cyanosis or edema. Pulses 2+  MSK: no joint tenderness or swelling  NEURO: AAOx3, appropriately responds to questions, spontaneous movement of extremities, strength and sensation intact bilaterally, no facial asymmetry or CN deficit  Psych: normal mood and affect  Skin: Warm and dry. No rashes or lesions.       I attest that I, Pollyann Savoy, personally documented this note while acting as scribe for [name], MD.      Pollyann Savoy, Scribe.  02/14/2018     The documentation recorded by the scribe accurately reflects the service I personally performed and the decisions made by me.    [name], MD

## 2018-02-09 NOTE — Unmapped (Signed)
Son has agreed to bring patient on 12/17 if patient has enough strength to come to appt at that time.

## 2018-02-12 DIAGNOSIS — Z794 Long term (current) use of insulin: Secondary | ICD-10-CM

## 2018-02-12 DIAGNOSIS — I10 Essential (primary) hypertension: Secondary | ICD-10-CM

## 2018-02-12 DIAGNOSIS — R339 Retention of urine, unspecified: Secondary | ICD-10-CM

## 2018-02-12 DIAGNOSIS — C3412 Malignant neoplasm of upper lobe, left bronchus or lung: Principal | ICD-10-CM

## 2018-02-12 DIAGNOSIS — Z87440 Personal history of urinary (tract) infections: Secondary | ICD-10-CM

## 2018-02-12 DIAGNOSIS — Z86711 Personal history of pulmonary embolism: Secondary | ICD-10-CM

## 2018-02-12 DIAGNOSIS — L89150 Pressure ulcer of sacral region, unstageable: Secondary | ICD-10-CM

## 2018-02-12 DIAGNOSIS — I959 Hypotension, unspecified: Secondary | ICD-10-CM

## 2018-02-12 DIAGNOSIS — D510 Vitamin B12 deficiency anemia due to intrinsic factor deficiency: Secondary | ICD-10-CM

## 2018-02-12 DIAGNOSIS — Z466 Encounter for fitting and adjustment of urinary device: Secondary | ICD-10-CM

## 2018-02-12 DIAGNOSIS — Z7901 Long term (current) use of anticoagulants: Secondary | ICD-10-CM

## 2018-02-12 DIAGNOSIS — E1165 Type 2 diabetes mellitus with hyperglycemia: Secondary | ICD-10-CM

## 2018-02-12 DIAGNOSIS — C7951 Secondary malignant neoplasm of bone: Secondary | ICD-10-CM

## 2018-02-12 DIAGNOSIS — L97322 Non-pressure chronic ulcer of left ankle with fat layer exposed: Secondary | ICD-10-CM

## 2018-02-12 DIAGNOSIS — J910 Malignant pleural effusion: Secondary | ICD-10-CM

## 2018-02-12 DIAGNOSIS — C7931 Secondary malignant neoplasm of brain: Secondary | ICD-10-CM

## 2018-02-12 DIAGNOSIS — C787 Secondary malignant neoplasm of liver and intrahepatic bile duct: Secondary | ICD-10-CM

## 2018-02-12 DIAGNOSIS — Z4801 Encounter for change or removal of surgical wound dressing: Secondary | ICD-10-CM

## 2018-02-12 DIAGNOSIS — I4891 Unspecified atrial fibrillation: Secondary | ICD-10-CM

## 2018-02-12 DIAGNOSIS — Z9181 History of falling: Secondary | ICD-10-CM

## 2018-02-12 DIAGNOSIS — M5135 Other intervertebral disc degeneration, thoracolumbar region: Secondary | ICD-10-CM

## 2018-02-13 DIAGNOSIS — C7931 Secondary malignant neoplasm of brain: Secondary | ICD-10-CM

## 2018-02-13 DIAGNOSIS — I959 Hypotension, unspecified: Secondary | ICD-10-CM

## 2018-02-13 DIAGNOSIS — Z87440 Personal history of urinary (tract) infections: Secondary | ICD-10-CM

## 2018-02-13 DIAGNOSIS — Z4801 Encounter for change or removal of surgical wound dressing: Secondary | ICD-10-CM

## 2018-02-13 DIAGNOSIS — M5135 Other intervertebral disc degeneration, thoracolumbar region: Secondary | ICD-10-CM

## 2018-02-13 DIAGNOSIS — R339 Retention of urine, unspecified: Secondary | ICD-10-CM

## 2018-02-13 DIAGNOSIS — I10 Essential (primary) hypertension: Secondary | ICD-10-CM

## 2018-02-13 DIAGNOSIS — C787 Secondary malignant neoplasm of liver and intrahepatic bile duct: Secondary | ICD-10-CM

## 2018-02-13 DIAGNOSIS — Z7901 Long term (current) use of anticoagulants: Secondary | ICD-10-CM

## 2018-02-13 DIAGNOSIS — J910 Malignant pleural effusion: Secondary | ICD-10-CM

## 2018-02-13 DIAGNOSIS — L89150 Pressure ulcer of sacral region, unstageable: Secondary | ICD-10-CM

## 2018-02-13 DIAGNOSIS — C7951 Secondary malignant neoplasm of bone: Secondary | ICD-10-CM

## 2018-02-13 DIAGNOSIS — E1165 Type 2 diabetes mellitus with hyperglycemia: Secondary | ICD-10-CM

## 2018-02-13 DIAGNOSIS — Z86711 Personal history of pulmonary embolism: Secondary | ICD-10-CM

## 2018-02-13 DIAGNOSIS — Z466 Encounter for fitting and adjustment of urinary device: Secondary | ICD-10-CM

## 2018-02-13 DIAGNOSIS — Z9181 History of falling: Secondary | ICD-10-CM

## 2018-02-13 DIAGNOSIS — D510 Vitamin B12 deficiency anemia due to intrinsic factor deficiency: Secondary | ICD-10-CM

## 2018-02-13 DIAGNOSIS — L97322 Non-pressure chronic ulcer of left ankle with fat layer exposed: Secondary | ICD-10-CM

## 2018-02-13 DIAGNOSIS — I4891 Unspecified atrial fibrillation: Secondary | ICD-10-CM

## 2018-02-13 DIAGNOSIS — Z794 Long term (current) use of insulin: Secondary | ICD-10-CM

## 2018-02-13 DIAGNOSIS — C3412 Malignant neoplasm of upper lobe, left bronchus or lung: Principal | ICD-10-CM

## 2018-02-13 NOTE — Unmapped (Signed)
Hi,     [name] [name] contacted the [employer] requesting to speak with the care team of [name] [name] to discuss:     Patient just released from hospital today. Too weak to come to appointment tomorrow. Need to discuss plan of care.    Please contact [name] [name] at [phone].    Program: Lung  Speciality: Medical Oncology    Check Indicates criteria has been reviewed and confirmed with the patient:    [x]  Preferred Name   [x]  DOB and/or MR#  [x]  Preferred Contact Method  [x]  Phone Number(s)   []  MyChart     Thank you,   Kelli Hope  [facility]   [phone]

## 2018-02-13 NOTE — Unmapped (Signed)
Hi Dr. Janann August,    Patient [name] [name] contacted the Communication [facility] to cancel their appointment for tomorrow.  The appointment has been cancelled.    Cancellation Reason: Sick    Patient's son [name] rescheduled for 03/13/18.    Thank you,  Kelli Hope  [facility]   [phone]

## 2018-02-14 NOTE — Unmapped (Signed)
Received fax from pcp for [facility] orders. Faxed to [phone]. Confirmation received.

## 2018-02-15 DIAGNOSIS — I4891 Unspecified atrial fibrillation: Secondary | ICD-10-CM

## 2018-02-15 DIAGNOSIS — Z9181 History of falling: Secondary | ICD-10-CM

## 2018-02-15 DIAGNOSIS — Z87440 Personal history of urinary (tract) infections: Secondary | ICD-10-CM

## 2018-02-15 DIAGNOSIS — Z86711 Personal history of pulmonary embolism: Secondary | ICD-10-CM

## 2018-02-15 DIAGNOSIS — L97322 Non-pressure chronic ulcer of left ankle with fat layer exposed: Secondary | ICD-10-CM

## 2018-02-15 DIAGNOSIS — Z4801 Encounter for change or removal of surgical wound dressing: Secondary | ICD-10-CM

## 2018-02-15 DIAGNOSIS — C3412 Malignant neoplasm of upper lobe, left bronchus or lung: Principal | ICD-10-CM

## 2018-02-15 DIAGNOSIS — J910 Malignant pleural effusion: Secondary | ICD-10-CM

## 2018-02-15 DIAGNOSIS — M5135 Other intervertebral disc degeneration, thoracolumbar region: Secondary | ICD-10-CM

## 2018-02-15 DIAGNOSIS — E1165 Type 2 diabetes mellitus with hyperglycemia: Secondary | ICD-10-CM

## 2018-02-15 DIAGNOSIS — I10 Essential (primary) hypertension: Secondary | ICD-10-CM

## 2018-02-15 DIAGNOSIS — R339 Retention of urine, unspecified: Secondary | ICD-10-CM

## 2018-02-15 DIAGNOSIS — L89150 Pressure ulcer of sacral region, unstageable: Secondary | ICD-10-CM

## 2018-02-15 DIAGNOSIS — Z466 Encounter for fitting and adjustment of urinary device: Secondary | ICD-10-CM

## 2018-02-15 DIAGNOSIS — C7951 Secondary malignant neoplasm of bone: Secondary | ICD-10-CM

## 2018-02-15 DIAGNOSIS — Z7901 Long term (current) use of anticoagulants: Secondary | ICD-10-CM

## 2018-02-15 DIAGNOSIS — D510 Vitamin B12 deficiency anemia due to intrinsic factor deficiency: Secondary | ICD-10-CM

## 2018-02-15 DIAGNOSIS — I959 Hypotension, unspecified: Secondary | ICD-10-CM

## 2018-02-15 DIAGNOSIS — Z794 Long term (current) use of insulin: Secondary | ICD-10-CM

## 2018-02-15 DIAGNOSIS — C7931 Secondary malignant neoplasm of brain: Secondary | ICD-10-CM

## 2018-02-15 DIAGNOSIS — C787 Secondary malignant neoplasm of liver and intrahepatic bile duct: Secondary | ICD-10-CM

## 2018-02-17 NOTE — Unmapped (Signed)
Spoke with patient's son [name] [name].    He reports that she was hospitalized recently with UTI and bloodstream infection.    Today he reports that she is a lot better and is overall recovering.  He notes that she is not walking on her own but with assistance will walk and be able to sit on the toilet.  She has started to talk and engage more with those around her.  She is eating better and [name] [name] perceives her as overall feeling better.    [name] [name] sent an epic message to her pulmonologist Dr. Salli Real regarding the appearance of her skin around her Pleurx catheter site.  I advised him to take a picture and include in a follow-up message.    Patient has follow-up scheduled with Dr. Janann August in oncology next month.    Doreatha Martin, MD  02/16/2018  4:28 PM

## 2018-02-19 MED ORDER — MEDICAL SUPPLY ITEM
0 refills | 0 days
Start: 2018-02-19 — End: ?

## 2018-02-19 MED ORDER — KLOR-CON M20 MEQ TABLET,EXTENDED RELEASE
ORAL_TABLET | 1 refills | 0 days | Status: CP
Start: 2018-02-19 — End: ?

## 2018-02-19 NOTE — Unmapped (Signed)
Called Mr. Searson back in response to his My Chart message about his mother's skin condition under the bandage.    He says that there looks like some skin breakdown and that the thought they could see the catheter through the skin.  After a few days of neosporin over the site, it appears to be healing up.  There is no redness, welling, pain purulent drainage, fevers, or chills associated with this.  He skin is also breaking down where the clear bandage attaches.    I recommended continuing with the neosporin, to send me a picture of the wound via mychart, and to start painting the area of break down with milk of magnesia, allowing it to dry, and placing the clear bandage on the dried milk of magnesia.      She is draining 100-362mL from the left every other day, and 500-757mL from the right every other day.    He agreed and all questions were answered to his satisfaction.

## 2018-02-20 DIAGNOSIS — Z86711 Personal history of pulmonary embolism: Secondary | ICD-10-CM

## 2018-02-20 DIAGNOSIS — Z794 Long term (current) use of insulin: Secondary | ICD-10-CM

## 2018-02-20 DIAGNOSIS — L89150 Pressure ulcer of sacral region, unstageable: Secondary | ICD-10-CM

## 2018-02-20 DIAGNOSIS — C7951 Secondary malignant neoplasm of bone: Secondary | ICD-10-CM

## 2018-02-20 DIAGNOSIS — I959 Hypotension, unspecified: Secondary | ICD-10-CM

## 2018-02-20 DIAGNOSIS — I10 Essential (primary) hypertension: Secondary | ICD-10-CM

## 2018-02-20 DIAGNOSIS — J910 Malignant pleural effusion: Secondary | ICD-10-CM

## 2018-02-20 DIAGNOSIS — M5135 Other intervertebral disc degeneration, thoracolumbar region: Secondary | ICD-10-CM

## 2018-02-20 DIAGNOSIS — C7931 Secondary malignant neoplasm of brain: Secondary | ICD-10-CM

## 2018-02-20 DIAGNOSIS — R339 Retention of urine, unspecified: Secondary | ICD-10-CM

## 2018-02-20 DIAGNOSIS — E1165 Type 2 diabetes mellitus with hyperglycemia: Secondary | ICD-10-CM

## 2018-02-20 DIAGNOSIS — D510 Vitamin B12 deficiency anemia due to intrinsic factor deficiency: Secondary | ICD-10-CM

## 2018-02-20 DIAGNOSIS — Z87440 Personal history of urinary (tract) infections: Secondary | ICD-10-CM

## 2018-02-20 DIAGNOSIS — I4891 Unspecified atrial fibrillation: Secondary | ICD-10-CM

## 2018-02-20 DIAGNOSIS — L97322 Non-pressure chronic ulcer of left ankle with fat layer exposed: Secondary | ICD-10-CM

## 2018-02-20 DIAGNOSIS — Z9181 History of falling: Secondary | ICD-10-CM

## 2018-02-20 DIAGNOSIS — Z4801 Encounter for change or removal of surgical wound dressing: Secondary | ICD-10-CM

## 2018-02-20 DIAGNOSIS — Z7901 Long term (current) use of anticoagulants: Secondary | ICD-10-CM

## 2018-02-20 DIAGNOSIS — C787 Secondary malignant neoplasm of liver and intrahepatic bile duct: Secondary | ICD-10-CM

## 2018-02-20 DIAGNOSIS — C3412 Malignant neoplasm of upper lobe, left bronchus or lung: Principal | ICD-10-CM

## 2018-02-20 DIAGNOSIS — Z466 Encounter for fitting and adjustment of urinary device: Secondary | ICD-10-CM

## 2018-02-23 DIAGNOSIS — Z86711 Personal history of pulmonary embolism: Secondary | ICD-10-CM

## 2018-02-23 DIAGNOSIS — Z466 Encounter for fitting and adjustment of urinary device: Secondary | ICD-10-CM

## 2018-02-23 DIAGNOSIS — I10 Essential (primary) hypertension: Secondary | ICD-10-CM

## 2018-02-23 DIAGNOSIS — C787 Secondary malignant neoplasm of liver and intrahepatic bile duct: Secondary | ICD-10-CM

## 2018-02-23 DIAGNOSIS — I959 Hypotension, unspecified: Secondary | ICD-10-CM

## 2018-02-23 DIAGNOSIS — I4891 Unspecified atrial fibrillation: Secondary | ICD-10-CM

## 2018-02-23 DIAGNOSIS — Z794 Long term (current) use of insulin: Secondary | ICD-10-CM

## 2018-02-23 DIAGNOSIS — J910 Malignant pleural effusion: Secondary | ICD-10-CM

## 2018-02-23 DIAGNOSIS — Z7901 Long term (current) use of anticoagulants: Secondary | ICD-10-CM

## 2018-02-23 DIAGNOSIS — Z87440 Personal history of urinary (tract) infections: Secondary | ICD-10-CM

## 2018-02-23 DIAGNOSIS — C7931 Secondary malignant neoplasm of brain: Secondary | ICD-10-CM

## 2018-02-23 DIAGNOSIS — L97322 Non-pressure chronic ulcer of left ankle with fat layer exposed: Secondary | ICD-10-CM

## 2018-02-23 DIAGNOSIS — C7951 Secondary malignant neoplasm of bone: Secondary | ICD-10-CM

## 2018-02-23 DIAGNOSIS — C3412 Malignant neoplasm of upper lobe, left bronchus or lung: Principal | ICD-10-CM

## 2018-02-23 DIAGNOSIS — R339 Retention of urine, unspecified: Secondary | ICD-10-CM

## 2018-02-23 DIAGNOSIS — E1165 Type 2 diabetes mellitus with hyperglycemia: Secondary | ICD-10-CM

## 2018-02-23 DIAGNOSIS — L89150 Pressure ulcer of sacral region, unstageable: Secondary | ICD-10-CM

## 2018-02-23 DIAGNOSIS — D510 Vitamin B12 deficiency anemia due to intrinsic factor deficiency: Secondary | ICD-10-CM

## 2018-02-23 DIAGNOSIS — Z4801 Encounter for change or removal of surgical wound dressing: Secondary | ICD-10-CM

## 2018-02-23 DIAGNOSIS — M5135 Other intervertebral disc degeneration, thoracolumbar region: Secondary | ICD-10-CM

## 2018-02-23 DIAGNOSIS — Z9181 History of falling: Secondary | ICD-10-CM

## 2018-02-27 DIAGNOSIS — Z7901 Long term (current) use of anticoagulants: Secondary | ICD-10-CM

## 2018-02-27 DIAGNOSIS — R339 Retention of urine, unspecified: Secondary | ICD-10-CM

## 2018-02-27 DIAGNOSIS — C7951 Secondary malignant neoplasm of bone: Secondary | ICD-10-CM

## 2018-02-27 DIAGNOSIS — I4891 Unspecified atrial fibrillation: Secondary | ICD-10-CM

## 2018-02-27 DIAGNOSIS — C3412 Malignant neoplasm of upper lobe, left bronchus or lung: Principal | ICD-10-CM

## 2018-02-27 DIAGNOSIS — L89150 Pressure ulcer of sacral region, unstageable: Secondary | ICD-10-CM

## 2018-02-27 DIAGNOSIS — Z794 Long term (current) use of insulin: Secondary | ICD-10-CM

## 2018-02-27 DIAGNOSIS — E1165 Type 2 diabetes mellitus with hyperglycemia: Secondary | ICD-10-CM

## 2018-02-27 DIAGNOSIS — Z466 Encounter for fitting and adjustment of urinary device: Secondary | ICD-10-CM

## 2018-02-27 DIAGNOSIS — Z87440 Personal history of urinary (tract) infections: Secondary | ICD-10-CM

## 2018-02-27 DIAGNOSIS — L97322 Non-pressure chronic ulcer of left ankle with fat layer exposed: Secondary | ICD-10-CM

## 2018-02-27 DIAGNOSIS — C7931 Secondary malignant neoplasm of brain: Secondary | ICD-10-CM

## 2018-02-27 DIAGNOSIS — J910 Malignant pleural effusion: Secondary | ICD-10-CM

## 2018-02-27 DIAGNOSIS — I959 Hypotension, unspecified: Secondary | ICD-10-CM

## 2018-02-27 DIAGNOSIS — Z86711 Personal history of pulmonary embolism: Secondary | ICD-10-CM

## 2018-02-27 DIAGNOSIS — C787 Secondary malignant neoplasm of liver and intrahepatic bile duct: Secondary | ICD-10-CM

## 2018-02-27 DIAGNOSIS — Z4801 Encounter for change or removal of surgical wound dressing: Secondary | ICD-10-CM

## 2018-02-27 DIAGNOSIS — M5135 Other intervertebral disc degeneration, thoracolumbar region: Secondary | ICD-10-CM

## 2018-02-27 DIAGNOSIS — D510 Vitamin B12 deficiency anemia due to intrinsic factor deficiency: Secondary | ICD-10-CM

## 2018-02-27 DIAGNOSIS — Z9181 History of falling: Secondary | ICD-10-CM

## 2018-02-27 DIAGNOSIS — I10 Essential (primary) hypertension: Secondary | ICD-10-CM

## 2018-03-01 MED ORDER — COLLAGENASE CLOSTRIDIUM HISTOLYTICUM 250 UNIT/GRAM TOPICAL OINTMENT
Freq: Every day | TOPICAL | 0 refills | 0.00000 days | Status: CP
Start: 2018-03-01 — End: ?

## 2018-03-01 NOTE — Unmapped (Deleted)
Assessment/Plan:         Problem List Items Addressed This Visit     None          I have reviewed and addressed the patient???s adherence and response to prescribed medications. I have identified patient barriers to following the proposed medication and treatment plan, and have noted opportunities to optimize healthy behaviors. I have answered the patient???s questions to satisfaction and the patient voices understanding.      No follow-ups on file.       Subjective:            Patient ID: [name] [name] is a 85 y.o. female who presents for follow up of the following:  Informant: Patient came to appointment alone.    No chief complaint on file.    HPI    DM2  A1c at goal. Pt taking Metformin XR 1000 mg BID and Novolog 70/30 8u BID. Medication compliance has been an issue. At home BG readings between 400-500 before meals and 100-300 in the mornings when she first wakes up. She reports a decreased appetite. Gets routine meals but has to be forced to eat. Eats breakfast and dinner, usually skips lunch. Drinks water throughout the day.  Lab Results   Component Value Date    A1C 6.4 (H) 01/01/2018     Hypertension   Bp at goal. Soft pressures and fatigue reported in the past, likely multifactorial. Pt taking Lasix 20 mg qd prn for swelling. Bp meds being help since last hospitalization. Amlodipine 5 mg qd stopped in the past due to lack of improvement.  BP Readings from Last 3 Encounters:   02/27/18 130/70   02/20/18 90/58   02/15/18 90/54     Anemia  Pt stopped iron supplement due to poor appetite and bowel urgency per hem oncology.    ***    Last OV:  Cellulitis  Patient was admitted for LLE cellulitis. She is currently taking Keflex 500 mg TID x 5 days with mild improvement. Patient's son reports a spreading of redness. She restarted Lasix 20 mg this morning to reduce swelling. Patient is elevating legs throughout the day.  ??  Urinary retention  Patient followed with neurology for voiding trial and assessment of urinary retention. She has started urinating on her own and using catheter every other day per son. Denies blood in urine.    L knee laceration  Seen by ED on 11/08/2017 for fall. Pts son states that pt was using a walker and fell forward. Pts son states that the sutures on left knee broke and he put butterfly bandages over the wound.??Sutures removed today. No s/sx of infection. Has been cleaning with mild soap and water and applying neosporin.  ??  Sacral decubitus ulcer - progression since last seen here. They have not been rotating her during the day.    Chest pain  Pt reports chest pain from ED visit has resolved.   ??  Thoracic compression fracture  Pt reports having occassional back pain.She currently takes 1000mg  otc Tylenol and Salonpas for pain relief.   ??  Acute pulmonary embolism  Emboli in right upper lobe segmental and subsegmental arteries 04/30/17, taking apixaban BID.   ??  Metastatic lung cancer  Left upper lung 10cm mass found 04/30/17 with 2.7cm right upper lobe nodule. With mediastinal lymphadenopathy and hepatic lobe mass. Liver lesion biopsied 3/21 confirmed adenocarcinoma of lung.   - upcoming f/u for pleural effusion. Stop eliquis as recommend.  - No  cardiopulm complaints - admission sxs of CP/DOE have resolved    ROS  Comprehensive ROS negative except as per HPI    Outpatient Medications Prior to Visit   Medication Sig Dispense Refill   ??? ascorbic acid, vitamin C, (VITAMIN C) 500 MG tablet Take 1 tablet (500 mg total) by mouth daily. 30 tablet 11   ??? blood sugar diagnostic (GLUCOSE BLOOD) Strp Check blood sugar 4 times daily. 100 each 3   ??? blood sugar diagnostic Strp Check 4x/day as instructed 120 strip 5   ??? blood-glucose meter kit Use as instructed 1 each 0   ??? blood-glucose meter Misc 1 each by Miscellaneous route Four (4) times a day. for 1 day 1 each 1   ??? calcium carbonate-vitamin D3 (CALTRATE 600 + D) 600 mg (1,500 mg)-800 unit Chew Chew 1 tablet Two (2) times a day.     ??? collagenase (SANTYL) 250 unit/gram ointment Apply topically daily. To sacral wound 15 g 0   ??? crizotinib (XALKORI) 250 mg capsule Take 1 capsule (250 mg total) by mouth Two (2) times a day. 60 capsule 3   ??? ELIQUIS 5 mg Tab Take 5 mg by mouth Two (2) times a day. .  3   ??? ferrous sulfate 325 (65 FE) MG EC tablet Take 1 tablet (325 mg total) by mouth Two (2) times a day. 60 tablet 0   ??? furosemide (LASIX) 20 MG tablet Take 1 tablet (20 mg total) by mouth daily. 30 tablet 0   ??? insulin aspart protamine-insulin aspart (NOVOLOG MIX 70-30FLEXPEN U-100) 100 unit/mL (70-30) injection pen Inject 0.08 mL (8 Units total) under the skin two (2) times a day. 15 mL 0   ??? KLOR-CON M20 20 mEq tablet TAKE 1 TABLET BY MOUTH EVERY DAY 90 tablet 1   ??? lancets (ACCU-CHEK SOFTCLIX LANCETS) Misc Use 4 times daily. 100 each 3   ??? lancets Misc 1 each by Miscellaneous route Four (4) times a day. 200 each 4   ??? lidocaine 4 % patch Place 1 patch on the skin daily as needed (pain).     ??? MEDICAL SUPPLY ITEM Air cushion for stage IV pressure ulcer and high back recliner wheelchair 1 each 0   ??? metFORMIN (GLUCOPHAGE-XR) 500 MG 24 hr tablet TAKE 2 TABLETS (1,000 MG TOTAL) BY MOUTH TWO (2) TIMES A DAY. 360 tablet 3   ??? mirtazapine (REMERON) 7.5 MG tablet Take 1 tablet (7.5 mg total) by mouth nightly. 30 tablet 3   ??? nystatin (MYCOSTATIN) 100,000 unit/mL suspension Take 5 mL (500,000 Units total) by mouth Four (4) times a day. 280 mL 0   ??? pen needle, diabetic (BD ULTRA-FINE SHORT PEN NEEDLE) 31 gauge x 5/16 Ndle USE AS DIRECTED. USE 4 NEEDLES A DAY WITH DIABETIC MEDICATION PENS AS DIRECTED     ??? ranitidine (ZANTAC) 150 MG tablet Take 150 mg by mouth Two (2) times a day.      ??? sodium hypochlorite (DAKIN'S, QUARTER STRENGTH,) 0.125 % Soln Apply topically Two (2) times a day. 473 mL 0     No facility-administered medications prior to visit.        The following portions of the patient's history were reviewed and updated as appropriate: allergies, current medications, past medical history, past social history and problem list.          Objective:            Vital Signs  There were no vitals taken for this  visit.     Exam  General appearance: alert, cooperative, NAD   Head: normocephalic and atraumatic  Eyes: normal EOMs, PERRL, normal conjunctiva, no discharge  ENT: OP clear and moist, no tonsillar exudates or erythema, normal TMs and external ear canals, normal nasal passages  Neck: normal ROM, supple, no thyromegaly, no cervical/supraclavicular lymphadenopathy, no appreciable JVD  Lungs: normal work of breathing, good air entry and clear to auscultation bilaterally, no wheezes or crackles appreciated   Heart: regular rate and rhythm, S1, S2 normal, no murmur, click, rub or gallop   Abdomen: Abdomen soft, non-tender, non distended. No masses, no organomegaly. Bowel sounds present.  Back: no spinal or paraspinal tenderness, no CVA tenderness  Extremities: extremities normal, atraumatic, no cyanosis or edema. Pulses 2+  MSK: no joint tenderness or swelling  NEURO: AAOx3, appropriately responds to questions, spontaneous movement of extremities, strength and sensation intact bilaterally, no facial asymmetry or CN deficit  Psych: normal mood and affect  Skin: Warm and dry. No rashes or lesions.       I attest that I, Pollyann Savoy, personally documented this note while acting as scribe for [name], MD.      Pollyann Savoy, Scribe.  03/05/2018     The documentation recorded by the scribe accurately reflects the service I personally performed and the decisions made by me.    [name], MD

## 2018-03-01 NOTE — Unmapped (Signed)
[name] Refill Coordination Note    Specialty Medication(s) to be Shipped:   Hematology/Oncology: Cleotis Lema    Other medication(s) to be shipped: N/A     [name] [name], DOB: 16-Jan-1931  Phone: [phone] (home)       All above HIPAA information was verified with patient's family member.     Completed refill call assessment today to schedule patient's medication shipment from the [facility] [phone]).       Specialty medication(s) and dose(s) confirmed: Regimen is correct and unchanged.   Changes to medications: [name] [name] reports no changes reported at this time.  Changes to insurance: No  Questions for the pharmacist: No    The patient will receive a drug information handout for each medication shipped and additional FDA Medication Guides as required.      DISEASE/MEDICATION-SPECIFIC INFORMATION        N/A    ADHERENCE              MEDICARE PART B DOCUMENTATION     Xalkori 250mg : Patient has 5 days of medication on hand.    SHIPPING     Shipping address confirmed in Epic.     Delivery Scheduled: Yes, Expected medication delivery date: 03/05/18 via UPS or courier.     Medication will be delivered via Same Day Courier to the home address in [place].    [name] [name]   [facility] Specialty Technician

## 2018-03-02 DIAGNOSIS — J910 Malignant pleural effusion: Secondary | ICD-10-CM

## 2018-03-02 DIAGNOSIS — E1165 Type 2 diabetes mellitus with hyperglycemia: Secondary | ICD-10-CM

## 2018-03-02 DIAGNOSIS — I10 Essential (primary) hypertension: Secondary | ICD-10-CM

## 2018-03-02 DIAGNOSIS — Z86711 Personal history of pulmonary embolism: Secondary | ICD-10-CM

## 2018-03-02 DIAGNOSIS — Z9181 History of falling: Secondary | ICD-10-CM

## 2018-03-02 DIAGNOSIS — I959 Hypotension, unspecified: Secondary | ICD-10-CM

## 2018-03-02 DIAGNOSIS — D510 Vitamin B12 deficiency anemia due to intrinsic factor deficiency: Secondary | ICD-10-CM

## 2018-03-02 DIAGNOSIS — L97322 Non-pressure chronic ulcer of left ankle with fat layer exposed: Secondary | ICD-10-CM

## 2018-03-02 DIAGNOSIS — Z4801 Encounter for change or removal of surgical wound dressing: Secondary | ICD-10-CM

## 2018-03-02 DIAGNOSIS — C7931 Secondary malignant neoplasm of brain: Secondary | ICD-10-CM

## 2018-03-02 DIAGNOSIS — Z794 Long term (current) use of insulin: Secondary | ICD-10-CM

## 2018-03-02 DIAGNOSIS — L89150 Pressure ulcer of sacral region, unstageable: Secondary | ICD-10-CM

## 2018-03-02 DIAGNOSIS — C3412 Malignant neoplasm of upper lobe, left bronchus or lung: Principal | ICD-10-CM

## 2018-03-02 DIAGNOSIS — Z87440 Personal history of urinary (tract) infections: Secondary | ICD-10-CM

## 2018-03-02 DIAGNOSIS — I4891 Unspecified atrial fibrillation: Secondary | ICD-10-CM

## 2018-03-02 DIAGNOSIS — Z466 Encounter for fitting and adjustment of urinary device: Secondary | ICD-10-CM

## 2018-03-02 DIAGNOSIS — R339 Retention of urine, unspecified: Secondary | ICD-10-CM

## 2018-03-02 DIAGNOSIS — C787 Secondary malignant neoplasm of liver and intrahepatic bile duct: Secondary | ICD-10-CM

## 2018-03-02 DIAGNOSIS — C7951 Secondary malignant neoplasm of bone: Secondary | ICD-10-CM

## 2018-03-02 DIAGNOSIS — M5135 Other intervertebral disc degeneration, thoracolumbar region: Secondary | ICD-10-CM

## 2018-03-02 DIAGNOSIS — Z7901 Long term (current) use of anticoagulants: Secondary | ICD-10-CM

## 2018-03-02 MED ORDER — CRIZOTINIB 250 MG CAPSULE
ORAL_CAPSULE | Freq: Two times a day (BID) | ORAL | 3 refills | 0 days | Status: CP
Start: 2018-03-02 — End: ?
  Filled 2018-03-05: qty 60, 30d supply, fill #0

## 2018-03-02 NOTE — Unmapped (Signed)
Patient's son called in requesting refill of collagenase (SANTYL) 250 unit/gram ointment. Stated that she is almost out and really needs it for her wounds. Please advise. Date of last appointment was on 01/08/2018.

## 2018-03-05 MED FILL — XALKORI 250 MG CAPSULE: 30 days supply | Qty: 60 | Fill #0 | Status: AC

## 2018-03-07 DIAGNOSIS — Z86711 Personal history of pulmonary embolism: Secondary | ICD-10-CM

## 2018-03-07 DIAGNOSIS — D510 Vitamin B12 deficiency anemia due to intrinsic factor deficiency: Secondary | ICD-10-CM

## 2018-03-07 DIAGNOSIS — I4891 Unspecified atrial fibrillation: Secondary | ICD-10-CM

## 2018-03-07 DIAGNOSIS — C3412 Malignant neoplasm of upper lobe, left bronchus or lung: Principal | ICD-10-CM

## 2018-03-07 DIAGNOSIS — Z87440 Personal history of urinary (tract) infections: Secondary | ICD-10-CM

## 2018-03-07 DIAGNOSIS — C7951 Secondary malignant neoplasm of bone: Secondary | ICD-10-CM

## 2018-03-07 DIAGNOSIS — C787 Secondary malignant neoplasm of liver and intrahepatic bile duct: Secondary | ICD-10-CM

## 2018-03-07 DIAGNOSIS — I959 Hypotension, unspecified: Secondary | ICD-10-CM

## 2018-03-07 DIAGNOSIS — L97322 Non-pressure chronic ulcer of left ankle with fat layer exposed: Secondary | ICD-10-CM

## 2018-03-07 DIAGNOSIS — M5135 Other intervertebral disc degeneration, thoracolumbar region: Secondary | ICD-10-CM

## 2018-03-07 DIAGNOSIS — C7931 Secondary malignant neoplasm of brain: Secondary | ICD-10-CM

## 2018-03-07 DIAGNOSIS — J910 Malignant pleural effusion: Secondary | ICD-10-CM

## 2018-03-07 DIAGNOSIS — Z4801 Encounter for change or removal of surgical wound dressing: Secondary | ICD-10-CM

## 2018-03-07 DIAGNOSIS — R339 Retention of urine, unspecified: Secondary | ICD-10-CM

## 2018-03-07 DIAGNOSIS — I10 Essential (primary) hypertension: Secondary | ICD-10-CM

## 2018-03-07 DIAGNOSIS — E1165 Type 2 diabetes mellitus with hyperglycemia: Secondary | ICD-10-CM

## 2018-03-07 DIAGNOSIS — Z466 Encounter for fitting and adjustment of urinary device: Secondary | ICD-10-CM

## 2018-03-07 DIAGNOSIS — Z7901 Long term (current) use of anticoagulants: Secondary | ICD-10-CM

## 2018-03-07 DIAGNOSIS — L89150 Pressure ulcer of sacral region, unstageable: Secondary | ICD-10-CM

## 2018-03-07 DIAGNOSIS — Z9181 History of falling: Secondary | ICD-10-CM

## 2018-03-07 DIAGNOSIS — Z794 Long term (current) use of insulin: Secondary | ICD-10-CM

## 2018-03-15 NOTE — Unmapped (Signed)
Funeral home brought in death certif to be signed by pcp- gave to pt's pcp. Dr. Allena Katz. Will contact funeral home at [phone] once signed.

## 2018-03-19 ENCOUNTER — Ambulatory Visit: Payer: Medicare Other

## 2018-03-31 DEATH — deceased
